# Patient Record
Sex: Male | Born: 1955 | Race: White | Hispanic: No | Marital: Single | Smoking: Former smoker
Health system: Southern US, Community
[De-identification: ages and names within clinical notes are randomized; demographics above are authoritative.]

## PROBLEM LIST (undated history)

## (undated) DIAGNOSIS — IMO0002 Reserved for concepts with insufficient information to code with codable children: Secondary | ICD-10-CM

## (undated) DIAGNOSIS — E079 Disorder of thyroid, unspecified: Secondary | ICD-10-CM

## (undated) DIAGNOSIS — I10 Essential (primary) hypertension: Secondary | ICD-10-CM

## (undated) DIAGNOSIS — F319 Bipolar disorder, unspecified: Secondary | ICD-10-CM

## (undated) DIAGNOSIS — F200 Paranoid schizophrenia: Secondary | ICD-10-CM

## (undated) DIAGNOSIS — H269 Unspecified cataract: Secondary | ICD-10-CM

## (undated) DIAGNOSIS — E785 Hyperlipidemia, unspecified: Secondary | ICD-10-CM

## (undated) DIAGNOSIS — D649 Anemia, unspecified: Secondary | ICD-10-CM

## (undated) DIAGNOSIS — K219 Gastro-esophageal reflux disease without esophagitis: Secondary | ICD-10-CM

## (undated) DIAGNOSIS — R011 Cardiac murmur, unspecified: Secondary | ICD-10-CM

## (undated) DIAGNOSIS — C801 Malignant (primary) neoplasm, unspecified: Secondary | ICD-10-CM

## (undated) DIAGNOSIS — F29 Unspecified psychosis not due to a substance or known physiological condition: Secondary | ICD-10-CM

## (undated) HISTORY — DX: Gastro-esophageal reflux disease without esophagitis: K21.9

## (undated) HISTORY — DX: Anemia, unspecified: D64.9

## (undated) HISTORY — DX: Unspecified cataract: H26.9

## (undated) HISTORY — DX: Disorder of thyroid, unspecified: E07.9

## (undated) HISTORY — DX: Cardiac murmur, unspecified: R01.1

## (undated) HISTORY — DX: Essential (primary) hypertension: I10

## (undated) HISTORY — DX: Hyperlipidemia, unspecified: E78.5

---
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
## 1998-03-25 ENCOUNTER — Encounter: Admission: RE | Admit: 1998-03-25 | Discharge: 1998-03-25 | Payer: Self-pay | Admitting: Hematology and Oncology

## 1998-04-17 ENCOUNTER — Ambulatory Visit (HOSPITAL_COMMUNITY): Admission: RE | Admit: 1998-04-17 | Discharge: 1998-04-17 | Payer: Self-pay | Admitting: Dermatology

## 1998-07-31 ENCOUNTER — Encounter: Admission: RE | Admit: 1998-07-31 | Discharge: 1998-07-31 | Payer: Self-pay | Admitting: Internal Medicine

## 1998-12-19 ENCOUNTER — Encounter: Payer: Self-pay | Admitting: Internal Medicine

## 1998-12-19 ENCOUNTER — Encounter: Admission: RE | Admit: 1998-12-19 | Discharge: 1998-12-19 | Payer: Self-pay | Admitting: Internal Medicine

## 1998-12-19 ENCOUNTER — Ambulatory Visit (HOSPITAL_COMMUNITY): Admission: RE | Admit: 1998-12-19 | Discharge: 1998-12-19 | Payer: Self-pay | Admitting: Internal Medicine

## 1999-01-21 ENCOUNTER — Encounter: Admission: RE | Admit: 1999-01-21 | Discharge: 1999-01-21 | Payer: Self-pay | Admitting: Hematology and Oncology

## 1999-06-24 ENCOUNTER — Encounter: Admission: RE | Admit: 1999-06-24 | Discharge: 1999-06-24 | Payer: Self-pay | Admitting: Internal Medicine

## 1999-07-29 ENCOUNTER — Encounter: Admission: RE | Admit: 1999-07-29 | Discharge: 1999-07-29 | Payer: Self-pay | Admitting: Hematology and Oncology

## 1999-12-14 ENCOUNTER — Encounter: Payer: Self-pay | Admitting: *Deleted

## 1999-12-14 ENCOUNTER — Encounter: Admission: RE | Admit: 1999-12-14 | Discharge: 1999-12-14 | Payer: Self-pay | Admitting: *Deleted

## 1999-12-21 ENCOUNTER — Ambulatory Visit (HOSPITAL_COMMUNITY): Admission: RE | Admit: 1999-12-21 | Discharge: 1999-12-21 | Payer: Self-pay | Admitting: *Deleted

## 1999-12-21 ENCOUNTER — Encounter: Payer: Self-pay | Admitting: *Deleted

## 2000-11-01 ENCOUNTER — Ambulatory Visit (HOSPITAL_COMMUNITY): Admission: RE | Admit: 2000-11-01 | Discharge: 2000-11-01 | Payer: Self-pay | Admitting: Orthopedic Surgery

## 2000-11-01 ENCOUNTER — Encounter: Payer: Self-pay | Admitting: Orthopedic Surgery

## 2002-01-11 ENCOUNTER — Encounter: Admission: RE | Admit: 2002-01-11 | Discharge: 2002-01-11 | Payer: Self-pay | Admitting: *Deleted

## 2002-01-19 ENCOUNTER — Encounter: Admission: RE | Admit: 2002-01-19 | Discharge: 2002-01-19 | Payer: Self-pay | Admitting: *Deleted

## 2002-02-16 ENCOUNTER — Encounter: Admission: RE | Admit: 2002-02-16 | Discharge: 2002-02-16 | Payer: Self-pay | Admitting: *Deleted

## 2002-04-30 ENCOUNTER — Encounter: Admission: RE | Admit: 2002-04-30 | Discharge: 2002-04-30 | Payer: Self-pay | Admitting: *Deleted

## 2002-05-09 ENCOUNTER — Encounter: Payer: Self-pay | Admitting: Internal Medicine

## 2002-05-09 ENCOUNTER — Encounter: Admission: RE | Admit: 2002-05-09 | Discharge: 2002-05-09 | Payer: Self-pay | Admitting: Internal Medicine

## 2002-08-24 ENCOUNTER — Encounter: Admission: RE | Admit: 2002-08-24 | Discharge: 2002-08-24 | Payer: Self-pay | Admitting: *Deleted

## 2002-11-19 ENCOUNTER — Encounter: Admission: RE | Admit: 2002-11-19 | Discharge: 2002-11-19 | Payer: Self-pay | Admitting: *Deleted

## 2003-02-18 ENCOUNTER — Encounter: Admission: RE | Admit: 2003-02-18 | Discharge: 2003-02-18 | Payer: Self-pay | Admitting: *Deleted

## 2003-03-25 ENCOUNTER — Encounter: Admission: RE | Admit: 2003-03-25 | Discharge: 2003-03-25 | Payer: Self-pay | Admitting: *Deleted

## 2003-06-14 ENCOUNTER — Encounter: Admission: RE | Admit: 2003-06-14 | Discharge: 2003-06-14 | Payer: Self-pay | Admitting: *Deleted

## 2003-08-15 ENCOUNTER — Encounter: Admission: RE | Admit: 2003-08-15 | Discharge: 2003-08-15 | Payer: Self-pay | Admitting: *Deleted

## 2003-12-09 ENCOUNTER — Ambulatory Visit (HOSPITAL_COMMUNITY): Admission: RE | Admit: 2003-12-09 | Discharge: 2003-12-09 | Payer: Self-pay | Admitting: Orthopedic Surgery

## 2004-08-31 ENCOUNTER — Ambulatory Visit (HOSPITAL_COMMUNITY): Payer: Self-pay | Admitting: Psychiatry

## 2004-11-23 ENCOUNTER — Emergency Department (HOSPITAL_COMMUNITY): Admission: EM | Admit: 2004-11-23 | Discharge: 2004-11-23 | Payer: Self-pay | Admitting: Emergency Medicine

## 2004-11-24 ENCOUNTER — Ambulatory Visit (HOSPITAL_COMMUNITY): Payer: Self-pay | Admitting: Psychiatry

## 2004-11-30 ENCOUNTER — Ambulatory Visit (HOSPITAL_COMMUNITY): Payer: Self-pay | Admitting: Psychiatry

## 2004-12-14 ENCOUNTER — Ambulatory Visit (HOSPITAL_COMMUNITY): Payer: Self-pay | Admitting: Psychiatry

## 2005-01-08 ENCOUNTER — Ambulatory Visit (HOSPITAL_COMMUNITY): Payer: Self-pay | Admitting: Psychiatry

## 2005-02-08 ENCOUNTER — Ambulatory Visit (HOSPITAL_COMMUNITY): Payer: Self-pay | Admitting: Psychiatry

## 2005-02-15 ENCOUNTER — Encounter: Admission: RE | Admit: 2005-02-15 | Discharge: 2005-02-15 | Payer: Self-pay | Admitting: Internal Medicine

## 2005-04-09 ENCOUNTER — Ambulatory Visit (HOSPITAL_COMMUNITY): Payer: Self-pay | Admitting: Psychiatry

## 2005-06-16 ENCOUNTER — Ambulatory Visit (HOSPITAL_COMMUNITY): Payer: Self-pay | Admitting: Psychiatry

## 2005-06-23 ENCOUNTER — Ambulatory Visit (HOSPITAL_COMMUNITY): Payer: Self-pay | Admitting: Psychiatry

## 2005-07-23 ENCOUNTER — Ambulatory Visit (HOSPITAL_COMMUNITY): Payer: Self-pay | Admitting: Psychiatry

## 2005-08-04 ENCOUNTER — Encounter: Admission: RE | Admit: 2005-08-04 | Discharge: 2005-08-04 | Payer: Self-pay | Admitting: Internal Medicine

## 2005-08-27 ENCOUNTER — Ambulatory Visit (HOSPITAL_COMMUNITY): Payer: Self-pay | Admitting: Psychiatry

## 2005-10-11 ENCOUNTER — Ambulatory Visit (HOSPITAL_COMMUNITY): Payer: Self-pay | Admitting: Psychiatry

## 2005-12-15 ENCOUNTER — Ambulatory Visit (HOSPITAL_COMMUNITY): Payer: Self-pay | Admitting: Psychiatry

## 2006-01-12 ENCOUNTER — Ambulatory Visit (HOSPITAL_COMMUNITY): Payer: Self-pay | Admitting: Psychiatry

## 2006-03-09 ENCOUNTER — Ambulatory Visit (HOSPITAL_COMMUNITY): Payer: Self-pay | Admitting: Psychiatry

## 2006-04-20 ENCOUNTER — Ambulatory Visit (HOSPITAL_COMMUNITY): Payer: Self-pay | Admitting: Psychiatry

## 2006-05-25 ENCOUNTER — Ambulatory Visit (HOSPITAL_COMMUNITY): Payer: Self-pay | Admitting: Psychiatry

## 2006-07-25 ENCOUNTER — Ambulatory Visit (HOSPITAL_COMMUNITY): Payer: Self-pay | Admitting: Psychiatry

## 2006-09-19 ENCOUNTER — Ambulatory Visit (HOSPITAL_COMMUNITY): Payer: Self-pay | Admitting: Psychiatry

## 2006-10-19 ENCOUNTER — Ambulatory Visit (HOSPITAL_COMMUNITY): Payer: Self-pay | Admitting: Psychiatry

## 2006-12-12 ENCOUNTER — Ambulatory Visit (HOSPITAL_COMMUNITY): Payer: Self-pay | Admitting: Psychiatry

## 2007-02-13 ENCOUNTER — Ambulatory Visit (HOSPITAL_COMMUNITY): Payer: Self-pay | Admitting: Psychiatry

## 2007-03-15 ENCOUNTER — Ambulatory Visit (HOSPITAL_COMMUNITY): Payer: Self-pay | Admitting: Psychiatry

## 2007-05-12 ENCOUNTER — Ambulatory Visit (HOSPITAL_COMMUNITY): Payer: Self-pay | Admitting: Psychiatry

## 2007-07-14 ENCOUNTER — Ambulatory Visit (HOSPITAL_COMMUNITY): Payer: Self-pay | Admitting: Psychiatry

## 2007-09-15 ENCOUNTER — Ambulatory Visit (HOSPITAL_COMMUNITY): Payer: Self-pay | Admitting: Psychiatry

## 2007-12-04 ENCOUNTER — Ambulatory Visit (HOSPITAL_COMMUNITY): Payer: Self-pay | Admitting: Psychiatry

## 2008-01-03 ENCOUNTER — Ambulatory Visit (HOSPITAL_COMMUNITY): Payer: Self-pay | Admitting: Psychiatry

## 2008-02-05 ENCOUNTER — Ambulatory Visit (HOSPITAL_COMMUNITY): Payer: Self-pay | Admitting: Psychiatry

## 2008-02-27 ENCOUNTER — Encounter: Admission: RE | Admit: 2008-02-27 | Discharge: 2008-02-27 | Payer: Self-pay | Admitting: Orthopedic Surgery

## 2008-04-01 ENCOUNTER — Ambulatory Visit (HOSPITAL_COMMUNITY): Payer: Self-pay | Admitting: Psychiatry

## 2008-06-03 ENCOUNTER — Ambulatory Visit (HOSPITAL_COMMUNITY): Payer: Self-pay | Admitting: Psychiatry

## 2008-08-07 ENCOUNTER — Ambulatory Visit (HOSPITAL_COMMUNITY): Payer: Self-pay | Admitting: Psychiatry

## 2008-08-21 ENCOUNTER — Ambulatory Visit (HOSPITAL_COMMUNITY): Payer: Self-pay | Admitting: Psychiatry

## 2008-09-16 ENCOUNTER — Ambulatory Visit (HOSPITAL_COMMUNITY): Payer: Self-pay | Admitting: Psychiatry

## 2008-10-16 ENCOUNTER — Ambulatory Visit (HOSPITAL_COMMUNITY): Payer: Self-pay | Admitting: Psychiatry

## 2008-12-16 ENCOUNTER — Ambulatory Visit (HOSPITAL_COMMUNITY): Payer: Self-pay | Admitting: Psychiatry

## 2009-02-10 ENCOUNTER — Ambulatory Visit (HOSPITAL_COMMUNITY): Payer: Self-pay | Admitting: Psychiatry

## 2009-04-09 ENCOUNTER — Ambulatory Visit (HOSPITAL_COMMUNITY): Payer: Self-pay | Admitting: Psychiatry

## 2009-05-14 ENCOUNTER — Ambulatory Visit (HOSPITAL_COMMUNITY): Payer: Self-pay | Admitting: Psychiatry

## 2009-06-13 ENCOUNTER — Ambulatory Visit (HOSPITAL_COMMUNITY): Payer: Self-pay | Admitting: Psychiatry

## 2009-07-07 ENCOUNTER — Encounter: Admission: RE | Admit: 2009-07-07 | Discharge: 2009-07-07 | Payer: Self-pay | Admitting: Orthopedic Surgery

## 2009-08-18 ENCOUNTER — Ambulatory Visit (HOSPITAL_COMMUNITY): Payer: Self-pay | Admitting: Psychiatry

## 2009-10-17 ENCOUNTER — Ambulatory Visit (HOSPITAL_COMMUNITY): Payer: Self-pay | Admitting: Psychiatry

## 2009-12-12 ENCOUNTER — Ambulatory Visit (HOSPITAL_COMMUNITY): Payer: Self-pay | Admitting: Psychiatry

## 2010-02-23 ENCOUNTER — Ambulatory Visit (HOSPITAL_COMMUNITY): Payer: Self-pay | Admitting: Psychiatry

## 2010-04-20 ENCOUNTER — Ambulatory Visit (HOSPITAL_COMMUNITY): Payer: Self-pay | Admitting: Psychiatry

## 2010-04-23 ENCOUNTER — Encounter: Admission: RE | Admit: 2010-04-23 | Discharge: 2010-04-23 | Payer: Self-pay | Admitting: Internal Medicine

## 2010-05-25 ENCOUNTER — Ambulatory Visit: Payer: Self-pay | Admitting: Psychiatry

## 2010-05-25 ENCOUNTER — Other Ambulatory Visit: Payer: Self-pay

## 2010-05-25 ENCOUNTER — Inpatient Hospital Stay (HOSPITAL_COMMUNITY): Admission: AD | Admit: 2010-05-25 | Discharge: 2010-05-29 | Payer: Self-pay | Admitting: Psychiatry

## 2010-06-26 ENCOUNTER — Ambulatory Visit (HOSPITAL_COMMUNITY): Payer: Self-pay | Admitting: Psychiatry

## 2010-08-12 ENCOUNTER — Ambulatory Visit (HOSPITAL_COMMUNITY): Payer: Self-pay | Admitting: Psychiatry

## 2010-08-24 ENCOUNTER — Ambulatory Visit (HOSPITAL_COMMUNITY): Payer: Self-pay | Admitting: Psychiatry

## 2010-11-16 ENCOUNTER — Ambulatory Visit (HOSPITAL_COMMUNITY): Payer: Self-pay | Admitting: Psychiatry

## 2010-12-19 ENCOUNTER — Encounter: Payer: Self-pay | Admitting: Orthopedic Surgery

## 2010-12-20 ENCOUNTER — Encounter: Payer: Self-pay | Admitting: Internal Medicine

## 2010-12-30 ENCOUNTER — Encounter (HOSPITAL_COMMUNITY): Payer: Self-pay | Admitting: Psychiatry

## 2010-12-30 ENCOUNTER — Ambulatory Visit (HOSPITAL_COMMUNITY): Admit: 2010-12-30 | Payer: Self-pay | Admitting: Psychiatry

## 2011-01-05 ENCOUNTER — Encounter (HOSPITAL_COMMUNITY): Payer: Self-pay

## 2011-01-25 ENCOUNTER — Encounter (HOSPITAL_COMMUNITY): Payer: Medicare Other | Admitting: Psychiatry

## 2011-01-25 DIAGNOSIS — F259 Schizoaffective disorder, unspecified: Secondary | ICD-10-CM

## 2011-02-14 LAB — CBC
MCH: 31.6 pg (ref 26.0–34.0)
MCHC: 35.2 g/dL (ref 30.0–36.0)
MCV: 89.8 fL (ref 78.0–100.0)
Platelets: 136 10*3/uL — ABNORMAL LOW (ref 150–400)
RBC: 3.83 MIL/uL — ABNORMAL LOW (ref 4.22–5.81)

## 2011-02-14 LAB — HEPATIC FUNCTION PANEL
ALT: 15 U/L (ref 0–53)
Alkaline Phosphatase: 57 U/L (ref 39–117)
Bilirubin, Direct: 0.1 mg/dL (ref 0.0–0.3)
Indirect Bilirubin: 0.3 mg/dL (ref 0.3–0.9)
Total Protein: 6.9 g/dL (ref 6.0–8.3)

## 2011-02-14 LAB — BASIC METABOLIC PANEL
BUN: 21 mg/dL (ref 6–23)
BUN: 27 mg/dL — ABNORMAL HIGH (ref 6–23)
CO2: 25 mEq/L (ref 19–32)
Calcium: 9.5 mg/dL (ref 8.4–10.5)
Chloride: 110 mEq/L (ref 96–112)
Creatinine, Ser: 2.04 mg/dL — ABNORMAL HIGH (ref 0.4–1.5)
Creatinine, Ser: 2.13 mg/dL — ABNORMAL HIGH (ref 0.4–1.5)
GFR calc Af Amer: 39 mL/min — ABNORMAL LOW (ref >60)
GFR calc Af Amer: 41 mL/min — ABNORMAL LOW (ref >60)
GFR calc non Af Amer: 32 mL/min — ABNORMAL LOW (ref >60)
Glucose, Bld: 95 mg/dL (ref 70–99)
Potassium: 4.3 mEq/L (ref 3.5–5.1)

## 2011-02-14 LAB — DIFFERENTIAL
Basophils Relative: 1 % (ref 0–1)
Eosinophils Absolute: 0.1 10*3/uL (ref 0.0–0.7)
Eosinophils Relative: 1 % (ref 0–5)
Lymphs Abs: 2.1 10*3/uL (ref 0.7–4.0)
Neutrophils Relative %: 62 % (ref 43–77)

## 2011-02-14 LAB — TSH: TSH: 3.791 u[IU]/mL (ref 0.350–4.500)

## 2011-02-14 LAB — VALPROIC ACID LEVEL: Valproic Acid Lvl: 57.6 ug/mL (ref 50.0–100.0)

## 2011-02-14 LAB — RAPID URINE DRUG SCREEN, HOSP PERFORMED
Amphetamines: NOT DETECTED
Barbiturates: NOT DETECTED
Benzodiazepines: NOT DETECTED
Cocaine: NOT DETECTED
Opiates: NOT DETECTED
Tetrahydrocannabinol: NOT DETECTED

## 2011-02-14 LAB — PLATELET COUNT: Platelets: 109 10*3/uL — ABNORMAL LOW (ref 150–400)

## 2011-05-04 ENCOUNTER — Other Ambulatory Visit: Payer: Self-pay | Admitting: Internal Medicine

## 2011-05-04 DIAGNOSIS — R609 Edema, unspecified: Secondary | ICD-10-CM

## 2011-05-05 ENCOUNTER — Ambulatory Visit
Admission: RE | Admit: 2011-05-05 | Discharge: 2011-05-05 | Disposition: A | Discharge status: Home / Self Care | Payer: Medicare Other | Source: Ambulatory Visit | Attending: Internal Medicine | Admitting: Internal Medicine

## 2011-05-05 DIAGNOSIS — R609 Edema, unspecified: Secondary | ICD-10-CM

## 2012-04-08 ENCOUNTER — Emergency Department (HOSPITAL_COMMUNITY)
Admission: EM | Admit: 2012-04-08 | Discharge: 2012-04-08 | Disposition: A | Discharge status: Home / Self Care | Payer: Medicare Other | Attending: Emergency Medicine | Admitting: Emergency Medicine

## 2012-04-08 ENCOUNTER — Encounter (HOSPITAL_COMMUNITY): Payer: Self-pay | Admitting: Emergency Medicine

## 2012-04-08 DIAGNOSIS — I4510 Unspecified right bundle-branch block: Secondary | ICD-10-CM | POA: Insufficient documentation

## 2012-04-08 DIAGNOSIS — Z87891 Personal history of nicotine dependence: Secondary | ICD-10-CM | POA: Insufficient documentation

## 2012-04-08 DIAGNOSIS — I498 Other specified cardiac arrhythmias: Secondary | ICD-10-CM | POA: Insufficient documentation

## 2012-04-08 DIAGNOSIS — R0789 Other chest pain: Secondary | ICD-10-CM | POA: Insufficient documentation

## 2012-04-08 LAB — COMPREHENSIVE METABOLIC PANEL
ALT: 11 U/L (ref 0–53)
AST: 13 U/L (ref 0–37)
Albumin: 3.4 g/dL — ABNORMAL LOW (ref 3.5–5.2)
Alkaline Phosphatase: 67 U/L (ref 39–117)
Potassium: 4 mEq/L (ref 3.5–5.1)
Sodium: 144 mEq/L (ref 135–145)
Total Protein: 6.5 g/dL (ref 6.0–8.3)

## 2012-04-08 LAB — DIFFERENTIAL
Basophils Absolute: 0 10*3/uL (ref 0.0–0.1)
Basophils Relative: 1 % (ref 0–1)
Eosinophils Absolute: 0.2 10*3/uL (ref 0.0–0.7)
Neutro Abs: 3 10*3/uL (ref 1.7–7.7)
Neutrophils Relative %: 53 % (ref 43–77)

## 2012-04-08 LAB — CBC
MCH: 31.1 pg (ref 26.0–34.0)
MCHC: 33.5 g/dL (ref 30.0–36.0)
Platelets: 80 10*3/uL — ABNORMAL LOW (ref 150–400)

## 2012-04-08 LAB — TROPONIN I: Troponin I: <0.3 ng/mL (ref <0.30)

## 2012-04-08 NOTE — ED Notes (Signed)
Discharged with brother as driving, instructions given, NAD at time of the discharge.

## 2012-04-08 NOTE — ED Notes (Signed)
Patient complains of in the center of chest, Denies nausea or vomiting. Skin is warm and dry.

## 2012-04-08 NOTE — Discharge Instructions (Signed)
Chest Pain (Nonspecific) It is often hard to give a specific diagnosis for the cause of chest pain. There is always a chance that your pain could be related to something serious, such as a heart attack or a blood clot in the lungs. You need to follow up with your caregiver for further evaluation. CAUSES   Heartburn.   Pneumonia or bronchitis.   Anxiety or stress.   Inflammation around your heart (pericarditis) or lung (pleuritis or pleurisy).   A blood clot in the lung.   A collapsed lung (pneumothorax). It can develop suddenly on its own (spontaneous pneumothorax) or from injury (trauma) to the chest.   Shingles infection (herpes zoster virus).  The chest wall is composed of bones, muscles, and cartilage. Any of these can be the source of the pain.  The bones can be bruised by injury.   The muscles or cartilage can be strained by coughing or overwork.   The cartilage can be affected by inflammation and become sore (costochondritis).  DIAGNOSIS  Lab tests or other studies, such as X-rays, electrocardiography, stress testing, or cardiac imaging, may be needed to find the cause of your pain.  TREATMENT   Treatment depends on what may be causing your chest pain. Treatment may include:   Acid blockers for heartburn.   Anti-inflammatory medicine.   Pain medicine for inflammatory conditions.   Antibiotics if an infection is present.   You may be advised to change lifestyle habits. This includes stopping smoking and avoiding alcohol, caffeine, and chocolate.   You may be advised to keep your head raised (elevated) when sleeping. This reduces the chance of acid going backward from your stomach into your esophagus.   Most of the time, nonspecific chest pain will improve within 2 to 3 days with rest and mild pain medicine.  HOME CARE INSTRUCTIONS   If antibiotics were prescribed, take your antibiotics as directed. Finish them even if you start to feel better.   For the next few  days, avoid physical activities that bring on chest pain. Continue physical activities as directed.   Do not smoke.   Avoid drinking alcohol.   Only take over-the-counter or prescription medicine for pain, discomfort, or fever as directed by your caregiver.   Follow your caregiver's suggestions for further testing if your chest pain does not go away.   Keep any follow-up appointments you made. If you do not go to an appointment, you could develop lasting (chronic) problems with pain. If there is any problem keeping an appointment, you must call to reschedule.  SEEK MEDICAL CARE IF:   You think you are having problems from the medicine you are taking. Read your medicine instructions carefully.   Your chest pain does not go away, even after treatment.   You develop a rash with blisters on your chest.  SEEK IMMEDIATE MEDICAL CARE IF:   You have increased chest pain or pain that spreads to your arm, neck, jaw, back, or abdomen.   You develop shortness of breath, an increasing cough, or you are coughing up blood.   You have severe back or abdominal pain, feel nauseous, or vomit.   You develop severe weakness, fainting, or chills.   You have a fever.  THIS IS AN EMERGENCY. Do not wait to see if the pain will go away. Get medical help at once. Call your local emergency services (911 in [place] Do not drive yourself to the hospital. MAKE SURE YOU:   Understand these instructions.     Will watch your condition.   Will get help right away if you are not doing well or get worse.  Document Released: 08/25/2005 Document Revised: 11/04/2011 Document Reviewed: 06/20/2008 Exit[employer] Patient Information 2012 [employer]. 

## 2012-04-08 NOTE — ED Provider Notes (Signed)
History     CSN: [id number]  Arrival date & time 04/08/12  1110   First MD Initiated Contact with Patient 04/08/12 1132      Chief Complaint  Patient presents with  . Chest Pain    (Consider location/radiation/quality/duration/timing/severity/associated sxs/prior treatment) HPI Comments: Patient for eval of an episode of chest discomfort that occurred yesterday after eating.  He was sitting at the table and had a 30 minute episode of tightness without radiation, diaphoresis, shortness of breath.  He presents today for this and also because he feels tired all the time.  He is on multiple psych meds for schizophrenia but these have not been changed recently.  Patient is a 56 y.o. male presenting with chest pain. The history is provided by the patient.  Chest Pain The chest pain began yesterday. Duration of episode(s) is 30 minutes. Chest pain occurs constantly. The chest pain is resolved. The pain is associated with eating. The pain is currently at 0/10. The severity of the pain is moderate. The quality of the pain is described as tightness. The pain does not radiate. Chest pain is worsened by eating. Pertinent negatives for primary symptoms include no fever, no shortness of breath, no cough, no nausea and no vomiting. He tried nothing for the symptoms. Risk factors: family history.     No past medical history on file.  Past Surgical History  Procedure Date  . Dermal abrasion 1973    No family history on file.  History  Substance Use Topics  . Smoking status: Former Smoker    Quit date: 04/09/1991  . Smokeless tobacco: Not on file  . Alcohol Use: No      Review of Systems  Constitutional: Negative for fever.  Respiratory: Negative for cough and shortness of breath.   Cardiovascular: Positive for chest pain.  Gastrointestinal: Negative for nausea and vomiting.  All other systems reviewed and are negative.    Allergies  Review of patient's allergies indicates not on  file.  Home Medications  No current outpatient prescriptions on file.  BP 106/89  Pulse 50  Temp(Src) 97.5 F (36.4 C) (Oral)  Resp 16  SpO2 100%  Physical Exam  Nursing note and vitals reviewed. Constitutional: He is oriented to person, place, and time. He appears well-developed and well-nourished. No distress.  HENT:  Head: Normocephalic and atraumatic.  Mouth/Throat: Oropharynx is clear and moist.  Neck: Normal range of motion. Neck supple.  Cardiovascular: Normal rate and regular rhythm.   No murmur heard. Pulmonary/Chest: Effort normal and breath sounds normal. No respiratory distress. He has no wheezes.  Abdominal: Soft. Bowel sounds are normal. He exhibits no distension. There is no tenderness.  Musculoskeletal: Normal range of motion. He exhibits no edema.  Neurological: He is alert and oriented to person, place, and time.  Skin: Skin is warm and dry. He is not diaphoretic.    ED Course  Procedures (including critical care time)  Labs Reviewed - No data to display No results found.   No diagnosis found.   Date: 04/08/2012  Rate: 59  Rhythm: sinus bradycardia  QRS Axis: normal  Intervals: normal  ST/T Wave abnormalities: normal  Conduction Disutrbances:right bundle branch block  Narrative Interpretation:   Old EKG Reviewed: none available    MDM  The labs, ekg, and history are reassuring.  He will be discharged to home, return prn if worsens.          Geoffery Lyons, MD 04/08/12 [phone]

## 2013-10-15 ENCOUNTER — Ambulatory Visit: Payer: Self-pay | Admitting: Podiatry

## 2014-04-16 ENCOUNTER — Ambulatory Visit: Payer: Self-pay | Admitting: Podiatry

## 2014-05-09 ENCOUNTER — Ambulatory Visit: Payer: Self-pay | Admitting: Podiatry

## 2014-06-12 ENCOUNTER — Encounter (HOSPITAL_COMMUNITY): Payer: Self-pay | Admitting: Emergency Medicine

## 2014-06-12 ENCOUNTER — Emergency Department (HOSPITAL_COMMUNITY)
Admission: EM | Admit: 2014-06-12 | Discharge: 2014-06-13 | Disposition: A | Discharge status: Other Acute Inpatient Hospital | Payer: Medicare Other | Source: Home / Self Care | Attending: Emergency Medicine | Admitting: Emergency Medicine

## 2014-06-12 DIAGNOSIS — Z792 Long term (current) use of antibiotics: Secondary | ICD-10-CM | POA: Insufficient documentation

## 2014-06-12 DIAGNOSIS — F200 Paranoid schizophrenia: Secondary | ICD-10-CM | POA: Insufficient documentation

## 2014-06-12 DIAGNOSIS — Z79899 Other long term (current) drug therapy: Secondary | ICD-10-CM | POA: Insufficient documentation

## 2014-06-12 DIAGNOSIS — F919 Conduct disorder, unspecified: Secondary | ICD-10-CM | POA: Insufficient documentation

## 2014-06-12 DIAGNOSIS — Z8739 Personal history of other diseases of the musculoskeletal system and connective tissue: Secondary | ICD-10-CM | POA: Insufficient documentation

## 2014-06-12 DIAGNOSIS — F259 Schizoaffective disorder, unspecified: Secondary | ICD-10-CM | POA: Diagnosis not present

## 2014-06-12 DIAGNOSIS — F29 Unspecified psychosis not due to a substance or known physiological condition: Secondary | ICD-10-CM | POA: Diagnosis present

## 2014-06-12 DIAGNOSIS — Z87891 Personal history of nicotine dependence: Secondary | ICD-10-CM | POA: Insufficient documentation

## 2014-06-12 DIAGNOSIS — F411 Generalized anxiety disorder: Secondary | ICD-10-CM

## 2014-06-12 DIAGNOSIS — IMO0002 Reserved for concepts with insufficient information to code with codable children: Secondary | ICD-10-CM | POA: Insufficient documentation

## 2014-06-12 HISTORY — DX: Reserved for concepts with insufficient information to code with codable children: IMO0002

## 2014-06-12 HISTORY — DX: Paranoid schizophrenia: F20.0

## 2014-06-12 HISTORY — DX: Unspecified psychosis not due to a substance or known physiological condition: F29

## 2014-06-12 LAB — CBC
HCT: 35.4 % — ABNORMAL LOW (ref 39.0–52.0)
HEMOGLOBIN: 11.7 g/dL — AB (ref 13.0–17.0)
MCH: 30.4 pg (ref 26.0–34.0)
MCHC: 33.1 g/dL (ref 30.0–36.0)
MCV: 91.9 fL (ref 78.0–100.0)
Platelets: 102 10*3/uL — ABNORMAL LOW (ref 150–400)
RBC: 3.85 MIL/uL — AB (ref 4.22–5.81)
RDW: 14.6 % (ref 11.5–15.5)
WBC: 4.3 10*3/uL (ref 4.0–10.5)

## 2014-06-12 LAB — RAPID URINE DRUG SCREEN, HOSP PERFORMED
AMPHETAMINES: NOT DETECTED
Barbiturates: NOT DETECTED
Benzodiazepines: NOT DETECTED
Cocaine: NOT DETECTED
OPIATES: NOT DETECTED
Tetrahydrocannabinol: NOT DETECTED

## 2014-06-12 LAB — COMPREHENSIVE METABOLIC PANEL
ALT: 27 U/L (ref 0–53)
ANION GAP: 15 (ref 5–15)
AST: 29 U/L (ref 0–37)
Albumin: 3.6 g/dL (ref 3.5–5.2)
Alkaline Phosphatase: 103 U/L (ref 39–117)
BUN: 16 mg/dL (ref 6–23)
CALCIUM: 9.7 mg/dL (ref 8.4–10.5)
CO2: 18 meq/L — AB (ref 19–32)
CREATININE: 2.38 mg/dL — AB (ref 0.50–1.35)
Chloride: 109 mEq/L (ref 96–112)
GFR, EST AFRICAN AMERICAN: 32 mL/min — AB (ref >90)
GFR, EST NON AFRICAN AMERICAN: 28 mL/min — AB (ref >90)
GLUCOSE: 126 mg/dL — AB (ref 70–99)
Potassium: 4.3 mEq/L (ref 3.7–5.3)
SODIUM: 142 meq/L (ref 137–147)
TOTAL PROTEIN: 7.3 g/dL (ref 6.0–8.3)
Total Bilirubin: 0.3 mg/dL (ref 0.3–1.2)

## 2014-06-12 LAB — AMMONIA: AMMONIA: 50 umol/L (ref 11–60)

## 2014-06-12 LAB — BASIC METABOLIC PANEL
ANION GAP: 11 (ref 5–15)
BUN: 16 mg/dL (ref 6–23)
CO2: 22 mEq/L (ref 19–32)
Calcium: 8.8 mg/dL (ref 8.4–10.5)
Chloride: 112 mEq/L (ref 96–112)
Creatinine, Ser: 2.28 mg/dL — ABNORMAL HIGH (ref 0.50–1.35)
GFR calc Af Amer: 34 mL/min — ABNORMAL LOW (ref >90)
GFR, EST NON AFRICAN AMERICAN: 29 mL/min — AB (ref >90)
Glucose, Bld: 90 mg/dL (ref 70–99)
Potassium: 3.9 mEq/L (ref 3.7–5.3)
SODIUM: 145 meq/L (ref 137–147)

## 2014-06-12 LAB — SALICYLATE LEVEL

## 2014-06-12 LAB — I-STAT CG4 LACTIC ACID, ED
LACTIC ACID, VENOUS: 6.48 mmol/L — AB (ref 0.5–2.2)
LACTIC ACID, VENOUS: 0.73 mmol/L (ref 0.5–2.2)

## 2014-06-12 LAB — VALPROIC ACID LEVEL: VALPROIC ACID LVL: 20.7 ug/mL — AB (ref 50.0–100.0)

## 2014-06-12 LAB — ACETAMINOPHEN LEVEL: Acetaminophen (Tylenol), Serum: <15 ug/mL (ref 10–30)

## 2014-06-12 LAB — ETHANOL

## 2014-06-12 MED ORDER — ZOLPIDEM TARTRATE 5 MG PO TABS: 5.0000 mg | ORAL_TABLET | Freq: Every evening | ORAL | Status: DC | PRN

## 2014-06-12 MED ORDER — FLUOXETINE HCL 20 MG PO TABS
80.0000 mg | ORAL_TABLET | Freq: Every day | ORAL | Status: DC
  Administered 2014-06-13: 80 mg via ORAL
  Filled 2014-06-12: qty 4

## 2014-06-12 MED ORDER — ADULT MULTIVITAMIN W/MINERALS CH
1.0000 | ORAL_TABLET | Freq: Every day | ORAL | Status: DC
  Administered 2014-06-12 – 2014-06-13 (×2): 1 via ORAL
  Filled 2014-06-12 (×2): qty 1

## 2014-06-12 MED ORDER — POLYETHYLENE GLYCOL 3350 17 GM/SCOOP PO POWD
17.0000 g | Freq: Every day | ORAL | Status: DC
  Filled 2014-06-12: qty 255

## 2014-06-12 MED ORDER — ASENAPINE MALEATE 5 MG SL SUBL
5.0000 mg | SUBLINGUAL_TABLET | SUBLINGUAL | Status: DC
  Administered 2014-06-12 – 2014-06-13 (×3): 5 mg via SUBLINGUAL
  Filled 2014-06-12 (×4): qty 1

## 2014-06-12 MED ORDER — DIVALPROEX SODIUM ER 500 MG PO TB24
500.0000 mg | ORAL_TABLET | ORAL | Status: DC
  Administered 2014-06-13: 500 mg via ORAL
  Filled 2014-06-12: qty 1

## 2014-06-12 MED ORDER — POLYETHYLENE GLYCOL 3350 17 G PO PACK
17.0000 g | PACK | Freq: Every day | ORAL | Status: DC
  Filled 2014-06-12: qty 1

## 2014-06-12 MED ORDER — ACETAMINOPHEN 325 MG PO TABS
650.0000 mg | ORAL_TABLET | ORAL | Status: DC | PRN
  Administered 2014-06-12 – 2014-06-13 (×4): 650 mg via ORAL
  Filled 2014-06-12 (×4): qty 2

## 2014-06-12 MED ORDER — FUROSEMIDE 40 MG PO TABS: 20.0000 mg | ORAL_TABLET | Freq: Two times a day (BID) | ORAL | Status: DC

## 2014-06-12 MED ORDER — LEVOTHYROXINE SODIUM 50 MCG PO TABS
50.0000 ug | ORAL_TABLET | Freq: Every day | ORAL | Status: DC
  Administered 2014-06-13: 50 ug via ORAL
  Filled 2014-06-12 (×2): qty 1

## 2014-06-12 MED ORDER — NICOTINE 21 MG/24HR TD PT24: 21.0000 mg | MEDICATED_PATCH | Freq: Every day | TRANSDERMAL | Status: DC

## 2014-06-12 MED ORDER — SPIRONOLACTONE 25 MG PO TABS
25.0000 mg | ORAL_TABLET | Freq: Every day | ORAL | Status: DC
  Administered 2014-06-13: 25 mg via ORAL
  Filled 2014-06-12: qty 1

## 2014-06-12 MED ORDER — FLUOXETINE HCL 20 MG PO TABS
40.0000 mg | ORAL_TABLET | Freq: Every day | ORAL | Status: DC
Start: 2014-06-12 — End: 2014-06-12
  Filled 2014-06-12: qty 2

## 2014-06-12 MED ORDER — SIMVASTATIN 20 MG PO TABS
20.0000 mg | ORAL_TABLET | Freq: Every day | ORAL | Status: DC
  Administered 2014-06-12: 20 mg via ORAL
  Filled 2014-06-12 (×2): qty 1

## 2014-06-12 MED ORDER — SODIUM CHLORIDE 0.9 % IV BOLUS (SEPSIS)
2000.0000 mL | Freq: Once | INTRAVENOUS | Status: AC
  Administered 2014-06-12: 2000 mL via INTRAVENOUS

## 2014-06-12 MED ORDER — LABETALOL HCL 100 MG PO TABS
100.0000 mg | ORAL_TABLET | Freq: Every day | ORAL | Status: DC
  Filled 2014-06-12: qty 1

## 2014-06-12 MED ORDER — DIVALPROEX SODIUM ER 500 MG PO TB24
1000.0000 mg | ORAL_TABLET | Freq: Every day | ORAL | Status: DC
  Administered 2014-06-12: 1000 mg via ORAL
  Filled 2014-06-12 (×2): qty 2

## 2014-06-12 MED ORDER — ONDANSETRON HCL 4 MG PO TABS: 4.0000 mg | ORAL_TABLET | Freq: Three times a day (TID) | ORAL | Status: DC | PRN

## 2014-06-12 MED ORDER — LABETALOL HCL 100 MG PO TABS
100.0000 mg | ORAL_TABLET | Freq: Two times a day (BID) | ORAL | Status: DC
  Administered 2014-06-12 – 2014-06-13 (×2): 100 mg via ORAL
  Filled 2014-06-12 (×3): qty 1

## 2014-06-12 MED ORDER — LATANOPROST 0.005 % OP SOLN
1.0000 [drp] | Freq: Every day | OPHTHALMIC | Status: DC
  Administered 2014-06-12: 1 [drp] via OPHTHALMIC
  Filled 2014-06-12 (×2): qty 2.5
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
## 2014-06-12 NOTE — ED Notes (Signed)
Brandon Maynard, EDP given CG4 Lactic results.

## 2014-06-12 NOTE — ED Notes (Signed)
CG4 Lactic results given to [facility].

## 2014-06-12 NOTE — ED Notes (Signed)
Pt's brothers: Brandon Maynard [phone] Brandon Maynard [phone]  Pt's brother, Brandon Maynard, called ED, states that pt's mother is pt's legal guardian but lives in nursing home and has dementia. Pt recently evicted from home, they are working with social work to find placement in group home for when pt is discharged. Dean Leinberger asked [place] to call him if we have any questions or concerns regarding pt.
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
## 2014-06-12 NOTE — ED Notes (Signed)
Pt has tan pants and a button down shirt
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
## 2014-06-12 NOTE — ED Notes (Signed)
Blood work to be pulled back from IV 30 minutes after fluids ended, per Dr Regenia Skeeter.

## 2014-06-12 NOTE — ED Notes (Signed)
Pt has 1 bag of belongings in locker 27
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
## 2014-06-12 NOTE — ED Notes (Signed)
Pt to go to room 41

## 2014-06-12 NOTE — ED Notes (Signed)
MD at bedside. EDP [place] PRESENT TO EVALUATE PT

## 2014-06-13 ENCOUNTER — Inpatient Hospital Stay (HOSPITAL_COMMUNITY)
Admission: AD | Admit: 2014-06-13 | Discharge: 2014-06-28 | DRG: 885 | Disposition: A | Discharge status: Home / Self Care | Payer: Medicare Other | Source: Intra-hospital | Attending: Psychiatry | Admitting: Psychiatry

## 2014-06-13 ENCOUNTER — Encounter (HOSPITAL_COMMUNITY): Payer: Self-pay | Admitting: General Practice

## 2014-06-13 ENCOUNTER — Encounter (HOSPITAL_COMMUNITY): Payer: Self-pay | Admitting: Registered Nurse

## 2014-06-13 DIAGNOSIS — F250 Schizoaffective disorder, bipolar type: Secondary | ICD-10-CM

## 2014-06-13 DIAGNOSIS — F259 Schizoaffective disorder, unspecified: Principal | ICD-10-CM | POA: Diagnosis present

## 2014-06-13 DIAGNOSIS — F29 Unspecified psychosis not due to a substance or known physiological condition: Secondary | ICD-10-CM | POA: Diagnosis present

## 2014-06-13 DIAGNOSIS — K219 Gastro-esophageal reflux disease without esophagitis: Secondary | ICD-10-CM | POA: Diagnosis present

## 2014-06-13 DIAGNOSIS — Z9119 Patient's noncompliance with other medical treatment and regimen: Secondary | ICD-10-CM | POA: Diagnosis not present

## 2014-06-13 DIAGNOSIS — G4700 Insomnia, unspecified: Secondary | ICD-10-CM | POA: Diagnosis present

## 2014-06-13 DIAGNOSIS — F411 Generalized anxiety disorder: Secondary | ICD-10-CM | POA: Diagnosis present

## 2014-06-13 DIAGNOSIS — F200 Paranoid schizophrenia: Secondary | ICD-10-CM | POA: Diagnosis present

## 2014-06-13 DIAGNOSIS — F319 Bipolar disorder, unspecified: Secondary | ICD-10-CM | POA: Diagnosis present

## 2014-06-13 DIAGNOSIS — Z87891 Personal history of nicotine dependence: Secondary | ICD-10-CM

## 2014-06-13 DIAGNOSIS — Z91199 Patient's noncompliance with other medical treatment and regimen due to unspecified reason: Secondary | ICD-10-CM

## 2014-06-13 HISTORY — DX: Bipolar disorder, unspecified: F31.9

## 2014-06-13 HISTORY — DX: Unspecified psychosis not due to a substance or known physiological condition: F29

## 2014-06-13 MED ORDER — LEVOTHYROXINE SODIUM 50 MCG PO TABS
50.0000 ug | ORAL_TABLET | Freq: Every day | ORAL | Status: DC
  Administered 2014-06-14 – 2014-06-28 (×15): 50 ug via ORAL
  Filled 2014-06-13 (×7): qty 1
  Filled 2014-06-13: qty 2
  Filled 2014-06-13 (×9): qty 1

## 2014-06-13 MED ORDER — LABETALOL HCL 100 MG PO TABS
100.0000 mg | ORAL_TABLET | Freq: Two times a day (BID) | ORAL | Status: DC
  Administered 2014-06-13 – 2014-06-28 (×30): 100 mg via ORAL
  Filled 2014-06-13 (×33): qty 1

## 2014-06-13 MED ORDER — OLANZAPINE 10 MG PO TBDP
ORAL_TABLET | ORAL | Status: AC
  Administered 2014-06-13: 10 mg via ORAL
  Filled 2014-06-13: qty 1

## 2014-06-13 MED ORDER — ALUM & MAG HYDROXIDE-SIMETH 200-200-20 MG/5ML PO SUSP: 30.0000 mL | ORAL | Status: DC | PRN

## 2014-06-13 MED ORDER — DIVALPROEX SODIUM ER 500 MG PO TB24
1000.0000 mg | ORAL_TABLET | Freq: Every day | ORAL | Status: DC
  Filled 2014-06-13 (×3): qty 2

## 2014-06-13 MED ORDER — HALOPERIDOL LACTATE 5 MG/ML IJ SOLN
5.0000 mg | Freq: Four times a day (QID) | INTRAMUSCULAR | Status: DC | PRN
  Filled 2014-06-13: qty 1

## 2014-06-13 MED ORDER — ADULT MULTIVITAMIN W/MINERALS CH
1.0000 | ORAL_TABLET | Freq: Every day | ORAL | Status: DC
  Administered 2014-06-14 – 2014-06-28 (×15): 1 via ORAL
  Filled 2014-06-13 (×16): qty 1

## 2014-06-13 MED ORDER — ZIPRASIDONE MESYLATE 20 MG IM SOLR
20.0000 mg | Freq: Three times a day (TID) | INTRAMUSCULAR | Status: DC | PRN
  Administered 2014-06-17: 20 mg via INTRAMUSCULAR
  Filled 2014-06-13: qty 20

## 2014-06-13 MED ORDER — SIMVASTATIN 20 MG PO TABS
20.0000 mg | ORAL_TABLET | Freq: Every day | ORAL | Status: DC
  Administered 2014-06-14 – 2014-06-27 (×14): 20 mg via ORAL
  Filled 2014-06-13 (×16): qty 1

## 2014-06-13 MED ORDER — SPIRONOLACTONE 25 MG PO TABS
25.0000 mg | ORAL_TABLET | Freq: Every day | ORAL | Status: DC
  Administered 2014-06-14 – 2014-06-28 (×15): 25 mg via ORAL
  Filled 2014-06-13 (×16): qty 1

## 2014-06-13 MED ORDER — NICOTINE 21 MG/24HR TD PT24
21.0000 mg | MEDICATED_PATCH | Freq: Every day | TRANSDERMAL | Status: DC
  Filled 2014-06-13 (×16): qty 1

## 2014-06-13 MED ORDER — MAGNESIUM HYDROXIDE 400 MG/5ML PO SUSP
30.0000 mL | Freq: Every day | ORAL | Status: DC | PRN
  Administered 2014-06-22: 30 mL via ORAL

## 2014-06-13 MED ORDER — TRAZODONE HCL 50 MG PO TABS
50.0000 mg | ORAL_TABLET | Freq: Every evening | ORAL | Status: DC | PRN
  Administered 2014-06-13 – 2014-06-17 (×5): 50 mg via ORAL
  Filled 2014-06-13 (×4): qty 1

## 2014-06-13 MED ORDER — DIVALPROEX SODIUM ER 500 MG PO TB24
500.0000 mg | ORAL_TABLET | ORAL | Status: DC
  Filled 2014-06-13: qty 1

## 2014-06-13 MED ORDER — DIPHENHYDRAMINE HCL 50 MG/ML IJ SOLN
50.0000 mg | Freq: Three times a day (TID) | INTRAMUSCULAR | Status: DC | PRN
  Administered 2014-06-17: 50 mg via INTRAMUSCULAR
  Filled 2014-06-13: qty 1

## 2014-06-13 MED ORDER — ACETAMINOPHEN 325 MG PO TABS
650.0000 mg | ORAL_TABLET | Freq: Four times a day (QID) | ORAL | Status: DC | PRN
  Administered 2014-06-13 – 2014-06-28 (×29): 650 mg via ORAL
  Filled 2014-06-13 (×28): qty 2

## 2014-06-13 MED ORDER — LATANOPROST 0.005 % OP SOLN
1.0000 [drp] | Freq: Every day | OPHTHALMIC | Status: DC
  Administered 2014-06-14 – 2014-06-27 (×11): 1 [drp] via OPHTHALMIC
  Filled 2014-06-13 (×3): qty 2.5

## 2014-06-13 MED ORDER — LORAZEPAM 2 MG/ML IJ SOLN
2.0000 mg | Freq: Four times a day (QID) | INTRAMUSCULAR | Status: DC | PRN
  Filled 2014-06-13: qty 1

## 2014-06-13 MED ORDER — ASENAPINE MALEATE 5 MG SL SUBL
5.0000 mg | SUBLINGUAL_TABLET | SUBLINGUAL | Status: DC
  Administered 2014-06-14 – 2014-06-23 (×19): 5 mg via SUBLINGUAL
  Filled 2014-06-13 (×23): qty 1

## 2014-06-13 MED ORDER — DIVALPROEX SODIUM ER 500 MG PO TB24
500.0000 mg | ORAL_TABLET | Freq: Once | ORAL | Status: AC
  Administered 2014-06-13: 500 mg via ORAL
  Filled 2014-06-13: qty 1

## 2014-06-13 MED ORDER — OLANZAPINE 10 MG PO TBDP
10.0000 mg | ORAL_TABLET | Freq: Three times a day (TID) | ORAL | Status: DC | PRN
  Administered 2014-06-13 – 2014-06-27 (×10): 10 mg via ORAL
  Filled 2014-06-13 (×10): qty 1

## 2014-06-13 MED ORDER — FLUOXETINE HCL 20 MG PO TABS
80.0000 mg | ORAL_TABLET | Freq: Every day | ORAL | Status: DC
  Administered 2014-06-14: 80 mg via ORAL
  Filled 2014-06-13 (×2): qty 4

## 2014-06-13 NOTE — Progress Notes (Signed)
Patient was escorted by [facility] to [facility]. Patient was calm and cooperative at transfer. Patient is stable at transfer. Belongings were sent with [employer].

## 2014-06-13 NOTE — Tx Team (Signed)
Initial Interdisciplinary Treatment Plan  PATIENT STRENGTHS: (choose at least two) General fund of knowledge Supportive family/friends  PATIENT STRESSORS: Legal issue   PROBLEM LIST: Problem List/Patient Goals Date to be addressed Date deferred Reason deferred Estimated date of resolution  Psychosis 06/13/2014                                                      DISCHARGE CRITERIA:  Improved stabilization in mood, thinking, and/or behavior Safe-care adequate arrangements made  PRELIMINARY DISCHARGE PLAN: Outpatient therapy  PATIENT/FAMIILY INVOLVEMENT: This treatment plan has been presented to and reviewed with the patient, Brandon Maynard.  The patient and family have been given the opportunity to ask questions and make suggestions.  Gaylan Gerold E 06/13/2014, 6:58 PM

## 2014-06-13 NOTE — Progress Notes (Signed)
Patient ID: Brandon Maynard, male   DOB: Dec 15, 1955, 58 y.o.   MRN: [id number]  Patient is a 58 year old caucasian male admitted to [facility] IVC for erratic behavior and bizarre behavior. Patient was witnessed throwing furniture and dishes out of his home almost hit other residents. Patient was also heard screaming and disturbing other residents. Patient has a past medical history of Paranoid Schizophrenia and Degenerative Disc Disease. Patient denies SI/HI and A/V hallucinations. Patient's mood is labile but can be redirected. Patient's BP has been elevated but may in some aspects have been altered due to patient's agitation. Patient verbalized understanding of the admission process. Patient had no belongings to go in a locker at this time. Patient was oriented to the unit and was shown where the phone was so he could call his brother. Q15 minutes were initiated and are maintained. Patient is safe at this time.

## 2014-06-13 NOTE — Consult Note (Signed)
  Patient states "I was brought here yesterday.  I am paranoid schizophrenic.  My brother is power of attorney and he is looking for a group home for me.  I have been so frustrated cause no one has called yet.  My plans have been in the air for 4 months."  Patient IVC by apartment manager related to erratic and unpredictable behavior and yelling in his home disturbing other residents.    Agree with TTS assessment Recommend inpatient treatment Patient has been accepted to [name] 402/01 Will monitor for safety and stabilization until transferred. Home medications started.   Patient seen for a face-to-face psychiatric evaluation and examination along with the physician assistant, case discussed and formulated at appropriate treatment plan.Reviewed the information documented and agree with the treatment plan.  Brandon Maynard,Brandon R. 06/13/2014 5:24 PM

## 2014-06-13 NOTE — ED Notes (Signed)
Patient appears restless. Appears to be responding to internal stimuli. Observed pacing room and hallway on unit. Continues to drink water, put water in his hair, and go back and forth to the bathroom. Patient declined Ambien.  Encouragement offered. Given Tylenol.  Q 15 safety checks continue.

## 2014-06-13 NOTE — BHH Counselor (Addendum)
When writer approaches pt to let him know he has been accepted to [facility], pt becomes irate. He screams at Probation officer that "the doctor promised he would come talk to me and my brother" and that pt hasn't seen MD. Pt then yells at writer that he is going to throw his cup of soda on Probation officer. Pt then yells, "You can suck my dick, you damn bitch!". Pt told he cannot speak to staff in that manner. Pt then yelling about not having any shoes. Per pt's RN, moments prior pt had been happy when told he had a bed at [facility].    Per Debarah Crape [facility], pt accepted to bed 402-1.  Arnold Long, [place] Assessment Counselor 3:12 pm

## 2014-06-14 ENCOUNTER — Encounter (HOSPITAL_COMMUNITY): Payer: Self-pay | Admitting: Psychiatry

## 2014-06-14 MED ORDER — DIVALPROEX SODIUM ER 500 MG PO TB24
1000.0000 mg | ORAL_TABLET | Freq: Two times a day (BID) | ORAL | Status: DC
  Administered 2014-06-14 – 2014-06-17 (×7): 1000 mg via ORAL
  Filled 2014-06-14 (×11): qty 2

## 2014-06-14 MED ORDER — FLUOXETINE HCL 20 MG PO TABS
40.0000 mg | ORAL_TABLET | Freq: Every day | ORAL | Status: DC
  Administered 2014-06-15 – 2014-06-28 (×14): 40 mg via ORAL
  Filled 2014-06-14: qty 2
  Filled 2014-06-14: qty 6
  Filled 2014-06-14 (×9): qty 2
  Filled 2014-06-14: qty 6
  Filled 2014-06-14 (×4): qty 2

## 2014-06-14 MED ORDER — PANTOPRAZOLE SODIUM 40 MG PO TBEC
40.0000 mg | DELAYED_RELEASE_TABLET | Freq: Every day | ORAL | Status: DC
  Administered 2014-06-14 – 2014-06-28 (×15): 40 mg via ORAL
  Filled 2014-06-14 (×18): qty 1

## 2014-06-14 MED ORDER — PANTOPRAZOLE SODIUM 40 MG PO TBEC: 40.0000 mg | DELAYED_RELEASE_TABLET | Freq: Every day | ORAL | Status: DC

## 2014-06-14 MED ORDER — TUBERCULIN PPD 5 UNIT/0.1ML ID SOLN
5.0000 [IU] | Freq: Once | INTRADERMAL | Status: AC
  Administered 2014-06-14: 5 [IU] via INTRADERMAL

## 2014-06-14 NOTE — BHH Counselor (Signed)
Adult Comprehensive Assessment  Patient ID: Brandon Maynard, male   DOB: 09-Sep-1956, 58 y.o.   MRN: [id number]  Information Source: Information source: Patient  Current Stressors:  Employment / Job issues: Gets Engineer, building services / Lack of resources (include bankruptcy): Fixed income Housing / Lack of housing: Family is trying to get him into placement  Living/Environment/Situation:  Living Arrangements: Alone Living conditions (as described by patient or guardian): Good, except recently they have not been taking care of the plumbing How long has patient lived in current situation?: 7 years What is atmosphere in current home: Chaotic  Family History:  Marital status: Single Does patient have children?: No  Childhood History:  By whom was/is the patient raised?: Both parents Description of patient's relationship with caregiver when they were a child: good Patient's description of current relationship with people who raised him/her: father deceased,  mother in rehab after a fall  Says she has psychosis Does patient have siblings?: Yes Number of Siblings: 2 Description of patient's current relationship with siblings: good Did patient suffer any verbal/emotional/physical/sexual abuse as a child?: No Did patient suffer from severe childhood neglect?: No Has patient ever been sexually abused/assaulted/raped as an adolescent or adult?: No Was the patient ever a victim of a crime or a disaster?: No Witnessed domestic violence?: No Has patient been effected by domestic violence as an adult?: No  Education:  Highest grade of school patient has completed: 2 years of college Currently a student?: No Learning disability?: No  Employment/Work Situation:   Employment situation: On disability Why is patient on disability: mental health How long has patient been on disability: mid 19's Patient's job has been impacted by current illness: No What is the longest time patient has a held a job?: 5  years 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 Has patient ever been in the [employer]?: No Has patient ever served in Recruitment consultant?: No  Financial Resources:   Museum/gallery curator resources: Teacher, early years/pre Does patient have a Programmer, applications or guardian?: No  Alcohol/Substance Abuse:   Alcohol/Substance Abuse Treatment Hx: Denies past history Has alcohol/substance abuse ever caused legal problems?: No  Social Support System:   Heritage manager System: Fair Astronomer System: brothers Type of faith/religion: "Grew up [employer]"  Leisure/Recreation:   Leisure and Hobbies: Watch movies  Strengths/Needs:   What things does the patient do well?: Watch movies In what areas does patient struggle / problems for patient: "My chemical imbalance"  Discharge Plan:   Does patient have access to transportation?: Yes Will patient be returning to same living situation after discharge?: No Plan for living situation after discharge: Supervised living Currently receiving community mental health services: Yes (From Whom) [facility]) Does patient have financial barriers related to discharge medications?: No Patient description of barriers related to discharge medications:      Summary/Recommendations:   Summary and Recommendations (to be completed by the evaluator): Brandon Maynard is a 58 YO Caucasian male who, by his own admission, has been diagnosed with schizophrenia and has decompensated due to medication non-compliance and stress of moving out of apartment.  He can benefit from crises stabilization, medication management, theraputic milieu and referral for services.  Brandon Lias B. 06/14/2014

## 2014-06-14 NOTE — Progress Notes (Signed)
Pt talkative and cooperative. Pt c/o headache and backache. Pt is active and participates in groups. RN gave Tylenol PRN for pain.  Pt remains safe and appropriate in milieu.

## 2014-06-14 NOTE — BHH Suicide Risk Assessment (Signed)
   Nursing information obtained from:  Patient Demographic factors:  Male;Caucasian;Living alone Current Mental Status:  NA Loss Factors:  NA Historical Factors:  Impulsivity Risk Reduction Factors:  Sense of responsibility to family Total Time spent with patient: 30 minutes  CLINICAL FACTORS:   Severe Anxiety and/or Agitation Bipolar Disorder:   Mixed State Depression:   Aggression Delusional Hopelessness Impulsivity Schizophrenia:   Paranoid or undifferentiated type Currently Psychotic  Psychiatric Specialty Exam: Physical Exam  Psychiatric: His affect is angry and labile. His speech is rapid and/or pressured. He is agitated, aggressive and actively hallucinating. Thought content is paranoid and delusional. Cognition and memory are normal. He expresses impulsivity.    Review of Systems  Constitutional: Negative.   HENT: Negative.   Eyes: Negative.   Respiratory: Negative.   Cardiovascular: Negative.   Gastrointestinal: Negative.   Genitourinary: Negative.   Musculoskeletal: Negative.   Skin: Negative.   Neurological: Negative.   Endo/Heme/Allergies: Negative.   Psychiatric/Behavioral: Positive for hallucinations. The patient is nervous/anxious and has insomnia.     Blood pressure 148/81, pulse 72, temperature 98.2 F (36.8 C), temperature source Oral, resp. rate 20, height 6\' 2"  (1.88 m), weight 108.863 kg (240 lb).Body mass index is 30.8 kg/(m^2).  General Appearance: Disheveled  Eye Sport and exercise psychologist::  Fair  Speech:  Pressured  Volume:  Increased  Mood:  Irritable  Affect:  Labile  Thought Process:  Circumstantial and Disorganized  Orientation:  Full (Time, Place, and Person)  Thought Content:  Paranoid Ideation  Suicidal Thoughts:  No  Homicidal Thoughts:  No  Memory:  Immediate;   Fair Recent;   Fair Remote;   Fair  Judgement:  Impaired  Insight:  Lacking  Psychomotor Activity:  Increased  Concentration:  Fair  Recall:  [employer] of Knowledge:Fair  Language:  Good  Akathisia:  No  Handed:  Right  AIMS (if indicated):     Assets:  Communication Skills Desire for Improvement Physical Health  Sleep:  Number of Hours: 4.75   Musculoskeletal: Strength & Muscle Tone: within normal limits Gait & Station: normal Patient leans: N/A  COGNITIVE FEATURES THAT CONTRIBUTE TO RISK:  Closed-mindedness Polarized thinking    SUICIDE RISK:   Minimal: No identifiable suicidal ideation.  Patients presenting with no risk factors but with morbid ruminations; may be classified as minimal risk based on the severity of the depressive symptoms  PLAN OF CARE:1. Admit for crisis management and stabilization. 2. Medication management to reduce current symptoms to base line and improve the  patient's overall level of functioning 3. Treat health problems as indicated. 4. Develop treatment plan to decrease risk of relapse upon discharge and the need for  readmission. 5. Psycho-social education regarding relapse prevention and self care. 6. Health care follow up as needed for medical problems. 7. Restart home medications where appropriate.   I certify that inpatient services furnished can reasonably be expected to improve the patient's condition.  Corena Pilgrim, MD 06/14/2014, 10:04 AM

## 2014-06-14 NOTE — Progress Notes (Addendum)
D: Pt is flat in affect and labile in mood. Pt was pleasantly polite during our initial interaction. Pt presented disorganized in his thought process.Pt denied any SI/HI/AVH. Pt endorses having anxiety in relation to his experience of being in police custody. Pt became irritate during this writer's attempt to give him his Depakote. Pt was adamant about receiving #2 500 mg of Depakote ER. Pt was informed that he his previous dose was charted for 500 mg and that it could had been #2 250 mg. Pt demanded that I give him the pills in his hand and that he would manage what he is going to take. Pt became verbally aggressive as he demanded for me to leave. On-call Doctor was contacted for the possibility to administer half the dose as requested by the patient. Writer also requested to have something as needed for agitation. A: Writer administered scheduled and prn medications to pt. Continued support and availability as needed was extended to this pt. Staff continue to monitor pt with q21min checks.  R: No adverse drug reactions noted. Pt willingly received 500 mg of Depakote and 10 mg of ZYDIS, that was administered by a male Diplomatic Services operational officer. No further of aggression resulted from this pt. Pt remains safe at this time.

## 2014-06-14 NOTE — BHH Group Notes (Signed)
[facility] LCSW Aftercare Discharge Planning Group Note   06/14/2014 12:34 PM  Participation Quality:  Did not attend    [place]

## 2014-06-14 NOTE — BHH Group Notes (Signed)
[facility] LCSW Group Therapy  06/14/2014  1:05 PM  Type of Therapy:  Group therapy  Participation Level:  Active  Participation Quality:  Attentive  Affect:  Flat  Cognitive:  Oriented  Insight:  Limited  Engagement in Therapy:  Limited  Modes of Intervention:  Discussion, Socialization  Summary of Progress/Problems:  Chaplain was here to lead a group on themes of hope and courage.  "I have a cousin named Rod.  Haven't seen him for 40 years.  Who si this new staff?  Will you introduce him to me?"  After an initial barrage, we were able to get Brandon Maynard to take a breath and back off.  "Courage is fortitude, Air traffic controller.  Animals are loyal if you don't mistreat them.  My brother used to have a hound dog." "I'm a simple man with simple needs.  I hope to talk to my brothers.  Eat some food.  Enjoy the sunset." [name] 06/14/2014 12:34 PM

## 2014-06-14 NOTE — Progress Notes (Signed)
[facility] Group Notes:  (Nursing/MHT/Case Management/Adjunct)  Date:  06/14/2014  Time:  9:15 PM  Type of Therapy:  Group Therapy  Participation Level:  Active  Participation Quality:  Intrusive, Redirectable and Sharing  Affect:  Appropriate, Excited and Irritable  Cognitive:  Alert  Insight:  Improving  Engagement in Group:  Developing/Improving, Engaged and Off Topic  Modes of Intervention:  Socialization and Support  Summary of Progress/Problems: Pt. Rated his energy level a 5.  Pt. Stated lack of sleep is an early warning sign for relapse.  Pt. Listed listening to music as a coping skill.  Pt. Stated his brother as a strong part of his support system.  Lanell Persons 06/14/2014, 9:15 PM

## 2014-06-14 NOTE — Progress Notes (Signed)
5 unit intradermal tuberculin injection administered in left forearm at 1709 per order. To be read on 06/16/14 at 1709. Patient advised that test was requested by group home prior to patient's discharge from [facility].

## 2014-06-14 NOTE — Tx Team (Signed)
  Interdisciplinary Treatment Plan Update   Date Reviewed:  06/14/2014  Time Reviewed:  8:11 AM  Progress in Treatment:   Attending groups: Yes Participating in groups: Yes Taking medication as prescribed: Yes  Tolerating medication: Yes Family/Significant other contact made: No  Patient understands diagnosis: Yes AEB asking for help with symptoms of schizophrenia Discussing patient identified problems/goals with staff: Yes See initial care plan Medical problems stabilized or resolved: Yes Denies suicidal/homicidal ideation: Yes In tx team Patient has not harmed self or others: Yes  For review of initial/current patient goals, please see plan of care.  Estimated Length of Stay:  4-5 days  Reason for Continuation of Hospitalization: Mania Medication stabilization Other; describe Mood lability  New Problems/Goals identified:  N/A  Discharge Plan or Barriers:   return home, follow up [facility]  Additional Comments:  Brandon Maynard is an 58 y.o. male. Patient was brought into the ED by [employer] under IVC initated by his apartment manager. According to the IVC the patient has become more erratic and unpredictable. He been yelling in his home daily disturbing other residents. Today, he was witnessed throwing dishware and furniture out of his apartment almost hitting other residents. Patient admits to the previously stated behaviors as an outburst because of him being overwhelmed. Patient reports that his brothers have decided to put him in a group because his ability to live on his own has changed. Patient currently denies SI/HI, hallucinations, or other self-injurious behaviors.    Attendees:  Signature: Corena Pilgrim, MD 06/14/2014 8:11 AM   Signature: Ripley Fraise, LCSW 06/14/2014 8:11 AM  Signature: Elmarie Shiley, NP 06/14/2014 8:11 AM  Signature: Mayra Neer, RN 06/14/2014 8:11 AM  Signature: Darrol Angel, RN 06/14/2014 8:11 AM  Signature:  06/14/2014 8:11 AM  Signature:   06/14/2014 8:11 AM   Signature:    Signature:    Signature:    Signature:    Signature:    Signature:      Scribe for Treatment Team:   Ripley Fraise, LCSW  06/14/2014 8:11 AM
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
## 2014-06-15 NOTE — Progress Notes (Addendum)
[facility] MD Progress Note  06/15/2014 10:48 AM Brandon Maynard  MRN:  [id number] Subjective:  Patient states he is feeling better Objective: Patient seen with RN, chart reviewed. Patient is a 58 year old man, who was admitted on IVC yesterday, with a report that he has agitated, disorganized in behavior, and becoming unpredictable, yelling, so that  [place] and Radiographer, therapeutic were concerned. Family reports that medication compliance is intermittent. Patient has a history of paranoid schizophrenia. Today, patient states he is feeling better, and states he slept well last night. On unit  He has been calm, generally pleasant, not agitated. He has been going to groups, at times somewhat intrusive in groups, as per notes, but redirectable and pleasant. At this time denies any medication side effects    Diagnosis:  Schizoaffective disorder, bipolar type    Total Time spent with patient: 20 minutes   ADL's:  Fair   Sleep: Improving  Appetite: Fair   Suicidal Ideation:  Denies  Homicidal Ideation:  Denies  AEB (as evidenced by):  Psychiatric Specialty Exam: Physical Exam  ROS  Blood pressure 120/69, pulse 77, temperature 98.2 F (36.8 C), temperature source Oral, resp. rate 18, height 6\' 2"  (1.88 m), weight 108.863 kg (240 lb).Body mass index is 30.8 kg/(m^2).  General Appearance: Fairly Groomed  Engineer, water::  Good  Speech:  Normal Rate  Volume:  Normal  Mood:  "OK", denies significant depression  Affect:  Appropriate  Thought Process:  Disorganized, subtly tangential, disorganized at times   Orientation:  NA- alert and attentive  Thought Content:  Currently is denying any hallucinations, and at this time does not appear internally preoccupied   Suicidal Thoughts:  No- at this time denies any suicidal ideations  Homicidal Thoughts:  No  Memory:  NA  Judgement:  Fair  Insight:  Lacking  Psychomotor Activity:  Normal  Concentration:  Good  Recall:  Good  Fund of Knowledge:Good   Language: Good  Akathisia:  Negative  Handed:  Right  AIMS (if indicated):     Assets:  Communication Skills Desire for Improvement Resilience  Sleep:  Number of Hours: 6   Musculoskeletal: Strength & Muscle Tone: within normal limits Gait & Station: normal Patient leans: N/A  Current Medications: Current Facility-Administered Medications  Medication Dose Route Frequency Provider Last Rate Last Dose  . acetaminophen (TYLENOL) tablet 650 mg  650 mg Oral Q6H PRN Mojeed Akintayo   650 mg at 06/14/14 2242  . alum & mag hydroxide-simeth (MAALOX/MYLANTA) 200-200-20 MG/5ML suspension 30 mL  30 mL Oral Q4H PRN Shuvon Rankin, NP      . asenapine (SAPHRIS) sublingual tablet 5 mg  5 mg Sublingual 2 times per day Shuvon Rankin, NP   5 mg at 06/15/14 0807  . diphenhydrAMINE (BENADRYL) injection 50 mg  50 mg Intramuscular Q8H PRN Mojeed Akintayo      . divalproex (DEPAKOTE ER) 24 hr tablet 1,000 mg  1,000 mg Oral BID PC Mojeed Akintayo   1,000 mg at 06/15/14 0807  . FLUoxetine (PROZAC) tablet 40 mg  40 mg Oral Daily Mojeed Akintayo   40 mg at 06/15/14 0807  . labetalol (NORMODYNE) tablet 100 mg  100 mg Oral BID Shuvon Rankin, NP   100 mg at 06/15/14 0808  . latanoprost (XALATAN) 0.005 % ophthalmic solution 1 drop  1 drop Both Eyes QHS Shuvon Rankin, NP   1 drop at 06/14/14 2244  . levothyroxine (SYNTHROID, LEVOTHROID) tablet 50 mcg  50 mcg Oral QAC breakfast Shuvon  Rankin, NP   50 mcg at 06/15/14 [phone]  . magnesium hydroxide (MILK OF MAGNESIA) suspension 30 mL  30 mL Oral Daily PRN Shuvon Rankin, NP      . multivitamin with minerals tablet 1 tablet  1 tablet Oral Daily Shuvon Rankin, NP   1 tablet at 06/15/14 0807  . nicotine (NICODERM CQ - dosed in mg/24 hours) patch 21 mg  21 mg Transdermal Daily Shuvon Rankin, NP      . OLANZapine zydis (ZYPREXA) disintegrating tablet 10 mg  10 mg Oral Q8H PRN Mojeed Akintayo   10 mg at 06/14/14 2243  . pantoprazole (PROTONIX) EC tablet 40 mg  40 mg Oral Daily  Mojeed Akintayo   40 mg at 06/15/14 0806  . simvastatin (ZOCOR) tablet 20 mg  20 mg Oral q1800 Shuvon Rankin, NP   20 mg at 06/14/14 1836  . spironolactone (ALDACTONE) tablet 25 mg  25 mg Oral Daily Shuvon Rankin, NP   25 mg at 06/15/14 0808  . traZODone (DESYREL) tablet 50 mg  50 mg Oral QHS PRN Shuvon Rankin, NP   50 mg at 06/14/14 2234  . tuberculin injection 5 Units  5 Units Intradermal Once Elmarie Shiley, NP   5 Units at 06/14/14 1709  . ziprasidone (GEODON) injection 20 mg  20 mg Intramuscular Q8H PRN Mojeed Akintayo        Lab Results: No results found for this or any previous visit (from the past 63 hour(s)).  Physical Findings: AIMS: Facial and Oral Movements Muscles of Facial Expression: None, normal Lips and Perioral Area: None, normal Jaw: None, normal Tongue: None, normal,Extremity Movements Upper (arms, wrists, hands, fingers): None, normal Lower (legs, knees, ankles, toes): None, normal, Trunk Movements Neck, shoulders, hips: None, normal, Overall Severity Severity of abnormal movements (highest score from questions above): None, normal Incapacitation due to abnormal movements: None, normal Patient's awareness of abnormal movements (rate only patient's report): No Awareness, Dental Status Current problems with teeth and/or dentures?: No Does patient usually wear dentures?: No  CIWA:    COWS:     Assessment: Patient's behavior on unit generally calm and pleasant thus far. No overt psychotic symptoms, but does present somewhat disorganized in thought process and insight into events that resulted in his admission seems limited. Tolerating medications well, except for mild sedation- currently fully alert and attentive. He is on Saphris, Prozac and Depakote ER.  Treatment Plan Summary: Daily contact with patient to assess and evaluate symptoms and progress in treatment Medication management See below  Plan: Continue inpatient treatment/support and milieu Continue  medications as above- Saphris 5 mgrs SL BID, Prozac 40 mgrs QDAY, Depakote ER 1000 mgrs BID Will follow Will request hospitalist consult due to  Elevated creatinine.   Medical Decision Making Problem Points:  Established problem, stable/improving (1), Review of last therapy session (1) and Review of psycho-social stressors (1) Data Points:  Review or order clinical lab tests (1) Review of medication regiment & side effects (2)  I certify that inpatient services furnished can reasonably be expected to improve the patient's condition.   COBOS, FERNANDO 06/15/2014, 10:48 AM

## 2014-06-15 NOTE — Progress Notes (Signed)
Patient ID: Brandon Maynard, male   DOB: Nov 07, 1956, 58 y.o.   MRN: [id number] Brandon Maynard presents with euphoric mood, affect labile. Brandon Maynard presents with tangential speech, and disorganized thoughts. He states to Probation officer '' I'm doing good, I slept good and when she came in the room to wake me for breakfast I was startled, I had three cups of orange juice down at the cafeteria, that was the best part. I'm doing good. Been taking the pills a long time. Hey do you think I can get a candy bar? Do you remember the song the three little indians? '' A. Medications given as ordered. Support and encouragement provided. Discussed above information with Dr. Parke Poisson. R. Patient has been visible in the milieu, and able to be redirected verbally from staff. He denies any acute concerns at this time, and appears in no acute distress. Will continue to monitor q 15 minutes for safety.

## 2014-06-15 NOTE — [facility] Group Notes (Signed)
[facility] Group Notes:  (Nursing/MHT/Case Management/Adjunct)  Date:  06/15/2014  Time:  10:05AM  Type of Therapy:  Psychoeducational Skills  Participation Level:  Active  Participation Quality:  Intrusive  Affect:  Anxious  Cognitive:  Disorganized and tangential  Insight:  Limited  Engagement in Group:  Distracting and Off Topic  Modes of Intervention:  Discussion and Education  Summary of Progress/Problems:pt attended group, but was very tangential and disruptive. Pt got off topic on several occasions. Pt able to be redirected.   Mindi Slicker M 06/15/2014, 11:48 AM

## 2014-06-15 NOTE — Progress Notes (Signed)
Adult Psychoeducational Group Note  Date:  06/15/2014 Time:  10:58 PM  Group Topic/Focus:  Goals Group:   The focus of this group is to help patients establish daily goals to achieve during treatment and discuss how the patient can incorporate goal setting into their daily lives to aide in recovery.  Participation Level:  Active  Participation Quality:  Appropriate  Affect:  Appropriate  Cognitive:  Appropriate  Insight: Appropriate  Engagement in Group:  Engaged  Modes of Intervention:  Discussion  Additional Comments:  Pt stated that he is excited about his DC and glad he has housing set up for when he leaves.  Alexis Goodell R 06/15/2014, 10:58 PM

## 2014-06-15 NOTE — BHH Group Notes (Signed)
[facility] Group Notes:  (Nursing/MHT/Case Management/Adjunct)  Date:  06/15/2014  Time:  9:50AM  Type of Therapy:  Self Inventory   Participation Level:  Active  Participation Quality:  Intrusive  Affect:  Anxious  Cognitive:  Disorganized and tangential  Insight:  Limited  Engagement in Group:  Distracting  Modes of Intervention:  Discussion  Summary of Progress/Problems: pt very tangential and disruptive in group. Pt thoughts all over the place and unable to stay on topic  Tretha Sciara 06/15/2014, 11:46 AM
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
## 2014-06-16 MED ORDER — OLANZAPINE 10 MG PO TBDP
ORAL_TABLET | ORAL | Status: AC
  Administered 2014-06-16: 20:00:00
  Filled 2014-06-16: qty 1

## 2014-06-16 MED ORDER — OLANZAPINE 10 MG PO TBDP
ORAL_TABLET | ORAL | Status: AC
  Filled 2014-06-16: qty 1

## 2014-06-16 MED ORDER — DOCUSATE SODIUM 100 MG PO CAPS
100.0000 mg | ORAL_CAPSULE | Freq: Two times a day (BID) | ORAL | Status: DC
  Administered 2014-06-16 – 2014-06-28 (×24): 100 mg via ORAL
  Filled 2014-06-16 (×27): qty 1

## 2014-06-16 NOTE — BHH Group Notes (Signed)
[facility] Group Notes:  (Clinical Social Work)  06/16/2014   11:15am-12:00pm  Summary of Progress/Problems:  The main focus of today's process group was to listen to a variety of genres of music and to identify that different types of music provoke different responses.  The patient then was able to identify personally what was soothing for them, as well as energizing.  Handouts were used to record feelings evoked, as well as how patient can personally use this knowledge in sleep habits, with depression, and with other symptoms.  The patient expressed understanding of concepts, as well as knowledge of how each type of music affected him/her and how this can be used at home as a wellness/recovery tool.      Type of Therapy:  Music Therapy   Participation Level:  Active  Participation Quality:  Attentive and Sharing  Affect:  Blunted  Cognitive:  Oriented  Insight:  Improving  Engagement in Therapy:  Improving  Modes of Intervention:   Activity, Exploration  Selmer Dominion, LCSW 06/16/2014, 12:30pm

## 2014-06-16 NOTE — Plan of Care (Signed)
Problem: Ineffective individual coping Goal: STG: Patient will remain free from self harm Outcome: Progressing Patient denying SI and has not engaged in any self harm while on unit.  Problem: Alteration in thought process Goal: STG-Patient does not respond to command hallucinations Outcome: Progressing Patient denies AVH at present and does not appear on observation to respond to internal stimulation.

## 2014-06-16 NOTE — Progress Notes (Signed)
Did not attend group 

## 2014-06-16 NOTE — Progress Notes (Signed)
Patient presents with tangential, pressured speech. Disheveled and disorganized. Complaining of back pain rated at a 4/10. Asking if he can have his saphris prn. "Sometimes I want it twice a day, sometimes three times a day. Should I ask them for a third dose of it at bed time? I tell you what, go ahead and give me an extra dose." Explained to patient his current orders and suggested he speak with provider tomorrow if questions remain regarding meds. Pt given support and encouragement. Given tylenol 650mg  and trazadone po prn. On reassess, pt was asleep. He denied SI/HI/AVH and remains safe. Jamie Kato
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
## 2014-06-16 NOTE — Progress Notes (Signed)
Patient ID: Brandon Maynard, male   DOB: Jan 22, 1956, 58 y.o.   MRN: [id number] 06/16/14 at 1700- PPD is negative with 0 mm induration.

## 2014-06-16 NOTE — Progress Notes (Signed)
Patient ID: [name], male   DOB: [birth year], 58 y.o.   MRN: [id number] Brandon Maynard presents with euphoric mood, affect congruent again today. [name] continues to have very tangential thoughts, with flight of ideas at times, but he is able to be easily redirected.  He states to Probation officer this morning '' Oh yeah I'm doing fine, I've been up and had a breakfast and you know I have these neighbors at my apartment -she moved in when she was pregnant and they have a little kid named dexter now. He is about 43 old. Do you have kids ? Oh yeah and I had a little yorkie one time , he was a yippie dog very protective. ''' A. Medications given as ordered. Support and encouragement provided. Discussed above information with Dr. Parke Poisson. R. Patient has been visible in the milieu, and able to be redirected verbally from staff. He denies any acute concerns at this time, and appears in no acute distress. Will continue to monitor q 15 minutes for safety.

## 2014-06-17 DIAGNOSIS — F319 Bipolar disorder, unspecified: Secondary | ICD-10-CM
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
## 2014-06-17 NOTE — B[facility] Group Notes (Signed)
[facility] LCSW Aftercare Discharge Planning Group Note   06/17/2014 10:39 AM  Participation Quality:  Engaged  Mood/Affect:  Grumpy  Depression Rating:    Anxiety Rating:    Thoughts of Suicide:  No Will you contract for safety?   NA  Current AVH:  No  Plan for Discharge/Comments:  States he woke up early this morning, "and they gave me 2 Ativan shots to go back to sleep."  This was in response to my question about his sleep.  States he has not spoken directly to his brother, but has left multiple messages, and brother has been bringing clothes for him.  Transportation Means:   Supports:  [name]
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
## 2014-06-17 NOTE — Progress Notes (Signed)
Writer observed patient up in the hallway talking to the MHT on the hall and he appeared upset and was yelling at him about the sun not coming up yet and he blamed him for stopping it from coming up. Writer walked out on the hall and asked if he needed something and he yelled at Probation officer and Press photographer a Social worker and told me to get back in there. Writer spoke with co-workers and meds were drawn up and a show of support went to his room. M. Tommi Rumps RN along with M.Phillips RN  explained to him the use of the medicines and how it would be helpful to calm him and allow him to get more rest. Patient agreed to take medicines. Writer had M. Tommi Rumps RN to administer medications d/t patient being upset with Probation officer. Writer will reassess the effectiveness of medications given. Safety maintained on unit with 15 min checks.
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
## 2014-06-17 NOTE — Progress Notes (Signed)
The focus of this group is to help patients review their daily goal of treatment and discuss progress on daily workbooks. Pt attended the evening group session and responded to discussion prompts from the [place], albeit not always on-topic. Pt told the group that his brother had called from [place] today, but that he was unable to talk to him due to the call being disconnected. He did say he felt supported simply by his brother reaching out to him and that they might be able to talk tomorrow. Pt told the group that his brothers wanted him to move into a group home, which he did not want, but understood that it was probably his best option. Pt's affect was flat and he appeared distracted for much of the group.

## 2014-06-17 NOTE — BHH Group Notes (Signed)
[place] LCSW Group Therapy  06/17/2014 1:15 pm  Type of Therapy: Process Group Therapy  Participation Level:  Active  Participation Quality:  Appropriate  Affect:  Flat  Cognitive:  Oriented  Insight:  Improving  Engagement in Group:  Limited  Engagement in Therapy:  Limited  Modes of Intervention:  Activity, Clarification, Education, Problem-solving and Support  Summary of Progress/Problems: Today's group addressed the issue of overcoming obstacles.  Patients were asked to identify their biggest obstacle post d/c that stands in the way of their on-going success, and then problem solve as to how to manage this.  Brandon Maynard was minimally engaged.  He tried giving another person feedback, but was shut down immediately as he admitted that he does not currently use drugs or alcohol.  He stated his biggest challenge is getting into a group home.  "I have been trying to 5 months.  I don't know why it is taking so long."  Brandon Maynard B 06/17/2014   3:21 PM

## 2014-06-18 MED ORDER — AMPICILLIN 500 MG PO CAPS
500.0000 mg | ORAL_CAPSULE | Freq: Every day | ORAL | Status: DC
  Administered 2014-06-19 – 2014-06-28 (×10): 500 mg via ORAL
  Filled 2014-06-18 (×11): qty 1

## 2014-06-18 MED ORDER — DIVALPROEX SODIUM ER 500 MG PO TB24
500.0000 mg | ORAL_TABLET | Freq: Two times a day (BID) | ORAL | Status: DC
  Administered 2014-06-18 – 2014-06-28 (×18): 500 mg via ORAL
  Filled 2014-06-18: qty 6
  Filled 2014-06-18 (×4): qty 1
  Filled 2014-06-18: qty 6
  Filled 2014-06-18 (×8): qty 1
  Filled 2014-06-18: qty 6
  Filled 2014-06-18 (×3): qty 1
  Filled 2014-06-18 (×2): qty 6
  Filled 2014-06-18 (×5): qty 1

## 2014-06-18 MED ORDER — TRAZODONE HCL 100 MG PO TABS
100.0000 mg | ORAL_TABLET | Freq: Every day | ORAL | Status: DC
  Administered 2014-06-18: 100 mg via ORAL
  Filled 2014-06-18 (×2): qty 1

## 2014-06-18 MED ORDER — DIVALPROEX SODIUM ER 500 MG PO TB24
1000.0000 mg | ORAL_TABLET | Freq: Every day | ORAL | Status: DC
Start: 2014-06-18 — End: 2014-06-28
  Administered 2014-06-18 – 2014-06-27 (×10): 1000 mg via ORAL
  Filled 2014-06-18: qty 6
  Filled 2014-06-18 (×6): qty 2
  Filled 2014-06-18: qty 6
  Filled 2014-06-18 (×4): qty 2

## 2014-06-18 MED ORDER — DIVALPROEX SODIUM ER 500 MG PO TB24
1500.0000 mg | ORAL_TABLET | Freq: Every day | ORAL | Status: DC
  Filled 2014-06-18: qty 3

## 2014-06-18 NOTE — BHH Group Notes (Signed)
[place] LCSW Group Therapy  06/18/2014 , 12:32 PM   Type of Therapy:  Group Therapy  Participation Level:  Active  Participation Quality:  Attentive  Affect:  Appropriate  Cognitive:  Alert  Insight:  Improving  Engagement in Therapy:  Engaged  Modes of Intervention:  Discussion, Exploration and Socialization  Summary of Progress/Problems: Today's group focused on the term Diagnosis.  Participants were asked to define the term, and then pronounce whether it is a negative, positive or neutral term.  Dewayne stayed for the entire group.  He was not disruptive as he had been in previous groups, but he was also minimally involved.  Had nothing to contribute either spontaneously, or when he was asked directly.  [name] 06/18/2014 , 12:32 PM

## 2014-06-18 NOTE — Progress Notes (Signed)
Pt denies SI/HI/AVH. Pt presents with disorganized thoughts and tangential speech. Pt appears disheveled. Pt mood is inconsistent. Pt have episodes of becoming easily agitated, irritable and verbally aggressive. Thus far, pt has been cooperative. Pt voiced concerns of having his meds changed and requested to take a home (ampicillin). Ampicillin ordered per MD. Pt compliant with taking meds and attending groups. Medications administered as ordered per MD. Verbal support given. Pt encouraged to attend groups. 15 minute checks performed for safety. Pt safety maintained at this time.

## 2014-06-18 NOTE — Progress Notes (Signed)
Adult Psychoeducational Group Note  Date:  06/18/2014 Time:  9:01 PM  Group Topic/Focus:  Wrap-Up Group:   The focus of this group is to help patients review their daily goal of treatment and discuss progress on daily workbooks.  Participation Level:  Active  Participation Quality:  Appropriate  Affect:  Excited  Cognitive:  Appropriate  Insight: Improving  Engagement in Group:  Engaged  Modes of Intervention:  Socialization and Support  Additional Comments:  Patient attended and participated in group tonight. He reports that he talked with his SW today about the possibility of getting into an Assisted Living facility or a group home. The Social Worker is inquiring about that for him. Today her also attended his groups and went for his meals.  Salley Scarlet [facility] 06/18/2014, 9:01 PM

## 2014-06-18 NOTE — Progress Notes (Addendum)
Patient ID: Brandon Maynard, male   DOB: 1956-03-05, 58 y.o.   MRN: [id number] [facility] MD Progress Note  06/18/2014 10:46 AM Brandon Maynard  MRN:  [id number]  Subjective: Patient report: " My depakote needs to be done right. Also I have been having trouble sleeping at night.''   Objective: Patient seen and chart reviewed. Patient reports difficulty sleeping because he believes his depakote is not prescribe the way he used to take it before admission. Patient reports decreased agitation, mood lability but his behavior has been unpredictable. He could be defiant and demanding. Patient remains paranoid, he is fixated on people not caring much about him and worry why he has to be made to continue taking medications. He states that he is tired of taking medications, state he had his first break in 1975. Patient has been compliant with his medications so far and has not verbalized any adverse reactions.  Diagnosis:  Schizoaffective disorder, bipolar type  Total Time spent with patient: 25 minutes    ADL's:  Fair   Sleep: Improving  Appetite: Fair   Suicidal Ideation:  Denies  Homicidal Ideation:  Denies  AEB (as evidenced by):  Psychiatric Specialty Exam: Physical Exam  Psychiatric: His mood appears anxious. His affect is angry and labile. His speech is rapid and/or pressured. He is agitated and aggressive. Thought content is paranoid. He expresses impulsivity.    Review of Systems  Constitutional: Negative.  Negative for fever and chills.  HENT: Negative.   Eyes: Negative.   Respiratory: Negative.  Negative for cough and shortness of breath.   Cardiovascular: Negative.  Negative for chest pain.  Gastrointestinal: Negative.   Genitourinary: Negative.   Musculoskeletal: Negative.   Skin: Negative.   Neurological: Positive for tremors.  Endo/Heme/Allergies: Negative.   Psychiatric/Behavioral: Positive for depression.    Blood pressure 133/79, pulse 91, temperature 97.5 F (36.4 C), temperature  source Oral, resp. rate 20, height 6\' 2"  (1.88 m), weight 108.863 kg (240 lb).Body mass index is 30.8 kg/(m^2).  General Appearance: Fairly Groomed  Engineer, water::  Good  Speech:  Normal Rate  Volume:  Normal  Mood:  irritable  Affect:   somewhat labile.   Thought Process: As above, remains disorganized in thought process but improving gradually  Orientation:  NA  Thought Content:  Currently is denying any hallucinations, and at this time does not appear internally preoccupied   Suicidal Thoughts:  No-   Homicidal Thoughts:  No  Memory:  NA  Judgement:  Fair  Insight:  Lacking  Psychomotor Activity:  Normal  Concentration:  Good  Recall:  Good  Fund of Knowledge:Good  Language: Good  Akathisia:  Negative  Handed:  Right  AIMS (if indicated):     Assets:  Communication Skills Desire for Improvement Resilience  Sleep:  Number of Hours: 4   Musculoskeletal: Strength & Muscle Tone: within normal limits Gait & Station: normal Patient leans: N/A  Current Medications: Current Facility-Administered Medications  Medication Dose Route Frequency Provider Last Rate Last Dose  . acetaminophen (TYLENOL) tablet 650 mg  650 mg Oral Q6H PRN Nadiah Corbit   650 mg at 06/18/14 0520  . alum & mag hydroxide-simeth (MAALOX/MYLANTA) 200-200-20 MG/5ML suspension 30 mL  30 mL Oral Q4H PRN Shuvon Rankin, NP      . asenapine (SAPHRIS) sublingual tablet 5 mg  5 mg Sublingual 2 times per day Shuvon Rankin, NP   5 mg at 06/18/14 0734  . diphenhydrAMINE (BENADRYL) injection 50 mg  50 mg Intramuscular  Q8H PRN Quintel Mccalla   50 mg at 06/17/14 0206  . divalproex (DEPAKOTE ER) 24 hr tablet 1,000 mg  1,000 mg Oral QHS Betty Brooks      . divalproex (DEPAKOTE ER) 24 hr tablet 500 mg  500 mg Oral BID Mazie Fencl      . docusate sodium (COLACE) capsule 100 mg  100 mg Oral BID Neita Garnet, MD   100 mg at 06/18/14 0734  . FLUoxetine (PROZAC) tablet 40 mg  40 mg Oral Daily Rosalita Carey   40 mg at  06/18/14 0734  . labetalol (NORMODYNE) tablet 100 mg  100 mg Oral BID Shuvon Rankin, NP   100 mg at 06/18/14 0734  . latanoprost (XALATAN) 0.005 % ophthalmic solution 1 drop  1 drop Both Eyes QHS Shuvon Rankin, NP   1 drop at 06/17/14 2119  . levothyroxine (SYNTHROID, LEVOTHROID) tablet 50 mcg  50 mcg Oral QAC breakfast Shuvon Rankin, NP   50 mcg at 06/18/14 0519  . magnesium hydroxide (MILK OF MAGNESIA) suspension 30 mL  30 mL Oral Daily PRN Shuvon Rankin, NP      . multivitamin with minerals tablet 1 tablet  1 tablet Oral Daily Shuvon Rankin, NP   1 tablet at 06/18/14 0734  . nicotine (NICODERM CQ - dosed in mg/24 hours) patch 21 mg  21 mg Transdermal Daily Shuvon Rankin, NP      . OLANZapine zydis (ZYPREXA) disintegrating tablet 10 mg  10 mg Oral Q8H PRN Biance Moncrief   10 mg at 06/18/14 0226  . pantoprazole (PROTONIX) EC tablet 40 mg  40 mg Oral Daily Cruise Baumgardner   40 mg at 06/18/14 0734  . simvastatin (ZOCOR) tablet 20 mg  20 mg Oral q1800 Shuvon Rankin, NP   20 mg at 06/17/14 1700  . spironolactone (ALDACTONE) tablet 25 mg  25 mg Oral Daily Shuvon Rankin, NP   25 mg at 06/18/14 0734  . traZODone (DESYREL) tablet 100 mg  100 mg Oral QHS Chondra Boyde      . ziprasidone (GEODON) injection 20 mg  20 mg Intramuscular Q8H PRN Zyia Kaneko   20 mg at 06/17/14 0207    Lab Results: No results found for this or any previous visit (from the past 30 hour(s)).  Physical Findings: AIMS: Facial and Oral Movements Muscles of Facial Expression: None, normal Lips and Perioral Area: None, normal Jaw: None, normal Tongue: None, normal,Extremity Movements Upper (arms, wrists, hands, fingers): None, normal Lower (legs, knees, ankles, toes): None, normal, Trunk Movements Neck, shoulders, hips: None, normal, Overall Severity Severity of abnormal movements (highest score from questions above): None, normal Incapacitation due to abnormal movements: None, normal Patient's awareness of abnormal  movements (rate only patient's report): No Awareness, Dental Status Current problems with teeth and/or dentures?: No Does patient usually wear dentures?: No  CIWA:    COWS:     Assessment: Patient is slowly improving - mood is bette, but affect still somewhat expansive and labile at times. Thought process remains disorganized. Tolerating medications well, but with a subjective sense of sedation from Depakote.    Treatment Plan Summary: Daily contact with patient to assess and evaluate symptoms and progress in treatment Medication management See below  Plan: Continue inpatient treatment/support and milieu Continue medications as above-  Saphris 5 mgrs SL BID Prozac 40 mgrs QDAY  Depakote ER 500mg  po BID (am+1200pm), 1000mg  po qhs for mood lability. Valproic acid level on 06/20/14.   Medical Decision Making Problem Points:  Established  problem, improving (1) and Review of last therapy session (1) Data Points:  Review or order clinical lab tests (1) Review of medication regiment & side effects (2)  I certify that inpatient services furnished can reasonably be expected to improve the patient's condition.   Scotty Pinder,MD 06/18/2014, 10:46 AM

## 2014-06-19 MED ORDER — TRAZODONE HCL 100 MG PO TABS
200.0000 mg | ORAL_TABLET | Freq: Every day | ORAL | Status: DC
  Filled 2014-06-19: qty 2

## 2014-06-19 MED ORDER — CLONAZEPAM 1 MG PO TABS
1.0000 mg | ORAL_TABLET | Freq: Every day | ORAL | Status: DC
  Administered 2014-06-19 – 2014-06-27 (×9): 1 mg via ORAL
  Filled 2014-06-19 (×9): qty 1

## 2014-06-19 NOTE — Progress Notes (Signed)
D   Pt is intrusive and disorganized   He needs prompting to complete tasks and to maintain appropriate boundaries   He has been trying to take care of  his roommate and needs frequent reminding that's staffs job and he should focus on his own care   He can be labile and have mood swings but is improved over his first day at [facility] pt completed an interview successsfully with a Education officer, museum to complete a pasar so he can get into a assisted living facility A    Verbal support given   Medications administered and effectiveness monitored   Continue to redirect and encourage appropriate boundaries   Q 15 min checks R   Pt safe and redirects well

## 2014-06-19 NOTE — Progress Notes (Signed)
Adult Psychoeducational Group Note  Date:  06/19/2014 Time:  2:42 PM  Group Topic/Focus:  Personal Choices and Values:   The focus of this group is to help patients assess and explore the importance of values in their lives, how their values affect their decisions, how they express their values and what opposes their expression.  Participation Level:  Active  Participation Quality:  Appropriate  Affect:  Appropriate  Cognitive:  Disorganized  Insight: Limited  Engagement in Group:  Engaged  Modes of Intervention:  Education  Additional Comments:  Patient discussed personal values and tried to come up with new ways to incorporate positive values into their lives. Patient stated he valued his brother, going on vacation, health etc. Patient also stated he would like to have closer personal relationships with people. Patient stated he has many acquaintances but few close friends.  Oralia Manis 06/19/2014, 2:42 PM

## 2014-06-19 NOTE — Progress Notes (Signed)
Pt slept for about 2 hours then woke up and couldn't go back to sleep   He requested tylenol for back pain and said he would lay back down and try to go back to sleep

## 2014-06-19 NOTE — BHH Group Notes (Signed)
[facility] LCSW Aftercare Discharge Planning Group Note   06/19/2014 1:02 PM  Participation Quality:  Did not attend    [place]

## 2014-06-19 NOTE — Tx Team (Signed)
  Interdisciplinary Treatment Plan Update   Date Reviewed:  06/19/2014  Time Reviewed:  8:00 AM  Progress in Treatment:   Attending groups: Yes Participating in groups: Yes Taking medication as prescribed: Yes  Tolerating medication: Yes Family/Significant other contact made: Yes  Patient understands diagnosis: Yes  Discussing patient identified problems/goals with staff: Yes Medical problems stabilized or resolved: Yes Denies suicidal/homicidal ideation: Yes Patient has not harmed self or others: Yes  For review of initial/current patient goals, please see plan of care.  Estimated Length of Stay:  3-5 days  Reason for Continuation of Hospitalization: Mania Medication stabilization Other; describe Regulate sleep, mood  New Problems/Goals identified:  N/A  Discharge Plan or Barriers:   Group Home, outpt tx.  Additional Comments: : " My depakote needs to be done right. Also I have been having trouble sleeping at night.''   Patient seen and chart reviewed. Patient reports difficulty sleeping because he believes his depakote is not prescribe the way he used to take it before admission. Patient reports decreased agitation, mood lability but his behavior has been unpredictable. He could be defiant and demanding. Patient remains paranoid, he is fixated on people not caring much about him and worry why he has to be made to continue taking medications.  Depakote changed to 500 BID, 1000 QHS.  Trazodone added for sleep.  Pt had 3.5 hrs of observed sleep last night, up from 1.5 previous night.   Attendees:  Signature: Corena Pilgrim, MD 06/19/2014 8:00 AM   Signature: Ripley Fraise, LCSW 06/19/2014 8:00 AM  Signature: Elmarie Shiley, NP 06/19/2014 8:00 AM  Signature: Mayra Neer, RN 06/19/2014 8:00 AM  Signature: Darrol Angel, RN 06/19/2014 8:00 AM  Signature:  06/19/2014 8:00 AM  Signature:   06/19/2014 8:00 AM  Signature:    Signature:    Signature:    Signature:    Signature:     Signature:      Scribe for Treatment Team:   Ripley Fraise, LCSW  06/19/2014 8:00 AM

## 2014-06-19 NOTE — Progress Notes (Signed)
Pt presents pleasant today on approach. Pt cooperative with treatment plan, attending groups and compliant with taking meds. Pt drowsy during daytime d/t morning meds and often take naps. Pt reported that he slept well last night although it was reported by staff that pt slept 3.5 hours. Pt denies SI/HI/AVH. Pt thoughts are disorganized and speech is tangential. Pt has fine hand tremors and has been dropping his drinks occasionally.   Medications administered as ordered per MD. Verbal support given. Pt encouraged to attend groups. 15 minute checks performed for safety.  Pt safety maintained at this time.

## 2014-06-19 NOTE — Progress Notes (Signed)
Adult Psychoeducational Group Note  Date:  06/19/2014 Time:  9:19 PM  Group Topic/Focus:  Wrap-Up Group:   The focus of this group is to help patients review their daily goal of treatment and discuss progress on daily workbooks.  Participation Level:  Active  Participation Quality:  Monopolizing  Affect:  Excited  Cognitive:  Appropriate  Insight: Limited  Engagement in Group:  Engaged  Modes of Intervention:  Socialization and Support  Additional Comments:  Patient attended and participated in group tonight. He reported that he spoke with his Education officer, museum today, signs some forms to finalized everything to get into a [place]. For his personal development he would like to learn how to relate to people better.  Salley Scarlet [facility] 06/19/2014, 9:19 PM

## 2014-06-19 NOTE — Progress Notes (Signed)
Patient ID: [name], male   DOB: [birth year], 58 y.o.   MRN: [id number] [facility] MD Progress Note  06/19/2014 11:36 AM Oscar Hank  MRN:  [id number]  Subjective: Patient report: " People worry that I am not sleeping I only need 3-4 hours of sleep per night and I am good.''   Objective: Patient seen and chart reviewed. Patient continues to have difficulty sleeping due to racing thoughts and worries. However, he is endorsing decreased paranoia, mood lability, irritability and agitation.Patient's behavior is unpredictable, he could be defiant,  Demanding and oppositional.  Patient has been compliant with his medications and the unit milieu. He denies any adverse reactions to his medications.   Diagnosis:  Schizoaffective disorder, bipolar type  Total Time spent with patient: 25 minutes    ADL's:  Fair   Sleep: Improving  Appetite: Fair   Suicidal Ideation:  Denies  Homicidal Ideation:  Denies  AEB (as evidenced by):  Psychiatric Specialty Exam: Physical Exam  Psychiatric: His mood appears anxious. His affect is angry and labile. His speech is rapid and/or pressured. He is agitated and aggressive. Thought content is paranoid. He expresses impulsivity.    Review of Systems  Constitutional: Negative.  Negative for fever and chills.  HENT: Negative.   Eyes: Negative.   Respiratory: Negative.  Negative for cough and shortness of breath.   Cardiovascular: Negative.  Negative for chest pain.  Gastrointestinal: Negative.   Genitourinary: Negative.   Musculoskeletal: Negative.   Skin: Negative.   Neurological: Positive for tremors.  Endo/Heme/Allergies: Negative.   Psychiatric/Behavioral: Positive for depression.    Blood pressure 112/65, pulse 75, temperature 98.3 F (36.8 C), temperature source Oral, resp. rate 18, height 6\' 2"  (1.88 m), weight 108.863 kg (240 lb).Body mass index is 30.8 kg/(m^2).  General Appearance: Fairly Groomed  Engineer, water::  Good  Speech:  Normal Rate  Volume:   Normal  Mood:  irritable  Affect:   somewhat labile.   Thought Process: As above, remains disorganized in thought process but improving gradually  Orientation:  NA  Thought Content:  Currently is denying any hallucinations, and at this time does not appear internally preoccupied   Suicidal Thoughts:  No-   Homicidal Thoughts:  No  Memory:  NA  Judgement:  Fair  Insight:  Lacking  Psychomotor Activity:  Normal  Concentration:  Good  Recall:  Good  Fund of Knowledge:Good  Language: Good  Akathisia:  Negative  Handed:  Right  AIMS (if indicated):     Assets:  Communication Skills Desire for Improvement Resilience  Sleep:  Number of Hours: 3.5   Musculoskeletal: Strength & Muscle Tone: within normal limits Gait & Station: normal Patient leans: N/A  Current Medications: Current Facility-Administered Medications  Medication Dose Route Frequency Provider Last Rate Last Dose  . acetaminophen (TYLENOL) tablet 650 mg  650 mg Oral Q6H PRN Princess Karnes   650 mg at 06/19/14 0258  . alum & mag hydroxide-simeth (MAALOX/MYLANTA) 200-200-20 MG/5ML suspension 30 mL  30 mL Oral Q4H PRN Shuvon Rankin, NP      . ampicillin (PRINCIPEN) capsule 500 mg  500 mg Oral QPC breakfast Florian Chauca   500 mg at 06/19/14 0945  . asenapine (SAPHRIS) sublingual tablet 5 mg  5 mg Sublingual 2 times per day Shuvon Rankin, NP   5 mg at 06/19/14 0738  . clonazePAM (KLONOPIN) tablet 1 mg  1 mg Oral QHS Sachin Ferencz      . diphenhydrAMINE (BENADRYL) injection 50 mg  50  mg Intramuscular Q8H PRN Florence Antonelli   50 mg at 06/17/14 0206  . divalproex (DEPAKOTE ER) 24 hr tablet 1,000 mg  1,000 mg Oral QHS Azazel Franze   1,000 mg at 06/18/14 2241  . divalproex (DEPAKOTE ER) 24 hr tablet 500 mg  500 mg Oral BID Nivea Wojdyla   500 mg at 06/19/14 0946  . docusate sodium (COLACE) capsule 100 mg  100 mg Oral BID Neita Garnet, MD   100 mg at 06/19/14 0737  . FLUoxetine (PROZAC) tablet 40 mg  40 mg Oral Daily  Suresh Audi   40 mg at 06/19/14 0738  . labetalol (NORMODYNE) tablet 100 mg  100 mg Oral BID Shuvon Rankin, NP   100 mg at 06/19/14 0738  . latanoprost (XALATAN) 0.005 % ophthalmic solution 1 drop  1 drop Both Eyes QHS Shuvon Rankin, NP   1 drop at 06/18/14 2241  . levothyroxine (SYNTHROID, LEVOTHROID) tablet 50 mcg  50 mcg Oral QAC breakfast Shuvon Rankin, NP   50 mcg at 06/19/14 0612  . magnesium hydroxide (MILK OF MAGNESIA) suspension 30 mL  30 mL Oral Daily PRN Shuvon Rankin, NP      . multivitamin with minerals tablet 1 tablet  1 tablet Oral Daily Shuvon Rankin, NP   1 tablet at 06/19/14 0737  . nicotine (NICODERM CQ - dosed in mg/24 hours) patch 21 mg  21 mg Transdermal Daily Shuvon Rankin, NP      . OLANZapine zydis (ZYPREXA) disintegrating tablet 10 mg  10 mg Oral Q8H PRN Grettell Ransdell   10 mg at 06/18/14 2241  . pantoprazole (PROTONIX) EC tablet 40 mg  40 mg Oral Daily Holten Spano   40 mg at 06/19/14 0737  . simvastatin (ZOCOR) tablet 20 mg  20 mg Oral q1800 Shuvon Rankin, NP   20 mg at 06/18/14 1642  . spironolactone (ALDACTONE) tablet 25 mg  25 mg Oral Daily Shuvon Rankin, NP   25 mg at 06/19/14 0737  . ziprasidone (GEODON) injection 20 mg  20 mg Intramuscular Q8H PRN Danielys Madry   20 mg at 06/17/14 0207    Lab Results: No results found for this or any previous visit (from the past 23 hour(s)).  Physical Findings: AIMS: Facial and Oral Movements Muscles of Facial Expression: None, normal Lips and Perioral Area: None, normal Jaw: None, normal Tongue: None, normal,Extremity Movements Upper (arms, wrists, hands, fingers): None, normal Lower (legs, knees, ankles, toes): None, normal, Trunk Movements Neck, shoulders, hips: None, normal, Overall Severity Severity of abnormal movements (highest score from questions above): None, normal Incapacitation due to abnormal movements: None, normal Patient's awareness of abnormal movements (rate only patient's report): No  Awareness, Dental Status Current problems with teeth and/or dentures?: No Does patient usually wear dentures?: No  CIWA:    COWS:     Assessment: Patient is slowly improving - mood is bette, but affect still somewhat expansive and labile at times. Thought process remains disorganized. Tolerating medications well, but with a subjective sense of sedation from Depakote.    Treatment Plan Summary: Daily contact with patient to assess and evaluate symptoms and progress in treatment Medication management See below  Plan: Continue inpatient treatment/support and milieu Continue medications as above-  Saphris 5 mgrs SL BID Prozac 40 mgrs QDAY  Depakote ER 500mg  po BID (am+1200pm), 1000mg  po qhs for mood lability. Discontinue Trazodone. Initiate Clonazepam 1mg  po Qhs for insomnia/agitation. Valproic acid level on 06/20/14.   Medical Decision Making Problem Points:  Established problem,  improving (1) and Review of last therapy session (1) Data Points:  Review or order clinical lab tests (1) Review of medication regiment & side effects (2)  I certify that inpatient services furnished can reasonably be expected to improve the patient's condition.   Daquarius Dubeau,MD 06/19/2014, 11:36 AM

## 2014-06-20 MED ORDER — NAPROXEN 375 MG PO TABS: 375.0000 mg | ORAL_TABLET | Freq: Two times a day (BID) | ORAL | Status: DC | PRN

## 2014-06-20 NOTE — Progress Notes (Signed)
Patient ID: Brandon Maynard, male   DOB: 1956-10-31, 58 y.o.   MRN: [id number] D: Patient stated he had a good day. Pt reports his pain is under control. Pt reports he was able to talk to his brother on the phone for about 20 minutes and that made his day better. Pt observed in dayroom interacting with peers. Pt denies SI/HI/AVH. Pt attended evening wrap up group and engaged in discussion. Cooperative with assessment. No acute distressed noted at this time.   A: Met with pt 1:1. Medications administered as prescribed. Writer encouraged pt to discuss feelings. Pt encouraged to come to staff with any questions or concerns.   R: Patient is safe on the unit. He is complaint with medications and denies any adverse reaction. Continue current POC.

## 2014-06-20 NOTE — Progress Notes (Signed)
Patient ID: Brandon Maynard, male   DOB: [birth year], 58 y.o.   MRN: [id number] D: Patient stated his day was good is doing well. Pt reports to writer he did not require much sleep but 3-4 hours is quite enough. Pt observed in dayroom interacting with peers. Pt denies SI/HI/AVH. Pt attended evening wrap up group and engaged in discussion. Cooperative with assessment. No acute distressed noted at this time.   A: Met with pt 1:1. Medications administered as prescribed. Writer encouraged pt to discuss feelings. Pt encouraged to come to staff with any questions or concerns.   R: Patient is safe on the unit. He is complaint with medications and denies any adverse reaction. Continue current POC.

## 2014-06-20 NOTE — [facility] Group Notes (Signed)
[place] Group Notes:  (Nursing/MHT/Case Management/Adjunct)  Date:  06/20/2014  Time: 0900 am Type of Therapy:  Psychoeducational Skills  Participation Level:  Did Not Attend   Brandon Maynard 06/20/2014, 1:48 PM

## 2014-06-20 NOTE — Progress Notes (Signed)
Patient ID: Brandon Maynard, male   DOB: 05/11/1956, 58 y.o.   MRN: [id number] [facility] MD Progress Note  06/20/2014 10:57 AM Brandon Maynard  MRN:  [id number]  Subjective: Patient report: " I got to talk to brother yesterday about going to group home, he said it is a good idea.''   Objective: Patient seen and his chart was reviewed. Patient reports that he slept better last night but continues to endorse  racing thoughts and worries. However, he is verbalizing decreased mood lability, irritability and delusional thinking. He has been compliant with his medications and the unit milieu. Our clinical social worker has been working towards his placement in a group home when he is discharged from here. He has been interviewed and currently wait for a PASSER number.   Diagnosis:  Schizoaffective disorder, bipolar type  Total Time spent with patient: 25 minutes   ADL's:  Fair   Sleep: Improving  Appetite: Fair   Suicidal Ideation:  Denies  Homicidal Ideation:  Denies  AEB (as evidenced by):  Psychiatric Specialty Exam: Physical Exam  Psychiatric: His mood appears anxious. His affect is angry and labile. His speech is rapid and/or pressured. He is agitated and aggressive. Thought content is paranoid. He expresses impulsivity.    Review of Systems  Constitutional: Negative.  Negative for fever and chills.  HENT: Negative.   Eyes: Negative.   Respiratory: Negative.  Negative for cough and shortness of breath.   Cardiovascular: Negative.  Negative for chest pain.  Gastrointestinal: Negative.   Genitourinary: Negative.   Musculoskeletal: Negative.   Skin: Negative.   Neurological: Positive for tremors.  Endo/Heme/Allergies: Negative.   Psychiatric/Behavioral: Positive for depression.    Blood pressure 131/73, pulse 70, temperature 98.7 F (37.1 C), temperature source Oral, resp. rate 18, height 6\' 2"  (1.88 m), weight 108.863 kg (240 lb).Body mass index is 30.8 kg/(m^2).  General Appearance: Fairly  Groomed  Engineer, water::  Good  Speech:  Normal Rate  Volume:  Normal  Mood:  irritable  Affect:   somewhat labile.   Thought Process: As above, remains disorganized in thought process but improving gradually  Orientation:  NA  Thought Content:  Currently is denying any hallucinations, and at this time does not appear internally preoccupied   Suicidal Thoughts:  No-   Homicidal Thoughts:  No  Memory:  NA  Judgement:  Fair  Insight:  Lacking  Psychomotor Activity:  Normal  Concentration:  Good  Recall:  Good  Fund of Knowledge:Good  Language: Good  Akathisia:  Negative  Handed:  Right  AIMS (if indicated):     Assets:  Communication Skills Desire for Improvement Resilience  Sleep:  Number of Hours: 4.5   Musculoskeletal: Strength & Muscle Tone: within normal limits Gait & Station: normal Patient leans: N/A  Current Medications: Current Facility-Administered Medications  Medication Dose Route Frequency Provider Last Rate Last Dose  . acetaminophen (TYLENOL) tablet 650 mg  650 mg Oral Q6H PRN Heaven Wandell   650 mg at 06/20/14 0046  . alum & mag hydroxide-simeth (MAALOX/MYLANTA) 200-200-20 MG/5ML suspension 30 mL  30 mL Oral Q4H PRN Shuvon Rankin, NP      . ampicillin (PRINCIPEN) capsule 500 mg  500 mg Oral QPC breakfast Tranae Laramie   500 mg at 06/20/14 0814  . asenapine (SAPHRIS) sublingual tablet 5 mg  5 mg Sublingual 2 times per day Shuvon Rankin, NP   5 mg at 06/20/14 0814  . clonazePAM (KLONOPIN) tablet 1 mg  1 mg Oral  QHS Blondell Laperle   1 mg at 06/19/14 2117  . diphenhydrAMINE (BENADRYL) injection 50 mg  50 mg Intramuscular Q8H PRN Stephanos Fan   50 mg at 06/17/14 0206  . divalproex (DEPAKOTE ER) 24 hr tablet 1,000 mg  1,000 mg Oral QHS Shamecka Hocutt   1,000 mg at 06/19/14 2117  . divalproex (DEPAKOTE ER) 24 hr tablet 500 mg  500 mg Oral BID Tessi Eustache   500 mg at 06/20/14 0815  . docusate sodium (COLACE) capsule 100 mg  100 mg Oral BID Neita Garnet,  MD   100 mg at 06/20/14 9833  . FLUoxetine (PROZAC) tablet 40 mg  40 mg Oral Daily Khalie Wince   40 mg at 06/20/14 0814  . labetalol (NORMODYNE) tablet 100 mg  100 mg Oral BID Shuvon Rankin, NP   100 mg at 06/20/14 0814  . latanoprost (XALATAN) 0.005 % ophthalmic solution 1 drop  1 drop Both Eyes QHS Shuvon Rankin, NP   1 drop at 06/19/14 2117  . levothyroxine (SYNTHROID, LEVOTHROID) tablet 50 mcg  50 mcg Oral QAC breakfast Shuvon Rankin, NP   50 mcg at 06/20/14 0630  . magnesium hydroxide (MILK OF MAGNESIA) suspension 30 mL  30 mL Oral Daily PRN Shuvon Rankin, NP      . multivitamin with minerals tablet 1 tablet  1 tablet Oral Daily Shuvon Rankin, NP   1 tablet at 06/20/14 0814  . nicotine (NICODERM CQ - dosed in mg/24 hours) patch 21 mg  21 mg Transdermal Daily Shuvon Rankin, NP      . OLANZapine zydis (ZYPREXA) disintegrating tablet 10 mg  10 mg Oral Q8H PRN Terianna Peggs   10 mg at 06/20/14 0046  . pantoprazole (PROTONIX) EC tablet 40 mg  40 mg Oral Daily Lurae Hornbrook   40 mg at 06/20/14 0814  . simvastatin (ZOCOR) tablet 20 mg  20 mg Oral q1800 Shuvon Rankin, NP   20 mg at 06/19/14 1703  . spironolactone (ALDACTONE) tablet 25 mg  25 mg Oral Daily Shuvon Rankin, NP   25 mg at 06/20/14 0814  . ziprasidone (GEODON) injection 20 mg  20 mg Intramuscular Q8H PRN Jarquis Walker   20 mg at 06/17/14 0207    Lab Results: No results found for this or any previous visit (from the past 35 hour(s)).  Physical Findings: AIMS: Facial and Oral Movements Muscles of Facial Expression: None, normal Lips and Perioral Area: None, normal Jaw: None, normal Tongue: None, normal,Extremity Movements Upper (arms, wrists, hands, fingers): None, normal Lower (legs, knees, ankles, toes): None, normal, Trunk Movements Neck, shoulders, hips: None, normal, Overall Severity Severity of abnormal movements (highest score from questions above): None, normal Incapacitation due to abnormal movements: None,  normal Patient's awareness of abnormal movements (rate only patient's report): No Awareness, Dental Status Current problems with teeth and/or dentures?: No Does patient usually wear dentures?: No  CIWA:    COWS:     Assessment: Patient is slowly improving - mood is bette, but affect still somewhat expansive and labile at times. Thought process remains disorganized. Tolerating medications well, but with a subjective sense of sedation from Depakote.    Treatment Plan Summary: Daily contact with patient to assess and evaluate symptoms and progress in treatment Medication management See below  Plan: Continue inpatient treatment/support and milieu Continue medications as below:  -Saphris 5 mgrs SL BID -Prozac 40 mgrs QDAY  -Depakote ER 500mg  po BID (am+1200pm), 1000mg  po qhs for mood lability. -Clonazepam 1mg  po Qhs for  insomnia/agitation. Valproic acid level on 06/21/14.   Medical Decision Making Problem Points:  Established problem, improving (1) and Review of last therapy session (1) Data Points:  Review or order clinical lab tests (1) Review of medication regiment & side effects (2)  I certify that inpatient services furnished can reasonably be expected to improve the patient's condition.   Desma Wilkowski,MD 06/20/2014, 10:57 AM

## 2014-06-20 NOTE — Progress Notes (Signed)
Patient ID: Brandon Maynard, male   DOB: [birth year], 58 y.o.   MRN: [id number] D: Patient is labile and irritable.  He was compliant with his 0800 meds, however, refused his depakote at 1200.  Patient states, "I feel like my legs are going to give out on me.  I will take that until I talk to the doctor."  Patient has spoken with doctor earlier.  He can get easily agitated.  He denies any SI/HI/AVH.  Patient will be interviewed by a group home today.  A: Continue to monitor medication management and MD orders.  Safety checks completed every 15 minutes per protocol.  R: Patient has minimal contact with staff.

## 2014-06-20 NOTE — [facility] Group Notes (Signed)
[facility] LCSW Group Therapy  06/20/2014 , 3:15 PM   Type of Therapy:  Group Therapy  Participation Level:  Active  Participation Quality:  Attentive  Affect:  Appropriate  Cognitive:  Alert  Insight:  Improving  Engagement in Therapy:  Engaged  Modes of Intervention:  Discussion, Exploration and Socialization  Summary of Progress/Problems: Today's group focused on the term Diagnosis.  Participants were asked to define the term, and then pronounce whether it is a negative, positive or neutral term.  Minimal participation, but not disruptive.  Stated that "my mother taught me to be a simple man.  I'm doing the best I can."    [name] 06/20/2014 , 3:15 PM

## 2014-06-21 LAB — VALPROIC ACID LEVEL: VALPROIC ACID LVL: 58.7 ug/mL (ref 50.0–100.0)

## 2014-06-21 NOTE — Progress Notes (Signed)
Patient ID: Brandon Maynard, male   DOB: [birth year], 58 y.o.   MRN: [id number]    [facility] MD Progress Note  06/21/2014 10:55 AM Brandon Maynard  MRN:  [id number]  Subjective: Patient report: " I am feeling more stable. I am happy with the way my Depakote is prescribed. I was able to talk to my brother. I am coming to accept going to a group home."  Objective: Patient seen and his chart was reviewed. Patient reports that he slept better last night but continues to endorse  racing thoughts and worries. However, he is verbalizing decreased mood lability, irritability and delusional thinking. He has been compliant with his medications and the unit milieu. The patient will be going to a group home upon discharge. Brandon Maynard has been complaining of knee pain, which he reports is better with Tylenol. Also patient has reported the sensation "My knees are going to buckle." However, his gait was observed to be steady and there have been no reports of any falls. The patient is accepting the group home placement but continues to worry about how this will change the structure of his life. His mood appears to be slowly stabilizing. Patient is now reporting less sedation with current Depakote regimen.    Diagnosis:  Schizoaffective disorder, bipolar type  Total Time spent with patient: 20 minutes   ADL's:  Fair   Sleep: Fair   Appetite: Good  Suicidal Ideation:  Denies  Homicidal Ideation:  Denies  AEB (as evidenced by):  Psychiatric Specialty Exam: Physical Exam  Psychiatric: His behavior is normal. His mood appears anxious. His affect is labile. His speech is rapid and/or pressured. He expresses impulsivity.    Review of Systems  Constitutional: Negative.  Negative for fever and chills.  HENT: Negative.   Eyes: Negative.   Respiratory: Negative.  Negative for cough and shortness of breath.   Cardiovascular: Negative.  Negative for chest pain.  Gastrointestinal: Negative.   Genitourinary: Negative.   Musculoskeletal:  Negative.        Patient reports chronic knee pain.   Skin: Negative.   Neurological: Negative.   Endo/Heme/Allergies: Negative.   Psychiatric/Behavioral: Negative for depression. The patient is nervous/anxious.     Blood pressure 131/79, pulse 85, temperature 98 F (36.7 C), temperature source Oral, resp. rate 18, height 6\' 2"  (1.88 m), weight 108.863 kg (240 lb).Body mass index is 30.8 kg/(m^2).  General Appearance: Fairly Groomed  Engineer, water::  Good  Speech:  Normal Rate  Volume:  Normal  Mood:  Labile   Affect:  Anxious  Thought Process: Goal Directed   Orientation:  NA  Thought Content:  Rumination  Suicidal Thoughts:  No  Homicidal Thoughts:  No  Memory:  NA  Judgement:  Fair  Insight:  Lacking  Psychomotor Activity:  Normal  Concentration:  Good  Recall:  Good  Fund of Knowledge:Good  Language: Good  Akathisia:  Negative  Handed:  Right  AIMS (if indicated):     Assets:  Communication Skills Desire for Improvement Resilience  Sleep:  Number of Hours: 5.5   Musculoskeletal: Strength & Muscle Tone: within normal limits Gait & Station: normal Patient leans: N/A  Current Medications: Current Facility-Administered Medications  Medication Dose Route Frequency Provider Last Rate Last Dose  . acetaminophen (TYLENOL) tablet 650 mg  650 mg Oral Q6H PRN Fianna Snowball   650 mg at 06/21/14 6834  . alum & mag hydroxide-simeth (MAALOX/MYLANTA) 200-200-20 MG/5ML suspension 30 mL  30 mL Oral Q4H PRN Shuvon Rankin,  NP      . ampicillin (PRINCIPEN) capsule 500 mg  500 mg Oral QPC breakfast Katiana Ruland   500 mg at 06/21/14 0800  . asenapine (SAPHRIS) sublingual tablet 5 mg  5 mg Sublingual 2 times per day Shuvon Rankin, NP   5 mg at 06/21/14 0800  . clonazePAM (KLONOPIN) tablet 1 mg  1 mg Oral QHS Michiel Sivley   1 mg at 06/20/14 2210  . diphenhydrAMINE (BENADRYL) injection 50 mg  50 mg Intramuscular Q8H PRN Ryver Zadrozny   50 mg at 06/17/14 0206  . divalproex  (DEPAKOTE ER) 24 hr tablet 1,000 mg  1,000 mg Oral QHS Natalin Bible   1,000 mg at 06/20/14 2210  . divalproex (DEPAKOTE ER) 24 hr tablet 500 mg  500 mg Oral BID Densel Kronick   500 mg at 06/21/14 0759  . docusate sodium (COLACE) capsule 100 mg  100 mg Oral BID Neita Garnet, MD   100 mg at 06/21/14 0800  . FLUoxetine (PROZAC) tablet 40 mg  40 mg Oral Daily Joshue Badal   40 mg at 06/21/14 0759  . labetalol (NORMODYNE) tablet 100 mg  100 mg Oral BID Shuvon Rankin, NP   100 mg at 06/21/14 0800  . latanoprost (XALATAN) 0.005 % ophthalmic solution 1 drop  1 drop Both Eyes QHS Shuvon Rankin, NP   1 drop at 06/20/14 2211  . levothyroxine (SYNTHROID, LEVOTHROID) tablet 50 mcg  50 mcg Oral QAC breakfast Shuvon Rankin, NP   50 mcg at 06/21/14 3875  . magnesium hydroxide (MILK OF MAGNESIA) suspension 30 mL  30 mL Oral Daily PRN Shuvon Rankin, NP      . multivitamin with minerals tablet 1 tablet  1 tablet Oral Daily Shuvon Rankin, NP   1 tablet at 06/21/14 0800  . naproxen (NAPROSYN) tablet 375 mg  375 mg Oral BID PRN Elmarie Shiley, NP      . nicotine (NICODERM CQ - dosed in mg/24 hours) patch 21 mg  21 mg Transdermal Daily Shuvon Rankin, NP      . OLANZapine zydis (ZYPREXA) disintegrating tablet 10 mg  10 mg Oral Q8H PRN Godric Lavell   10 mg at 06/20/14 0046  . pantoprazole (PROTONIX) EC tablet 40 mg  40 mg Oral Daily Julious Langlois   40 mg at 06/21/14 0800  . simvastatin (ZOCOR) tablet 20 mg  20 mg Oral q1800 Shuvon Rankin, NP   20 mg at 06/20/14 1808  . spironolactone (ALDACTONE) tablet 25 mg  25 mg Oral Daily Shuvon Rankin, NP   25 mg at 06/21/14 0800  . ziprasidone (GEODON) injection 20 mg  20 mg Intramuscular Q8H PRN Gaytha Raybourn   20 mg at 06/17/14 0207    Lab Results:  Results for orders placed during the hospital encounter of 06/13/14 (from the past 48 hour(s))  VALPROIC ACID LEVEL     Status: None   Collection Time    06/21/14  6:28 AM      Result Value Ref Range   Valproic Acid  Lvl 58.7  50.0 - 100.0 ug/mL   Comment: Performed at [facility]    Physical Findings: AIMS: Facial and Oral Movements Muscles of Facial Expression: None, normal Lips and Perioral Area: None, normal Jaw: None, normal Tongue: None, normal,Extremity Movements Upper (arms, wrists, hands, fingers): None, normal Lower (legs, knees, ankles, toes): None, normal, Trunk Movements Neck, shoulders, hips: None, normal, Overall Severity Severity of abnormal movements (highest score from questions above): None, normal Incapacitation due  to abnormal movements: None, normal Patient's awareness of abnormal movements (rate only patient's report): No Awareness, Dental Status Current problems with teeth and/or dentures?: No Does patient usually wear dentures?: No  CIWA:    COWS:     Assessment:  Treatment Plan Summary: Daily contact with patient to assess and evaluate symptoms and progress in treatment Medication management See below  Plan: 1. Continue inpatient treatment/support and milieu 2. Continue medications as below:  -Saphris 5 mgrs SL BID for psychosis  -Prozac 40 mg daily for depression  -Depakote ER 500mg  po BID (am+1200pm), 1000mg  po qhs for mood lability. -Clonazepam 1mg  po Qhs for insomnia/agitation. 3. Valproic acid level on 06/21/14 58.7.  4. Address health issues: Vitals reviewed and stable.   Medical Decision Making Problem Points:  Established problem, improving (1) and Review of last therapy session (1) Data Points:  Review or order clinical lab tests (1) Review of medication regiment & side effects (2)  I certify that inpatient services furnished can reasonably be expected to improve the patient's condition.   Elmarie Shiley, NP-C 06/21/2014, 10:55 AM  Patient seen, evaluated and I agree with notes by Nurse Practitioner. Corena Pilgrim, MD

## 2014-06-21 NOTE — Progress Notes (Signed)
  D: Patient yelled out in hallway. Was upset with staff because he reports that he has asked so many people to check if any of his belongings that his brother said he would bring are here. He also expressed some paranoia thinking that a staff member that accidentally got him for a telephone call that was not for him. A: Patient calmed fast when speaking with undersigned. R: Will continue to redirect as needed.

## 2014-06-21 NOTE — [facility] Group Notes (Signed)
[place] LCSW Group Therapy  06/21/2014  1:05 PM  Type of Therapy:  Group therapy  Participation Level:  Active  Participation Quality:  Attentive  Affect:  Flat  Cognitive:  Oriented  Insight:  Limited  Engagement in Therapy:  Limited  Modes of Intervention:  Discussion, Socialization  Summary of Progress/Problems:  Chaplain was here to lead a group on themes of hope and courage.  Ajdin entered group, and was able to sit quietly for awhile.  All of a sudden burst out that he is waiting for clothes from his brother, and no one is checking for him, and he wants it done now.  Got up and left, spilling coffee on the way.  [name] 06/21/2014 2:49 PM

## 2014-06-21 NOTE — Progress Notes (Signed)
Patient ID: Brandon Maynard, male   DOB: 08/28/1956, 58 y.o.   MRN: [id number]   D: Patient has a flat affect on approach this am. Some irritability noted this morning and can be labile at times. He says mood is improving but wanting to talk to doctor about medication.  Met with group operator yesterday but she reported to social worker that he was disorganized at the end of their conversation. His sleep is slowly improving. Did appear to be talking to self in hallway earlier today. A: Staff will monitor on q 15 minute checks, follow treatment plan, and give meds as ordered.  R: Cooperative on the unit at this time 

## 2014-06-21 NOTE — Progress Notes (Signed)
Adult Psychoeducational Group Note  Date:  06/21/2014 Time:  9:13 PM  Group Topic/Focus:  Wrap-Up Group:   The focus of this group is to help patients review their daily goal of treatment and discuss progress on daily workbooks.  Participation Level:  Did Not Attend  Participation Quality:  Drowsy  Affect:  Flat  Cognitive:  Lacking  Insight: None  Engagement in Group:  None  Modes of Intervention:  None  Additional Comments:  Pt was not at group pt decide not to come to group and decided to stay in bed resting   Jemari Hallum R 06/21/2014, 9:13 PM

## 2014-06-22 DIAGNOSIS — F259 Schizoaffective disorder, unspecified: Principal | ICD-10-CM

## 2014-06-22 NOTE — Progress Notes (Signed)
D: pt stated he slept okay last night, besides having a dream of a booger attacking him. Denies si/hi/avh and pain. Pt is appropriate on unit. Fair eye contact. anxious affect and expression.  A: scheduled medications given. Support and encouragement offered. q 15 min safety checks R: pt remains safe on unit. No signs of distress noted.

## 2014-06-22 NOTE — [facility] Group Notes (Signed)
[place] Group Notes:  (Clinical Social Work)  06/22/2014   11:15am-12:00pm  Summary of Progress/Problems:  The main focus of today's process group was to listen to a variety of genres of music and to identify that different types of music provoke different responses.  The patient then was able to identify personally what was soothing for them, as well as energizing.    The patient expressed understanding of how music can be used at home as a wellness/recovery tool.  He chose music by an old group, [employer], and was excited to have a connection to his past.  Type of Therapy:  Music Therapy   Participation Level:  Active  Participation Quality:  Attentive and Sharing  Affect:  Blunted  Cognitive:  Oriented  Insight:  Engaged  Engagement in Therapy:  Engaged  Modes of Intervention:   Activity, Exploration  Selmer Dominion, LCSW 06/22/2014, 11:47 AM

## 2014-06-22 NOTE — Progress Notes (Signed)
Patient ID: Brandon Maynard, male   DOB: [birth year], 58 y.o.   MRN: [id number] D: Patient stated his day was good but had an outburst about his medications. Pt reports he has been staying away from the dayroom because of what is shown on TV. Pt denies SI/HI/AVH.  No acute distressed noted at this time.   A: Met with pt 1:1. Medications administered as prescribed. Writer encouraged pt to discuss feelings. Pt encouraged to come to staff with any questions or concerns.   R: Patient is safe on the unit. He is complaint with medications and denies any adverse reaction. Continue current POC.

## 2014-06-22 NOTE — Progress Notes (Signed)
Patient ID: Brandon Maynard, male   DOB: 1956/02/06, 58 y.o.   MRN: [id number]    [facility] MD Progress Note  06/22/2014 10:37 AM Brandon Maynard  MRN:  [id number]  Subjective: Patient report: " I am feeling more stable. Not agitated . Talked about his incident with police that brought him here."  Objective: Patient seen and his chart was reviewed. Patient endorses less racing toughts and fixation to his reason being here. Had some knee pain, which he reports is better with Tylenol. Gait was slow but ambulating reasonable and there have been no reports of any falls. The patient is accepting the group home placement but continues to worry about how this will change the structure of his life. His mood appears to be slowly stabilizing. Patient is not sedated and attending groups.  Diagnosis:  Schizoaffective disorder, bipolar type  Total Time spent with patient: 20 minutes   ADL's:  Fair   Sleep: Fair   Appetite: Good  Suicidal Ideation:  Denies  Homicidal Ideation:  Denies  AEB (as evidenced by):  Psychiatric Specialty Exam: Physical Exam  Psychiatric: His mood appears not anxious. His affect is labile. His speech is not rapid and/or pressured. He is slowed. He does not express impulsivity or inappropriate judgment.    Review of Systems  Constitutional: Negative.  Negative for fever and chills.  HENT: Negative.   Eyes: Negative.   Respiratory: Negative.  Negative for cough and shortness of breath.   Cardiovascular: Negative.  Negative for chest pain.  Gastrointestinal: Negative.   Genitourinary: Negative.   Musculoskeletal: Negative.        Patient reports chronic knee pain.   Skin: Negative.   Neurological: Negative.   Endo/Heme/Allergies: Negative.   Psychiatric/Behavioral: Negative for depression. The patient is nervous/anxious.     Blood pressure 128/82, pulse 69, temperature 98.5 F (36.9 C), temperature source Oral, resp. rate 16, height 6\' 2"  (1.88 m), weight 240 lb (108.863 kg).Body mass  index is 30.8 kg/(m^2).  General Appearance: Fairly Groomed  Engineer, water::  Good  Speech:  Normal Rate  Volume:  Normal  Mood:  Labile   Affect:  Anxious  Thought Process: Goal Directed   Orientation:  NA  Thought Content:  Rumination  Suicidal Thoughts:  No  Homicidal Thoughts:  No  Memory:  NA  Judgement:  Fair  Insight:  Lacking  Psychomotor Activity:  Normal  Concentration:  Good  Recall:  Good  Fund of Knowledge:Good  Language: Good  Akathisia:  Negative  Handed:  Right  AIMS (if indicated):     Assets:  Communication Skills Desire for Improvement Resilience  Sleep:  Number of Hours: 5.5   Musculoskeletal: Strength & Muscle Tone: within normal limits Gait & Station: normal Patient leans: N/A  Current Medications: Current Facility-Administered Medications  Medication Dose Route Frequency Provider Last Rate Last Dose  . acetaminophen (TYLENOL) tablet 650 mg  650 mg Oral Q6H PRN Mojeed Akintayo   650 mg at 06/21/14 2321  . alum & mag hydroxide-simeth (MAALOX/MYLANTA) 200-200-20 MG/5ML suspension 30 mL  30 mL Oral Q4H PRN Shuvon Rankin, NP      . ampicillin (PRINCIPEN) capsule 500 mg  500 mg Oral QPC breakfast Mojeed Akintayo   500 mg at 06/22/14 0754  . asenapine (SAPHRIS) sublingual tablet 5 mg  5 mg Sublingual 2 times per day Shuvon Rankin, NP   5 mg at 06/22/14 0755  . clonazePAM (KLONOPIN) tablet 1 mg  1 mg Oral QHS Mojeed Akintayo  1 mg at 06/21/14 2134  . diphenhydrAMINE (BENADRYL) injection 50 mg  50 mg Intramuscular Q8H PRN Mojeed Akintayo   50 mg at 06/17/14 0206  . divalproex (DEPAKOTE ER) 24 hr tablet 1,000 mg  1,000 mg Oral QHS Mojeed Akintayo   1,000 mg at 06/21/14 2134  . divalproex (DEPAKOTE ER) 24 hr tablet 500 mg  500 mg Oral BID Mojeed Akintayo   500 mg at 06/22/14 0755  . docusate sodium (COLACE) capsule 100 mg  100 mg Oral BID Neita Garnet, MD   100 mg at 06/22/14 0755  . FLUoxetine (PROZAC) tablet 40 mg  40 mg Oral Daily Mojeed Akintayo   40 mg  at 06/22/14 0755  . labetalol (NORMODYNE) tablet 100 mg  100 mg Oral BID Shuvon Rankin, NP   100 mg at 06/22/14 0755  . latanoprost (XALATAN) 0.005 % ophthalmic solution 1 drop  1 drop Both Eyes QHS Shuvon Rankin, NP   1 drop at 06/21/14 2135  . levothyroxine (SYNTHROID, LEVOTHROID) tablet 50 mcg  50 mcg Oral QAC breakfast Shuvon Rankin, NP   50 mcg at 06/22/14 0616  . magnesium hydroxide (MILK OF MAGNESIA) suspension 30 mL  30 mL Oral Daily PRN Shuvon Rankin, NP      . multivitamin with minerals tablet 1 tablet  1 tablet Oral Daily Shuvon Rankin, NP   1 tablet at 06/22/14 0755  . naproxen (NAPROSYN) tablet 375 mg  375 mg Oral BID PRN Elmarie Shiley, NP      . nicotine (NICODERM CQ - dosed in mg/24 hours) patch 21 mg  21 mg Transdermal Daily Shuvon Rankin, NP      . OLANZapine zydis (ZYPREXA) disintegrating tablet 10 mg  10 mg Oral Q8H PRN Mojeed Akintayo   10 mg at 06/20/14 0046  . pantoprazole (PROTONIX) EC tablet 40 mg  40 mg Oral Daily Mojeed Akintayo   40 mg at 06/22/14 0754  . simvastatin (ZOCOR) tablet 20 mg  20 mg Oral q1800 Shuvon Rankin, NP   20 mg at 06/21/14 1642  . spironolactone (ALDACTONE) tablet 25 mg  25 mg Oral Daily Shuvon Rankin, NP   25 mg at 06/22/14 0755  . ziprasidone (GEODON) injection 20 mg  20 mg Intramuscular Q8H PRN Mojeed Akintayo   20 mg at 06/17/14 0207    Lab Results:  Results for orders placed during the hospital encounter of 06/13/14 (from the past 48 hour(s))  VALPROIC ACID LEVEL     Status: None   Collection Time    06/21/14  6:28 AM      Result Value Ref Range   Valproic Acid Lvl 58.7  50.0 - 100.0 ug/mL   Comment: Performed at [facility]    Physical Findings: AIMS: Facial and Oral Movements Muscles of Facial Expression: None, normal Lips and Perioral Area: None, normal Jaw: None, normal Tongue: None, normal,Extremity Movements Upper (arms, wrists, hands, fingers): None, normal Lower (legs, knees, ankles, toes): None, normal, Trunk  Movements Neck, shoulders, hips: None, normal, Overall Severity Severity of abnormal movements (highest score from questions above): None, normal Incapacitation due to abnormal movements: None, normal Patient's awareness of abnormal movements (rate only patient's report): No Awareness, Dental Status Current problems with teeth and/or dentures?: No Does patient usually wear dentures?: No  CIWA:    COWS:     Assessment: Schizoaffective disorder.   Treatment Plan Summary: Daily contact with patient to assess and evaluate symptoms and progress in treatment Medication management See below  Plan: 1.  Continue inpatient treatment/support and milieu 2. Continue medications as below:  -Saphris 5 mgrs SL BID for psychosis  -Prozac 40 mg daily for depression  -Depakote ER 500mg  po BID (am+1200pm), 1000mg  po qhs for mood lability. -Clonazepam 1mg  po Qhs for insomnia/agitation. 3. Valproic acid level on 06/21/14 58.7.  4. Address health issues: Vitals reviewed and stable.   Medical Decision Making Problem Points:  Established problem, improving (1) and Review of last therapy session (1) Data Points:  Review or order clinical lab tests (1) Review of medication regiment & side effects (2)  I certify that inpatient services furnished can reasonably be expected to improve the patient's condition.   Merian Capron, MD  06/22/2014, 10:37 AM

## 2014-06-23 MED ORDER — ASENAPINE MALEATE 5 MG SL SUBL
5.0000 mg | SUBLINGUAL_TABLET | Freq: Every day | SUBLINGUAL | Status: DC
  Administered 2014-06-24: 5 mg via SUBLINGUAL
  Filled 2014-06-23 (×2): qty 1

## 2014-06-23 NOTE — [facility] Group Notes (Signed)
[place] Group Notes:  (Nursing/MHT/Case Management/Adjunct)  Date:  06/22/2014  Time:  9:00AM  Type of Therapy:  Self Inventory  Participation Level:  Active  Participation Quality:  Appropriate  Affect:  Appropriate  Cognitive:  Appropriate  Insight:  Good  Engagement in Group:  Engaged  Modes of Intervention:  Discussion  Summary of Progress/Problems:  Tretha Sciara 06/23/2014, 8:33 AM

## 2014-06-23 NOTE — Progress Notes (Signed)
Patient ID: Brandon Maynard, male   DOB: [birth year], 58 y.o.   MRN: [id number]    [facility] MD Progress Note  06/23/2014 10:50 AM Brandon Maynard  MRN:  [id number]  Subjective: Patient report: " My knee is not buckling as before. More steady on feet." I feel a little sleepy during the day.  Objective: Patient seen and his chart was reviewed. Patient endorses less racing toughts and fixation to his reason being here. Had some knee pain, which he reports is better with Tylenol. Gait was slow but ambulating reasonable and there have been no reports of any falls. The patient is accepting the group home placement but continues to worry about how this will change the structure of his life. His mood appears to be slowly stabilizing. Patient is feeling somewhat sedated during the day but no falls.  Total Time spent with patient: 20 minutes   ADL's:  Fair   Sleep: Fair   Appetite: Good  Suicidal Ideation:  Denies  Homicidal Ideation:  Denies  AEB (as evidenced by):  Psychiatric Specialty Exam: Physical Exam  Psychiatric: His mood appears not anxious. His affect is labile. His speech is not rapid and/or pressured. He is slowed. He does not express impulsivity or inappropriate judgment.    Review of Systems  Constitutional: Negative.  Negative for fever and chills.  HENT: Negative.   Eyes: Negative.   Respiratory: Negative.  Negative for cough and shortness of breath.   Cardiovascular: Negative.  Negative for chest pain.  Gastrointestinal: Negative.   Genitourinary: Negative.   Musculoskeletal: Negative.        Patient reports chronic knee pain.   Skin: Negative.   Neurological: Negative.   Endo/Heme/Allergies: Negative.   Psychiatric/Behavioral: Negative for depression and suicidal ideas. The patient is nervous/anxious.     Blood pressure 117/70, pulse 78, temperature 97.8 F (36.6 C), temperature source Oral, resp. rate 16, height 6\' 2"  (1.88 m), weight 240 lb (108.863 kg).Body mass index is 30.8  kg/(m^2).  General Appearance: Fairly Groomed  Engineer, water::  Good  Speech:  Normal Rate  Volume:  Normal  Mood:  Labile   Affect:  Anxious  Thought Process: Goal Directed   Orientation:  NA  Thought Content:  Rumination  Suicidal Thoughts:  No  Homicidal Thoughts:  No  Memory:  NA  Judgement:  Fair  Insight:  Lacking  Psychomotor Activity:  Normal  Concentration:  Good  Recall:  Good  Fund of Knowledge:Good  Language: Good  Akathisia:  Negative  Handed:  Right  AIMS (if indicated):     Assets:  Communication Skills Desire for Improvement Resilience  Sleep:  Number of Hours: 1.75   Musculoskeletal: Strength & Muscle Tone: within normal limits Gait & Station: normal Patient leans: N/A  Current Medications: Current Facility-Administered Medications  Medication Dose Route Frequency Provider Last Rate Last Dose  . acetaminophen (TYLENOL) tablet 650 mg  650 mg Oral Q6H PRN Mojeed Akintayo   650 mg at 06/23/14 0441  . alum & mag hydroxide-simeth (MAALOX/MYLANTA) 200-200-20 MG/5ML suspension 30 mL  30 mL Oral Q4H PRN Shuvon Rankin, NP      . ampicillin (PRINCIPEN) capsule 500 mg  500 mg Oral QPC breakfast Mojeed Akintayo   500 mg at 06/23/14 0743  . asenapine (SAPHRIS) sublingual tablet 5 mg  5 mg Sublingual 2 times per day Shuvon Rankin, NP   5 mg at 06/23/14 0744  . clonazePAM (KLONOPIN) tablet 1 mg  1 mg Oral QHS Mojeed Akintayo  1 mg at 06/22/14 2139  . diphenhydrAMINE (BENADRYL) injection 50 mg  50 mg Intramuscular Q8H PRN Mojeed Akintayo   50 mg at 06/17/14 0206  . divalproex (DEPAKOTE ER) 24 hr tablet 1,000 mg  1,000 mg Oral QHS Mojeed Akintayo   1,000 mg at 06/22/14 2139  . divalproex (DEPAKOTE ER) 24 hr tablet 500 mg  500 mg Oral BID Mojeed Akintayo   500 mg at 06/23/14 0744  . docusate sodium (COLACE) capsule 100 mg  100 mg Oral BID Neita Garnet, MD   100 mg at 06/23/14 0743  . FLUoxetine (PROZAC) tablet 40 mg  40 mg Oral Daily Mojeed Akintayo   40 mg at 06/23/14  0743  . labetalol (NORMODYNE) tablet 100 mg  100 mg Oral BID Shuvon Rankin, NP   100 mg at 06/23/14 0743  . latanoprost (XALATAN) 0.005 % ophthalmic solution 1 drop  1 drop Both Eyes QHS Shuvon Rankin, NP   1 drop at 06/22/14 2141  . levothyroxine (SYNTHROID, LEVOTHROID) tablet 50 mcg  50 mcg Oral QAC breakfast Shuvon Rankin, NP   50 mcg at 06/23/14 0631  . magnesium hydroxide (MILK OF MAGNESIA) suspension 30 mL  30 mL Oral Daily PRN Shuvon Rankin, NP   30 mL at 06/22/14 1312  . multivitamin with minerals tablet 1 tablet  1 tablet Oral Daily Shuvon Rankin, NP   1 tablet at 06/23/14 0743  . naproxen (NAPROSYN) tablet 375 mg  375 mg Oral BID PRN Elmarie Shiley, NP      . nicotine (NICODERM CQ - dosed in mg/24 hours) patch 21 mg  21 mg Transdermal Daily Shuvon Rankin, NP      . OLANZapine zydis (ZYPREXA) disintegrating tablet 10 mg  10 mg Oral Q8H PRN Mojeed Akintayo   10 mg at 06/23/14 0100  . pantoprazole (PROTONIX) EC tablet 40 mg  40 mg Oral Daily Mojeed Akintayo   40 mg at 06/23/14 0743  . simvastatin (ZOCOR) tablet 20 mg  20 mg Oral q1800 Shuvon Rankin, NP   20 mg at 06/22/14 1659  . spironolactone (ALDACTONE) tablet 25 mg  25 mg Oral Daily Shuvon Rankin, NP   25 mg at 06/23/14 0743  . ziprasidone (GEODON) injection 20 mg  20 mg Intramuscular Q8H PRN Mojeed Akintayo   20 mg at 06/17/14 0207    Lab Results:  No results found for this or any previous visit (from the past 29 hour(s)).  Physical Findings: AIMS: Facial and Oral Movements Muscles of Facial Expression: None, normal Lips and Perioral Area: None, normal Jaw: None, normal Tongue: None, normal,Extremity Movements Upper (arms, wrists, hands, fingers): None, normal Lower (legs, knees, ankles, toes): None, normal, Trunk Movements Neck, shoulders, hips: None, normal, Overall Severity Severity of abnormal movements (highest score from questions above): None, normal Incapacitation due to abnormal movements: None, normal Patient's  awareness of abnormal movements (rate only patient's report): No Awareness, Dental Status Current problems with teeth and/or dentures?: No Does patient usually wear dentures?: No  CIWA:    COWS:     Assessment: Schizoaffective disorder.   Treatment Plan Summary: Daily contact with patient to assess and evaluate symptoms and progress in treatment Medication management See below  Plan: 1. Continue inpatient treatment/support and milieu 2. Continue medications as below:  -will lower saphris to Qhs only considering his feeling of sedation during the day. -Prozac 40 mg daily for depression  -Depakote ER 500mg  po BID (am+1200pm), 1000mg  po qhs for mood lability. -Clonazepam 1mg  po Qhs for  insomnia/agitation. 3. Valproic acid level on 06/21/14 58.7.  4. Address health issues: Vitals reviewed and stable.   Medical Decision Making Problem Points:  Established problem, improving (1) and Review of last therapy session (1) Data Points:  Review or order clinical lab tests (1) Review of medication regiment & side effects (2)  I certify that inpatient services furnished can reasonably be expected to improve the patient's condition.   Merian Capron, MD  06/23/2014, 10:50 AM

## 2014-06-23 NOTE — BHH Group Notes (Signed)
[place] Group Notes:  (Nursing/MHT/Case Management/Adjunct)  Date:  06/23/2014  Time:  9:00AM Type of Therapy:  Self Inventory  Participation Level:  Did Not Attend    Summary of Progress/Problems:  Tretha Sciara 06/23/2014, 12:14 PM

## 2014-06-23 NOTE — BHH Group Notes (Signed)
[place] Group Notes:  (Nursing/MHT/Case Management/Adjunct)  Date:  06/23/2014  Time:  9:10AM  Type of Therapy:  Psychoeducational Skills  Participation Level:  Did Not Attend   Summary of Progress/Problems:  Tretha Sciara 06/23/2014, 12:15 PM

## 2014-06-23 NOTE — Progress Notes (Signed)
Patient ID: Brandon Maynard, male   DOB: 04-Mar-1956, 58 y.o.   MRN: [id number] D)   Has been less agitated this evening,  Spent some time in the dayroom watching tv, attended group,states is feeling a little better, and said in group he was in the middle of getting into a group home.  Stated his brother will be back on Monday from an out-of-town visit, and he may possibly be discharged then.  Has been calm and cooperative all evening, talked about his favorite movie, and said his only need was a cigarette.  Came to med window after group for hs meds, went to bed. A)  Will continue to monitor for safety per q 15 minute checks, will continue POC R)  Safety maintained

## 2014-06-23 NOTE — BHH Group Notes (Signed)
[place] Group Notes:  (Nursing/MHT/Case Management/Adjunct)  Date:  06/22/2014 Time:  9:10AM  Type of Therapy:  Psychoeducational Skills  Participation Level:  Active  Participation Quality:  Appropriate  Affect:  Appropriate  Cognitive:  Appropriate  Insight:  Good  Engagement in Group:  Engaged  Modes of Intervention:  Discussion  Summary of Progress/Problems:  Tretha Sciara 06/23/2014, 8:40 AM

## 2014-06-23 NOTE — Progress Notes (Signed)
Patient ID: Brandon Maynard, male   DOB: 1956/03/17, 58 y.o.   MRN: [id number] D)  Has been pleasant and cooperative this evening, stated he was probably going to be discharged tomorrow and would be going to a group home that his brothers have decided on.  Attended group  And had a snack, came to med window afterward for hs meds.  Affect is flat, rather quiet this evening.  Went to bed shortly after and has been resting quietly, was given tylenol for back ache, no other c/o's voiced. A)  Will continue to monitor for safety per q 15 minute checks, continue POC R)  Safety maintained.

## 2014-06-23 NOTE — Progress Notes (Signed)
Adult Psychoeducational Group Note  Date:  06/23/2014 Time:  8:38 PM  Group Topic/Focus:  Wrap-Up Group:   The focus of this group is to help patients review their daily goal of treatment and discuss progress on daily workbooks.  Participation Level:  Active  Participation Quality:  Appropriate  Affect:  Appropriate  Cognitive:  Appropriate  Insight: Appropriate  Engagement in Group:  Engaged  Modes of Intervention:  Discussion  Additional Comments: The patient expressed that he is looking forward to discharge.The patient said that he was told that he has placement for tomorrrow.  Nash Shearer 06/23/2014, 8:38 PM

## 2014-06-23 NOTE — Progress Notes (Signed)
D: pt denies si/hi/avh and pain. Pt stated he slept off and on last night. Pt appropriate on unit. Pt did nap more than usual this evening. Pt has a flat affect. Fair eye contact. Engaging well with others A: q 15 min safety check. Support and encouragement offered. scheduled medications given R: pt remains safe on unit. No signs of stress noted.

## 2014-06-23 NOTE — BHH Group Notes (Signed)
[place] LCSW Group Therapy  06/23/2014 3:51 PM  Type of Therapy:  Group Therapy  Participation Level:  Did Not Attend  Participation Quality:  N/A  Affect:  N/A  Cognitive:  N/A  Insight:  N/A  Engagement in Therapy:  N/A  Modes of Intervention:  Discussion, Education, Exploration, Rapport Building, Socialization and Support  Summary of Progress/Problems: Pt did not attend  Katha Hamming 06/23/2014, 3:51 PM

## 2014-06-24 MED ORDER — HYDROXYZINE HCL 50 MG PO TABS
50.0000 mg | ORAL_TABLET | Freq: Once | ORAL | Status: AC
  Administered 2014-06-24: 50 mg via ORAL
  Filled 2014-06-24 (×2): qty 1

## 2014-06-24 NOTE — BHH Group Notes (Signed)
[place] LCSW Group Therapy  06/24/2014 1:15 pm  Type of Therapy: Process Group Therapy  Participation Level:  Active  Participation Quality:  Appropriate  Affect:  Flat  Cognitive:  Oriented  Insight:  Improving  Engagement in Group:  Limited  Engagement in Therapy:  Limited  Modes of Intervention:  Activity, Clarification, Education, Problem-solving and Support  Summary of Progress/Problems: Today's group addressed the issue of overcoming obstacles.  Patients were asked to identify their biggest obstacle post d/c that stands in the way of their on-going success, and then problem solve as to how to manage this.  Brandon Maynard sat quietly through most of group.  When called on, he shared that he is worried about court since getting the paperwork from the sheriff, and does not want to go to jail.  I attempted to explain how IVC works once again.  He had no other concerns nor worries.  [name] 06/24/2014   2:47 PM

## 2014-06-24 NOTE — Progress Notes (Signed)
Patient ID: Brandon Maynard, male   DOB: [birth year], 58 y.o.   MRN: [id number]      [facility] MD Progress Note  06/24/2014 3:22 PM Brandon Maynard  MRN:  [id number]  Subjective: Patient states "I did not mean to act up. I was just worried about this court date. I thought it would keep me here longer. I took some medicine and I'm doing better now."   Objective: Patient seen and his chart was reviewed. Brandon Maynard's mood continues to be very labile at times. He became fixated on going to jail after receiving IVC papers from the sheriff. This morning the patient demanded to see a Provider. The patient became verbally and physically aggressive towards his nurse. Patient received a prn dose of Zyprexa. The patient can be extremely pleasant at times but his mood can fluctuate quickly. The patient later apologized to staff for his behavior. He remains compliant with medications.   Total Time spent with patient: 20 minutes   ADL's:  Fair   Sleep: Fair   Appetite: Good  Suicidal Ideation:  Denies  Homicidal Ideation:  Denies  AEB (as evidenced by):  Psychiatric Specialty Exam: Physical Exam  Psychiatric: His mood appears not anxious. His affect is labile. His speech is not rapid and/or pressured. He is aggressive. He does not express impulsivity or inappropriate judgment.    Review of Systems  Constitutional: Negative.  Negative for fever and chills.  HENT: Negative.   Eyes: Negative.   Respiratory: Negative.  Negative for cough and shortness of breath.   Cardiovascular: Negative.  Negative for chest pain.  Gastrointestinal: Negative.   Genitourinary: Negative.   Musculoskeletal: Negative.        Patient reports chronic knee pain.   Skin: Negative.   Neurological: Negative.   Endo/Heme/Allergies: Negative.   Psychiatric/Behavioral: Negative for depression and suicidal ideas. The patient is nervous/anxious.     Blood pressure 121/65, pulse 79, temperature 97.9 F (36.6 C), temperature source Oral, resp. rate  16, height 6\' 2"  (1.88 m), weight 108.863 kg (240 lb).Body mass index is 30.8 kg/(m^2).  General Appearance: Fairly Groomed  Engineer, water::  Good  Speech:  Normal Rate  Volume:  Normal  Mood:  Labile   Affect:  Anxious  Thought Process: Goal Directed   Orientation:  Full (Time, Place, and Person)  Thought Content:  Rumination  Suicidal Thoughts:  No  Homicidal Thoughts:  No  Memory:  NA  Judgement:  Fair  Insight:  Lacking  Psychomotor Activity:  Normal  Concentration:  Good  Recall:  Good  Fund of Knowledge:Good  Language: Good  Akathisia:  Negative  Handed:  Right  AIMS (if indicated):     Assets:  Communication Skills Desire for Improvement Resilience  Sleep:  Number of Hours: 4.5   Musculoskeletal: Strength & Muscle Tone: within normal limits Gait & Station: normal Patient leans: N/A  Current Medications: Current Facility-Administered Medications  Medication Dose Route Frequency Provider Last Rate Last Dose  . acetaminophen (TYLENOL) tablet 650 mg  650 mg Oral Q6H PRN Akirah Storck   650 mg at 06/24/14 0807  . alum & mag hydroxide-simeth (MAALOX/MYLANTA) 200-200-20 MG/5ML suspension 30 mL  30 mL Oral Q4H PRN Shuvon Rankin, NP      . ampicillin (PRINCIPEN) capsule 500 mg  500 mg Oral QPC breakfast Rakan Soffer   500 mg at 06/24/14 0802  . asenapine (SAPHRIS) sublingual tablet 5 mg  5 mg Sublingual QHS Merian Capron, MD      .  clonazePAM (KLONOPIN) tablet 1 mg  1 mg Oral QHS Keni Wafer   1 mg at 06/23/14 2151  . diphenhydrAMINE (BENADRYL) injection 50 mg  50 mg Intramuscular Q8H PRN Meghan Warshawsky   50 mg at 06/17/14 0206  . divalproex (DEPAKOTE ER) 24 hr tablet 1,000 mg  1,000 mg Oral QHS Amirr Achord   1,000 mg at 06/23/14 2151  . divalproex (DEPAKOTE ER) 24 hr tablet 500 mg  500 mg Oral BID Shylah Dossantos   500 mg at 06/24/14 1152  . docusate sodium (COLACE) capsule 100 mg  100 mg Oral BID Neita Garnet, MD   100 mg at 06/24/14 0802  . FLUoxetine  (PROZAC) tablet 40 mg  40 mg Oral Daily Donterius Filley   40 mg at 06/24/14 0802  . labetalol (NORMODYNE) tablet 100 mg  100 mg Oral BID Shuvon Rankin, NP   100 mg at 06/24/14 0802  . latanoprost (XALATAN) 0.005 % ophthalmic solution 1 drop  1 drop Both Eyes QHS Shuvon Rankin, NP   1 drop at 06/23/14 2152  . levothyroxine (SYNTHROID, LEVOTHROID) tablet 50 mcg  50 mcg Oral QAC breakfast Shuvon Rankin, NP   50 mcg at 06/24/14 7782  . magnesium hydroxide (MILK OF MAGNESIA) suspension 30 mL  30 mL Oral Daily PRN Shuvon Rankin, NP   30 mL at 06/22/14 1312  . multivitamin with minerals tablet 1 tablet  1 tablet Oral Daily Shuvon Rankin, NP   1 tablet at 06/24/14 0802  . naproxen (NAPROSYN) tablet 375 mg  375 mg Oral BID PRN Elmarie Shiley, NP      . nicotine (NICODERM CQ - dosed in mg/24 hours) patch 21 mg  21 mg Transdermal Daily Shuvon Rankin, NP      . OLANZapine zydis (ZYPREXA) disintegrating tablet 10 mg  10 mg Oral Q8H PRN Mahmood Boehringer   10 mg at 06/24/14 1030  . pantoprazole (PROTONIX) EC tablet 40 mg  40 mg Oral Daily Bianka Liberati   40 mg at 06/24/14 0801  . simvastatin (ZOCOR) tablet 20 mg  20 mg Oral q1800 Shuvon Rankin, NP   20 mg at 06/23/14 1623  . spironolactone (ALDACTONE) tablet 25 mg  25 mg Oral Daily Shuvon Rankin, NP   25 mg at 06/24/14 0802  . ziprasidone (GEODON) injection 20 mg  20 mg Intramuscular Q8H PRN Amiel Mccaffrey   20 mg at 06/17/14 0207    Lab Results:  No results found for this or any previous visit (from the past 50 hour(s)).  Physical Findings: AIMS: Facial and Oral Movements Muscles of Facial Expression: None, normal Lips and Perioral Area: None, normal Jaw: None, normal Tongue: None, normal,Extremity Movements Upper (arms, wrists, hands, fingers): None, normal Lower (legs, knees, ankles, toes): None, normal, Trunk Movements Neck, shoulders, hips: None, normal, Overall Severity Severity of abnormal movements (highest score from questions above): None,  normal Incapacitation due to abnormal movements: None, normal Patient's awareness of abnormal movements (rate only patient's report): No Awareness, Dental Status Current problems with teeth and/or dentures?: No Does patient usually wear dentures?: No  CIWA:    COWS:     Assessment: Schizoaffective disorder.   Treatment Plan Summary: Daily contact with patient to assess and evaluate symptoms and progress in treatment Medication management  Plan: 1. Continue inpatient treatment/support and milieu 2. Continue medications as below:  -Continue Saphris to 5 mg hs for psychosis -Prozac 40 mg daily for depression  -Depakote ER 500mg  po BID (am+1200pm), 1000mg  po qhs for mood  lability. -Clonazepam 1mg  po Qhs for insomnia/agitation. 3. Valproic acid level on 06/21/14 58.7.  4. Address health issues: Vitals reviewed and stable.  5. Anticipate discharge on Wednesday.   Medical Decision Making Problem Points:  Established problem, improving (1) and Review of last therapy session (1) Data Points:  Review or order clinical lab tests (1) Review of medication regiment & side effects (2)  I certify that inpatient services furnished can reasonably be expected to improve the patient's condition.   Elmarie Shiley, NP-C 06/24/2014, 3:22 PM   Patient seen, evaluated and I agree with notes by Nurse Practitioner. Corena Pilgrim, MD

## 2014-06-24 NOTE — Tx Team (Signed)
  Interdisciplinary Treatment Plan Update   Date Reviewed:  06/24/2014  Time Reviewed:  8:29 AM  Progress in Treatment:   Attending groups: Yes Participating in groups: Yes Taking medication as prescribed: Yes  Tolerating medication: Yes Family/Significant other contact made: Yes  Patient understands diagnosis: Yes  Discussing patient identified problems/goals with staff: Yes Medical problems stabilized or resolved: Yes Denies suicidal/homicidal ideation: Yes Patient has not harmed self or others: Yes  For review of initial/current patient goals, please see plan of care.  Estimated Length of Stay:  1-3 days  Reason for Continuation of Hospitalization: Medication stabilization Other; describe mood lability, impulsivity  New Problems/Goals identified:  N/A  Discharge Plan or Barriers:   Group Home, [facility]  Additional Comments:  :"I am very worried about the court date for my involuntary commitment. Also, I have not been sleeping well but I don't need much sleep.''   Patient continues to be irritable, labile, having racing thoughts and decreased need for sleep. His thought process has become more organized but his behavior remains unpredictable. He has a fixed delusions of going to jail after receiving IVC papers from the sheriff despite several assurances.   Attendees:  Signature: Corena Pilgrim, MD 06/24/2014 8:29 AM   Signature: Ripley Fraise, LCSW 06/24/2014 8:29 AM  Signature: Elmarie Shiley, NP 06/24/2014 8:29 AM  Signature: Mayra Neer, RN 06/24/2014 8:29 AM  Signature: Darrol Angel, RN 06/24/2014 8:29 AM  Signature:  06/24/2014 8:29 AM  Signature:   06/24/2014 8:29 AM  Signature:    Signature:    Signature:    Signature:    Signature:    Signature:      Scribe for Treatment Team:   Ripley Fraise, LCSW  06/24/2014 8:29 AM

## 2014-06-24 NOTE — Progress Notes (Signed)
Pt pleasant and cooperative on initial approach. Pt compliant with taking meds and attending groups this morning. Pt easily agitated and labile. Pt demanded to speak to MD this morning and was told by writer that he will be seeing the Np today. Pt became agitated and fixated on court papers he received this morning and continued to demand to see the MD after writer had explained  to the pt that he will be seen by the NP. Pt became loud, verbally aggressive, and threatening towards Probation officer. Pt made a fist and then charged towards Probation officer. At that time, the pt had to be redirected by MHT. A few minutes later, pt became apologetic, blaming his mental illness and confusion on his behavior. Pt was then given prn med to help decrease agitation.  Medications administered as ordered per MD. Verbal support given. Pt encouraged to attend groups. 15 minute checks performed for safety. Pt safety maintained at this time. 15 minute checks performed for safety pt requires redirecting for bizarre behaviors.  Pt safety maintained. Pt mood is unpredictable.

## 2014-06-25 MED ORDER — ASENAPINE MALEATE 5 MG SL SUBL
10.0000 mg | SUBLINGUAL_TABLET | Freq: Every day | SUBLINGUAL | Status: DC
  Administered 2014-06-25 – 2014-06-27 (×3): 10 mg via SUBLINGUAL
  Filled 2014-06-25 (×2): qty 2
  Filled 2014-06-25: qty 6
  Filled 2014-06-25: qty 2
  Filled 2014-06-25: qty 6

## 2014-06-25 NOTE — Progress Notes (Signed)
Adult Psychoeducational Group Note  Date:  06/25/2014 Time:  11:20 AM  Group Topic/Focus:  Recovery Goals:   The focus of this group is to identify appropriate goals for recovery and establish a plan to achieve them.  Participation Level:  None  Participation Quality:  Intrusive, Inattentive, Redirectable and Resistant  Affect:  Blunted, Defensive and Irritable  Cognitive:  Disorganized  Insight: Lacking and Limited  Engagement in Group:  Monopolizing, Off Topic, Poor and Resistant  Modes of Intervention:  Clarification, Discussion, Education and Limit-setting  Additional Comments:  Pt was very disruptive and had to be asked to leave the group session.  Tehillah Cipriani E 06/25/2014, 11:20 AM

## 2014-06-25 NOTE — BHH Group Notes (Signed)
[place] LCSW Group Therapy  06/25/2014 2:33 PM   Type of Therapy:  Group Therapy  Participation Level:  Active  Participation Quality:  Attentive  Affect:  Appropriate  Cognitive:  Appropriate  Insight:  Improving  Engagement in Therapy:  Engaged  Modes of Intervention:  Clarification, Education, Exploration and Socialization  Summary of Progress/Problems: Today's group focused on relapse prevention.  We defined the term, and then brainstormed on ways to prevent relapse. Brandon Maynard was focused on relapse in terms of mental health.  But he downplayed meds.  He cited follow up appointments and coping skills as the main factors to prevent relapse.  He shared how watching his favorite moves and TV shows on cable are what make him feel better.  Went into lengthy description of both, though I was eventually able to redirect him.  Brandon Maynard 06/25/2014 , 2:33 PM

## 2014-06-25 NOTE — Progress Notes (Signed)
Patient ID: Brandon Maynard, male   DOB: 06/11/1956, 58 y.o.   MRN: [id number]       [facility] MD Progress Note  06/25/2014 10:21 AM [name]  MRN:  [id number]  Subjective: Patient states:"I am very worried about the court date for my involuntary commitment. Also, I have not been sleeping well but I don't need much sleep.''   Objective: Patient seen and his chart was reviewed. Patient continues to be irritable, labile, having racing thoughts and decreased need for sleep. His thought process has become more organized but his behavior remains unpredictable. He has a fixed delusions of  going to jail after receiving IVC papers from the sheriff despite several assurances. He has been attending the unit milieu and compliant with his medications. Patient denies any adverse reactions to his medications.  Total Time spent with patient: 20 minutes   ADL's:  Fair   Sleep: Fair   Appetite: Good  Suicidal Ideation:  Denies  Homicidal Ideation:  Denies  AEB (as evidenced by):  Psychiatric Specialty Exam: Physical Exam  Psychiatric: His mood appears not anxious. His affect is labile. His speech is not rapid and/or pressured. He is aggressive. He does not express impulsivity or inappropriate judgment.    Review of Systems  Constitutional: Negative.  Negative for fever and chills.  HENT: Negative.   Eyes: Negative.   Respiratory: Negative.  Negative for cough and shortness of breath.   Cardiovascular: Negative.  Negative for chest pain.  Gastrointestinal: Negative.   Genitourinary: Negative.   Musculoskeletal: Negative.        Patient reports chronic knee pain.   Skin: Negative.   Neurological: Negative.   Endo/Heme/Allergies: Negative.   Psychiatric/Behavioral: Negative for depression and suicidal ideas. The patient is nervous/anxious.     Blood pressure 128/73, pulse 72, temperature 98.6 F (37 C), temperature source Oral, resp. rate 16, height 6\' 2"  (1.88 m), weight 108.863 kg (240 lb).Body mass  index is 30.8 kg/(m^2).  General Appearance: Fairly Groomed  Engineer, water::  Good  Speech:  Normal Rate  Volume:  Normal  Mood:  Labile   Affect:  Anxious  Thought Process: Goal Directed   Orientation:  Full (Time, Place, and Person)  Thought Content:  Rumination  Suicidal Thoughts:  No  Homicidal Thoughts:  No  Memory:  NA  Judgement:  Fair  Insight:  Lacking  Psychomotor Activity:  Normal  Concentration:  Good  Recall:  Good  Fund of Knowledge:Good  Language: Good  Akathisia:  Negative  Handed:  Right  AIMS (if indicated):     Assets:  Communication Skills Desire for Improvement Resilience  Sleep:  Number of Hours: 3.5   Musculoskeletal: Strength & Muscle Tone: within normal limits Gait & Station: normal Patient leans: N/A  Current Medications: Current Facility-Administered Medications  Medication Dose Route Frequency Provider Last Rate Last Dose  . acetaminophen (TYLENOL) tablet 650 mg  650 mg Oral Q6H PRN Tarri Guilfoil   650 mg at 06/25/14 0409  . alum & mag hydroxide-simeth (MAALOX/MYLANTA) 200-200-20 MG/5ML suspension 30 mL  30 mL Oral Q4H PRN Shuvon Rankin, NP      . ampicillin (PRINCIPEN) capsule 500 mg  500 mg Oral QPC breakfast Fatiha Guzy   500 mg at 06/25/14 1014  . asenapine (SAPHRIS) sublingual tablet 10 mg  10 mg Sublingual QHS Breandan People      . clonazePAM (KLONOPIN) tablet 1 mg  1 mg Oral QHS Donnarae Rae   1 mg at 06/24/14 2200  .  diphenhydrAMINE (BENADRYL) injection 50 mg  50 mg Intramuscular Q8H PRN Ambriella Kitt   50 mg at 06/17/14 0206  . divalproex (DEPAKOTE ER) 24 hr tablet 1,000 mg  1,000 mg Oral QHS Nemiah Bubar   1,000 mg at 06/24/14 2201  . divalproex (DEPAKOTE ER) 24 hr tablet 500 mg  500 mg Oral BID Karely Hurtado   500 mg at 06/25/14 0753  . docusate sodium (COLACE) capsule 100 mg  100 mg Oral BID Neita Garnet, MD   100 mg at 06/25/14 0753  . FLUoxetine (PROZAC) tablet 40 mg  40 mg Oral Daily Mikolaj Woolstenhulme   40 mg at  06/25/14 0757  . labetalol (NORMODYNE) tablet 100 mg  100 mg Oral BID Shuvon Rankin, NP   100 mg at 06/25/14 0754  . latanoprost (XALATAN) 0.005 % ophthalmic solution 1 drop  1 drop Both Eyes QHS Shuvon Rankin, NP   1 drop at 06/23/14 2152  . levothyroxine (SYNTHROID, LEVOTHROID) tablet 50 mcg  50 mcg Oral QAC breakfast Shuvon Rankin, NP   50 mcg at 06/25/14 0409  . magnesium hydroxide (MILK OF MAGNESIA) suspension 30 mL  30 mL Oral Daily PRN Shuvon Rankin, NP   30 mL at 06/22/14 1312  . multivitamin with minerals tablet 1 tablet  1 tablet Oral Daily Shuvon Rankin, NP   1 tablet at 06/25/14 0753  . naproxen (NAPROSYN) tablet 375 mg  375 mg Oral BID PRN Elmarie Shiley, NP      . nicotine (NICODERM CQ - dosed in mg/24 hours) patch 21 mg  21 mg Transdermal Daily Shuvon Rankin, NP      . OLANZapine zydis (ZYPREXA) disintegrating tablet 10 mg  10 mg Oral Q8H PRN Deonne Rooks   10 mg at 06/24/14 1030  . pantoprazole (PROTONIX) EC tablet 40 mg  40 mg Oral Daily Dashonna Chagnon   40 mg at 06/25/14 0754  . simvastatin (ZOCOR) tablet 20 mg  20 mg Oral q1800 Shuvon Rankin, NP   20 mg at 06/24/14 1641  . spironolactone (ALDACTONE) tablet 25 mg  25 mg Oral Daily Shuvon Rankin, NP   25 mg at 06/25/14 0753  . ziprasidone (GEODON) injection 20 mg  20 mg Intramuscular Q8H PRN Mckennah Kretchmer   20 mg at 06/17/14 0207    Lab Results:  No results found for this or any previous visit (from the past 42 hour(s)).  Physical Findings: AIMS: Facial and Oral Movements Muscles of Facial Expression: None, normal Lips and Perioral Area: None, normal Jaw: None, normal Tongue: None, normal,Extremity Movements Upper (arms, wrists, hands, fingers): None, normal Lower (legs, knees, ankles, toes): None, normal, Trunk Movements Neck, shoulders, hips: None, normal, Overall Severity Severity of abnormal movements (highest score from questions above): None, normal Incapacitation due to abnormal movements: None,  normal Patient's awareness of abnormal movements (rate only patient's report): No Awareness, Dental Status Current problems with teeth and/or dentures?: No Does patient usually wear dentures?: No  CIWA:    COWS:     Assessment: Schizoaffective disorder.   Treatment Plan Summary: Daily contact with patient to assess and evaluate symptoms and progress in treatment Medication management  Plan: 1. Continue inpatient treatment/support and milieu 2. Continue medications as below:  -Increase Saphris to 10mg  hs for psychosis/delusions/mood -Prozac 40 mg daily for depression  -Depakote ER 500mg  po BID (am+1200pm), 1000mg  po qhs for mood lability. -Clonazepam 1mg  po Qhs for insomnia/agitation. 3. Valproic acid level on 06/21/14 is 58.7.  4. Address health issues: Vitals reviewed  and stable.  5. Anticipate discharge on Wednesday.   Medical Decision Making Problem Points:  Established problem, improving (1) and Review of last therapy session (1) Data Points:  Review or order clinical lab tests (1) Review of medication regiment & side effects (2)  I certify that inpatient services furnished can reasonably be expected to improve the patient's condition.   Corena Pilgrim, MD 06/25/2014, 10:21 AM

## 2014-06-25 NOTE — Progress Notes (Signed)
Pt presents anxious this morning. Pt easily agitated and irritable. Writer observed pt calling his roommate fat and telling his roommate that he's going to roll off his bed and die. Pt is intrusive and overbearing. Pt requires redirecting by staff for inappropriate behaviors. Pt has poor insight and makes poor decisions. Pt verbally aggressive and threatening towards staff and pts. Pt compliant with taking meds and attending groups.  Medications administered as ordered per MD. Verbal support given. Pt encouraged to attend groups. 15 minute checks performed for safety. Pt will be a no roommate, per MD.  Pt safety maintained at this time.

## 2014-06-25 NOTE — Progress Notes (Signed)
Adult Psychoeducational Group Note  Date:  06/25/2014 Time:  9:35 PM  Group Topic/Focus:  Wrap-Up Group:   The focus of this group is to help patients review their daily goal of treatment and discuss progress on daily workbooks.  Participation Level:  Minimal  Participation Quality:  Appropriate  Affect:  Flat  Cognitive:  Oriented  Insight: Limited  Engagement in Group:  Limited  Modes of Intervention:  Socialization and Support  Additional Comments:  Patient attended and participated in group tonight. He reports having a good day. He had less ache and pains today. He advised that his Education officer, museum, Rod, assisted him with getting into an [facility]. Within the next 3-4 days things to be finalized. Today her went for his meals, took a shower and went for his groups.  Salley Scarlet [facility] 06/25/2014, 9:35 PM

## 2014-06-25 NOTE — Progress Notes (Signed)
D   Pt has been calm and cooperative   He said he was embarrassed after acting out this morning  He said he got it in his head he was going to jail   His mood is still labile and his thoughts are somewhat disorganized   He feels like he is doing better than last week  He continues to try to care take his roommate A   Verbal support given   Medications administered and effectiveness monitored   Discussed another way pt could have handled the situation this morning   Q 15 min checks R   Pt safe at present

## 2014-06-26 NOTE — Progress Notes (Signed)
Patient ID: Brandon Maynard, male   DOB: [birth year], 58 y.o.   MRN: [id number]        [facility] MD Progress Note  06/26/2014 2:43 PM Brandon Maynard  MRN:  [id number]  Subjective: Patient states:"I am tired of being here. I need to be released before the end of the day. I need my brother to call me. I am very anxious. I will just all 911 if I need to."   Objective: Patient seen and his chart was reviewed. Patient continues to be irritable, labile, and his behavior can be unpredictable. However, he is sleeping better with 5.75 hours being recorded for last night.  His thought process has become more organized but his behavior remains unpredictable. Arad became agitated today over not being discharged. Patient began to knock on all the windows and doors. He was informed that he would not be able to discharge tomorrow as planned if he could not remain calm. Patient was able to calm down after speaking to members of treatment team about his concerns.   Total Time spent with patient: 15 minutes   ADL's:  Fair   Sleep: Fair   Appetite: Good  Suicidal Ideation:  Denies  Homicidal Ideation:  Denies  AEB (as evidenced by):  Psychiatric Specialty Exam: Physical Exam  Psychiatric: His mood appears not anxious. His affect is labile. His speech is not rapid and/or pressured. He is aggressive. He does not express impulsivity or inappropriate judgment.    Review of Systems  Constitutional: Negative.  Negative for fever and chills.  HENT: Negative.   Eyes: Negative.   Respiratory: Negative.  Negative for cough and shortness of breath.   Cardiovascular: Negative.  Negative for chest pain.  Gastrointestinal: Negative.   Genitourinary: Negative.   Musculoskeletal: Negative.        Patient reports chronic knee pain.   Skin: Negative.   Neurological: Negative.   Endo/Heme/Allergies: Negative.   Psychiatric/Behavioral: Negative for depression and suicidal ideas. The patient is nervous/anxious.     Blood pressure  128/78, pulse 67, temperature 98.6 F (37 C), temperature source Oral, resp. rate 20, height 6\' 2"  (1.88 m), weight 108.863 kg (240 lb).Body mass index is 30.8 kg/(m^2).  General Appearance: Fairly Groomed  Engineer, water::  Good  Speech:  Normal Rate  Volume:  Normal  Mood:  Labile   Affect:  Anxious  Thought Process: Goal Directed   Orientation:  Full (Time, Place, and Person)  Thought Content:  Rumination  Suicidal Thoughts:  No  Homicidal Thoughts:  No  Memory:  NA  Judgement:  Fair  Insight:  Lacking  Psychomotor Activity:  Normal  Concentration:  Good  Recall:  Good  Fund of Knowledge:Good  Language: Good  Akathisia:  Negative  Handed:  Right  AIMS (if indicated):     Assets:  Communication Skills Desire for Improvement Resilience  Sleep:  Number of Hours: 5.75   Musculoskeletal: Strength & Muscle Tone: within normal limits Gait & Station: normal Patient leans: N/A  Current Medications: Current Facility-Administered Medications  Medication Dose Route Frequency Provider Last Rate Last Dose  . acetaminophen (TYLENOL) tablet 650 mg  650 mg Oral Q6H PRN Jarquez Mestre   650 mg at 06/26/14 0634  . alum & mag hydroxide-simeth (MAALOX/MYLANTA) 200-200-20 MG/5ML suspension 30 mL  30 mL Oral Q4H PRN Shuvon Rankin, NP      . ampicillin (PRINCIPEN) capsule 500 mg  500 mg Oral QPC breakfast Sanyia Dini   500 mg at 06/26/14 0816  .  asenapine (SAPHRIS) sublingual tablet 10 mg  10 mg Sublingual QHS Charmon Thorson   10 mg at 06/25/14 2155  . clonazePAM (KLONOPIN) tablet 1 mg  1 mg Oral QHS Annah Jasko   1 mg at 06/25/14 2155  . diphenhydrAMINE (BENADRYL) injection 50 mg  50 mg Intramuscular Q8H PRN Debar Plate   50 mg at 06/17/14 0206  . divalproex (DEPAKOTE ER) 24 hr tablet 1,000 mg  1,000 mg Oral QHS Kada Friesen   1,000 mg at 06/25/14 2155  . divalproex (DEPAKOTE ER) 24 hr tablet 500 mg  500 mg Oral BID Dornell Grasmick   500 mg at 06/26/14 1151  . docusate sodium  (COLACE) capsule 100 mg  100 mg Oral BID Neita Garnet, MD   100 mg at 06/26/14 0802  . FLUoxetine (PROZAC) tablet 40 mg  40 mg Oral Daily Alyzah Pelly   40 mg at 06/26/14 0802  . labetalol (NORMODYNE) tablet 100 mg  100 mg Oral BID Shuvon Rankin, NP   100 mg at 06/26/14 0803  . latanoprost (XALATAN) 0.005 % ophthalmic solution 1 drop  1 drop Both Eyes QHS Shuvon Rankin, NP   1 drop at 06/23/14 2152  . levothyroxine (SYNTHROID, LEVOTHROID) tablet 50 mcg  50 mcg Oral QAC breakfast Shuvon Rankin, NP   50 mcg at 06/26/14 0527  . magnesium hydroxide (MILK OF MAGNESIA) suspension 30 mL  30 mL Oral Daily PRN Shuvon Rankin, NP   30 mL at 06/22/14 1312  . multivitamin with minerals tablet 1 tablet  1 tablet Oral Daily Shuvon Rankin, NP   1 tablet at 06/26/14 0802  . naproxen (NAPROSYN) tablet 375 mg  375 mg Oral BID PRN Elmarie Shiley, NP      . nicotine (NICODERM CQ - dosed in mg/24 hours) patch 21 mg  21 mg Transdermal Daily Shuvon Rankin, NP      . OLANZapine zydis (ZYPREXA) disintegrating tablet 10 mg  10 mg Oral Q8H PRN Adeli Frost   10 mg at 06/25/14 1633  . pantoprazole (PROTONIX) EC tablet 40 mg  40 mg Oral Daily Zeba Luby   40 mg at 06/26/14 0802  . simvastatin (ZOCOR) tablet 20 mg  20 mg Oral q1800 Shuvon Rankin, NP   20 mg at 06/25/14 1708  . spironolactone (ALDACTONE) tablet 25 mg  25 mg Oral Daily Shuvon Rankin, NP   25 mg at 06/26/14 0802  . ziprasidone (GEODON) injection 20 mg  20 mg Intramuscular Q8H PRN Senna Lape   20 mg at 06/17/14 0207    Lab Results:  No results found for this or any previous visit (from the past 90 hour(s)).  Physical Findings: AIMS: Facial and Oral Movements Muscles of Facial Expression: None, normal Lips and Perioral Area: None, normal Jaw: None, normal Tongue: None, normal,Extremity Movements Upper (arms, wrists, hands, fingers): None, normal Lower (legs, knees, ankles, toes): None, normal, Trunk Movements Neck, shoulders, hips: None,  normal, Overall Severity Severity of abnormal movements (highest score from questions above): None, normal Incapacitation due to abnormal movements: None, normal Patient's awareness of abnormal movements (rate only patient's report): No Awareness, Dental Status Current problems with teeth and/or dentures?: No Does patient usually wear dentures?: No  CIWA:    COWS:     Assessment: Schizoaffective disorder.   Treatment Plan Summary: Daily contact with patient to assess and evaluate symptoms and progress in treatment Medication management  Plan: 1. Continue inpatient treatment/support and milieu 2. Continue medications as below:  -Continue Saphris to 10mg   hs for psychosis/delusions/mood - Continue Prozac 40 mg daily for depression  -Depakote ER 500mg  po BID (am+1200pm), 1000mg  po qhs for mood lability. -Clonazepam 1mg  po Qhs for insomnia/agitation. 3. Depakote level in am.  4. Address health issues: Vitals reviewed and stable.  5. Anticipate discharge on Wednesday.   Medical Decision Making Problem Points:  Established problem, improving (1) and Review of last therapy session (1) Data Points:  Review or order clinical lab tests (1) Review of medication regiment & side effects (2)  I certify that inpatient services furnished can reasonably be expected to improve the patient's condition.   Elmarie Shiley, NP-C 06/26/2014, 2:43 PM   Patient seen, evaluated and I agree with notes by Nurse Practitioner. Corena Pilgrim, MD

## 2014-06-26 NOTE — Progress Notes (Signed)
[facility] Adult Case Management Discharge Plan :  Will you be returning to the same living situation after discharge: No. At discharge, do you have transportation home?:Yes,  [facility] staff Do you have the ability to pay for your medications:Yes,  MCD  Release of information consent forms completed and in the chart;  Patient's signature needed at discharge.  Patient to Follow up at: Follow-up Information   Follow up with [facility] On 07/01/2014. (Monday at 8:20AM with Everette Rank)    Contact information:   [place] [phone]      Patient denies SI/HI:   Yes,  yes    Safety Planning and Suicide Prevention discussed:  Yes,  yes  [name] 06/26/2014, 1:31 PM

## 2014-06-26 NOTE — BHH Suicide Risk Assessment (Signed)
[place] INPATIENT:  Family/Significant Other Suicide Prevention Education  Suicide Prevention Education:  Education Completed; No one has been identified by the patient as the family member/significant other with whom the patient will be residing, and identified as the person(s) who will aid the patient in the event of a mental health crisis (suicidal ideations/suicide attempt).  With written consent from the patient, the family member/significant other has been provided the following suicide prevention education, prior to the and/or following the discharge of the patient.  The suicide prevention education provided includes the following:  Suicide risk factors  Suicide prevention and interventions  National Suicide Hotline telephone number  [facility] assessment telephone number  [facility] Emergency Assistance [place] and/or Residential Mobile Crisis Unit telephone number  Request made of family/significant other to:  Remove weapons (e.g., guns, rifles, knives), all items previously/currently identified as safety concern.    Remove drugs/medications (over-the-counter, prescriptions, illicit drugs), all items previously/currently identified as a safety concern. The patient did not endorse SI at the time of admission, nor did the patient c/o SI during the stay here.  SPE not required.   The family member/significant other verbalizes understanding of the suicide prevention education information provided.  The family member/significant other agrees to remove the items of safety concern listed above.  [name] 06/26/2014, 1:34 PM

## 2014-06-26 NOTE — Progress Notes (Signed)
Adult Psychoeducational Group Note  Date:  06/26/2014 Time:  8:45 PM  Group Topic/Focus:  Wrap-Up Group:   The focus of this group is to help patients review their daily goal of treatment and discuss progress on daily workbooks.  Participation Level:  Active  Participation Quality:  Appropriate and Attentive  Affect:  Appropriate  Cognitive:  Appropriate  Insight: Appropriate  Engagement in Group:  Engaged  Modes of Intervention:  Discussion  Additional Comments:  Pt attended the wrap up group this evening and remained appropriate and engaged throughout the duration of the group. Pt ranked his day as a 6 because he found out today that he going to a group home and will have a place to live when he leaves [facility]. Pt also expressed excitement about leaving tomorrow.  Beryle Beams 06/26/2014, 8:45 PM

## 2014-06-26 NOTE — Progress Notes (Signed)
Pt presents anxious this morning. Pt easily agitated and labile. Pt continues to be verbally aggressive and threatening towards staff. Pt has poor insight and judgement. Pt irritable and continues to request for discharge. Pt mood is inconsistent and pt behaviors are unpredictable.  Pt compliant with taking meds and attending groups. Pt denies SI/HI/AVH.  Medications administered as ordered per MD. Verbal support given. Pt encouraged to attend groups. 15 minute checks performed for safety. Pt safety maintained at this time. Pt speech is loud, rapid and pressured. Pt has flight of ideas and disorganized thoughts.

## 2014-06-26 NOTE — Progress Notes (Signed)
D    Pt seems to be less irritable since he has a private room   His thinking seems more organized and logical and he is much calmer   Pt denies suicidal ideation but reports some sadness and depression   He said he does not know what the future holds in store for him for housing because he thinks he has been kicked out of his current living space A   Verbal support and encouragement given   Medications administered and effectiveness monitored   Q 15 min checks R   Pt safe at present

## 2014-06-26 NOTE — BHH Group Notes (Signed)
[name] Mental Health Association Group Therapy  13-Jul-2014 , 1:06 PM    Type of Therapy:  Mental Health Association Presentation  Participation Level:  Active  Participation Quality:  Attentive  Affect:  Blunted  Cognitive:  Oriented  Insight:  Limited  Engagement in Therapy:  Engaged  Modes of Intervention:  Discussion, Education and Socialization  Summary of Progress/Problems:  Shanon Brow from [place] came to present his recovery story and play the guitar.  Sat quietly throughout.  Requested Grateful Dead song.  [name] 2014-07-13 , 1:06 PM

## 2014-06-26 NOTE — Progress Notes (Signed)
Patient ID: Brandon Maynard, male   DOB: 1956-01-27, 58 y.o.   MRN: [id number] D: client anxious about discharge tomorrow, "I'm going to a group home, I never been to one before" says day been "iffy" A: Writer introduced self to client, provided emotional support encouraged him to continue medications and report any changes to staff. Staff will monitor q71min for safety. R: client is safe on the unit.

## 2014-06-26 NOTE — Tx Team (Signed)
  Interdisciplinary Treatment Plan Update   Date Reviewed:  06/26/2014  Time Reviewed:  1:27 PM  Progress in Treatment:   Attending groups: Yes Participating in groups: Yes Taking medication as prescribed: Yes  Tolerating medication: Yes Family/Significant other contact made: Yes  Patient understands diagnosis: Yes  Discussing patient identified problems/goals with staff: Yes Medical problems stabilized or resolved: Yes Denies suicidal/homicidal ideation: Yes Patient has not harmed self or others: Yes  For review of initial/current patient goals, please see plan of care.  Estimated Length of Stay:  D/C tomorrow  Reason for Continuation of Hospitalization:   New Problems/Goals identified:  N/A  Discharge Plan or Barriers:   plans to transfer to [facility], follow up at [facility]  Additional Comments:  Attendees:  Signature: Corena Pilgrim, MD 06/26/2014 1:27 PM   Signature: Ripley Fraise, LCSW 06/26/2014 1:27 PM  Signature: Elmarie Shiley, NP 06/26/2014 1:27 PM  Signature: Mayra Neer, RN 06/26/2014 1:27 PM  Signature: Darrol Angel, RN 06/26/2014 1:27 PM  Signature:  06/26/2014 1:27 PM  Signature:   06/26/2014 1:27 PM  Signature:    Signature:    Signature:    Signature:    Signature:    Signature:      Scribe for Treatment Team:   Ripley Fraise, LCSW  06/26/2014 1:27 PM

## 2014-06-27 LAB — VALPROIC ACID LEVEL: VALPROIC ACID LVL: 68.7 ug/mL (ref 50.0–100.0)

## 2014-06-27 MED ORDER — SIMVASTATIN 20 MG PO TABS: 20.0000 mg | ORAL_TABLET | Freq: Every day | ORAL | Status: DC

## 2014-06-27 MED ORDER — TRAZODONE HCL 100 MG PO TABS: 50.0000 mg | ORAL_TABLET | Freq: Every day | ORAL | Status: DC

## 2014-06-27 MED ORDER — LABETALOL HCL 200 MG PO TABS: 100.0000 mg | ORAL_TABLET | Freq: Two times a day (BID) | ORAL | Status: DC

## 2014-06-27 MED ORDER — POLYETHYLENE GLYCOL 3350 17 GM/SCOOP PO POWD: 17.0000 g | Freq: Every day | ORAL | Status: DC

## 2014-06-27 MED ORDER — CLONAZEPAM 1 MG PO TABS: 1.0000 mg | ORAL_TABLET | Freq: Every day | ORAL | Status: DC

## 2014-06-27 MED ORDER — ESOMEPRAZOLE MAGNESIUM 40 MG PO CPDR: 40.0000 mg | DELAYED_RELEASE_CAPSULE | Freq: Every day | ORAL | Status: DC

## 2014-06-27 MED ORDER — DIVALPROEX SODIUM ER 500 MG PO TB24: 500.0000 mg | ORAL_TABLET | Freq: Two times a day (BID) | ORAL | Status: DC

## 2014-06-27 MED ORDER — ASENAPINE MALEATE 5 MG SL SUBL: 10.0000 mg | SUBLINGUAL_TABLET | Freq: Every day | SUBLINGUAL | Status: DC

## 2014-06-27 MED ORDER — FLUOXETINE HCL 20 MG PO TABS: 40.0000 mg | ORAL_TABLET | Freq: Every day | ORAL | Status: DC

## 2014-06-27 MED ORDER — DIVALPROEX SODIUM ER 500 MG PO TB24: 1000.0000 mg | ORAL_TABLET | Freq: Every day | ORAL | Status: DC

## 2014-06-27 MED ORDER — ADULT MULTIVITAMIN W/MINERALS CH: 1.0000 | ORAL_TABLET | Freq: Every day | ORAL | Status: DC

## 2014-06-27 MED ORDER — BIMATOPROST 0.01 % OP SOLN: 1.0000 [drp] | Freq: Every day | OPHTHALMIC | Status: DC

## 2014-06-27 MED ORDER — LEVOTHYROXINE SODIUM 50 MCG PO TABS: 50.0000 ug | ORAL_TABLET | Freq: Every day | ORAL | Status: DC

## 2014-06-27 MED ORDER — SPIRONOLACTONE 25 MG PO TABS: 25.0000 mg | ORAL_TABLET | Freq: Every day | ORAL | Status: DC

## 2014-06-27 NOTE — [facility] Suicide Risk Assessment (Addendum)
   Demographic Factors:  Male, Caucasian, Low socioeconomic status, Unemployed and homeless  Total Time spent with patient: 20 minutes  Psychiatric Specialty Exam: Physical Exam  Psychiatric: He has a normal mood and affect. His speech is normal and behavior is normal. Judgment and thought content normal. Cognition and memory are normal.    Review of Systems  Constitutional: Negative.   HENT: Negative.   Eyes: Negative.   Respiratory: Negative.   Cardiovascular: Negative.   Gastrointestinal: Negative.   Genitourinary: Negative.   Musculoskeletal: Negative.   Skin: Negative.   Neurological: Negative.   Endo/Heme/Allergies: Negative.   Psychiatric/Behavioral: Negative.     Blood pressure 128/74, pulse 89, temperature 97.6 F (36.4 C), temperature source Oral, resp. rate 18, height 6\' 2"  (1.88 m), weight 108.863 kg (240 lb), SpO2 99.00%.Body mass index is 30.8 kg/(m^2).  General Appearance: Fairly Groomed  Engineer, water::  Good  Speech:  Clear and Coherent  Volume:  Normal  Mood:  Euthymic  Affect:  Appropriate  Thought Process:  Goal Directed  Orientation:  Full (Time, Place, and Person)  Thought Content:  Negative  Suicidal Thoughts:  No  Homicidal Thoughts:  No  Memory:  Immediate;   Fair Recent;   Fair Remote;   Fair  Judgement:  Fair  Insight:  Fair  Psychomotor Activity:  Normal  Concentration:  Fair  Recall:  [employer]  Language: Fair  Akathisia:  No  Handed:  Right  AIMS (if indicated):     Assets:  Communication Skills Desire for Improvement Physical Health  Sleep:  Number of Hours: 4.75    Musculoskeletal: Strength & Muscle Tone: within normal limits Gait & Station: normal Patient leans: N/A   Mental Status Per Nursing Assessment::   On Admission:  NA  Current Mental Status by Physician: patient denies suicidal ideation, intent or plan  Loss Factors: Financial problems/change in socioeconomic status  Historical  Factors: poor impulse control  Risk Reduction Factors:   Sense of responsibility to family and Positive social support  Continued Clinical Symptoms:  Resolving delusion and mood symptoms  Cognitive Features That Contribute To Risk:  Closed-mindedness Polarized thinking    Suicide Risk:  Minimal: No identifiable suicidal ideation.  Patients presenting with no risk factors but with morbid ruminations; may be classified as minimal risk based on the severity of the depressive symptoms  Discharge Diagnoses:   AXIS I: Schizoaffective disorder-bipolar type   AXIS II:  Cluster B Traits AXIS III:   Past Medical History  Diagnosis Date  . DDD (degenerative disc disease)    AXIS IV:  other psychosocial or environmental problems and problems related to social environment AXIS V:  61-70 mild symptoms  Plan Of Care/Follow-up recommendations:  Activity:  as tolerated Diet:  healthy Tests:  Valproic Acid: 68.5 Other:  patient to keep his after care appointment  Is patient on multiple antipsychotic therapies at discharge:  No   Has Patient had three or more failed trials of antipsychotic monotherapy by history:  No  Recommended Plan for Multiple Antipsychotic Therapies: NA    Corena Pilgrim, MD 06/28/2014, 9:07 AM

## 2014-06-27 NOTE — Discharge Summary (Signed)
Physician Discharge Summary Note  Patient:  Brandon Maynard is an 58 y.o., male MRN:  [id number] DOB:  08-May-1956 Patient phone:  [phone] (home)  Patient address:   [address] [place] [address],  Total Time spent with patient: 20 minutes  Date of Admission:  06/13/2014 Date of Discharge: 06/28/14  Reason for Admission:  Acute mania, Aggression  Discharge Diagnoses: Principal Problem:   Schizoaffective disorder Active Problems:   Psychotic disorder   Psychiatric Specialty Exam: Physical Exam  Review of Systems  Constitutional: Negative.   HENT: Negative.   Eyes: Negative.   Respiratory: Negative.   Cardiovascular: Negative.   Gastrointestinal: Negative.   Genitourinary: Negative.   Musculoskeletal: Negative.   Skin: Negative.   Neurological: Negative.   Endo/Heme/Allergies: Negative.   Psychiatric/Behavioral: Negative.     Blood pressure 110/82, pulse 80, temperature 97.7 F (36.5 C), temperature source Oral, resp. rate 16, height 6\' 2"  (1.88 m), weight 108.863 kg (240 lb), SpO2 99.00%.Body mass index is 30.8 kg/(m^2).  See Physician SRA                                                  Past Psychiatric History: See H&P Diagnosis:  Hospitalizations:  Outpatient Care:  Substance Abuse Care:  Self-Mutilation:  Suicidal Attempts:  Violent Behaviors:   Musculoskeletal: Strength & Muscle Tone: within normal limits Gait & Station: normal Patient leans: N/A  DSM5:  AXIS I: Schizoaffective disorder-bipolar type  AXIS II: Cluster B Traits  AXIS III:  Past Medical History   Diagnosis  Date   .  DDD (degenerative disc disease)    AXIS IV: other psychosocial or environmental problems and problems related to social environment  AXIS V: 61-70 mild symptoms  Level of Care:  OP  Hospital Course:  Brandon Maynard is a 57 year old male who was brought into the [facility] by [employer] under IVC which was initiated by his Radiographer, therapeutic. The IVC  indicates that the patient's behavior has become more erratic and unpredictable. He has been yelling in the apartment complex to the point of disturbing other residents. Patient has also been reported to throw dishware and furniture out of his apartment nearly hitting other people. His brothers report patient has become more overwhelmed since their decision to place him in a group home. Patient also does not take his medications as prescribed. Notes in epic indicate that he stops taking his medications on a regular basis in order to detox. Patient states today during his psychiatric admission assessment "I was brought here. I was acting out at the apartment complex. They thought I would hurt myself. I was yelling. I got frustrated because all this stuff broke in my apartment. The maintenance people were ignoring me. And paranoia is just a part of my illness. I'm tired of taking medications after 45 years. I had my first break in 1975. I take my Depakote prn. That works for me." Notes in epic indicate that his brothers main concern is that the patient does not take his medications on a regular basis. His family is actively trying to get him placed in a group home.          Brandon Maynard was admitted to the adult 400 unit. He was evaluated and his symptoms were identified. Medication management was discussed and initiated. Patient was started  on Saphris for psychosis, Prozac 40 mg daily for depression, and Depakote 500 mg BID/1,000 mg hs for improved mood stability. He was oriented to the unit and encouraged to participate in unit programming. Medical problems were identified and treated appropriately. Home medication was restarted as needed.        The patient was evaluated each day by a clinical provider to ascertain the patient's response to treatment. The patient's continued to be very labile at times. Patient became physically and verbally towards staff at different times.  Improvement was noted by the patient's  report of decreasing symptoms, improved sleep and appetite, affect, medication tolerance, behavior, and participation in unit programming.  He was asked each day to complete a self inventory noting mood, mental status, pain, new symptoms, anxiety and concerns.         He responded well to medication and being in a therapeutic and supportive environment. Positive and appropriate behavior was noted and the patient was motivated for recovery. The patient worked closely with the treatment team and case manager to develop a discharge plan with appropriate goals. Patient agreed to go to a group home after his brother went to check it out. His family was concerned that the patient is no longer able to live independently. However, on the day of discharge the patient refused to go. After speaking with treatment team he agreed to discharge the following day to the group home. Coping skills, problem solving as well as relaxation therapies were also part of the unit programming.         By the day of discharge he was in much improved condition than upon admission.  Symptoms were reported as significantly decreased or resolved completely.  The patient denied SI/HI and voiced no AVH. He was motivated to continue taking medication with a goal of continued improvement in mental health. Dianna Cafaro was discharged home with a plan to follow up as noted below.  Consults:  psychiatry  Significant Diagnostic Studies:    Discharge Vitals:   Blood pressure 110/82, pulse 80, temperature 97.7 F (36.5 C), temperature source Oral, resp. rate 16, height 6\' 2"  (1.88 m), weight 108.863 kg (240 lb), SpO2 99.00%. Body mass index is 30.8 kg/(m^2). Lab Results:   Results for orders placed during the hospital encounter of 06/13/14 (from the past 72 hour(s))  VALPROIC ACID LEVEL     Status: None   Collection Time    06/27/14  6:15 AM      Result Value Ref Range   Valproic Acid Lvl 68.7  50.0 - 100.0 ug/mL   Comment: Performed at  [facility]    Physical Findings: AIMS: Facial and Oral Movements Muscles of Facial Expression: None, normal Lips and Perioral Area: None, normal Jaw: None, normal Tongue: None, normal,Extremity Movements Upper (arms, wrists, hands, fingers): None, normal Lower (legs, knees, ankles, toes): None, normal, Trunk Movements Neck, shoulders, hips: None, normal, Overall Severity Severity of abnormal movements (highest score from questions above): None, normal Incapacitation due to abnormal movements: None, normal Patient's awareness of abnormal movements (rate only patient's report): No Awareness, Dental Status Current problems with teeth and/or dentures?: No Does patient usually wear dentures?: No  CIWA:    COWS:     Psychiatric Specialty Exam: See Psychiatric Specialty Exam and Suicide Risk Assessment completed by Attending Physician prior to discharge.  Discharge destination:  Home  Is patient on multiple antipsychotic therapies at discharge:  No   Has Patient had three or more failed  trials of antipsychotic monotherapy by history:  No  Recommended Plan for Multiple Antipsychotic Therapies: NA      Discharge Instructions   Discharge instructions    Complete by:  As directed   Please follow up with your Primary Care Provider for further management of your medical problems.     Discharge instructions    Complete by:  As directed   Please follow up with your Primary Care Provider for further management of your medical problems.            Medication List    STOP taking these medications       ampicillin 500 MG capsule  Commonly known as:  PRINCIPEN     FLUoxetine 40 MG capsule  Commonly known as:  PROZAC  Replaced by:  FLUoxetine 20 MG tablet      TAKE these medications     Indication   acetaminophen-codeine 300-30 MG per tablet  Commonly known as:  TYLENOL #3  Take 1 tablet by mouth every 12 (twelve) hours as needed for moderate pain.      asenapine 5  MG Subl 24 hr tablet  Commonly known as:  SAPHRIS  Place 2 tablets (10 mg total) under the tongue at bedtime.   Indication:  Manic-Depression     bimatoprost 0.01 % Soln  Commonly known as:  LUMIGAN  Place 1 drop into both eyes at bedtime.   Indication:  Persistently Increased Pressure in the Eye     clonazePAM 1 MG tablet  Commonly known as:  KLONOPIN  Take 1 tablet (1 mg total) by mouth at bedtime.   Indication:  Manic-Depression, insomnia     divalproex 500 MG 24 hr tablet  Commonly known as:  DEPAKOTE ER  Take 2 tablets (1,000 mg total) by mouth at bedtime.   Indication:  Manic Phase of Manic-Depression     divalproex 500 MG 24 hr tablet  Commonly known as:  DEPAKOTE ER  Take 1 tablet (500 mg total) by mouth 2 (two) times daily.   Indication:  Manic Phase of Manic-Depression     esomeprazole 40 MG capsule  Commonly known as:  NEXIUM  Take 1 capsule (40 mg total) by mouth daily.   Indication:  Gastroesophageal Reflux Disease with Current Symptoms     FLUoxetine 20 MG tablet  Commonly known as:  PROZAC  Take 2 tablets (40 mg total) by mouth daily.   Indication:  Manic-Depression     labetalol 200 MG tablet  Commonly known as:  NORMODYNE  Take 0.5 tablets (100 mg total) by mouth 2 (two) times daily.   Indication:  High Blood Pressure     levothyroxine 50 MCG tablet  Commonly known as:  SYNTHROID, LEVOTHROID  Take 1 tablet (50 mcg total) by mouth daily.   Indication:  Underactive Thyroid     multivitamin with minerals Tabs tablet  Take 1 tablet by mouth daily.   Indication:  Vitamin Supplementation     polyethylene glycol powder powder  Commonly known as:  GLYCOLAX/MIRALAX  Take 17 g by mouth daily.   Indication:  Constipation     simvastatin 20 MG tablet  Commonly known as:  ZOCOR  Take 1 tablet (20 mg total) by mouth daily.   Indication:  Cerebrovascular Accident or Stroke     spironolactone 25 MG tablet  Commonly known as:  ALDACTONE  Take 1 tablet (25  mg total) by mouth daily.   Indication:  Edema, High Blood Pressure  traZODone 100 MG tablet  Commonly known as:  DESYREL  Take 0.5 tablets (50 mg total) by mouth at bedtime.   Indication:  Trouble Sleeping       Follow-up Information   Follow up with [facility] On 07/01/2014. (Monday at 8:20AM with Everette Rank)    Contact information:   [place] [phone]     Follow-up recommendations:   Activity: as tolerated  Diet: healthy  Tests: Valproic Acid: 68.5  Other: patient to keep his after care appointment   Comments:   Take all your medications as prescribed by your mental healthcare provider.  Report any adverse effects and or reactions from your medicines to your outpatient provider promptly.  Patient is instructed and cautioned to not engage in alcohol and or illegal drug use while on prescription medicines.  In the event of worsening symptoms, patient is instructed to call the crisis hotline, 911 and or go to the nearest ED for appropriate evaluation and treatment of symptoms.  Follow-up with your primary care provider for your other medical issues, concerns and or health care needs.   Total Discharge Time:  Greater than 30 minutes.  SignedElmarie Shiley NP-C 06/28/2014, 4:10 PM  Patient seen, evaluated and I agree with notes by Nurse Practitioner. Corena Pilgrim, MD

## 2014-06-27 NOTE — Progress Notes (Signed)
Pt stated that he will be going to a group home today and he is feeling very anxious about that. He doesn't know what it's going to be like so that makes him nervous. He hopes that he is able to make new friends at the group home and is looking forward to having cable t.v.

## 2014-06-27 NOTE — Progress Notes (Signed)
Patient ID: Brandon Maynard, male   DOB: December 27, 1955, 58 y.o.   MRN: [id number] D: client reports "I'm going to the group home tomorrow, my brothers checked it out and said it was all right" "my brothers said if I don't like it we can look for another one in a couple of months" A: Writer encouraged client to think positive about the move, continue medications and report any concerns. Staff will monitor q22min for safety. R: client is safe on the unit, attended group.

## 2014-06-27 NOTE — Progress Notes (Signed)
Patient ID: Brandon Maynard, male   DOB: [birth year], 58 y.o.   MRN: [id number]         [facility] MD Progress Note  06/27/2014 3:38 PM Brandon Maynard  MRN:  [id number]  Subjective: Patient states:"I'm nervous about going to a group home. I don't know what it's like there. No, I don't feel comfortable going there at all. I'm not going."   Objective:  Patient scheduled for discharge today. He was threatening members of treatment team yesterday due to not being discharged. However, when his ride arrived today he refused to leave [facility]. Patient continues to be very labile and is easily agitated. He appears to have poor ability to process information. His brother had agreed to check out the group home for Brandon Maynard, since the patient could not go himself. Brandon Maynard has become more and more anxious about his discharge as it approached. The social worker has contacted his brothers to inform them of the situation. Brandon Maynard has now agreed to be discharged tomorrow. Will likely need to be discharged with a prn medication due to mood lability. The patient's behavior continues to be very unpredictable.   Total Time spent with patient: 15 minutes   ADL's:  Fair   Sleep: Fair   Appetite: Good  Suicidal Ideation:  Denies  Homicidal Ideation:  Denies  AEB (as evidenced by):  Psychiatric Specialty Exam: Physical Exam  Psychiatric: His mood appears not anxious. His affect is labile. His speech is not rapid and/or pressured. He is aggressive. He does not express impulsivity or inappropriate judgment.    Review of Systems  Constitutional: Negative.  Negative for fever and chills.  HENT: Negative.   Eyes: Negative.   Respiratory: Negative.  Negative for cough and shortness of breath.   Cardiovascular: Negative.  Negative for chest pain.  Gastrointestinal: Negative.   Genitourinary: Negative.   Musculoskeletal: Negative.        Patient reports chronic knee pain.   Skin: Negative.   Neurological: Negative.   Endo/Heme/Allergies:  Negative.   Psychiatric/Behavioral: The patient is nervous/anxious.     Blood pressure 128/74, pulse 89, temperature 97.6 F (36.4 C), temperature source Oral, resp. rate 18, height 6\' 2"  (1.88 m), weight 108.863 kg (240 lb), SpO2 99.00%.Body mass index is 30.8 kg/(m^2).  General Appearance: Fairly Groomed  Engineer, water::  Good  Speech:  Normal Rate  Volume:  Normal  Mood:  Labile   Affect:  Anxious  Thought Process: Goal Directed   Orientation:  Full (Time, Place, and Person)  Thought Content:  Rumination  Suicidal Thoughts:  No  Homicidal Thoughts:  No  Memory:  NA  Judgement:  Fair  Insight:  Lacking  Psychomotor Activity:  Normal  Concentration:  Good  Recall:  Good  Fund of Knowledge:Good  Language: Good  Akathisia:  Negative  Handed:  Right  AIMS (if indicated):     Assets:  Communication Skills Desire for Improvement Resilience  Sleep:  Number of Hours: 4.75   Musculoskeletal: Strength & Muscle Tone: within normal limits Gait & Station: normal Patient leans: N/A  Current Medications: Current Facility-Administered Medications  Medication Dose Route Frequency Provider Last Rate Last Dose  . acetaminophen (TYLENOL) tablet 650 mg  650 mg Oral Q6H PRN Aylen Rambert   650 mg at 06/26/14 2121  . alum & mag hydroxide-simeth (MAALOX/MYLANTA) 200-200-20 MG/5ML suspension 30 mL  30 mL Oral Q4H PRN Shuvon Rankin, NP      . ampicillin (PRINCIPEN) capsule 500 mg  500 mg Oral  QPC breakfast Apryle Stowell   500 mg at 06/27/14 0759  . asenapine (SAPHRIS) sublingual tablet 10 mg  10 mg Sublingual QHS Maris Abascal   10 mg at 06/26/14 2119  . clonazePAM (KLONOPIN) tablet 1 mg  1 mg Oral QHS Marlin Jarrard   1 mg at 06/26/14 2119  . diphenhydrAMINE (BENADRYL) injection 50 mg  50 mg Intramuscular Q8H PRN Karlene Southard   50 mg at 06/17/14 0206  . divalproex (DEPAKOTE ER) 24 hr tablet 1,000 mg  1,000 mg Oral QHS Kieren Ricci   1,000 mg at 06/26/14 2119  . divalproex (DEPAKOTE  ER) 24 hr tablet 500 mg  500 mg Oral BID Yashas Camilli   500 mg at 06/27/14 1257  . docusate sodium (COLACE) capsule 100 mg  100 mg Oral BID Neita Garnet, MD   100 mg at 06/27/14 0759  . FLUoxetine (PROZAC) tablet 40 mg  40 mg Oral Daily Reinette Cuneo   40 mg at 06/27/14 0759  . labetalol (NORMODYNE) tablet 100 mg  100 mg Oral BID Shuvon Rankin, NP   100 mg at 06/27/14 0759  . latanoprost (XALATAN) 0.005 % ophthalmic solution 1 drop  1 drop Both Eyes QHS Shuvon Rankin, NP   1 drop at 06/23/14 2152  . levothyroxine (SYNTHROID, LEVOTHROID) tablet 50 mcg  50 mcg Oral QAC breakfast Shuvon Rankin, NP   50 mcg at 06/27/14 0626  . magnesium hydroxide (MILK OF MAGNESIA) suspension 30 mL  30 mL Oral Daily PRN Shuvon Rankin, NP   30 mL at 06/22/14 1312  . multivitamin with minerals tablet 1 tablet  1 tablet Oral Daily Shuvon Rankin, NP   1 tablet at 06/27/14 0759  . naproxen (NAPROSYN) tablet 375 mg  375 mg Oral BID PRN Elmarie Shiley, NP      . nicotine (NICODERM CQ - dosed in mg/24 hours) patch 21 mg  21 mg Transdermal Daily Shuvon Rankin, NP      . OLANZapine zydis (ZYPREXA) disintegrating tablet 10 mg  10 mg Oral Q8H PRN Maria Gallicchio   10 mg at 06/27/14 0215  . pantoprazole (PROTONIX) EC tablet 40 mg  40 mg Oral Daily Rahsaan Weakland   40 mg at 06/27/14 0759  . simvastatin (ZOCOR) tablet 20 mg  20 mg Oral q1800 Shuvon Rankin, NP   20 mg at 06/26/14 1700  . spironolactone (ALDACTONE) tablet 25 mg  25 mg Oral Daily Shuvon Rankin, NP   25 mg at 06/27/14 0759  . ziprasidone (GEODON) injection 20 mg  20 mg Intramuscular Q8H PRN Jarryn Altland   20 mg at 06/17/14 0207    Lab Results:  Results for orders placed during the hospital encounter of 06/13/14 (from the past 48 hour(s))  VALPROIC ACID LEVEL     Status: None   Collection Time    06/27/14  6:15 AM      Result Value Ref Range   Valproic Acid Lvl 68.7  50.0 - 100.0 ug/mL   Comment: Performed at [facility]    Physical  Findings: AIMS: Facial and Oral Movements Muscles of Facial Expression: None, normal Lips and Perioral Area: None, normal Jaw: None, normal Tongue: None, normal,Extremity Movements Upper (arms, wrists, hands, fingers): None, normal Lower (legs, knees, ankles, toes): None, normal, Trunk Movements Neck, shoulders, hips: None, normal, Overall Severity Severity of abnormal movements (highest score from questions above): None, normal Incapacitation due to abnormal movements: None, normal Patient's awareness of abnormal movements (rate only patient's report): No Awareness, Dental  Status Current problems with teeth and/or dentures?: No Does patient usually wear dentures?: No  CIWA:    COWS:     Assessment: Schizoaffective disorder.   Treatment Plan Summary: Daily contact with patient to assess and evaluate symptoms and progress in treatment Medication management  Plan: 1. Continue inpatient treatment/support and milieu 2. Continue medications as below:  -Continue Saphris to 10mg  hs for psychosis/delusions/mood - Continue Prozac 40 mg daily for depression  -Depakote ER 500mg  po BID (am+1200pm), 1000mg  po qhs for mood lability. -Clonazepam 1mg  po Qhs for insomnia/agitation. 3. Depakote level was therapeutic at 68.7.  4. Address health issues: Vitals reviewed and stable.  5. Anticipate discharge tomorrow.   Medical Decision Making Problem Points:  Established problem, improving (1) and Review of last therapy session (1) Data Points:  Review or order clinical lab tests (1) Review of medication regiment & side effects (2)  I certify that inpatient services furnished can reasonably be expected to improve the patient's condition.   Elmarie Shiley, NP-C 06/27/2014, 3:38 PM    Patient seen, evaluated and I agree with notes by Nurse Practitioner. Corena Pilgrim, MD

## 2014-06-28 NOTE — Progress Notes (Signed)
Patient ID: Brandon Maynard, male   DOB: 21-Jun-1956, 58 y.o.   MRN: [id number] Discharge Note. D. Patient pending for discharge, going to group home. Orders received for discharge. Discharge instructions reviewed with patient, AVS copy provided to patient. Rx given with free supply of medications. All belongings returned. FL2 also provided. Pt denies any SI/HI/A/V Hallucinations. Crisis services reviewed. No signs of acute decompensation. Pt escorted from unit to group home worker -[place].

## 2014-06-28 NOTE — [facility] Group Notes (Signed)
[facility] LCSW Aftercare Discharge Planning Group Note   06/28/2014 9:59 AM  Participation Quality:  Active   Mood/Affect:  Anxious  Depression Rating:  Depression decreasing per patient   Anxiety Rating:  Anxiety decreasing per patient   Thoughts of Suicide:  No Will you contract for safety?   Yes  Current AVH:  No  Plan for Discharge/Comments:  Patient to discharge to [facility]: Facility to where patient is discharging will pick him up.   Supports: Unspecified by patient   Milford Cage, Foye Haggart C

## 2014-07-03 NOTE — Progress Notes (Signed)
Patient Discharge Instructions:  After Visit Summary (AVS):   Faxed to:  07/03/14 Discharge Summary Note:   Faxed to:  07/03/14 Psychiatric Admission Assessment Note:   Faxed to:  07/03/14 Suicide Risk Assessment - Discharge Assessment:   Faxed to:  07/03/14 Faxed/Sent to the Next Level Care provider:  07/03/14 Faxed to [facility] @ [place], 07/03/2014, 2:04 PM

## 2015-02-14 ENCOUNTER — Ambulatory Visit: Payer: Self-pay | Admitting: Podiatry

## 2015-07-14 ENCOUNTER — Emergency Department
Admission: EM | Admit: 2015-07-14 | Discharge: 2015-07-16 | Disposition: A | Discharge status: Other Acute Inpatient Hospital | Payer: Medicare Other | Attending: Emergency Medicine | Admitting: Emergency Medicine

## 2015-07-14 ENCOUNTER — Encounter: Payer: Self-pay | Admitting: Emergency Medicine

## 2015-07-14 DIAGNOSIS — F309 Manic episode, unspecified: Secondary | ICD-10-CM | POA: Diagnosis present

## 2015-07-14 DIAGNOSIS — F250 Schizoaffective disorder, bipolar type: Secondary | ICD-10-CM | POA: Diagnosis not present

## 2015-07-14 DIAGNOSIS — Z87891 Personal history of nicotine dependence: Secondary | ICD-10-CM | POA: Insufficient documentation

## 2015-07-14 DIAGNOSIS — R456 Violent behavior: Secondary | ICD-10-CM

## 2015-07-14 DIAGNOSIS — F3010 Manic episode without psychotic symptoms, unspecified: Secondary | ICD-10-CM

## 2015-07-14 DIAGNOSIS — Z79899 Other long term (current) drug therapy: Secondary | ICD-10-CM | POA: Diagnosis not present

## 2015-07-14 LAB — URINE DRUG SCREEN, QUALITATIVE (ARMC ONLY)
Amphetamines, Ur Screen: NOT DETECTED
BARBITURATES, UR SCREEN: NOT DETECTED
Benzodiazepine, Ur Scrn: NOT DETECTED
COCAINE METABOLITE, UR ~~LOC~~: NOT DETECTED
Cannabinoid 50 Ng, Ur ~~LOC~~: NOT DETECTED
MDMA (Ecstasy)Ur Screen: NOT DETECTED
Methadone Scn, Ur: NOT DETECTED
OPIATE, UR SCREEN: NOT DETECTED
PHENCYCLIDINE (PCP) UR S: NOT DETECTED
Tricyclic, Ur Screen: NOT DETECTED

## 2015-07-14 LAB — BASIC METABOLIC PANEL
Anion gap: 8 (ref 5–15)
BUN: 20 mg/dL (ref 6–20)
CO2: 26 mmol/L (ref 22–32)
Calcium: 9.6 mg/dL (ref 8.9–10.3)
Chloride: 109 mmol/L (ref 101–111)
Creatinine, Ser: 2.15 mg/dL — ABNORMAL HIGH (ref 0.61–1.24)
GFR calc Af Amer: 36 mL/min — ABNORMAL LOW (ref >60)
GFR, EST NON AFRICAN AMERICAN: 31 mL/min — AB (ref >60)
GLUCOSE: 92 mg/dL (ref 65–99)
POTASSIUM: 4.4 mmol/L (ref 3.5–5.1)
SODIUM: 143 mmol/L (ref 135–145)

## 2015-07-14 LAB — ETHANOL: ALCOHOL ETHYL (B): 8 mg/dL — AB (ref <5)

## 2015-07-14 LAB — URINALYSIS COMPLETE WITH MICROSCOPIC (ARMC ONLY)
BILIRUBIN URINE: NEGATIVE
Bacteria, UA: NONE SEEN
GLUCOSE, UA: NEGATIVE mg/dL
HGB URINE DIPSTICK: NEGATIVE
KETONES UR: NEGATIVE mg/dL
LEUKOCYTES UA: NEGATIVE
Nitrite: NEGATIVE
PH: 7 (ref 5.0–8.0)
Protein, ur: NEGATIVE mg/dL
SPECIFIC GRAVITY, URINE: 1.003 — AB (ref 1.005–1.030)
Squamous Epithelial / LPF: NONE SEEN
WBC, UA: NONE SEEN WBC/hpf (ref 0–5)

## 2015-07-14 LAB — CBC
HCT: 36.4 % — ABNORMAL LOW (ref 40.0–52.0)
Hemoglobin: 12.2 g/dL — ABNORMAL LOW (ref 13.0–18.0)
MCH: 30.4 pg (ref 26.0–34.0)
MCHC: 33.5 g/dL (ref 32.0–36.0)
MCV: 90.7 fL (ref 80.0–100.0)
Platelets: 93 10*3/uL — ABNORMAL LOW (ref 150–440)
RBC: 4.02 MIL/uL — ABNORMAL LOW (ref 4.40–5.90)
RDW: 13.9 % (ref 11.5–14.5)
WBC: 5.8 10*3/uL (ref 3.8–10.6)

## 2015-07-14 LAB — VALPROIC ACID LEVEL: VALPROIC ACID LVL: 41 ug/mL — AB (ref 50.0–100.0)
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
## 2015-07-14 NOTE — ED Notes (Signed)
Hand-off report given to Dominica Severin, Therapist, sports.
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
## 2015-07-14 NOTE — ED Notes (Signed)
BEHAVIORAL HEALTH ROUNDING Patient sleeping: No. Patient alert and oriented: yes Behavior appropriate: Yes.  ; If no, describe:  Nutrition and fluids offered: Yes  Toileting and hygiene offered: Yes  Sitter present: yes Law enforcement present: Yes  

## 2015-07-14 NOTE — ED Notes (Signed)

## 2015-07-14 NOTE — ED Notes (Signed)
ED Tech provided pt with x2 cups of orange juice and food tray.

## 2015-07-14 NOTE — ED Notes (Signed)
Pt. To B[facility] from ED ambulatory without difficulty, to room 2 . Report from [facility]. Pt. Is alert and oriented, warm and dry in no distress. Pt. Denies SI, HI, and AVH. Pt. Calm and cooperative. Pt. Made aware of security cameras and Q15 minute rounds. Pt. Encouraged to let Nursing staff know of any concerns or needs.

## 2015-07-14 NOTE — ED Notes (Signed)
Pt changed out of clothes and placed into hospital scrubs.

## 2015-07-15 NOTE — ED Notes (Signed)
Called the group home no naswer left message to call the ER back to be able to D/C pt.

## 2015-07-15 NOTE — ED Notes (Signed)
BEHAVIORAL HEALTH ROUNDING Patient sleeping: No. Patient alert and oriented: yes Behavior appropriate: Yes.  ; If no, describe:  Nutrition and fluids offered: Yes  Toileting and hygiene offered: Yes  Sitter present: no Law enforcement present: Yes  

## 2015-07-15 NOTE — ED Notes (Signed)
BEHAVIORAL HEALTH ROUNDING Patient sleeping: Yes.   Patient alert and oriented: not applicable Behavior appropriate: Yes.  ; If no, describe:  Nutrition and fluids offered: No Toileting and hygiene offered: Yes  Sitter present: no Law enforcement present: Yes

## 2015-07-15 NOTE — ED Notes (Signed)
Pt. Noted sleeping in room. No complaints or concerns voiced. No distress or abnormal behavior noted. Will continue to monitor with security cameras. Q 15 minute rounds continue. 

## 2015-07-15 NOTE — Progress Notes (Signed)
LCSW called Group home and spoke to [employer]. Patient is able to return once patient has been seen by psychiatrist. LCSW to call after 1pm to let [facility] Provider know when patient is discharged and ready for pick up.

## 2015-07-15 NOTE — ED Provider Notes (Signed)
-----------------------------------------   5:45 AM on 07/15/2015 -----------------------------------------   Blood pressure 165/74, pulse 59, temperature 98.3 F (36.8 C), temperature source Oral, resp. rate 18, height 6\' 3"  (1.905 m), weight 230 lb (104.327 kg), SpO2 100 %.  The patient had no acute events since last update.  Calm and cooperative at this time.  Disposition is pending per Psychiatry/Behavioral Medicine team recommendations.     Paulette Blanch, MD 07/15/15 [phone]

## 2015-07-15 NOTE — ED Notes (Signed)
Pt given lunch tray.

## 2015-07-15 NOTE — ED Notes (Signed)
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
## 2015-07-15 NOTE — BHH Counselor (Signed)
Discussed pt. with Psych MD (Dr. Dr. Franchot Mimes) and , pt. is able to d/c home when medically cleared. Informed BHU RN Data processing manager).

## 2015-07-15 NOTE — Progress Notes (Deleted)
LCSW called Group home and spoke to [employer]. Patient is able to return once patient has been seen by psychiatrist. LCSW to call after 1pm to let [facility] Provider know when patient is discharged and ready for pick up.

## 2015-07-15 NOTE — ED Notes (Signed)
Pt. Noted in room. No complaints or concerns voiced. No distress or abnormal behavior noted. Will continue to monitor with security cameras. Q 15 minute rounds continue. 

## 2015-07-15 NOTE — Discharge Instructions (Signed)
Anger Management Anger is a normal human emotion. However, anger can range from mild irritation to rage. When your anger becomes harmful to yourself or others, it is unhealthy anger.  CAUSES  There are many reasons for unhealthy anger. Many people learn how to express anger from observing how their family expressed anger. In troubled, chaotic, or abusive families, anger can be expressed as rage or even violence. Children can grow up never learning how healthy anger can be expressed. Factors that contribute to unhealthy anger include:   Drug or alcohol abuse.  Post-traumatic stress disorder.  Traumatic brain injury. COMPLICATIONS  People with unhealthy anger tend to overreact and retaliate against a real or imagined threat. The need to retaliate can turn into violence or verbal abuse against another person. Chronic anger can lead to health problems, such as hypertension, high blood pressure, and depression. TREATMENT  Exercising, relaxing, meditating, or writing out your feelings all can be beneficial in managing moderate anger. For unhealthy anger, the following methods may be used:  Cognitive-behavioral counseling (learning skills to change the thoughts that influence your mood).  Relaxation training.  Interpersonal counseling.  Assertive communication skills.  Medication. Document Released: 09/12/2007 Document Revised: 02/07/2012 Document Reviewed: 01/21/2011 [facility] Patient Information 2015 [place], [place]. This information is not intended to replace advice given to you by your health care provider. Make sure you discuss any questions you have with your health care provider. You have been seen in the Emergency Department (ED) today for a psychiatric complaint.  You have been evaluated by psychiatry and we believe you are safe to be discharged from the hospital.    Please return to the ED immediately if you have ANY thoughts of hurting yourself or anyone else, so that we may help  you.  Please avoid alcohol and drug use.  Follow up with your doctor and/or therapist as soon as possible regarding today's ED visit.   Please follow up any other recommendations and clinic appointments provided by the psychiatry team that saw you in the Emergency Department.

## 2015-07-15 NOTE — ED Notes (Signed)
Group home manager called back unable to pickup til 930am.

## 2015-07-15 NOTE — Progress Notes (Deleted)
LCSW called Ceph[facility] Richer [phone] and coordinated a visit for Wed 10:30am for a potential group home placement. Ms Vickki Muff has agreed to meet with the patient. [facility] [phone] awaiting call back

## 2015-07-15 NOTE — ED Notes (Signed)
BEHAVIORAL HEALTH ROUNDING Patient sleeping: No. Patient alert and oriented: yes Behavior appropriate: Yes.  ; If no, describe:  Nutrition and fluids offered: no  Toileting and hygiene offered: Yes  Sitter present: no Law enforcement present: Yes

## 2015-07-16 MED ORDER — ACETAMINOPHEN 325 MG PO TABS
ORAL_TABLET | ORAL | Status: AC
Start: 2015-07-16 — End: 2015-07-16
  Administered 2015-07-16: 625 mg
  Filled 2015-07-16: qty 2

## 2015-07-16 NOTE — ED Notes (Signed)
BEHAVIORAL HEALTH ROUNDING Patient sleeping: Yes.   Patient alert and oriented: sleeping Behavior appropriate: Yes.  ; If no, describe:  Nutrition and fluids offered: sleeping Toileting and hygiene offered: sleeping Sitter present: yes Law enforcement present: Yes

## 2015-07-16 NOTE — ED Notes (Addendum)
Pt. Noted in room he will walk out into dayroom at times.  Pt. Giving snack. No complaints or concerns voiced. No distress or abnormal behavior noted. Will continue to monitor with security cameras. Q 15 minute rounds continue.

## 2015-07-16 NOTE — ED Notes (Signed)
Pt. Noted in room sleeping;. No complaints or concerns voiced. No distress or abnormal behavior noted. Will continue to monitor with security cameras. Q 15 minute rounds continue. 

## 2015-07-16 NOTE — ED Notes (Signed)

## 2015-07-16 NOTE — ED Notes (Signed)
ENVIRONMENTAL ASSESSMENT Potentially harmful objects out of patient reach: Yes.   Personal belongings secured: Yes.   Patient dressed in hospital provided attire only: Yes.   Plastic bags out of patient reach: Yes.   Patient care equipment (cords, cables, call bells, lines, and drains) shortened, removed, or accounted for: Yes.   Equipment and supplies removed from bottom of stretcher: Yes.   Potentially toxic materials out of patient reach: Yes.   Sharps container removed or out of patient reach: Yes.     BEHAVIORAL HEALTH ROUNDING Patient sleeping: No. Patient alert and oriented: yes Behavior appropriate: Yes.  ; If no, describe:  Nutrition and fluids offered: yes Toileting and hygiene offered: Yes  Sitter present: q15 minute observations and security camera monitoring Law enforcement present: Yes  ODS

## 2015-07-16 NOTE — ED Notes (Signed)
ED BHU [place] Is the patient under IVC or is there intent for IVC: Yes.   Is the patient medically cleared: Yes.   Is there vacancy in the ED BHU: Yes.   Is the population mix appropriate for patient: Yes.   Is the patient awaiting placement in inpatient or outpatient setting: pt is up to d/c back to group home awaiting for transport Has the patient had a psychiatric consult: Yes.   Survey of unit performed for contraband, proper placement and condition of furniture, tampering with fixtures in bathroom, shower, and each patient room: Yes.  ; Findings:  APPEARANCE/BEHAVIOR Cooperative anxious at times pacing in the halls NEURO ASSESSMENT Orientation: time person and place Hallucinations: No.None noted (Hallucinations) Speech: Normal Gait: normal RESPIRATORY ASSESSMENT Normal expansion.  Clear to auscultation.  No rales, rhonchi, or wheezing. CARDIOVASCULAR ASSESSMENT regular rate and rhythm, S1, S2 normal, no murmur, click, rub or gallop GASTROINTESTINAL ASSESSMENT soft, nontender, BS WNL, no r/g EXTREMITIES normal strength, tone, and muscle mass PLAN OF CARE Provide calm/safe environment. Vital signs assessed twice daily. ED BHU Assessment once each 12-hour shift. Collaborate with intake RN daily or as condition indicates. Assure the ED provider has rounded once each shift. Provide and encourage hygiene. Provide redirection as needed. Assess for escalating behavior; address immediately and inform ED provider.   Educate the patient/family about BHU procedures/visitation: Yes.  ;

## 2015-07-16 NOTE — ED Notes (Signed)
Pt. Noted in room. No complaints or concerns voiced. No distress or abnormal behavior noted. Will continue to monitor with security cameras. Q 15 minute rounds continue. 

## 2015-07-16 NOTE — ED Notes (Signed)
Patient assigned to appropriate care area. Patient oriented to unit/care area: Informed that, for their safety, care areas are designed for safety and monitored by security cameras at all times; and visiting hours explained to patient. Patient verbalizes understanding, and verbal contract for safety obtained. 

## 2015-07-16 NOTE — ED Notes (Signed)
Pt to discharge back to his group home - they have arrived and are ready to transport him at this time  1/1 bags of belongings returned to him and he verbalized that he received back all belongings that he came here with  Discharge instructions reviewed with him and group home staff - they both verbalized agreement and understanding

## 2015-07-16 NOTE — ED Notes (Signed)
BEHAVIORAL HEALTH ROUNDING Patient sleeping: Yes.   Patient alert and oriented: sleeping Behavior appropriate: Yes.  ; If no, describe:  Nutrition and fluids offered: sleeping Toileting and hygiene offered: sleeping Sitter present: yes Law enforcement present: Yes  Pt. Noted in room. No complaints or concerns voiced. No distress or abnormal behavior noted. Will continue to monitor with security cameras. Q 15 minute rounds continue.

## 2015-07-16 NOTE — ED Notes (Addendum)
BEHAVIORAL HEALTH ROUNDING Patient sleeping: Yes.   Patient alert and oriented: sleeping Behavior appropriate: Yes.  ; If no, describe:  Nutrition and fluids offered: sleeping Toileting and hygiene offered: Sleeping  Sitter present: yes Law enforcement present: Yes  Pt. Noted in room. No complaints or concerns voiced. No distress or abnormal behavior noted. Will continue to monitor with security cameras. Q 15 minute rounds continue

## 2015-07-16 NOTE — ED Notes (Signed)
ED BHU [place] Is the patient under IVC or is there intent for IVC: no  Is the patient medically cleared: Yes.   Is there vacancy in the ED BHU: Yes.   Is the population mix appropriate for patient: Yes.   Is the patient awaiting placement in inpatient or outpatient setting: Yes - return to group home    Has the patient had a psychiatric consult: Yes.   Survey of unit performed for contraband, proper placement and condition of furniture, tampering with fixtures in bathroom, shower, and each patient room: Yes.  ; Findings:  APPEARANCE/BEHAVIOR Calm and cooperative NEURO ASSESSMENT Orientation: oriented x3  Denies pain Hallucinations: No.None noted (Hallucinations) Speech: Normal Gait: normal RESPIRATORY ASSESSMENT Even  Unlabored respirations  CARDIOVASCULAR ASSESSMENT Pulses equal   regular rate  Skin warm and dry   GASTROINTESTINAL ASSESSMENT no GI complaint EXTREMITIES Full ROM  PLAN OF CARE Provide calm/safe environment. Vital signs assessed twice daily. ED BHU Assessment once each 12-hour shift. Collaborate with intake RN daily or as condition indicates. Assure the ED provider has rounded once each shift. Provide and encourage hygiene. Provide redirection as needed. Assess for escalating behavior; address immediately and inform ED provider.  Assess family dynamic and appropriateness for visitation as needed: Yes.  ; If necessary, describe findings:  Educate the patient/family about BHU procedures/visitation: Yes.  ; If necessary, describe findings:

## 2015-07-16 NOTE — ED Notes (Signed)
Patient observed lying in bed with eyes closed  Even, unlabored respirations observed   NAD pt appears to be sleeping  I will continue to monitor along with every 15 minute visual observations and ongoing security camera monitoring    

## 2015-07-16 NOTE — ED Notes (Signed)
BEHAVIORAL HEALTH ROUNDING Patient sleeping: Yes.   Patient alert and oriented: sleeping Behavior appropriate: Yes.  ; If no, describe:  Nutrition and fluids offered: sleeping Toileting and hygiene offered: sleeping Sitter present: yes Law enforcement present: Yes  Pt. Noted in room. No complaints or concerns voiced. No distress or abnormal behavior noted. Will continue to monitor with security cameras. Q 15 minute rounds continue

## 2015-07-16 NOTE — ED Notes (Signed)
Pt. Noted in room at times walking in the hall. No complaints or concerns voiced. No distress or abnormal behavior noted. Will continue to monitor with security cameras. Q 15 minute rounds continue.

## 2015-07-16 NOTE — ED Notes (Signed)
Breakfast provided  Pt observed sitting in the chair requesting juice - juice provided in tray from dietary  Introduced self   Appropriate to stimulation  No verbalized needs or concerns at this time  NAD assessed  Continue to monitor

## 2016-02-05 ENCOUNTER — Encounter: Payer: Medicare Other | Admitting: Family Medicine

## 2016-02-06 NOTE — Progress Notes (Signed)
This encounter was created in error - please disregard.

## 2016-02-23 ENCOUNTER — Ambulatory Visit (INDEPENDENT_AMBULATORY_CARE_PROVIDER_SITE_OTHER): Payer: Medicare Other | Admitting: Family Medicine

## 2016-02-23 ENCOUNTER — Encounter: Payer: Self-pay | Admitting: Family Medicine

## 2016-02-23 VITALS — BP 100/64 | HR 80 | Ht 75.0 in | Wt 207.0 lb

## 2016-02-23 DIAGNOSIS — Z7689 Persons encountering health services in other specified circumstances: Secondary | ICD-10-CM

## 2016-02-23 DIAGNOSIS — K219 Gastro-esophageal reflux disease without esophagitis: Secondary | ICD-10-CM | POA: Diagnosis not present

## 2016-02-23 DIAGNOSIS — I10 Essential (primary) hypertension: Secondary | ICD-10-CM | POA: Diagnosis not present

## 2016-02-23 DIAGNOSIS — H6123 Impacted cerumen, bilateral: Secondary | ICD-10-CM

## 2016-02-23 DIAGNOSIS — E785 Hyperlipidemia, unspecified: Secondary | ICD-10-CM

## 2016-02-23 DIAGNOSIS — E039 Hypothyroidism, unspecified: Secondary | ICD-10-CM

## 2016-02-23 DIAGNOSIS — Z7189 Other specified counseling: Secondary | ICD-10-CM | POA: Diagnosis not present

## 2016-02-23 DIAGNOSIS — J301 Allergic rhinitis due to pollen: Secondary | ICD-10-CM | POA: Diagnosis not present

## 2016-02-23 MED ORDER — SIMVASTATIN 20 MG PO TABS: 20.0000 mg | ORAL_TABLET | Freq: Every day | ORAL | Status: DC

## 2016-02-23 MED ORDER — CARBAMIDE PEROXIDE 6.5 % OT SOLN: 5.0000 [drp] | Freq: Two times a day (BID) | OTIC | Status: DC

## 2016-02-23 MED ORDER — SPIRONOLACTONE 25 MG PO TABS: 25.0000 mg | ORAL_TABLET | Freq: Every day | ORAL | Status: DC

## 2016-02-23 MED ORDER — LABETALOL HCL 200 MG PO TABS: 100.0000 mg | ORAL_TABLET | Freq: Two times a day (BID) | ORAL | Status: DC

## 2016-02-23 MED ORDER — LORATADINE 10 MG PO TABS: 10.0000 mg | ORAL_TABLET | Freq: Every day | ORAL | Status: DC

## 2016-02-23 MED ORDER — LEVOTHYROXINE SODIUM 50 MCG PO TABS: 50.0000 ug | ORAL_TABLET | Freq: Every day | ORAL | Status: DC

## 2016-02-23 MED ORDER — ESOMEPRAZOLE MAGNESIUM 40 MG PO CPDR: 40.0000 mg | DELAYED_RELEASE_CAPSULE | Freq: Every day | ORAL | Status: DC

## 2016-02-23 NOTE — Progress Notes (Addendum)
Name: Brandon Maynard.   MRN: [id number]    DOB: 01/09/1956   Date:02/23/2016       Progress Note  Subjective  Chief Complaint  Chief Complaint  Patient presents with  . Gastroesophageal Reflux  . Hypothyroidism  . Hyperlipidemia  . Hypertension    Gastroesophageal Reflux He reports no abdominal pain, no belching, no chest pain, no choking, no coughing, no dysphagia, no early satiety, no globus sensation, no heartburn, no hoarse voice, no nausea, no sore throat, no stridor or no wheezing. This is a chronic problem. The current episode started more than 1 year ago. The problem occurs frequently. The problem has been gradually improving. The symptoms are aggravated by certain foods. Pertinent negatives include no anemia, fatigue, melena, muscle weakness, orthopnea or weight loss. He has tried a PPI for the symptoms. The treatment provided moderate relief.  Hyperlipidemia This is a chronic problem. The current episode started more than 1 year ago. The problem is controlled. Recent lipid tests were reviewed and are normal. He has no history of chronic renal disease. Factors aggravating his hyperlipidemia include smoking. Pertinent negatives include no chest pain, focal sensory loss, focal weakness, leg pain, myalgias or shortness of breath. Current antihyperlipidemic treatment includes statins. The current treatment provides mild improvement of lipids. There are no compliance problems.  Risk factors for coronary artery disease include dyslipidemia, hypertension and male sex.  Hypertension This is a chronic problem. The current episode started more than 1 year ago. The problem has been gradually improving since onset. The problem is controlled. Pertinent negatives include no anxiety, blurred vision, chest pain, headaches, malaise/fatigue, neck pain, orthopnea, palpitations, peripheral edema, PND, shortness of breath or sweats. There are no associated agents to hypertension. Risk factors for coronary artery  disease include smoking/tobacco exposure. Past treatments include diuretics and beta blockers. The current treatment provides moderate improvement. There are no compliance problems.  Hypertensive end-organ damage includes a thyroid problem. There is no history of angina, kidney disease, CAD/MI, CVA, heart failure, left ventricular hypertrophy, PVD, renovascular disease or retinopathy. There is no history of chronic renal disease or a hypertension causing med.  Thyroid Problem Presents for follow-up visit. Symptoms include depressed mood. Patient reports no anxiety, cold intolerance, constipation, diaphoresis, diarrhea, dry skin, fatigue, hair loss, hoarse voice, leg swelling, palpitations, weight gain or weight loss. The symptoms have been stable. Past treatments include levothyroxine. His past medical history is significant for hyperlipidemia. There is no history of atrial fibrillation or heart failure.    No problem-specific assessment & plan notes found for this encounter.   Past Medical History  Diagnosis Date  . Paranoid schizophrenia ([place])   . DDD (degenerative disc disease)   . 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 Bipolar disorder [facility])     Past Surgical History  Procedure Laterality Date  . Dermal abrasion  1973    No family history on file.  Social History   Social History  . Marital Status: Single    Spouse Name: N/A  . Number of Children: N/A  . Years of Education: N/A   Occupational History  . Not on file.   Social History Main Topics  . Smoking status: Current Every Day Smoker    Last Attempt to Quit: 04/09/1991  . Smokeless tobacco: Not on file  . Alcohol Use: No  . Drug Use: No  . Sexual Activity: Not on file   Other Topics Concern  . Not on file   Social History Narrative  Allergies  Allergen Reactions  . Tetracyclines & Related Hives     Review of Systems  Constitutional: Negative for fever, chills, weight loss, weight gain, malaise/fatigue,  diaphoresis and fatigue.  HENT: Negative for ear discharge, ear pain, hoarse voice and sore throat.   Eyes: Negative for blurred vision.  Respiratory: Negative for cough, sputum production, choking, shortness of breath and wheezing.   Cardiovascular: Negative for chest pain, palpitations, orthopnea, leg swelling and PND.  Gastrointestinal: Negative for heartburn, dysphagia, nausea, abdominal pain, diarrhea, constipation, blood in stool and melena.  Genitourinary: Negative for dysuria, urgency, frequency and hematuria.  Musculoskeletal: Negative for myalgias, back pain, joint pain, muscle weakness and neck pain.  Skin: Negative for rash.  Neurological: Negative for dizziness, tingling, sensory change, focal weakness and headaches.  Endo/Heme/Allergies: Negative for environmental allergies, cold intolerance and polydipsia. Does not bruise/bleed easily.  Psychiatric/Behavioral: Negative for depression and suicidal ideas. The patient is not nervous/anxious and does not have insomnia.      Objective  Filed Vitals:   02/23/16 1104  BP: 100/64  Pulse: 80  Height: 6\' 3"  (1.905 m)  Weight: 207 lb (93.895 kg)    Physical Exam  Constitutional: He is oriented to person, place, and time and well-developed, well-nourished, and in no distress.  HENT:  Head: Normocephalic.  Right Ear: External ear normal.  Left Ear: External ear normal.  Nose: Nose normal.  Mouth/Throat: Oropharynx is clear and moist.  Eyes: Conjunctivae and EOM are normal. Pupils are equal, round, and reactive to light. Right eye exhibits no discharge. Left eye exhibits no discharge. No scleral icterus.  Neck: Normal range of motion. Neck supple. No JVD present. No tracheal deviation present. No thyromegaly present.  Cardiovascular: Normal rate, regular rhythm, normal heart sounds and intact distal pulses.  Exam reveals no gallop and no friction rub.   No murmur heard. Pulmonary/Chest: Breath sounds normal. No respiratory  distress. He has no wheezes. He has no rales.  Abdominal: Soft. Bowel sounds are normal. He exhibits no mass. There is no hepatosplenomegaly. There is no tenderness. There is no rebound, no guarding and no CVA tenderness.  Musculoskeletal: Normal range of motion. He exhibits no edema or tenderness.  Lymphadenopathy:    He has no cervical adenopathy.  Neurological: He is alert and oriented to person, place, and time. He has normal sensation, normal strength, normal reflexes and intact cranial nerves. No cranial nerve deficit.  Skin: Skin is warm. No rash noted.  Psychiatric: Mood and affect normal.  Nursing note and vitals reviewed.     Assessment & Plan  Problem List Items Addressed This Visit    None    Visit Diagnoses    Encounter to establish care with new doctor    -  Primary    Hypothyroidism, unspecified hypothyroidism type        Relevant Medications    levothyroxine (SYNTHROID, LEVOTHROID) 50 MCG tablet    labetalol (NORMODYNE) 200 MG tablet    Other Relevant Orders    TSH    Essential hypertension        Relevant Medications    spironolactone (ALDACTONE) 25 MG tablet    simvastatin (ZOCOR) 20 MG tablet    labetalol (NORMODYNE) 200 MG tablet    Other Relevant Orders    Renal Function Panel    Hyperlipidemia        Relevant Medications    spironolactone (ALDACTONE) 25 MG tablet    simvastatin (ZOCOR) 20 MG tablet    labetalol (  NORMODYNE) 200 MG tablet    Other Relevant Orders    Lipid Profile    Allergic rhinitis due to pollen        Relevant Medications    loratadine (CLARITIN) 10 MG tablet    Gastroesophageal reflux disease, esophagitis presence not specified        Relevant Medications    esomeprazole (NEXIUM) 40 MG capsule    Cerumen impaction, bilateral        Relevant Medications    carbamide peroxide (DEBROX) 6.5 % otic solution         Dr. Deanna Jones [facility]  02/23/2016

## 2016-02-23 NOTE — Addendum Note (Signed)
Addended by: Juline Patch on: 02/23/2016 03:48 PM   Modules accepted: Orders

## 2016-02-24 ENCOUNTER — Other Ambulatory Visit: Payer: Self-pay

## 2016-02-24 DIAGNOSIS — R7989 Other specified abnormal findings of blood chemistry: Secondary | ICD-10-CM

## 2016-02-24 LAB — RENAL FUNCTION PANEL
Albumin: 4 g/dL (ref 3.6–4.8)
BUN / CREAT RATIO: 11 (ref 10–22)
BUN: 24 mg/dL (ref 8–27)
CALCIUM: 9.3 mg/dL (ref 8.6–10.2)
CHLORIDE: 102 mmol/L (ref 96–106)
CO2: 21 mmol/L (ref 18–29)
Creatinine, Ser: 2.2 mg/dL — ABNORMAL HIGH (ref 0.76–1.27)
GFR calc non Af Amer: 31 mL/min/{1.73_m2} — ABNORMAL LOW (ref >59)
GFR, EST AFRICAN AMERICAN: 36 mL/min/{1.73_m2} — AB (ref >59)
GLUCOSE: 73 mg/dL (ref 65–99)
POTASSIUM: 4.9 mmol/L (ref 3.5–5.2)
Phosphorus: 3.3 mg/dL (ref 2.5–4.5)
SODIUM: 141 mmol/L (ref 134–144)

## 2016-02-24 LAB — LIPID PANEL
Chol/HDL Ratio: 2.5 ratio units (ref 0.0–5.0)
Cholesterol, Total: 129 mg/dL (ref 100–199)
HDL: 51 mg/dL (ref >39)
LDL Calculated: 58 mg/dL (ref 0–99)
Triglycerides: 102 mg/dL (ref 0–149)
VLDL CHOLESTEROL CAL: 20 mg/dL (ref 5–40)

## 2016-02-24 LAB — TSH: TSH: 2.06 u[IU]/mL (ref 0.450–4.500)

## 2016-05-12 ENCOUNTER — Ambulatory Visit (INDEPENDENT_AMBULATORY_CARE_PROVIDER_SITE_OTHER): Payer: Medicare Other | Admitting: Family Medicine

## 2016-05-12 ENCOUNTER — Encounter: Payer: Self-pay | Admitting: Family Medicine

## 2016-05-12 VITALS — BP 110/70 | HR 72 | Ht 75.0 in | Wt 196.0 lb

## 2016-05-12 DIAGNOSIS — E039 Hypothyroidism, unspecified: Secondary | ICD-10-CM

## 2016-05-12 DIAGNOSIS — M5137 Other intervertebral disc degeneration, lumbosacral region: Secondary | ICD-10-CM | POA: Insufficient documentation

## 2016-05-12 DIAGNOSIS — N183 Chronic kidney disease, stage 3 unspecified: Secondary | ICD-10-CM

## 2016-05-12 DIAGNOSIS — E785 Hyperlipidemia, unspecified: Secondary | ICD-10-CM | POA: Diagnosis not present

## 2016-05-12 DIAGNOSIS — M51379 Other intervertebral disc degeneration, lumbosacral region without mention of lumbar back pain or lower extremity pain: Secondary | ICD-10-CM | POA: Insufficient documentation

## 2016-05-12 DIAGNOSIS — K219 Gastro-esophageal reflux disease without esophagitis: Secondary | ICD-10-CM | POA: Diagnosis not present

## 2016-05-12 DIAGNOSIS — D539 Nutritional anemia, unspecified: Secondary | ICD-10-CM | POA: Diagnosis not present

## 2016-05-12 DIAGNOSIS — I10 Essential (primary) hypertension: Secondary | ICD-10-CM | POA: Diagnosis not present

## 2016-05-12 MED ORDER — ESOMEPRAZOLE MAGNESIUM 40 MG PO CPDR: 40.0000 mg | DELAYED_RELEASE_CAPSULE | Freq: Every day | ORAL | Status: DC

## 2016-05-12 MED ORDER — SIMVASTATIN 20 MG PO TABS: 20.0000 mg | ORAL_TABLET | Freq: Every day | ORAL | Status: DC

## 2016-05-12 MED ORDER — LEVOTHYROXINE SODIUM 50 MCG PO TABS: 50.0000 ug | ORAL_TABLET | Freq: Every day | ORAL | Status: DC

## 2016-05-12 MED ORDER — LABETALOL HCL 200 MG PO TABS: 100.0000 mg | ORAL_TABLET | Freq: Two times a day (BID) | ORAL | Status: DC

## 2016-05-12 NOTE — Progress Notes (Signed)
Name: Brandon Maynard.   MRN: [id number]    DOB: June 04, 1956   Date:05/12/2016       Progress Note  Subjective  Chief Complaint  Chief Complaint  Patient presents with  . Hypothyroidism  . Gastroesophageal Reflux  . Hyperlipidemia    Gastroesophageal Reflux He reports no abdominal pain, no belching, no chest pain, no choking, no coughing, no dysphagia, no early satiety, no globus sensation, no heartburn, no hoarse voice, no nausea, no sore throat, no stridor, no tooth decay, no water brash or no wheezing. This is a chronic problem. The current episode started more than 1 year ago. The problem occurs occasionally. The problem has been waxing and waning. Nothing aggravates the symptoms. Pertinent negatives include no anemia, fatigue, melena, muscle weakness, orthopnea or weight loss. There are no known risk factors. He has tried nothing for the symptoms. The treatment provided no relief.  Hyperlipidemia This is a chronic problem. This is a new diagnosis. The problem is controlled. Recent lipid tests were reviewed and are normal. He has no history of chronic renal disease, diabetes, hypothyroidism, liver disease, obesity or nephrotic syndrome. There are no known factors aggravating his hyperlipidemia. Pertinent negatives include no chest pain, focal weakness, myalgias or shortness of breath. He is currently on no antihyperlipidemic treatment. The current treatment provides mild improvement of lipids. There are no compliance problems.  There are no known risk factors for coronary artery disease.    No problem-specific assessment & plan notes found for this encounter.   Past Medical History  Diagnosis Date  . Paranoid schizophrenia ([place])   . DDD (degenerative disc disease)   . 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 Bipolar disorder ([place])   . GERD (gastroesophageal reflux disease)   . Thyroid disease   . Hyperlipidemia     Past Surgical History  Procedure Laterality Date  . Dermal abrasion  1973     History reviewed. No pertinent family history.  Social History   Social History  . Marital Status: Single    Spouse Name: N/A  . Number of Children: N/A  . Years of Education: N/A   Occupational History  . Not on file.   Social History Main Topics  . Smoking status: Current Every Day Smoker    Last Attempt to Quit: 04/09/1991  . Smokeless tobacco: Not on file  . Alcohol Use: No  . Drug Use: No  . Sexual Activity: Not on file   Other Topics Concern  . Not on file   Social History Narrative    Allergies  Allergen Reactions  . Tetracyclines & Related Hives     Review of Systems  Constitutional: Negative for fever, chills, weight loss, malaise/fatigue and fatigue.  HENT: Negative for ear discharge, ear pain, hoarse voice and sore throat.   Eyes: Negative for blurred vision.  Respiratory: Negative for cough, sputum production, choking, shortness of breath and wheezing.   Cardiovascular: Negative for chest pain, palpitations and leg swelling.  Gastrointestinal: Negative for heartburn, dysphagia, nausea, abdominal pain, diarrhea, constipation, blood in stool and melena.  Genitourinary: Negative for dysuria, urgency, frequency and hematuria.  Musculoskeletal: Negative for myalgias, back pain, joint pain, muscle weakness and neck pain.  Skin: Negative for rash.  Neurological: Negative for dizziness, tingling, sensory change, focal weakness and headaches.  Endo/Heme/Allergies: Negative for environmental allergies and polydipsia. Does not bruise/bleed easily.  Psychiatric/Behavioral: Negative for depression and suicidal ideas. The patient is not nervous/anxious and does not have insomnia.  Objective  Filed Vitals:   05/12/16 1019  BP: 110/70  Pulse: 72  Height: 6\' 3"  (1.905 m)  Weight: 196 lb (88.905 kg)    Physical Exam  Constitutional: He is oriented to person, place, and time and well-developed, well-nourished, and in no distress.  HENT:  Head:  Normocephalic.  Right Ear: External ear normal.  Left Ear: External ear normal.  Nose: Nose normal.  Mouth/Throat: Oropharynx is clear and moist.  Eyes: Conjunctivae and EOM are normal. Pupils are equal, round, and reactive to light. Right eye exhibits no discharge. Left eye exhibits no discharge. No scleral icterus.  Neck: Normal range of motion. Neck supple. No JVD present. No tracheal deviation present. No thyromegaly present.  Cardiovascular: Normal rate, regular rhythm, normal heart sounds and intact distal pulses.  Exam reveals no gallop and no friction rub.   No murmur heard. Pulmonary/Chest: Breath sounds normal. No respiratory distress. He has no wheezes. He has no rales.  Abdominal: Soft. Bowel sounds are normal. He exhibits no mass. There is no hepatosplenomegaly. There is no tenderness. There is no rebound, no guarding and no CVA tenderness.  Musculoskeletal: Normal range of motion. He exhibits no edema or tenderness.  Lymphadenopathy:    He has no cervical adenopathy.  Neurological: He is alert and oriented to person, place, and time. He has normal sensation, normal strength, normal reflexes and intact cranial nerves. No cranial nerve deficit.  Skin: Skin is warm. No rash noted.  Psychiatric: Mood and affect normal.  Nursing note and vitals reviewed.     Assessment & Plan  Problem List Items Addressed This Visit      Digestive   Esophageal reflux - Primary   Relevant Medications   esomeprazole (NEXIUM) 40 MG capsule     Musculoskeletal and Integument   DDD (degenerative disc disease), lumbosacral   Relevant Medications   acetaminophen (MAPAP) 500 MG tablet    Other Visit Diagnoses    Essential hypertension        Relevant Medications    labetalol (NORMODYNE) 200 MG tablet    simvastatin (ZOCOR) 20 MG tablet    Hyperlipidemia        Relevant Medications    labetalol (NORMODYNE) 200 MG tablet    simvastatin (ZOCOR) 20 MG tablet    Hypothyroidism, unspecified  hypothyroidism type        Relevant Medications    labetalol (NORMODYNE) 200 MG tablet    levothyroxine (SYNTHROID, LEVOTHROID) 50 MCG tablet    Other Relevant Orders    TSH    Anemia associated with nutritional deficiency, unspecified nutritional anemia type        Relevant Orders    CBC    Ferritin    CKD (chronic kidney disease) stage 3, GFR 30-59 ml/min        Relevant Orders    Creatinine         Dr. Altariq Goodall [facility]  05/12/2016

## 2016-05-13 LAB — CBC
Hematocrit: 33.1 % — ABNORMAL LOW (ref 37.5–51.0)
Hemoglobin: 11.6 g/dL — ABNORMAL LOW (ref 12.6–17.7)
MCH: 32.7 pg (ref 26.6–33.0)
MCHC: 35 g/dL (ref 31.5–35.7)
MCV: 93 fL (ref 79–97)
PLATELETS: 69 10*3/uL — AB (ref 150–379)
RBC: 3.55 x10E6/uL — AB (ref 4.14–5.80)
RDW: 14.9 % (ref 12.3–15.4)
WBC: 3.8 10*3/uL (ref 3.4–10.8)

## 2016-05-13 LAB — CREATININE, SERUM
CREATININE: 2.38 mg/dL — AB (ref 0.76–1.27)
GFR, EST AFRICAN AMERICAN: 33 mL/min/{1.73_m2} — AB (ref >59)
GFR, EST NON AFRICAN AMERICAN: 28 mL/min/{1.73_m2} — AB (ref >59)

## 2016-05-13 LAB — FERRITIN: FERRITIN: 82 ng/mL (ref 30–400)

## 2016-05-13 LAB — TSH: TSH: 1.27 u[IU]/mL (ref 0.450–4.500)

## 2016-05-13 NOTE — Addendum Note (Signed)
Addended by: Fredderick Severance on: 05/13/2016 03:39 PM   Modules accepted: Orders

## 2016-06-11 ENCOUNTER — Other Ambulatory Visit: Payer: Self-pay

## 2016-06-16 ENCOUNTER — Inpatient Hospital Stay: Payer: Medicare Other | Attending: Hematology and Oncology | Admitting: Hematology and Oncology

## 2016-06-16 ENCOUNTER — Inpatient Hospital Stay: Payer: Medicare Other

## 2016-06-16 ENCOUNTER — Other Ambulatory Visit: Payer: Self-pay | Admitting: *Deleted

## 2016-06-16 ENCOUNTER — Encounter: Payer: Self-pay | Admitting: Hematology and Oncology

## 2016-06-16 VITALS — BP 130/78 | HR 56 | Temp 97.8°F | Resp 18 | Ht 75.0 in | Wt 196.9 lb

## 2016-06-16 DIAGNOSIS — I129 Hypertensive chronic kidney disease with stage 1 through stage 4 chronic kidney disease, or unspecified chronic kidney disease: Secondary | ICD-10-CM | POA: Diagnosis not present

## 2016-06-16 DIAGNOSIS — D649 Anemia, unspecified: Secondary | ICD-10-CM | POA: Diagnosis not present

## 2016-06-16 DIAGNOSIS — N189 Chronic kidney disease, unspecified: Secondary | ICD-10-CM

## 2016-06-16 DIAGNOSIS — Z803 Family history of malignant neoplasm of breast: Secondary | ICD-10-CM | POA: Insufficient documentation

## 2016-06-16 DIAGNOSIS — K219 Gastro-esophageal reflux disease without esophagitis: Secondary | ICD-10-CM

## 2016-06-16 DIAGNOSIS — F17210 Nicotine dependence, cigarettes, uncomplicated: Secondary | ICD-10-CM | POA: Diagnosis not present

## 2016-06-16 DIAGNOSIS — R634 Abnormal weight loss: Secondary | ICD-10-CM

## 2016-06-16 DIAGNOSIS — F319 Bipolar disorder, unspecified: Secondary | ICD-10-CM | POA: Diagnosis not present

## 2016-06-16 DIAGNOSIS — Z79899 Other long term (current) drug therapy: Secondary | ICD-10-CM

## 2016-06-16 DIAGNOSIS — E785 Hyperlipidemia, unspecified: Secondary | ICD-10-CM | POA: Diagnosis not present

## 2016-06-16 DIAGNOSIS — F200 Paranoid schizophrenia: Secondary | ICD-10-CM

## 2016-06-16 DIAGNOSIS — D72821 Monocytosis (symptomatic): Secondary | ICD-10-CM | POA: Diagnosis not present

## 2016-06-16 DIAGNOSIS — D696 Thrombocytopenia, unspecified: Secondary | ICD-10-CM

## 2016-06-16 DIAGNOSIS — D631 Anemia in chronic kidney disease: Secondary | ICD-10-CM | POA: Diagnosis present

## 2016-06-16 DIAGNOSIS — E079 Disorder of thyroid, unspecified: Secondary | ICD-10-CM | POA: Diagnosis not present

## 2016-06-16 LAB — CBC WITH DIFFERENTIAL/PLATELET
Basophils Absolute: 0 10*3/uL (ref 0–0.1)
Basophils Relative: 1 %
Eosinophils Absolute: 0.2 10*3/uL (ref 0–0.7)
Eosinophils Relative: 3 %
HCT: 34.6 % — ABNORMAL LOW (ref 40.0–52.0)
Hemoglobin: 11.9 g/dL — ABNORMAL LOW (ref 13.0–18.0)
Lymphocytes Relative: 37 %
Lymphs Abs: 1.7 10*3/uL (ref 1.0–3.6)
MCH: 32.4 pg (ref 26.0–34.0)
MCHC: 34.4 g/dL (ref 32.0–36.0)
MCV: 94.3 fL (ref 80.0–100.0)
Monocytes Absolute: 0.7 10*3/uL (ref 0.2–1.0)
Monocytes Relative: 14 %
Neutro Abs: 2.1 10*3/uL (ref 1.4–6.5)
Neutrophils Relative %: 45 %
Platelets: 72 10*3/uL — ABNORMAL LOW (ref 150–440)
RBC: 3.67 MIL/uL — ABNORMAL LOW (ref 4.40–5.90)
RDW: 14.2 % (ref 11.5–14.5)
WBC: 4.6 10*3/uL (ref 3.8–10.6)

## 2016-06-16 LAB — IRON AND TIBC
Iron: 55 ug/dL (ref 45–182)
Saturation Ratios: 14 % — ABNORMAL LOW (ref 17.9–39.5)
TIBC: 383 ug/dL (ref 250–450)
UIBC: 328 ug/dL

## 2016-06-16 LAB — PROTIME-INR
INR: 0.94
Prothrombin Time: 12.8 seconds (ref 11.4–15.0)

## 2016-06-16 LAB — APTT: aPTT: 33 seconds (ref 24–36)

## 2016-06-16 LAB — SEDIMENTATION RATE: Sed Rate: 10 mm/hr (ref 0–20)

## 2016-06-16 LAB — FOLATE: Folate: 23 ng/mL (ref >5.9)

## 2016-06-16 LAB — RETICULOCYTES
RBC.: 3.65 MIL/uL — ABNORMAL LOW (ref 4.40–5.90)
Retic Count, Absolute: 54.8 10*3/uL (ref 19.0–183.0)
Retic Ct Pct: 1.5 % (ref 0.4–3.1)

## 2016-06-16 NOTE — Progress Notes (Signed)
[facility] day:  06/16/2016  Chief Complaint: [facility]. is a 60 y.o. male with anemia and thrombocytopenia who is referred in consultation by Dr. [name] for assessment and management.  HPI:   He notes anemia on and off for his whole life.  At one point, he took iron pills.  He states that he was told that they "coudn't use my blood" (donation) secondary to the chemicals in it.  He describes taking LSD, speed, and smoking marijuana in his younger years.  He also describes taking Lithium for 30 years.  During that time, he had his blood checked every 6 months.  Lithium was stopped in the 1990s secondary to "kidney damage".  He notes that his diet is not good at the group home.  He eats "real good".  He complains about eating canned vegetables.  He eats hamburger and chicken.  He denies any melena or hematochezia.  He notes reflux.  He states that he had a colonoscopy at age 89.  Review of available labs reveals the following: CBC on 05/25/2010 revealed a hematocrit 34.4, hemoglobin 12.1, platelets 136,000, white count 7800 with 62% segs,  627% lymphs, 10% monocytes , 1% eosinophils, and 1% basophils. ANC was 4800.  CBC on 04/08/2012, hematocrit was 33.1, hemoglobin 11.1, platelets 80,000, white count 5800 with 53% segs, 33% lymphs, 11% monocytes, 1% eosinophils, and 1% basophils. ANC was 3000.   CBC on 06/12/2014 revealed a hematocrit of 35.4, hemoglobin 11.7, platelets 102,000, and white count 4300.  CBC on 07/14/2015 revealed a hematocrit 36.4, hemoglobin 12.2, platelets 93,000, and white count 5800.  CBC on 05/12/2016 revealed a hematocrit 33.1, hemoglobin 11.6, platelets 69,000, and white count 3800.  Ferritin was 82.  Creatinine has ranged between 2.04 and 2.38 from 05/25/2010 until the present time. Creatinine clearance is 28-9m/minute.  TSH was 1.27 on 05/12/2016.  Symptomatically, he has lost 30 pounds in the past 15 months (weight 230  pounds on 07/14/2015 and 196 pounds now).  He denies any fevers or sweats.  He denies any new medications of herbal products.  He denies any bruising or bleeding.   Past Medical History  Diagnosis Date  . Paranoid schizophrenia ([place]   . DDD (degenerative disc disease)   . 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 Bipolar disorder ([place]   . GERD (gastroesophageal reflux disease)   . Thyroid disease   . Hyperlipidemia   . Anemia   . Hypertension   . Cataract     right eye  . Heart murmur     Past Surgical History  Procedure Laterality Date  . Dermal abrasion  1973    Family History  Problem Relation Age of Onset  . Brain cancer Maternal Uncle   . Breast cancer Mother   . Melanoma Mother   . Congestive Heart Failure Father   . Melanoma Father   . Breast cancer Maternal Aunt   . Diabetes Maternal Aunt     Social History:  reports that he has been smoking.  He does not have any smokeless tobacco history on file. He reports that he does not drink alcohol or use illicit drugs.  He states that his mother died the week before Easter (February 07, 2016.  The patient states that he took drugs when he was younger (age [age].  He took LSD, speed, and MKarle Starch(marijuana).  He never did IV drugs.  He previously drank alcohol.  He stopped drinking wine 30 years  ago.The patient is accompanied by [facility] from the [facility] today.  Allergies:  Allergies  Allergen Reactions  . Tetracyclines & Related Hives    Current Medications: Current Outpatient Prescriptions  Medication Sig Dispense Refill  . acetaminophen (MAPAP) 500 MG tablet Take 500 mg by mouth every 6 (six) hours as needed.    Marland Kitchen asenapine (SAPHRIS) 5 MG SUBL 24 hr tablet Place 2 tablets (10 mg total) under the tongue at bedtime. (Patient taking differently: Place 15 mg under the tongue at bedtime. ) 60 tablet 0  . bimatoprost (LUMIGAN) 0.01 % SOLN Place 1 drop into both eyes at bedtime.    . brimonidine-timolol (COMBIGAN) 0.2-0.5 %  ophthalmic solution Place 1 drop into both eyes every 12 (twelve) hours.    . clonazePAM (KLONOPIN) 1 MG tablet Take 1 tablet (1 mg total) by mouth at bedtime. 30 tablet 0  . divalproex (DEPAKOTE ER) 500 MG 24 hr tablet Take 2 tablets (1,000 mg total) by mouth at bedtime. (Patient taking differently: Take 500 mg by mouth at bedtime. Take one tablet by mouth in am and two tabs by mouth in pm) 60 tablet 0  . esomeprazole (NEXIUM) 40 MG capsule Take 1 capsule (40 mg total) by mouth daily. 30 capsule 5  . FLUoxetine (PROZAC) 20 MG tablet Take 2 tablets (40 mg total) by mouth daily. 60 tablet 0  . labetalol (NORMODYNE) 200 MG tablet Take 0.5 tablets (100 mg total) by mouth 2 (two) times daily. 30 tablet 5  . levothyroxine (SYNTHROID, LEVOTHROID) 50 MCG tablet Take 1 tablet (50 mcg total) by mouth daily. 30 tablet 5  . Multiple Vitamin (MULTIVITAMIN WITH MINERALS) TABS tablet Take 1 tablet by mouth daily.    . polyethylene glycol powder (GLYCOLAX/MIRALAX) powder Take 17 g by mouth daily. 255 g 0  . pyridOXINE (VITAMIN B-6) 50 MG tablet Take 100 mg by mouth 2 (two) times daily.    . simvastatin (ZOCOR) 20 MG tablet Take 1 tablet (20 mg total) by mouth daily. 30 tablet 5  . traZODone (DESYREL) 100 MG tablet Take 0.5 tablets (50 mg total) by mouth at bedtime. (Patient taking differently: Take 100 mg by mouth at bedtime. )     No current facility-administered medications for this visit.    Review of Systems:  GENERAL:  Feels good.  No fevers or sweats.  Weight loss of 30 pounds in the past 15-16 months. PERFORMANCE STATUS (ECOG):  1 HEENT:  Recent change in prescription for vision changes.  Glaucoma.  No runny nose, sore throat, mouth sores or tenderness. Lungs: No shortness of breath or cough.  No hemoptysis. Cardiac:  No chest pain, palpitations, orthopnea, or PND. GI:  Reflux.  No nausea, vomiting, diarrhea, constipation, melena or hematochezia.  Colonoscopy at age 72. GU:  No urgency, frequency,  dysuria, or hematuria. Musculoskeletal:  No back pain.  No joint pain.  No muscle tenderness. Extremities:  No pain or swelling. Skin:  Bruises easily.  No rashes or skin changes. Neuro:  Short term memory poor.  No headache, numbness or weakness, balance or coordination issues. Endocrine:  No diabetes, thyroid issues, hot flashes or night sweats. Psych:  Schizophrenia.  Bipolar.   Pain:  No focal pain. Review of systems:  All other systems reviewed and found to be negative.  Physical Exam: Blood pressure 130/78, pulse 56, temperature 97.8 F (36.6 C), temperature source Tympanic, resp. rate 18, height 6' 3"  (1.905 m), weight 196 lb 13.9 oz (89.3 kg). GENERAL:  Well developed, well nourished, sitting comfortably in the exam room in no acute distress.  talkaltive. MENTAL STATUS:  Alert and oriented to person, place and time. HEAD:  Pearline Cables hair.  Male pattern baldness.  Normocephalic, atraumatic, face symmetric, no Cushingoid features. EYES:  Blue eyes.  Pupils equal round and reactive to light and accomodation.  No conjunctivitis or scleral icterus. ENT:  Oropharynx clear without lesion.  Tongue normal. Mucous membranes moist.  RESPIRATORY:  Clear to auscultation without rales, wheezes or rhonchi. CARDIOVASCULAR:  Regular rate and rhythm without murmur, rub or gallop. ABDOMEN:  Soft, non-tender, with active bowel sounds, and no appreciable hepatosplenomegaly.  No masses. SKIN:  No rashes, ulcers or lesions. EXTREMITIES: Chronic lower extremity edema.  No  skin discoloration or tenderness.  No palpable cords. LYMPH NODES: No palpable cervical, supraclavicular, axillary or inguinal adenopathy  NEUROLOGICAL: Unremarkable. PSYCH:  Appropriate.  No visits with results within 3 Day(s) from this visit. Latest known visit with results is:  Office Visit on 05/12/2016  Component Date Value Ref Range Status  . Creatinine, Ser 05/12/2016 2.38* 0.76 - 1.27 mg/dL Final  . GFR calc non Af Amer  05/12/2016 28* >59 mL/min/1.73 Final  . GFR calc Af Amer 05/12/2016 33* >59 mL/min/1.73 Final  . WBC 05/12/2016 3.8  3.4 - 10.8 x10E3/uL Final  . RBC 05/12/2016 3.55* 4.14 - 5.80 x10E6/uL Final  . Hemoglobin 05/12/2016 11.6* 12.6 - 17.7 g/dL Final  . Hematocrit 05/12/2016 33.1* 37.5 - 51.0 % Final  . MCV 05/12/2016 93  79 - 97 fL Final  . MCH 05/12/2016 32.7  26.6 - 33.0 pg Final  . MCHC 05/12/2016 35.0  31.5 - 35.7 g/dL Final  . RDW 05/12/2016 14.9  12.3 - 15.4 % Final  . Platelets 05/12/2016 69* 150 - 379 x10E3/uL Final  . Hematology Comments: 05/12/2016 Note:   Final   Verified by microscopic examination.  . Ferritin 05/12/2016 82  30 - 400 ng/mL Final  . TSH 05/12/2016 1.270  0.450 - 4.500 uIU/mL Final    Assessment:  Brandon Maynard. is a 60 y.o. male with schizophrenia, bipolar disorder, and psychotic disorder with at least a 6 year history of anemia and progressive thrombocytopenia.  He has mild leukopenia with a mild monocytosis (10-11%).  He has a distant history of iron deficiency anemia.  He had a colonoscopy 17 year ago (age 62).  Diet appears good (although he notes no fresh vegetables).  He denies any melena or hematochezia.  Ferritin was 82 (normal) on 05/12/2016.  He has chronic kidney disease.  He notes kidney disease associated with a 30 year history of Lithium use (stoped in the 1990s).  Creatinine has been stable between 2.04-2.38 (CrCl 28-31 ml/min).   He is on Depakote which has a 1-27% incidence of thrombocytopenia (dose related).  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  Prozac and Trazodone have case reports of associated anemia.  He notes a 30 pound weight loss in the past 15 months.  Staff at [facility] states that he eats well.  He has no nausea, vomiting or diarrhea.  TSH was normal on 05/12/2016.  Plan: 1.  Review diagnosis of anemia and thrombocytopenia.  Discuss differential diagnosis (production, destruction, sequestration issues).   Discuss medications.  Discuss anemia of chronic kidney disease.  Discuss initial work-up. 2.  Labs today:  CBC with diff, platelet count in a blue top tube, hepatitis B surface antigen, hepatitis B core antibody total, hepatitis C antibody, HIV  testing (patient consented), PT, PTT, iron studies, retic, B12, folate, retic, ANA with reflex, ESR.  3.  Peripheral smear for path review. 4.  Discuss additional work-up if initial testing unrevealing.  Concern for ongoing weight loss. Patient may require a bone marrow. 5.  Anticipate outpatient colonoscopy +/- EGD as patient has not had a colonoscopy in 17 years. 6.  RTC in 1 week for review of labs and discussion regarding direction of therapy.   Lequita Asal, MD  06/16/2016, 3:27 PM

## 2016-06-16 NOTE — Progress Notes (Signed)
Patient is here for new patient evaluation, no complaints he is here today with his caregiver [name].

## 2016-06-17 LAB — HIV ANTIBODY (ROUTINE TESTING W REFLEX): HIV Screen 4th Generation wRfx: NONREACTIVE

## 2016-06-17 LAB — HEPATITIS B CORE ANTIBODY, TOTAL: Hep B Core Total Ab: NEGATIVE

## 2016-06-17 LAB — VITAMIN B12: Vitamin B-12: 326 pg/mL (ref 180–914)

## 2016-06-17 LAB — HEPATITIS B SURFACE ANTIGEN: Hepatitis B Surface Ag: NEGATIVE

## 2016-06-17 LAB — HEPATITIS C ANTIBODY: HCV Ab: <0.1 s/co ratio (ref 0.0–0.9)

## 2016-06-17 LAB — ANA W/REFLEX: Anti Nuclear Antibody(ANA): NEGATIVE

## 2016-06-20 ENCOUNTER — Encounter: Payer: Self-pay | Admitting: Hematology and Oncology

## 2016-06-23 ENCOUNTER — Other Ambulatory Visit: Payer: Self-pay

## 2016-06-23 ENCOUNTER — Inpatient Hospital Stay (HOSPITAL_BASED_OUTPATIENT_CLINIC_OR_DEPARTMENT_OTHER): Payer: Medicare Other | Admitting: Hematology and Oncology

## 2016-06-23 VITALS — BP 97/60 | HR 51 | Temp 97.2°F | Resp 18 | Wt 194.2 lb

## 2016-06-23 DIAGNOSIS — F17210 Nicotine dependence, cigarettes, uncomplicated: Secondary | ICD-10-CM

## 2016-06-23 DIAGNOSIS — D696 Thrombocytopenia, unspecified: Secondary | ICD-10-CM

## 2016-06-23 DIAGNOSIS — E079 Disorder of thyroid, unspecified: Secondary | ICD-10-CM

## 2016-06-23 DIAGNOSIS — Z79899 Other long term (current) drug therapy: Secondary | ICD-10-CM

## 2016-06-23 DIAGNOSIS — E785 Hyperlipidemia, unspecified: Secondary | ICD-10-CM

## 2016-06-23 DIAGNOSIS — K219 Gastro-esophageal reflux disease without esophagitis: Secondary | ICD-10-CM

## 2016-06-23 DIAGNOSIS — D631 Anemia in chronic kidney disease: Secondary | ICD-10-CM | POA: Diagnosis not present

## 2016-06-23 DIAGNOSIS — F319 Bipolar disorder, unspecified: Secondary | ICD-10-CM

## 2016-06-23 DIAGNOSIS — D72821 Monocytosis (symptomatic): Secondary | ICD-10-CM

## 2016-06-23 DIAGNOSIS — R634 Abnormal weight loss: Secondary | ICD-10-CM

## 2016-06-23 DIAGNOSIS — I129 Hypertensive chronic kidney disease with stage 1 through stage 4 chronic kidney disease, or unspecified chronic kidney disease: Secondary | ICD-10-CM | POA: Diagnosis not present

## 2016-06-23 DIAGNOSIS — F200 Paranoid schizophrenia: Secondary | ICD-10-CM

## 2016-06-23 DIAGNOSIS — D649 Anemia, unspecified: Secondary | ICD-10-CM

## 2016-06-23 DIAGNOSIS — N189 Chronic kidney disease, unspecified: Secondary | ICD-10-CM | POA: Diagnosis not present

## 2016-06-23 NOTE — Progress Notes (Signed)
[place] [facility] day:  06/23/16  Chief Complaint: Brandon Maynard. is a 60 y.o. male with anemia and thrombocytopenia who is seen for review of work-up and discussion regarding direction of therapy.  HPI:   The patient was last seen in the hematology clinic on 06/16/2016.  At that time, he was seen for initial consultation.  He noted at least a 6 year history of anemia and progressive thrombocytopenia.  He has mild leukopenia with a mild monocytosis (10-11%).  He had a distant history of iron deficiency anemia.  He had not had a colonoscopy in 17 years.  Diet appears good (although he notes no fresh vegetables).  He denies any melena or hematochezia.    At last visit, it was noted that he had chronic kidney disease likely contributing to anemia.  Creatinine had been stable between 2.04-2.38 (CrCl 28-31 ml/min). He was on Depakote which has a 1-27% incidence of thrombocytopenia (dose related).  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  Prozac and Trazodone have case reports of associated anemia.  Concern was raised for his 30 pound weight loss in the past 15 months.  He denied any nausea, vomiting or diarrhea.  TSH was normal on 05/12/2016.  Work-up on 06/16/2016 noted a hematocrit 34.6, hemoglobin 11.9, MCV 94.3, platelets 72,000, white count 4600 with an [place] of 2099/02/17. The following were normal:  ANA, hepatitis B surface antigen , hepatitis B core antibody total , hepatitis C antibody, HIV antibody, PTT, PT, iron studies (iron saturation 14% and TIBC of 383), ESR, folate, B12 (326; low normal). Reticulocyte count was 1.5%.  Symptomatically, he denies any new complaints.  He has lost 2 more pounds.  He states that his weight loss is due to poor food options.     Past Medical History:  Diagnosis Date  . Anemia   . Bipolar disorder ([place])   . Cataract    right eye  . DDD (degenerative disc disease)   . GERD (gastroesophageal reflux disease)    . Heart murmur   . Hyperlipidemia   . Hypertension   . Paranoid schizophrenia ([employer])   . 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 Thyroid disease     Past Surgical History:  Procedure Laterality Date  . Dermal Abrasion  1973    Family History  Problem Relation Age of Onset  . Brain cancer Maternal Uncle   . Breast cancer Mother   . Melanoma Mother   . Congestive Heart Failure Father   . Melanoma Father   . Breast cancer Maternal Aunt   . Diabetes Maternal Aunt     Social History:  reports that he has been smoking.  He does not have any smokeless tobacco history on file. He reports that he does not drink alcohol or use drugs.  He states that his mother died the week before Easter February 18, 2016).  The patient states that he took drugs when he was younger (age [age]).  He took LSD, speed, and Karle Starch (marijuana).  He never did IV drugs.  He previously drank alcohol.  He stopped drinking wine 30 years ago.  He lives in the [place].  He is alone today.  Allergies:  Allergies  Allergen Reactions  . Tetracyclines & Related Hives    Current Medications: Current Outpatient Prescriptions  Medication Sig Dispense Refill  . acetaminophen (MAPAP) 500 MG tablet Take 500 mg by mouth every 6 (six) hours as needed.    Marland Kitchen asenapine (  SAPHRIS) 5 MG SUBL 24 hr tablet Place 2 tablets (10 mg total) under the tongue at bedtime. (Patient taking differently: Place 15 mg under the tongue at bedtime. ) 60 tablet 0  . bimatoprost (LUMIGAN) 0.01 % SOLN Place 1 drop into both eyes at bedtime.    . brimonidine-timolol (COMBIGAN) 0.2-0.5 % ophthalmic solution Place 1 drop into both eyes every 12 (twelve) hours.    . clonazePAM (KLONOPIN) 1 MG tablet Take 1 tablet (1 mg total) by mouth at bedtime. 30 tablet 0  . divalproex (DEPAKOTE ER) 500 MG 24 hr tablet Take 2 tablets (1,000 mg total) by mouth at bedtime. (Patient taking differently: Take 500 mg by mouth at bedtime. Take one tablet by mouth in am and two tabs  by mouth in pm) 60 tablet 0  . esomeprazole (NEXIUM) 40 MG capsule Take 1 capsule (40 mg total) by mouth daily. 30 capsule 5  . FLUoxetine (PROZAC) 20 MG tablet Take 2 tablets (40 mg total) by mouth daily. 60 tablet 0  . labetalol (NORMODYNE) 200 MG tablet Take 0.5 tablets (100 mg total) by mouth 2 (two) times daily. 30 tablet 5  . levothyroxine (SYNTHROID, LEVOTHROID) 50 MCG tablet Take 1 tablet (50 mcg total) by mouth daily. 30 tablet 5  . Multiple Vitamin (MULTIVITAMIN WITH MINERALS) TABS tablet Take 1 tablet by mouth daily.    Marland Kitchen pyridOXINE (VITAMIN B-6) 50 MG tablet Take 100 mg by mouth 2 (two) times daily.    . simvastatin (ZOCOR) 20 MG tablet Take 1 tablet (20 mg total) by mouth daily. 30 tablet 5  . traZODone (DESYREL) 100 MG tablet Take 0.5 tablets (50 mg total) by mouth at bedtime. (Patient taking differently: Take 100 mg by mouth at bedtime. )    . polyethylene glycol powder (GLYCOLAX/MIRALAX) powder Take 17 g by mouth daily. 255 g 0   No current facility-administered medications for this visit.     Review of Systems:  GENERAL:  Feels good.  No fevers or sweats.  Weight loss of 30 pounds in the past 15-16 months (additional 2 pounds in past week). PERFORMANCE STATUS (ECOG):  1 HEENT:  Glaucoma.  No runny nose, sore throat, mouth sores or tenderness. Lungs: No shortness of breath or cough.  No hemoptysis. Cardiac:  No chest pain, palpitations, orthopnea, or PND. GI:  Reflux.  No nausea, vomiting, diarrhea, constipation, melena or hematochezia.  Colonoscopy at age 17. GU:  No urgency, frequency, dysuria, or hematuria. Musculoskeletal:  No back pain.  No joint pain.  No muscle tenderness. Extremities:  No pain or swelling. Skin:  Bruises easily.  No rashes or skin changes. Neuro:  Short term memory poor.  No headache, numbness or weakness, balance or coordination issues. Endocrine:  No diabetes, thyroid issues, hot flashes or night sweats. Psych:  Schizophrenia.  Bipolar.   Pain:   No focal pain. Review of systems:  All other systems reviewed and found to be negative.  Physical Exam: Blood pressure 97/60, pulse (!) 51, temperature 97.2 F (36.2 C), temperature source Tympanic, resp. rate 18, weight 194 lb 3.6 oz (88.1 kg). GENERAL:  Well developed, well nourished, sitting comfortably in the exam room in no acute distress. Talkaltive. MENTAL STATUS:  Alert and oriented to person, place and time. HEAD:  Pearline Cables hair.  Male pattern baldness.  Normocephalic, atraumatic, face symmetric, no Cushingoid features. EYES:  Blue eyes.  No conjunctivitis or scleral icterus. NEUROLOGICAL: Unremarkable. PSYCH:  Appropriate.   No visits with results within 3  Day(s) from this visit.  Latest known visit with results is:  Office Visit on 06/16/2016  Component Date Value Ref Range Status  . WBC 06/16/2016 4.6  3.8 - 10.6 K/uL Final  . RBC 06/16/2016 3.67* 4.40 - 5.90 MIL/uL Final  . Hemoglobin 06/16/2016 11.9* 13.0 - 18.0 g/dL Final  . HCT 06/16/2016 34.6* 40.0 - 52.0 % Final  . MCV 06/16/2016 94.3  80.0 - 100.0 fL Final  . MCH 06/16/2016 32.4  26.0 - 34.0 pg Final  . MCHC 06/16/2016 34.4  32.0 - 36.0 g/dL Final  . RDW 06/16/2016 14.2  11.5 - 14.5 % Final  . Platelets 06/16/2016 72* 150 - 440 K/uL Final  . Neutrophils Relative % 06/16/2016 45%  % Final  . Neutro Abs 06/16/2016 2.1  1.4 - 6.5 K/uL Final  . Lymphocytes Relative 06/16/2016 37%  % Final  . Lymphs Abs 06/16/2016 1.7  1.0 - 3.6 K/uL Final  . Monocytes Relative 06/16/2016 14%  % Final  . Monocytes Absolute 06/16/2016 0.7  0.2 - 1.0 K/uL Final  . Eosinophils Relative 06/16/2016 3%  % Final  . Eosinophils Absolute 06/16/2016 0.2  0 - 0.7 K/uL Final  . Basophils Relative 06/16/2016 1%  % Final  . Basophils Absolute 06/16/2016 0.0  0 - 0.1 K/uL Final  . Hep B Core Total Ab 06/17/2016 Negative  Negative Final   Comment: (NOTE) Performed At: [facility] [place], [place] [id number] Lindon Romp  MD AS:[id number]   . HCV Ab 06/17/2016 <0.1  0.0 - 0.9 s/co ratio Final   Comment: (NOTE)                                  Negative:     < 0.8                             Indeterminate: 0.8 - 0.9                                  Positive:     > 0.9 The CDC recommends that a positive HCV antibody result be followed up with a HCV Nucleic Acid Amplification test (989211). Performed At: [facility] [place], [place] [id number] Lindon Romp MD GY:[id number]   . Hepatitis B Surface Ag 06/17/2016 Negative  Negative Final   Comment: (NOTE) Performed At: [facility] [place], [place] [id number] Lindon Romp MD [id number]   . HIV Screen 4th Generation wRfx 06/17/2016 Non Reactive  Non Reactive Final   Comment: (NOTE) Performed At: [facility] [place], [place] [id number] Lindon Romp MD GG:[id number]   . aPTT 06/16/2016 33  24 - 36 seconds Final  . Prothrombin Time 06/16/2016 12.8  11.4 - 15.0 seconds Final  . INR 06/16/2016 0.94   Final  . Retic Ct Pct 06/16/2016 1.5  0.4 - 3.1 % Final  . RBC. 06/16/2016 3.65* 4.40 - 5.90 MIL/uL Final  . Retic Count, Manual 06/16/2016 54.8  19.0 - 183.0 K/uL Final  . Vitamin B-12 06/17/2016 326  180 - 914 pg/mL Final   Comment: (NOTE) This assay is not validated for testing neonatal or myeloproliferative syndrome specimens for Vitamin B12 levels. Performed at [facility]   . Folate 06/16/2016  23.0  >5.9 ng/mL Final  . Iron 06/16/2016 55  45 - 182 ug/dL Final  . TIBC 06/16/2016 383  250 - 450 ug/dL Final  . Saturation Ratios 06/16/2016 14* 17.9 - 39.5 % Final  . UIBC 06/16/2016 328  ug/dL Final  . Sed Rate 06/16/2016 10  0 - 20 mm/hr Final  . Anit Nuclear Antibody(ANA) 06/17/2016 Negative  Negative Final   Comment: (NOTE) Performed At: [facility] [address] [place], [place] [id number] Lindon Romp MD IA:[id number]     Assessment:   [name]. is a 60 y.o. male with schizophrenia, bipolar disorder, and psychotic disorder with at least a 6 year history of anemia and progressive thrombocytopenia.  He has mild leukopenia with a mild monocytosis (10-11%).   He likely has multi-factorial anemia.  He has a distant history of iron deficiency anemia.  He had a colonoscopy 17 year ago (age 42).  Diet appears good (although he notes no fresh vegetables).  He denies any melena or hematochezia.  Ferritin was 82 (normal) on 05/12/2016.    He has chronic kidney disease (CKD) associated with a 30 year history of Lithium use (stoped in the 1990s).  CKD is contributing to his anemia.  Creatinine has been stable between 2.04-2.38 (CrCl 28-31 ml/min).   He is on Depakote which has a 1-27% incidence of thrombocytopenia (dose related).  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  Prozac and Trazodone have case reports of associated anemia.  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  Work-up on 06/16/2016 noted a hematocrit 34.6, hemoglobin 11.9, MCV 94.3, platelets 72,000, white count 4600 with an [place] of 2100. The following were normal:  ANA, hepatitis B surface antigen , hepatitis B core antibody total , hepatitis C antibody, HIV antibody, PTT, PT, iron studies (iron saturation 14% and TIBC of 383), ESR, folate, B12 (326; low normal). Reticulocyte count was 1.5%.  He has had a 32 pound weight loss in the past 15 months.  Staff at [facility] states that he eats well.  He feels that his weight loss is due to available food options.  He has no nausea, vomiting or diarrhea.  TSH was normal on 05/12/2016.  Plan: 1.  Review work-up.  Discuss anemia likely multi-factorial.  One component is renal insufficiency.  Discuss need for colonoscopy (late).  Discuss food intake.  Possible bone marrow issue or medication related (Prozac and Trazadone).  2.  Discuss thrombocytopenia.  Discuss possible bone marrow process  or chronic ITP.  Possibly medication related (Depakaote).  Discuss additional testing.  Discuss concern for unexplained weight loss.  Patient declines additional labs.  3.  Labs: CMP, SPEP, free light chains, MMA. [ patient declines] 4.  Peripheral smear for path review. 5.  Abdominal ultrasound:  assess liver and spleen size. 6.  Encourage colonoscopy. 7.  Discuss possible bone marrow aspirate and biopsy if etiology remains unclear. 8.  RTC in 2 weeks for MD assessment, review of interval studies and discussion regarding direction of therapy.   Lequita Asal, MD  06/23/2016, 10:16 AM

## 2016-06-23 NOTE — Progress Notes (Signed)
Patient is here for follow up, no complaints  

## 2016-06-25 ENCOUNTER — Ambulatory Visit: Payer: Self-pay | Admitting: Hematology and Oncology

## 2016-06-27 ENCOUNTER — Encounter: Payer: Self-pay | Admitting: Hematology and Oncology

## 2016-06-28 ENCOUNTER — Ambulatory Visit
Admission: RE | Admit: 2016-06-28 | Discharge: 2016-06-28 | Disposition: A | Discharge status: Home / Self Care | Payer: Medicare Other | Source: Ambulatory Visit | Attending: Hematology and Oncology | Admitting: Hematology and Oncology

## 2016-06-28 DIAGNOSIS — R634 Abnormal weight loss: Secondary | ICD-10-CM | POA: Diagnosis not present

## 2016-06-28 DIAGNOSIS — N281 Cyst of kidney, acquired: Secondary | ICD-10-CM | POA: Diagnosis not present

## 2016-06-28 DIAGNOSIS — D696 Thrombocytopenia, unspecified: Secondary | ICD-10-CM | POA: Diagnosis not present

## 2016-07-05 NOTE — Progress Notes (Signed)
[facility] day:  07/06/16  Chief Complaint: Brandon Maynard. is a 60 y.o. male with anemia and thrombocytopenia who is seen for 2 week assessment.  HPI:   The patient was last seen in the hematology clinic on 06/23/2016.  At that time, work-up was reviewed.  Anemia appeared to be multi-factorial.  He has known renal insufficiency (Cr 2.38; CrCl 28 ml/min). He was encouraged to have a colonoscopy (late).  Food choices were discussed.  It was felt that his Prozac or Trazadone were unlikely contributing (< 1% incidence).    Regarding his thrombocytopenia, etiology was felt possibly related to Depakote (1-27%; dose related), chronic ITP, or a marrow process.  Additional testing was discussed.  Concern was raised for his unexplained weight loss.  He felt that he could eat better.  He declined additional labs.  He agreed to imaging.  Abdominal ultrasound on 06/28/2016 revealed a spleen upper limits of normal (13.2 cm).  Symptomatically, he feels "pretty good".  He is taking B12 and B6.  He states that he "doesn't ever want a colonoscopy".   Past Medical History:  Diagnosis Date  . Anemia   . Bipolar disorder ([place])   . Cataract    right eye  . DDD (degenerative disc disease)   . GERD (gastroesophageal reflux disease)   . Heart murmur   . Hyperlipidemia   . Hypertension   . Paranoid schizophrenia ([place])   . 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 Thyroid disease     Past Surgical History:  Procedure Laterality Date  . Dermal Abrasion  1973    Family History  Problem Relation Age of Onset  . Brain cancer Maternal Uncle   . Breast cancer Mother   . Melanoma Mother   . Congestive Heart Failure Father   . Melanoma Father   . Breast cancer Maternal Aunt   . Diabetes Maternal Aunt     Social History:  reports that he has been smoking.  He does not have any smokeless tobacco history on file. He reports that he does not drink alcohol or use drugs.   He states that his mother died the week before Easter February 15, 2016).  The patient states that he took drugs when he was younger (age [age]).  He took LSD, speed, and Karle Starch (marijuana).  He never did IV drugs.  He previously drank alcohol.  He stopped drinking wine 30 years ago.  He lives in the [name].  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  Contact number [phone].  He is accompanied by Brandon Maynard today.  Allergies:  Allergies  Allergen Reactions  . Tetracyclines & Related Hives    Current Medications: Current Outpatient Prescriptions  Medication Sig Dispense Refill  . asenapine (SAPHRIS) 5 MG SUBL 24 hr tablet Place 2 tablets (10 mg total) under the tongue at bedtime. (Patient taking differently: Place 15 mg under the tongue at bedtime. ) 60 tablet 0  . bimatoprost (LUMIGAN) 0.01 % SOLN Place 1 drop into both eyes at bedtime.    . brimonidine-timolol (COMBIGAN) 0.2-0.5 % ophthalmic solution Place 1 drop into both eyes every 12 (twelve) hours.    . clonazePAM (KLONOPIN) 1 MG tablet Take 1 tablet (1 mg total) by mouth at bedtime. 30 tablet 0  . divalproex (DEPAKOTE ER) 500 MG 24 hr tablet Take 2 tablets (1,000 mg total) by mouth at bedtime. (Patient taking differently: Take 500 mg by mouth at bedtime. Take one  tablet by mouth in am and two tabs by mouth in pm) 60 tablet 0  . esomeprazole (NEXIUM) 40 MG capsule Take 1 capsule (40 mg total) by mouth daily. 30 capsule 5  . FLUoxetine (PROZAC) 20 MG tablet Take 2 tablets (40 mg total) by mouth daily. 60 tablet 0  . labetalol (NORMODYNE) 200 MG tablet Take 0.5 tablets (100 mg total) by mouth 2 (two) times daily. 30 tablet 5  . levothyroxine (SYNTHROID, LEVOTHROID) 50 MCG tablet Take 1 tablet (50 mcg total) by mouth daily. 30 tablet 5  . Multiple Vitamin (MULTIVITAMIN WITH MINERALS) TABS tablet Take 1 tablet by mouth daily.    Marland Kitchen pyridOXINE (VITAMIN B-6) 50 MG tablet Take 100 mg by mouth 2 (two) times daily.    . simvastatin (ZOCOR) 20 MG  tablet Take 1 tablet (20 mg total) by mouth daily. 30 tablet 5  . traZODone (DESYREL) 100 MG tablet Take 0.5 tablets (50 mg total) by mouth at bedtime. (Patient taking differently: Take 100 mg by mouth at bedtime. )     No current facility-administered medications for this visit.     Review of Systems:  GENERAL:  Feels "pretty good".  No fevers or sweats.  Weight loss of 30 pounds in the past 15-16 months (4 pound weight gain since last visit). PERFORMANCE STATUS (ECOG):  1 HEENT:  Glaucoma.  No runny nose, sore throat, mouth sores or tenderness. Lungs: No shortness of breath or cough.  No hemoptysis. Cardiac:  No chest pain, palpitations, orthopnea, or PND. GI:  Reflux.  No nausea, vomiting, diarrhea, constipation, melena or hematochezia.  Colonoscopy at age 54 (doesn't want another). GU:  No urgency, frequency, dysuria, or hematuria. Musculoskeletal:  No back pain.  No joint pain.  No muscle tenderness. Extremities:  No pain or swelling. Skin:  Bruises easily.  No rashes or skin changes. Neuro:  Short term memory poor.  No headache, numbness or weakness, balance or coordination issues. Endocrine:  No diabetes, thyroid issues, hot flashes or night sweats. Psych:  Schizophrenia.  Bipolar.   Pain:  No focal pain. Review of systems:  All other systems reviewed and found to be negative.  Physical Exam: Blood pressure (!) 96/53, pulse (!) 55, temperature (!) 95.6 F (35.3 C), temperature source Tympanic, resp. rate 18, weight 198 lb 10.2 oz (90.1 kg). GENERAL:  Well developed, well nourished, sitting comfortably in the exam room in no acute distress. Talkaltive. MENTAL STATUS:  Alert and oriented to person, place and time. HEAD:  Pearline Cables hair.  Male pattern baldness.  Normocephalic, atraumatic, face symmetric, no Cushingoid features. EYES:  Glasses.  Blue eyes.  Pupils equal round and reactive to light and accomodation.  No conjunctivitis or scleral icterus. ENT:  Oropharynx clear without  lesion.  Tongue normal. Mucous membranes dry.  RESPIRATORY:  Clear to auscultation without rales, wheezes or rhonchi. CARDIOVASCULAR:  Regular rate and rhythm without murmur, rub or gallop. ABDOMEN:  Soft, non-tender, with active bowel sounds, and no appreciable hepatosplenomegaly.  No masses. SKIN:  No rashes, ulcers or lesions. EXTREMITIES: Chronic lower extremity edema.  No  skin discoloration or tenderness.  No palpable cords. LYMPH NODES: No palpable cervical, supraclavicular, axillary or inguinal adenopathy  NEUROLOGICAL: Unremarkable. PSYCH:  Appropriate.   Appointment on 07/06/2016  Component Date Value Ref Range Status  . WBC 07/06/2016 4.1  3.8 - 10.6 K/uL Final  . RBC 07/06/2016 3.34* 4.40 - 5.90 MIL/uL Final  . Hemoglobin 07/06/2016 10.9* 13.0 - 18.0 g/dL Final  .  HCT 07/06/2016 31.1* 40.0 - 52.0 % Final  . MCV 07/06/2016 92.9  80.0 - 100.0 fL Final  . MCH 07/06/2016 32.5  26.0 - 34.0 pg Final  . MCHC 07/06/2016 35.0  32.0 - 36.0 g/dL Final  . RDW 07/06/2016 14.3  11.5 - 14.5 % Final  . Platelets 07/06/2016 82* 150 - 440 K/uL Final  . Neutrophils Relative % 07/06/2016 PENDING  % Incomplete  . Neutro Abs 07/06/2016 PENDING  1.7 - 7.7 K/uL Incomplete  . Band Neutrophils 07/06/2016 PENDING  % Incomplete  . Lymphocytes Relative 07/06/2016 PENDING  % Incomplete  . Lymphs Abs 07/06/2016 PENDING  0.7 - 4.0 K/uL Incomplete  . Monocytes Relative 07/06/2016 PENDING  % Incomplete  . Monocytes Absolute 07/06/2016 PENDING  0.1 - 1.0 K/uL Incomplete  . Eosinophils Relative 07/06/2016 PENDING  % Incomplete  . Eosinophils Absolute 07/06/2016 PENDING  0.0 - 0.7 K/uL Incomplete  . Basophils Relative 07/06/2016 PENDING  % Incomplete  . Basophils Absolute 07/06/2016 PENDING  0.0 - 0.1 K/uL Incomplete  . WBC Morphology 07/06/2016 PENDING   Incomplete  . RBC Morphology 07/06/2016 PENDING   Incomplete  . Smear Review 07/06/2016 PENDING   Incomplete  . Other 07/06/2016 PENDING  % Incomplete   . nRBC 07/06/2016 PENDING  0 /100 WBC Incomplete  . Metamyelocytes Relative 07/06/2016 PENDING  % Incomplete  . Myelocytes 07/06/2016 PENDING  % Incomplete  . Promyelocytes Absolute 07/06/2016 PENDING  % Incomplete  . Blasts 07/06/2016 PENDING  % Incomplete    Assessment:  Brandon Maynard. is a 60 y.o. male with schizophrenia, bipolar disorder, and psychotic disorder with at least a 6 year history of anemia and progressive thrombocytopenia.  He has mild leukopenia with a mild monocytosis (10-11%).   He likely has multi-factorial anemia.  He has a distant history of iron deficiency anemia.  He had a colonoscopy 17 year ago (age 73).  Diet appears good (although he notes no fresh vegetables).  He denies any melena or hematochezia.  Ferritin was 82 (normal) on 05/12/2016.    He has chronic kidney disease (CKD) associated with a 30 year history of Lithium use (stoped in the 1990s).  CKD is contributing to his anemia.  Creatinine has been stable between 2.04-2.38 (CrCl 28-31 ml/min).   He is on Depakote which has a 1-27% incidence of thrombocytopenia (dose related).  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  Prozac and Trazodone have case reports of associated anemia.  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  Work-up on 06/16/2016 noted a hematocrit 34.6, hemoglobin 11.9, MCV 94.3, platelets 72,000, white count 4600 with an [place] of 2100. The following were normal:  ANA, hepatitis B surface antigen , hepatitis B core antibody total , hepatitis C antibody, HIV antibody, PTT, PT, iron studies (iron saturation 14% and TIBC of 383), ESR, folate, B12 (326; low normal). Reticulocyte count was 1.5%.  Abdominal ultrasound on 06/28/2016 revealed a spleen upper limits of normal (13.2 cm).  He has had a 32 pound weight loss in the past 15 months (weight gain of 4 pounds since last visit).  Staff at [facility] states that he eats well.  He feels that his weight loss is  due to available food options.  He has no nausea, vomiting or diarrhea.  TSH was normal on 05/12/2016.  Plan: 1.  Review labs from initial work-up and abdominal ultrasound. 2.  Labs today: CBC with diff, CMP, SPEP, free light chains,  MMA. 3.  Peripheral smear for path review. 4.  Encourage colonoscopy (patient "doesn't ever want"). 5.  Discuss possible bone marrow aspirate and biopsy if etiology remains unclear. 6.  Consider imaging studies if weight loss continues. 7.  Consider guaiac cards if patient remains adamant about no colonoscopy. 8.  RTC in 1 month for MD assessment and labs (CBC with diff, epo level).   Lequita Asal, MD  07/06/2016, 9:29 AM

## 2016-07-06 ENCOUNTER — Inpatient Hospital Stay: Payer: Medicare Other | Attending: Hematology and Oncology

## 2016-07-06 ENCOUNTER — Inpatient Hospital Stay (HOSPITAL_BASED_OUTPATIENT_CLINIC_OR_DEPARTMENT_OTHER): Payer: Medicare Other | Admitting: Hematology and Oncology

## 2016-07-06 ENCOUNTER — Other Ambulatory Visit: Payer: Self-pay | Admitting: *Deleted

## 2016-07-06 ENCOUNTER — Encounter: Payer: Self-pay | Admitting: Hematology and Oncology

## 2016-07-06 VITALS — BP 96/53 | HR 55 | Temp 95.6°F | Resp 18 | Wt 198.6 lb

## 2016-07-06 DIAGNOSIS — I129 Hypertensive chronic kidney disease with stage 1 through stage 4 chronic kidney disease, or unspecified chronic kidney disease: Secondary | ICD-10-CM | POA: Insufficient documentation

## 2016-07-06 DIAGNOSIS — D693 Immune thrombocytopenic purpura: Secondary | ICD-10-CM | POA: Diagnosis not present

## 2016-07-06 DIAGNOSIS — Z79899 Other long term (current) drug therapy: Secondary | ICD-10-CM | POA: Insufficient documentation

## 2016-07-06 DIAGNOSIS — D696 Thrombocytopenia, unspecified: Secondary | ICD-10-CM

## 2016-07-06 DIAGNOSIS — D631 Anemia in chronic kidney disease: Secondary | ICD-10-CM

## 2016-07-06 DIAGNOSIS — D649 Anemia, unspecified: Secondary | ICD-10-CM

## 2016-07-06 DIAGNOSIS — E538 Deficiency of other specified B group vitamins: Secondary | ICD-10-CM | POA: Insufficient documentation

## 2016-07-06 DIAGNOSIS — D61818 Other pancytopenia: Secondary | ICD-10-CM

## 2016-07-06 DIAGNOSIS — K219 Gastro-esophageal reflux disease without esophagitis: Secondary | ICD-10-CM | POA: Insufficient documentation

## 2016-07-06 DIAGNOSIS — Z803 Family history of malignant neoplasm of breast: Secondary | ICD-10-CM | POA: Insufficient documentation

## 2016-07-06 DIAGNOSIS — Z808 Family history of malignant neoplasm of other organs or systems: Secondary | ICD-10-CM | POA: Diagnosis not present

## 2016-07-06 DIAGNOSIS — E079 Disorder of thyroid, unspecified: Secondary | ICD-10-CM | POA: Diagnosis not present

## 2016-07-06 DIAGNOSIS — R634 Abnormal weight loss: Secondary | ICD-10-CM | POA: Diagnosis not present

## 2016-07-06 DIAGNOSIS — E785 Hyperlipidemia, unspecified: Secondary | ICD-10-CM

## 2016-07-06 DIAGNOSIS — F17210 Nicotine dependence, cigarettes, uncomplicated: Secondary | ICD-10-CM

## 2016-07-06 DIAGNOSIS — F319 Bipolar disorder, unspecified: Secondary | ICD-10-CM | POA: Diagnosis not present

## 2016-07-06 LAB — COMPREHENSIVE METABOLIC PANEL
ALT: 26 U/L (ref 17–63)
AST: 31 U/L (ref 15–41)
Albumin: 3.5 g/dL (ref 3.5–5.0)
Alkaline Phosphatase: 56 U/L (ref 38–126)
Anion gap: 4 — ABNORMAL LOW (ref 5–15)
BUN: 30 mg/dL — ABNORMAL HIGH (ref 6–20)
CO2: 24 mmol/L (ref 22–32)
Calcium: 9.1 mg/dL (ref 8.9–10.3)
Chloride: 109 mmol/L (ref 101–111)
Creatinine, Ser: 2.11 mg/dL — ABNORMAL HIGH (ref 0.61–1.24)
GFR calc Af Amer: 37 mL/min — ABNORMAL LOW (ref >60)
GFR calc non Af Amer: 32 mL/min — ABNORMAL LOW (ref >60)
Glucose, Bld: 93 mg/dL (ref 65–99)
Potassium: 4.4 mmol/L (ref 3.5–5.1)
Sodium: 137 mmol/L (ref 135–145)
Total Bilirubin: 0.6 mg/dL (ref 0.3–1.2)
Total Protein: 6.6 g/dL (ref 6.5–8.1)

## 2016-07-06 LAB — PATHOLOGIST SMEAR REVIEW

## 2016-07-06 LAB — CBC WITH DIFFERENTIAL/PLATELET
Band Neutrophils: 2 %
Basophils Absolute: 0 10*3/uL (ref 0–0.1)
Basophils Relative: 1 %
Eosinophils Absolute: 0.1 10*3/uL (ref 0–0.7)
Eosinophils Relative: 3 %
HCT: 31.1 % — ABNORMAL LOW (ref 40.0–52.0)
Hemoglobin: 10.9 g/dL — ABNORMAL LOW (ref 13.0–18.0)
Lymphocytes Relative: 30 %
Lymphs Abs: 1.2 10*3/uL (ref 1.0–3.6)
MCH: 32.5 pg (ref 26.0–34.0)
MCHC: 35 g/dL (ref 32.0–36.0)
MCV: 92.9 fL (ref 80.0–100.0)
Monocytes Absolute: 0.6 10*3/uL (ref 0.2–1.0)
Monocytes Relative: 15 %
Neutro Abs: 2.2 10*3/uL (ref 1.4–6.5)
Neutrophils Relative %: 49 %
Platelets: 82 10*3/uL — ABNORMAL LOW (ref 150–440)
RBC: 3.34 MIL/uL — ABNORMAL LOW (ref 4.40–5.90)
RDW: 14.3 % (ref 11.5–14.5)
WBC: 4.1 10*3/uL (ref 3.8–10.6)

## 2016-07-06 NOTE — Progress Notes (Signed)
States is feeling weak and tired but states recently started on vit b6 and multivitamin which is helping his energy levels improve. 4lb weight gain since last visit. Offers no other complaints today.

## 2016-07-07 LAB — KAPPA/LAMBDA LIGHT CHAINS
Kappa free light chain: 55.8 mg/L — ABNORMAL HIGH (ref 3.3–19.4)
Kappa, lambda light chain ratio: 1.46 (ref 0.26–1.65)
Lambda free light chains: 38.2 mg/L — ABNORMAL HIGH (ref 5.7–26.3)

## 2016-07-07 LAB — PROTEIN ELECTROPHORESIS, SERUM
A/G Ratio: 1.6 (ref 0.7–1.7)
Albumin ELP: 3.6 g/dL (ref 2.9–4.4)
Alpha-1-Globulin: 0.2 g/dL (ref 0.0–0.4)
Alpha-2-Globulin: 0.4 g/dL (ref 0.4–1.0)
Beta Globulin: 0.9 g/dL (ref 0.7–1.3)
Gamma Globulin: 0.9 g/dL (ref 0.4–1.8)
Globulin, Total: 2.3 g/dL (ref 2.2–3.9)
Total Protein ELP: 5.9 g/dL — ABNORMAL LOW (ref 6.0–8.5)

## 2016-07-08 LAB — METHYLMALONIC ACID, SERUM: Methylmalonic Acid, Quantitative: 445 nmol/L — ABNORMAL HIGH (ref 0–378)

## 2016-07-09 ENCOUNTER — Other Ambulatory Visit: Payer: Self-pay | Admitting: Hematology and Oncology

## 2016-07-09 ENCOUNTER — Telehealth: Payer: Self-pay | Admitting: *Deleted

## 2016-07-09 DIAGNOSIS — E538 Deficiency of other specified B group vitamins: Secondary | ICD-10-CM

## 2016-07-09 NOTE — Telephone Encounter (Signed)
Notified pt's caregiver that pt will need weekly b12 injections x 6 weeks then monthly for b12 deficiency. Caregiver verbalized understanding.

## 2016-07-12 ENCOUNTER — Encounter (INDEPENDENT_AMBULATORY_CARE_PROVIDER_SITE_OTHER): Payer: Self-pay

## 2016-07-12 ENCOUNTER — Inpatient Hospital Stay: Payer: Medicare Other

## 2016-07-12 DIAGNOSIS — E538 Deficiency of other specified B group vitamins: Secondary | ICD-10-CM

## 2016-07-12 DIAGNOSIS — I129 Hypertensive chronic kidney disease with stage 1 through stage 4 chronic kidney disease, or unspecified chronic kidney disease: Secondary | ICD-10-CM | POA: Diagnosis not present

## 2016-07-12 MED ORDER — CYANOCOBALAMIN 1000 MCG/ML IJ SOLN
1000.0000 ug | Freq: Once | INTRAMUSCULAR | Status: AC
  Administered 2016-07-12: 1000 ug via INTRAMUSCULAR
  Filled 2016-07-12: qty 1

## 2016-07-19 ENCOUNTER — Inpatient Hospital Stay: Payer: Medicare Other

## 2016-07-19 DIAGNOSIS — I129 Hypertensive chronic kidney disease with stage 1 through stage 4 chronic kidney disease, or unspecified chronic kidney disease: Secondary | ICD-10-CM | POA: Diagnosis not present

## 2016-07-19 DIAGNOSIS — E538 Deficiency of other specified B group vitamins: Secondary | ICD-10-CM

## 2016-07-19 MED ORDER — CYANOCOBALAMIN 1000 MCG/ML IJ SOLN
1000.0000 ug | Freq: Once | INTRAMUSCULAR | Status: AC
  Administered 2016-07-19: 1000 ug via INTRAMUSCULAR
  Filled 2016-07-19: qty 1

## 2016-07-26 ENCOUNTER — Inpatient Hospital Stay: Payer: Medicare Other

## 2016-07-28 ENCOUNTER — Ambulatory Visit: Payer: Medicare Other

## 2016-07-28 ENCOUNTER — Encounter (INDEPENDENT_AMBULATORY_CARE_PROVIDER_SITE_OTHER): Payer: Self-pay

## 2016-07-28 ENCOUNTER — Inpatient Hospital Stay: Payer: Medicare Other

## 2016-07-28 ENCOUNTER — Other Ambulatory Visit: Payer: Self-pay

## 2016-07-28 ENCOUNTER — Telehealth: Payer: Self-pay

## 2016-07-28 DIAGNOSIS — E538 Deficiency of other specified B group vitamins: Secondary | ICD-10-CM

## 2016-07-28 MED ORDER — CYANOCOBALAMIN 1000 MCG/ML IJ SOLN
1000.0000 ug | Freq: Once | INTRAMUSCULAR | Status: DC
  Filled 2016-07-28 (×2): qty 1

## 2016-07-28 NOTE — Telephone Encounter (Signed)
Patient is here for B12 injection today but when he was brought back to infusion to receive the injection he stated that he is going to decline.  He reports that when he got the last B12 injeciton it did not help his leg weakness and he just sat on the front arguing with traffic that wasn't there.  There was a caregiver from the group home with him today that told him if he declines today that would not bring him back for another one and the patient said that was fine because he is declining any further B12 injections because they made him feel talk crazy.    I told patient and caregiver that I would inform MD.  Would you like to cancel any further B12 injection appts?

## 2016-07-28 NOTE — Telephone Encounter (Signed)
  We can try him on oral B12.  I do not think the B12 is causing him problems.  M

## 2016-08-03 ENCOUNTER — Ambulatory Visit: Payer: Self-pay

## 2016-08-03 ENCOUNTER — Other Ambulatory Visit: Payer: Self-pay

## 2016-08-03 ENCOUNTER — Inpatient Hospital Stay: Payer: Medicare Other | Attending: Hematology and Oncology

## 2016-08-03 ENCOUNTER — Encounter: Payer: Self-pay | Admitting: Hematology and Oncology

## 2016-08-03 ENCOUNTER — Inpatient Hospital Stay (HOSPITAL_BASED_OUTPATIENT_CLINIC_OR_DEPARTMENT_OTHER): Payer: Medicare Other | Admitting: Hematology and Oncology

## 2016-08-03 ENCOUNTER — Inpatient Hospital Stay: Payer: Medicare Other

## 2016-08-03 ENCOUNTER — Other Ambulatory Visit: Payer: Self-pay | Admitting: *Deleted

## 2016-08-03 VITALS — BP 169/84 | HR 78 | Temp 95.7°F | Resp 18 | Wt 188.5 lb

## 2016-08-03 DIAGNOSIS — D72819 Decreased white blood cell count, unspecified: Secondary | ICD-10-CM | POA: Diagnosis not present

## 2016-08-03 DIAGNOSIS — E785 Hyperlipidemia, unspecified: Secondary | ICD-10-CM | POA: Diagnosis not present

## 2016-08-03 DIAGNOSIS — Z808 Family history of malignant neoplasm of other organs or systems: Secondary | ICD-10-CM | POA: Insufficient documentation

## 2016-08-03 DIAGNOSIS — D631 Anemia in chronic kidney disease: Secondary | ICD-10-CM | POA: Insufficient documentation

## 2016-08-03 DIAGNOSIS — N189 Chronic kidney disease, unspecified: Secondary | ICD-10-CM | POA: Diagnosis not present

## 2016-08-03 DIAGNOSIS — D61818 Other pancytopenia: Secondary | ICD-10-CM

## 2016-08-03 DIAGNOSIS — R634 Abnormal weight loss: Secondary | ICD-10-CM

## 2016-08-03 DIAGNOSIS — F200 Paranoid schizophrenia: Secondary | ICD-10-CM | POA: Insufficient documentation

## 2016-08-03 DIAGNOSIS — D72821 Monocytosis (symptomatic): Secondary | ICD-10-CM | POA: Diagnosis not present

## 2016-08-03 DIAGNOSIS — F17210 Nicotine dependence, cigarettes, uncomplicated: Secondary | ICD-10-CM | POA: Insufficient documentation

## 2016-08-03 DIAGNOSIS — Z79899 Other long term (current) drug therapy: Secondary | ICD-10-CM

## 2016-08-03 DIAGNOSIS — D696 Thrombocytopenia, unspecified: Secondary | ICD-10-CM

## 2016-08-03 DIAGNOSIS — F319 Bipolar disorder, unspecified: Secondary | ICD-10-CM | POA: Diagnosis not present

## 2016-08-03 DIAGNOSIS — D649 Anemia, unspecified: Secondary | ICD-10-CM

## 2016-08-03 DIAGNOSIS — E079 Disorder of thyroid, unspecified: Secondary | ICD-10-CM | POA: Insufficient documentation

## 2016-08-03 DIAGNOSIS — K219 Gastro-esophageal reflux disease without esophagitis: Secondary | ICD-10-CM | POA: Insufficient documentation

## 2016-08-03 DIAGNOSIS — I129 Hypertensive chronic kidney disease with stage 1 through stage 4 chronic kidney disease, or unspecified chronic kidney disease: Secondary | ICD-10-CM

## 2016-08-03 DIAGNOSIS — E538 Deficiency of other specified B group vitamins: Secondary | ICD-10-CM | POA: Insufficient documentation

## 2016-08-03 DIAGNOSIS — Z803 Family history of malignant neoplasm of breast: Secondary | ICD-10-CM | POA: Insufficient documentation

## 2016-08-03 LAB — COMPREHENSIVE METABOLIC PANEL
ALT: 25 U/L (ref 17–63)
AST: 31 U/L (ref 15–41)
Albumin: 4 g/dL (ref 3.5–5.0)
Alkaline Phosphatase: 52 U/L (ref 38–126)
Anion gap: 5 (ref 5–15)
BUN: 31 mg/dL — ABNORMAL HIGH (ref 6–20)
CO2: 25 mmol/L (ref 22–32)
Calcium: 9.4 mg/dL (ref 8.9–10.3)
Chloride: 110 mmol/L (ref 101–111)
Creatinine, Ser: 2.11 mg/dL — ABNORMAL HIGH (ref 0.61–1.24)
GFR calc Af Amer: 37 mL/min — ABNORMAL LOW (ref >60)
GFR calc non Af Amer: 32 mL/min — ABNORMAL LOW (ref >60)
Glucose, Bld: 96 mg/dL (ref 65–99)
Potassium: 4.1 mmol/L (ref 3.5–5.1)
Sodium: 140 mmol/L (ref 135–145)
Total Bilirubin: 0.7 mg/dL (ref 0.3–1.2)
Total Protein: 7 g/dL (ref 6.5–8.1)

## 2016-08-03 LAB — CBC WITH DIFFERENTIAL/PLATELET
Basophils Absolute: 0 10*3/uL (ref 0–0.1)
Basophils Relative: 1 %
Eosinophils Absolute: 0.1 10*3/uL (ref 0–0.7)
Eosinophils Relative: 3 %
HCT: 35.9 % — ABNORMAL LOW (ref 40.0–52.0)
Hemoglobin: 12.5 g/dL — ABNORMAL LOW (ref 13.0–18.0)
Lymphocytes Relative: 44 %
Lymphs Abs: 1.4 10*3/uL (ref 1.0–3.6)
MCH: 32.5 pg (ref 26.0–34.0)
MCHC: 34.8 g/dL (ref 32.0–36.0)
MCV: 93.4 fL (ref 80.0–100.0)
Monocytes Absolute: 0.5 10*3/uL (ref 0.2–1.0)
Monocytes Relative: 17 %
Neutro Abs: 1.1 10*3/uL — ABNORMAL LOW (ref 1.4–6.5)
Neutrophils Relative %: 35 %
Platelets: 63 10*3/uL — ABNORMAL LOW (ref 150–440)
RBC: 3.84 MIL/uL — ABNORMAL LOW (ref 4.40–5.90)
RDW: 14.1 % (ref 11.5–14.5)
WBC: 3.2 10*3/uL — ABNORMAL LOW (ref 3.8–10.6)

## 2016-08-03 LAB — FERRITIN: Ferritin: 71 ng/mL (ref 24–336)

## 2016-08-03 NOTE — Progress Notes (Signed)
[facility]  Clinic day:  08/03/16  Chief Complaint: Brandon Maynard. is a 60 y.o. male with anemia and thrombocytopenia who is seen for 1 month assessment.  HPI:   The patient was last seen in the hematology clinic on 07/06/2016.  At that time, he felt "pretty good".  Weight was up 4 pounds.  CBC revealed a hematocrit of 31.1, hemoglobin 10.9, MCV 92.9, platelets 82,000, WBC 4100 with an ANC of 2200.  Peripheral smear reveal no abnormalities.  Creatinine was 2.11 with a GFR of 32 ml/min.  SPEP revealed no monoclonal protein.  Kappa free light chains were 55.8, lambda free light chains were 38.2 with a ratio of 1.46.  MMA was 445 indicating B12 deficiency.  He received B12 weekly x 2 (07/12/2016 and 07/19/2016).  He states that the shots caused a reaction.  He described his reaction as "talking too much".  He started oral B12.  He notes that he used to weight 270 pounds.  He eats 3 meals/day plus snacks.  He has cut back in his sweats/junk flow secondary to "cash flow".   Past Medical History:  Diagnosis Date  . Anemia   . Bipolar disorder ([facility])   . Cataract    right eye  . DDD (degenerative disc disease)   . GERD (gastroesophageal reflux disease)   . Heart murmur   . Hyperlipidemia   . Hypertension   . Paranoid schizophrenia ([facility])   . 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 Thyroid disease     Past Surgical History:  Procedure Laterality Date  . Dermal Abrasion  1973    Family History  Problem Relation Age of Onset  . Brain cancer Maternal Uncle   . Breast cancer Mother   . Melanoma Mother   . Congestive Heart Failure Father   . Melanoma Father   . Breast cancer Maternal Aunt   . Diabetes Maternal Aunt     Social History:  reports that he has been smoking.  He does not have any smokeless tobacco history on file. He reports that he does not drink alcohol or use drugs.  He states that his mother died the week before Easter 04/28/2016).  The patient  states that he took drugs when he was younger (age [age]).  He took LSD, speed, and Germaine Pomfret (marijuana).  He never did IV drugs.  He previously drank alcohol.  He stopped drinking wine 30 years ago.  He lives in the [facility].  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  Contact number [phone].  He is accompanied by Delfin Edis from the group home today.  Allergies:  Allergies  Allergen Reactions  . Tetracyclines & Related Hives    Current Medications: Current Outpatient Prescriptions  Medication Sig Dispense Refill  . asenapine (SAPHRIS) 5 MG SUBL 24 hr tablet Place 2 tablets (10 mg total) under the tongue at bedtime. (Patient taking differently: Place 15 mg under the tongue at bedtime. ) 60 tablet 0  . bimatoprost (LUMIGAN) 0.01 % SOLN Place 1 drop into both eyes at bedtime.    . brimonidine-timolol (COMBIGAN) 0.2-0.5 % ophthalmic solution Place 1 drop into both eyes every 12 (twelve) hours.    . clonazePAM (KLONOPIN) 1 MG tablet Take 1 tablet (1 mg total) by mouth at bedtime. 30 tablet 0  . divalproex (DEPAKOTE ER) 500 MG 24 hr tablet Take 2 tablets (1,000 mg total) by mouth at bedtime. (Patient taking differently: Take 500 mg  by mouth at bedtime. Take one tablet by mouth in am and two tabs by mouth in pm) 60 tablet 0  . esomeprazole (NEXIUM) 40 MG capsule Take 1 capsule (40 mg total) by mouth daily. 30 capsule 5  . FLUoxetine (PROZAC) 20 MG tablet Take 2 tablets (40 mg total) by mouth daily. 60 tablet 0  . labetalol (NORMODYNE) 200 MG tablet Take 0.5 tablets (100 mg total) by mouth 2 (two) times daily. 30 tablet 5  . levothyroxine (SYNTHROID, LEVOTHROID) 50 MCG tablet Take 1 tablet (50 mcg total) by mouth daily. 30 tablet 5  . Multiple Vitamin (MULTIVITAMIN WITH MINERALS) TABS tablet Take 1 tablet by mouth daily.    Marland Kitchen pyridOXINE (VITAMIN B-6) 50 MG tablet Take 100 mg by mouth 2 (two) times daily.    . simvastatin (ZOCOR) 20 MG tablet Take 1 tablet (20 mg total) by mouth  daily. 30 tablet 5  . traZODone (DESYREL) 100 MG tablet Take 0.5 tablets (50 mg total) by mouth at bedtime. (Patient taking differently: Take 100 mg by mouth at bedtime. )     No current facility-administered medications for this visit.    Facility-Administered Medications Ordered in Other Visits  Medication Dose Route Frequency Provider Last Rate Last Dose  . cyanocobalamin ((VITAMIN B-12)) injection 1,000 mcg  1,000 mcg Intramuscular Once Lequita Asal, MD        Review of Systems:  GENERAL:  Feels "good".  No fevers or sweats.  Weight loss of 10 pounds since last visit. PERFORMANCE STATUS (ECOG):  1 HEENT:  Glaucoma.  No runny nose, sore throat, mouth sores or tenderness. Lungs: No shortness of breath or cough.  No hemoptysis. Cardiac:  No chest pain, palpitations, orthopnea, or PND. GI:  Reflux.  No nausea, vomiting, diarrhea, constipation, melena or hematochezia.  Colonoscopy at age 26 (declines). GU:  No urgency, frequency, dysuria, or hematuria. Musculoskeletal:  No back pain.  No joint pain.  No muscle tenderness. Extremities:  No pain or swelling. Skin:  Bruises easily.  No rashes or skin changes. Neuro:  Short term memory poor.  No headache, numbness or weakness, balance or coordination issues. Endocrine:  No diabetes, thyroid issues, hot flashes or night sweats. Psych:  Schizophrenia.  Bipolar.   Pain:  No focal pain. Review of systems:  All other systems reviewed and found to be negative.  Physical Exam: Blood pressure (!) 169/84, pulse 78, temperature (!) 95.7 F (35.4 C), temperature source Tympanic, resp. rate 18, weight 188 lb 7.9 oz (85.5 kg). GENERAL:  Well developed, well nourished, sitting comfortably in the exam room in no acute distress. Talkaltive. MENTAL STATUS:  Alert and oriented to person, place and time. HEAD:  Pearline Cables hair.  Male pattern baldness.  Normocephalic, atraumatic, face symmetric, no Cushingoid features. EYES:  Glasses.  Blue eyes.  No  conjunctivitis or scleral icterus. RESPIRATORY:  Clear to auscultation without rales, wheezes or rhonchi. CARDIOVASCULAR:  Regular rate and rhythm without murmur, rub or gallop. ABDOMEN:  Soft, non-tender, with active bowel sounds, and no appreciable hepatosplenomegaly.  No masses. SKIN:  No rashes, ulcers or lesions. EXTREMITIES: Chronic lower extremity edema.  No  skin discoloration or tenderness.  No palpable cords. LYMPH NODES: No palpable cervical, supraclavicular, axillary or inguinal adenopathy  NEUROLOGICAL: Unremarkable. PSYCH:  Appropriate.   Orders Only on 08/03/2016  Component Date Value Ref Range Status  . WBC 08/03/2016 3.2* 3.8 - 10.6 K/uL Final  . RBC 08/03/2016 3.84* 4.40 - 5.90 MIL/uL Final  .  Hemoglobin 08/03/2016 12.5* 13.0 - 18.0 g/dL Final  . HCT 08/03/2016 35.9* 40.0 - 52.0 % Final  . MCV 08/03/2016 93.4  80.0 - 100.0 fL Final  . MCH 08/03/2016 32.5  26.0 - 34.0 pg Final  . MCHC 08/03/2016 34.8  32.0 - 36.0 g/dL Final  . RDW 08/03/2016 14.1  11.5 - 14.5 % Final  . Platelets 08/03/2016 63* 150 - 440 K/uL Final  . Neutrophils Relative % 08/03/2016 35%  % Final  . Neutro Abs 08/03/2016 1.1* 1.4 - 6.5 K/uL Final  . Lymphocytes Relative 08/03/2016 44%  % Final  . Lymphs Abs 08/03/2016 1.4  1.0 - 3.6 K/uL Final  . Monocytes Relative 08/03/2016 17%  % Final  . Monocytes Absolute 08/03/2016 0.5  0.2 - 1.0 K/uL Final  . Eosinophils Relative 08/03/2016 3%  % Final  . Eosinophils Absolute 08/03/2016 0.1  0 - 0.7 K/uL Final  . Basophils Relative 08/03/2016 1%  % Final  . Basophils Absolute 08/03/2016 0.0  0 - 0.1 K/uL Final  . Ferritin 08/03/2016 71  24 - 336 ng/mL Final  . Sodium 08/03/2016 140  135 - 145 mmol/L Final  . Potassium 08/03/2016 4.1  3.5 - 5.1 mmol/L Final  . Chloride 08/03/2016 110  101 - 111 mmol/L Final  . CO2 08/03/2016 25  22 - 32 mmol/L Final  . Glucose, Bld 08/03/2016 96  65 - 99 mg/dL Final  . BUN 08/03/2016 31* 6 - 20 mg/dL Final  . Creatinine,  Ser 08/03/2016 2.11* 0.61 - 1.24 mg/dL Final  . Calcium 08/03/2016 9.4  8.9 - 10.3 mg/dL Final  . Total Protein 08/03/2016 7.0  6.5 - 8.1 g/dL Final  . Albumin 08/03/2016 4.0  3.5 - 5.0 g/dL Final  . AST 08/03/2016 31  15 - 41 U/L Final  . ALT 08/03/2016 25  17 - 63 U/L Final  . Alkaline Phosphatase 08/03/2016 52  38 - 126 U/L Final  . Total Bilirubin 08/03/2016 0.7  0.3 - 1.2 mg/dL Final  . GFR calc non Af Amer 08/03/2016 32* >60 mL/min Final  . GFR calc Af Amer 08/03/2016 37* >60 mL/min Final   Comment: (NOTE) The eGFR has been calculated using the CKD EPI equation. This calculation has not been validated in all clinical situations. eGFR's persistently <60 mL/min signify possible Chronic Kidney Disease.   Georgiann Hahn gap 08/03/2016 5  5 - 15 Final    Assessment:  Brandon Maynard. is a 60 y.o. male with schizophrenia, bipolar disorder, and psychotic disorder with at least a 6 year history of anemia and progressive thrombocytopenia.  He has mild leukopenia with a mild monocytosis (10-11%).   He likely has multi-factorial anemia.  He has a distant history of iron deficiency anemia.  He had a colonoscopy 17 year ago (age 64).  Diet appears good (although he notes no fresh vegetables).  He denies any melena or hematochezia.  Ferritin was 82 (normal) on 05/12/2016.    He has chronic kidney disease (CKD) associated with a 30 year history of Lithium use (stoped in the 1990s).  CKD is contributing to his anemia.  Creatinine has been stable between 2.04-2.38 (CrCl 28-31 ml/min).   He is on Depakote which has a 1-27% incidence of thrombocytopenia (dose related).  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  Prozac and Trazodone have case reports of associated anemia.  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  Work-up on 06/16/2016 noted a hematocrit  34.6, hemoglobin 11.9, MCV 94.3, platelets 72,000, white count 4600 with an ANC of 2100. The following were normal:  ANA,  hepatitis B surface antigen , hepatitis B core antibody total , hepatitis C antibody, HIV antibody, PTT, PT, iron studies (iron saturation 14% and TIBC of 383), ESR, folate, B12 (326; low normal). Reticulocyte count was 1.5%.  Additional labs on 07/06/2016 revealed a normal SPEP and free light chains.  Peripheral smear was normal.  He has B12 deficiency.  MMA was 445 on 07/06/2016.  He received B12 x 2 (07/12/2016 and 07/19/2016), but stopped secondary to a "reaction".  He is on oral B12 and tolerating well.  Abdominal ultrasound on 06/28/2016 revealed a spleen upper limits of normal (13.2 cm).  He has ongoing weight loss.  He has no nausea, vomiting or diarrhea.  TSH was normal on 05/12/2016.  Plan: 1.  Labs today:  CBC with diff, epo level. 2.  Review labs from last visit.  Discuss plan for bone marrow aspirate and biopsy.  Procedure, risk and benefits discussed in detail. 3.  Discuss B12 deficiency and treatment.  Discuss checking B12 level next month to ensure adequate absorption. 4.  Discuss imaging studies (PET scan) if weight loss continues. 5.  Discuss guaiac cards if patient remains adamant about no colonoscopy. 6.  Schedule bone marrow aspirate and biopsy- pancytopenia 7.  RTC in 10 days after marrow for MD review   Lequita Asal, MD  08/03/2016, 12:32 PM

## 2016-08-03 NOTE — Progress Notes (Signed)
Patient states he has started smoking after stopping for 20 years.  States his mother passed away @ Easter and he has been stressed over her death.

## 2016-08-04 LAB — ERYTHROPOIETIN: Erythropoietin: 12.5 m[IU]/mL (ref 2.6–18.5)

## 2016-08-05 ENCOUNTER — Other Ambulatory Visit: Payer: Self-pay | Admitting: Radiology

## 2016-08-05 ENCOUNTER — Other Ambulatory Visit: Payer: Self-pay | Admitting: Physician Assistant

## 2016-08-06 ENCOUNTER — Ambulatory Visit: Payer: Self-pay | Admitting: Hematology and Oncology

## 2016-08-06 ENCOUNTER — Other Ambulatory Visit: Payer: Self-pay

## 2016-08-06 ENCOUNTER — Encounter: Payer: Self-pay | Admitting: Hematology and Oncology

## 2016-08-06 ENCOUNTER — Ambulatory Visit
Admission: RE | Admit: 2016-08-06 | Discharge: 2016-08-06 | Disposition: A | Discharge status: Home / Self Care | Payer: Medicare Other | Source: Ambulatory Visit | Attending: Hematology and Oncology | Admitting: Hematology and Oncology

## 2016-08-06 DIAGNOSIS — D61818 Other pancytopenia: Secondary | ICD-10-CM | POA: Diagnosis not present

## 2016-08-06 LAB — CBC
HEMATOCRIT: 33.6 % — AB (ref 40.0–52.0)
HEMOGLOBIN: 11.7 g/dL — AB (ref 13.0–18.0)
MCH: 32.6 pg (ref 26.0–34.0)
MCHC: 34.8 g/dL (ref 32.0–36.0)
MCV: 93.5 fL (ref 80.0–100.0)
Platelets: 64 10*3/uL — ABNORMAL LOW (ref 150–440)
RBC: 3.59 MIL/uL — ABNORMAL LOW (ref 4.40–5.90)
RDW: 14.2 % (ref 11.5–14.5)
WBC: 4.6 10*3/uL (ref 3.8–10.6)

## 2016-08-06 LAB — DIFFERENTIAL
Basophils Absolute: 0 10*3/uL (ref 0–0.1)
Basophils Relative: 1 %
Eosinophils Absolute: 0.1 10*3/uL (ref 0–0.7)
Eosinophils Relative: 2 %
Lymphocytes Relative: 39 %
Lymphs Abs: 1.8 10*3/uL (ref 1.0–3.6)
Monocytes Absolute: 0.7 10*3/uL (ref 0.2–1.0)
Monocytes Relative: 15 %
Neutro Abs: 2 10*3/uL (ref 1.4–6.5)
Neutrophils Relative %: 43 %

## 2016-08-06 MED ORDER — MIDAZOLAM HCL 5 MG/5ML IJ SOLN
INTRAMUSCULAR | Status: AC | PRN
  Administered 2016-08-06: 1 mg via INTRAVENOUS
  Administered 2016-08-06: 0.5 mg via INTRAVENOUS

## 2016-08-06 MED ORDER — SODIUM CHLORIDE 0.9 % IV SOLN
INTRAVENOUS | Status: DC
  Administered 2016-08-06: 11:00:00 via INTRAVENOUS

## 2016-08-06 NOTE — Procedures (Signed)
CT-guided  R iliac bone marrow aspiration and core biopsy No complication No blood loss. See complete dictation in [facility] PACS  

## 2016-08-10 ENCOUNTER — Inpatient Hospital Stay: Payer: Medicare Other

## 2016-08-12 ENCOUNTER — Ambulatory Visit: Payer: Medicare Other

## 2016-08-17 ENCOUNTER — Ambulatory Visit: Payer: Self-pay

## 2016-08-19 ENCOUNTER — Encounter: Payer: Self-pay | Admitting: Hematology and Oncology

## 2016-08-19 ENCOUNTER — Inpatient Hospital Stay (HOSPITAL_BASED_OUTPATIENT_CLINIC_OR_DEPARTMENT_OTHER): Payer: Medicare Other | Admitting: Hematology and Oncology

## 2016-08-19 VITALS — BP 126/80 | HR 54 | Temp 96.3°F | Resp 18 | Wt 186.3 lb

## 2016-08-19 DIAGNOSIS — F319 Bipolar disorder, unspecified: Secondary | ICD-10-CM

## 2016-08-19 DIAGNOSIS — D649 Anemia, unspecified: Secondary | ICD-10-CM

## 2016-08-19 DIAGNOSIS — D72819 Decreased white blood cell count, unspecified: Secondary | ICD-10-CM

## 2016-08-19 DIAGNOSIS — E538 Deficiency of other specified B group vitamins: Secondary | ICD-10-CM

## 2016-08-19 DIAGNOSIS — N189 Chronic kidney disease, unspecified: Secondary | ICD-10-CM

## 2016-08-19 DIAGNOSIS — D72821 Monocytosis (symptomatic): Secondary | ICD-10-CM

## 2016-08-19 DIAGNOSIS — D696 Thrombocytopenia, unspecified: Secondary | ICD-10-CM | POA: Diagnosis not present

## 2016-08-19 DIAGNOSIS — D631 Anemia in chronic kidney disease: Secondary | ICD-10-CM | POA: Diagnosis not present

## 2016-08-19 DIAGNOSIS — F200 Paranoid schizophrenia: Secondary | ICD-10-CM

## 2016-08-19 DIAGNOSIS — E785 Hyperlipidemia, unspecified: Secondary | ICD-10-CM

## 2016-08-19 DIAGNOSIS — I129 Hypertensive chronic kidney disease with stage 1 through stage 4 chronic kidney disease, or unspecified chronic kidney disease: Secondary | ICD-10-CM

## 2016-08-19 DIAGNOSIS — E079 Disorder of thyroid, unspecified: Secondary | ICD-10-CM

## 2016-08-19 DIAGNOSIS — K219 Gastro-esophageal reflux disease without esophagitis: Secondary | ICD-10-CM

## 2016-08-19 DIAGNOSIS — F17210 Nicotine dependence, cigarettes, uncomplicated: Secondary | ICD-10-CM

## 2016-08-19 DIAGNOSIS — R634 Abnormal weight loss: Secondary | ICD-10-CM

## 2016-08-19 DIAGNOSIS — Z79899 Other long term (current) drug therapy: Secondary | ICD-10-CM

## 2016-08-19 NOTE — Progress Notes (Signed)
[facility] day:  08/19/16  Chief Complaint: Brandon Maynard. is a 61 y.o. male with anemia and thrombocytopenia who is seen for review of interval bone marrow and discussion regarding direction of therapy.  HPI:   The patient was last seen in the hematology clinic on 08/03/2016.  At that time, he denied any complaint.  He was taking oral B12.  He had lost 10 pounds.  CBC revealed a hematocrit of 35.9, hemoglobin 12.5, MCV 93.4, platelets 63,000, WBC 3200 with an ANC of 1100.  Epo level was 12.5.  We discussed bone marrow aspirate and biopsy.  Bone marrow aspirate and biopsy on 08/06/2016 revealed a variably cellular marrow (20-50%) with relatively increased erythroid precursors, marginally increased monocytic cells, and mild trilineage dyspoiesis.  There were multiple small granulomas, non-specific.  GMS and AFB stains were negative.  There was slight patchy increase in reticulin.  Storage iron was present.  Flow cytometry was negative.  Cytogenetics were normal (46, XY).  Findings were felt non-diagnostic.  Cytopenias were felt likely multi-factorial and due to chronic disease and drug/medication effect.  Symptomatically, he denies any complaints.  He notes that he is extremely hungry.  He gets 3 meals a day in the [place] group home.  Other residents are gaining weight.  He is taking his oral B12.  He is losing weight.  He denies any fevers or sweats.  He denies any pain.   Past Medical History:  Diagnosis Date  . Anemia   . Bipolar disorder ([place])   . Cataract    right eye  . DDD (degenerative disc disease)   . GERD (gastroesophageal reflux disease)   . Heart murmur   . Hyperlipidemia   . Hypertension   . Paranoid schizophrenia ([place])   . 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 Thyroid disease     Past Surgical History:  Procedure Laterality Date  . Dermal Abrasion  1973    Family History  Problem Relation Age of Onset  . Brain cancer  Maternal Uncle   . Breast cancer Mother   . Melanoma Mother   . Congestive Heart Failure Father   . Melanoma Father   . Breast cancer Maternal Aunt   . Diabetes Maternal Aunt     Social History:  reports that he has been smoking.  He has never used smokeless tobacco. He reports that he does not drink alcohol or use drugs.  He states that his mother died the week before Easter 20-Jan-2016).  The patient states that he took drugs when he was younger (age [age]).  He took LSD, speed, and Karle Starch (marijuana).  He never did IV drugs.  He previously drank alcohol.  He stopped drinking wine 30 years ago.  He lives in the [place].  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  Contact number [phone].  He is accompanied by [name] from the group home today.  Allergies:  Allergies  Allergen Reactions  . Tetracyclines & Related Hives    Current Medications: Current Outpatient Prescriptions  Medication Sig Dispense Refill  . asenapine (SAPHRIS) 5 MG SUBL 24 hr tablet Place 2 tablets (10 mg total) under the tongue at bedtime. (Patient taking differently: Place 15 mg under the tongue at bedtime. ) 60 tablet 0  . bimatoprost (LUMIGAN) 0.01 % SOLN Place 1 drop into both eyes at bedtime.    . brimonidine-timolol (COMBIGAN) 0.2-0.5 % ophthalmic solution Place 1 drop into both eyes  every 12 (twelve) hours.    . clonazePAM (KLONOPIN) 1 MG tablet Take 1 tablet (1 mg total) by mouth at bedtime. 30 tablet 0  . divalproex (DEPAKOTE ER) 500 MG 24 hr tablet Take 2 tablets (1,000 mg total) by mouth at bedtime. (Patient taking differently: Take 500 mg by mouth at bedtime. Take one tablet by mouth in am and two tabs by mouth in pm) 60 tablet 0  . esomeprazole (NEXIUM) 40 MG capsule Take 1 capsule (40 mg total) by mouth daily. 30 capsule 5  . FLUoxetine (PROZAC) 20 MG tablet Take 2 tablets (40 mg total) by mouth daily. 60 tablet 0  . labetalol (NORMODYNE) 200 MG tablet Take 0.5 tablets (100 mg total) by mouth 2  (two) times daily. 30 tablet 5  . levothyroxine (SYNTHROID, LEVOTHROID) 50 MCG tablet Take 1 tablet (50 mcg total) by mouth daily. 30 tablet 5  . Multiple Vitamin (MULTIVITAMIN WITH MINERALS) TABS tablet Take 1 tablet by mouth daily.    Marland Kitchen pyridOXINE (VITAMIN B-6) 50 MG tablet Take 100 mg by mouth 2 (two) times daily.    . simvastatin (ZOCOR) 20 MG tablet Take 1 tablet (20 mg total) by mouth daily. 30 tablet 5  . traZODone (DESYREL) 100 MG tablet Take 0.5 tablets (50 mg total) by mouth at bedtime. (Patient taking differently: Take 100 mg by mouth at bedtime. )     No current facility-administered medications for this visit.    Facility-Administered Medications Ordered in Other Visits  Medication Dose Route Frequency Provider Last Rate Last Dose  . cyanocobalamin ((VITAMIN B-12)) injection 1,000 mcg  1,000 mcg Intramuscular Once Lequita Asal, MD        Review of Systems:  GENERAL:  Feels "ok".  No fevers or sweats.  Weight loss of 2 pounds since last visit. PERFORMANCE STATUS (ECOG):  1 HEENT:  Glaucoma.  No runny nose, sore throat, mouth sores or tenderness. Lungs: No shortness of breath or cough.  No hemoptysis. Cardiac:  No chest pain, palpitations, orthopnea, or PND. GI:  Reflux.  No nausea, vomiting, diarrhea, constipation, melena or hematochezia.  Colonoscopy at age 74 (declines). GU:  No urgency, frequency, dysuria, or hematuria. Musculoskeletal:  No back pain.  No joint pain.  No muscle tenderness. Extremities:  No pain or swelling. Skin:  Bruises easily.  No rashes or skin changes. Neuro:  Short term memory poor.  No headache, numbness or weakness, balance or coordination issues. Endocrine:  No diabetes, thyroid issues, hot flashes or night sweats. Psych:  Schizophrenia.  Bipolar.   Pain:  No focal pain. Review of systems:  All other systems reviewed and found to be negative.  Physical Exam: Blood pressure 126/80, pulse (!) 54, temperature (!) 96.3 F (35.7 C),  temperature source Tympanic, resp. rate 18, weight 186 lb 4.6 oz (84.5 kg). GENERAL:  Well developed, well nourished, sitting comfortably in the exam room in no acute distress. Talkaltive. MENTAL STATUS:  Alert and oriented to person, place and time. HEAD:  Pearline Cables hair.  Male pattern baldness.  Normocephalic, atraumatic, face symmetric, no Cushingoid features. EYES:  Glasses.  Blue eyes.  No conjunctivitis or scleral icterus. ENT:  Oropharynx clear without lesion.  Tongue normal. Mucous membranes  RESPIRATORY:  Clear to auscultation without rales, wheezes or rhonchi. CARDIOVASCULAR:  Regular rate and rhythm without murmur, rub or gallop. ABDOMEN:  Soft, non-tender, with active bowel sounds, and no appreciable hepatosplenomegaly.  No masses. SKIN:  No rashes, ulcers or lesions. EXTREMITIES: Chronic lower  extremity edema.  No  skin discoloration or tenderness.  No palpable cords. LYMPH NODES: No palpable cervical, supraclavicular, axillary or inguinal adenopathy  NEUROLOGICAL: Unremarkable. PSYCH:  Appropriate.   No visits with results within 3 Day(s) from this visit.  Latest known visit with results is:  Hospital Outpatient Visit on 08/06/2016  Component Date Value Ref Range Status  . WBC 08/06/2016 4.6  3.8 - 10.6 K/uL Final  . RBC 08/06/2016 3.59* 4.40 - 5.90 MIL/uL Final  . Hemoglobin 08/06/2016 11.7* 13.0 - 18.0 g/dL Final  . HCT 08/06/2016 33.6* 40.0 - 52.0 % Final  . MCV 08/06/2016 93.5  80.0 - 100.0 fL Final  . MCH 08/06/2016 32.6  26.0 - 34.0 pg Final  . MCHC 08/06/2016 34.8  32.0 - 36.0 g/dL Final  . RDW 08/06/2016 14.2  11.5 - 14.5 % Final  . Platelets 08/06/2016 64* 150 - 440 K/uL Final  . Neutrophils Relative % 08/06/2016 43%  % Final  . Neutro Abs 08/06/2016 2.0  1.4 - 6.5 K/uL Final  . Lymphocytes Relative 08/06/2016 39%  % Final  . Lymphs Abs 08/06/2016 1.8  1.0 - 3.6 K/uL Final  . Monocytes Relative 08/06/2016 15%  % Final  . Monocytes Absolute 08/06/2016 0.7  0.2 - 1.0  K/uL Final  . Eosinophils Relative 08/06/2016 2%  % Final  . Eosinophils Absolute 08/06/2016 0.1  0 - 0.7 K/uL Final  . Basophils Relative 08/06/2016 1%  % Final  . Basophils Absolute 08/06/2016 0.0  0 - 0.1 K/uL Final    Assessment:  Bubber T Gopal Brooke Bonito. is a 60 y.o. male with schizophrenia, bipolar disorder, and psychotic disorder with at least a 6 year history of anemia and progressive thrombocytopenia.  He has mild leukopenia with a mild monocytosis (10-11%).   He likely has multi-factorial anemia.  He has a distant history of iron deficiency anemia.  He had a colonoscopy 17 year ago (age 88).  Diet appears good (although he notes no fresh vegetables).  He denies any melena or hematochezia.  Ferritin was 82 (normal) on 05/12/2016.    He has chronic kidney disease (CKD) associated with a 30 year history of Lithium use (stoped in the 1990s).  CKD is contributing to his anemia.  Creatinine has been stable between 2.04-2.38 (CrCl 28-31 ml/min).   He is on Depakote which has a 1-27% incidence of thrombocytopenia (dose related).  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  Prozac and Trazodone have case reports of associated anemia.  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  Work-up on 06/16/2016 noted a hematocrit 34.6, hemoglobin 11.9, MCV 94.3, platelets 72,000, white count 4600 with an [place] of 2100. The following were normal:  ANA, hepatitis B surface antigen , hepatitis B core antibody total , hepatitis C antibody, HIV antibody, PTT, PT, iron studies (iron saturation 14% and TIBC of 383), ESR, folate, B12 (326; low normal). Reticulocyte count was 1.5%.  Additional labs on 07/06/2016 revealed a normal SPEP and free light chains.  Peripheral smear was normal.  Bone marrow biopsy on 08/06/2016 revealed a variably cellular marrow (20-50%) with relatively increased erythroid precursors, marginally increased monocytic cells, and mild trilineage dyspoiesis.  There were multiple  small granulomas, non-specific.  GMS and AFB stains were negative.  There was slight patchy increase in reticulin.  Storage iron was present.  Flow cytometry was negative.  Cytogenetics were normal (46, XY).  Cytopenias were felt likely multi-factorial and due to chronic disease  and drug/medication effect.  He has B12 deficiency.  MMA was 445 on 07/06/2016.  He received B12 x 2 (07/12/2016 and 07/19/2016), but stopped secondary to a "reaction".  He is on oral B12 and tolerating well.  Abdominal ultrasound on 06/28/2016 revealed a spleen upper limits of normal (13.2 cm).  He has ongoing weight loss.  He has lost 23 pounds since 01/2016.  He has no nausea, vomiting or diarrhea.  TSH was normal on 05/12/2016.  Plan: 1.  Review bone marrow aspirate and biopsy. 2.  Discuss concern for ongoing weight loss.  Discuss scans to rule out malignancy. 3.  PET scan 4.  RTC after PET scan for MD review.   Lequita Asal, MD  08/19/2016, 12:06 PM

## 2016-08-26 ENCOUNTER — Encounter
Admission: RE | Admit: 2016-08-26 | Discharge: 2016-08-26 | Disposition: A | Discharge status: Home / Self Care | Payer: Medicare Other | Source: Ambulatory Visit | Attending: Hematology and Oncology | Admitting: Hematology and Oncology

## 2016-08-26 DIAGNOSIS — R634 Abnormal weight loss: Secondary | ICD-10-CM | POA: Insufficient documentation

## 2016-08-31 ENCOUNTER — Ambulatory Visit: Payer: Self-pay | Admitting: Hematology and Oncology

## 2016-09-01 ENCOUNTER — Encounter
Admission: RE | Admit: 2016-09-01 | Discharge: 2016-09-01 | Disposition: A | Discharge status: Home / Self Care | Payer: Medicare Other | Source: Ambulatory Visit | Attending: Hematology and Oncology | Admitting: Hematology and Oncology

## 2016-09-01 DIAGNOSIS — R634 Abnormal weight loss: Secondary | ICD-10-CM | POA: Insufficient documentation

## 2016-09-01 LAB — GLUCOSE, CAPILLARY: Glucose-Capillary: 81 mg/dL (ref 65–99)

## 2016-09-01 MED ORDER — FLUDEOXYGLUCOSE F - 18 (FDG) INJECTION
12.0000 | Freq: Once | INTRAVENOUS | Status: AC | PRN
  Administered 2016-09-01: 12.93 via INTRAVENOUS

## 2016-09-02 ENCOUNTER — Inpatient Hospital Stay: Payer: Medicare Other | Attending: Hematology and Oncology | Admitting: Hematology and Oncology

## 2016-09-02 ENCOUNTER — Other Ambulatory Visit: Payer: Self-pay | Admitting: *Deleted

## 2016-09-02 VITALS — BP 112/69 | HR 56 | Temp 96.5°F | Resp 18 | Wt 187.6 lb

## 2016-09-02 DIAGNOSIS — F209 Schizophrenia, unspecified: Secondary | ICD-10-CM

## 2016-09-02 DIAGNOSIS — I129 Hypertensive chronic kidney disease with stage 1 through stage 4 chronic kidney disease, or unspecified chronic kidney disease: Secondary | ICD-10-CM | POA: Insufficient documentation

## 2016-09-02 DIAGNOSIS — D631 Anemia in chronic kidney disease: Secondary | ICD-10-CM | POA: Insufficient documentation

## 2016-09-02 DIAGNOSIS — D469 Myelodysplastic syndrome, unspecified: Secondary | ICD-10-CM | POA: Insufficient documentation

## 2016-09-02 DIAGNOSIS — D649 Anemia, unspecified: Secondary | ICD-10-CM

## 2016-09-02 DIAGNOSIS — E079 Disorder of thyroid, unspecified: Secondary | ICD-10-CM | POA: Insufficient documentation

## 2016-09-02 DIAGNOSIS — Z803 Family history of malignant neoplasm of breast: Secondary | ICD-10-CM | POA: Insufficient documentation

## 2016-09-02 DIAGNOSIS — Z808 Family history of malignant neoplasm of other organs or systems: Secondary | ICD-10-CM | POA: Insufficient documentation

## 2016-09-02 DIAGNOSIS — F17210 Nicotine dependence, cigarettes, uncomplicated: Secondary | ICD-10-CM | POA: Insufficient documentation

## 2016-09-02 DIAGNOSIS — Z79899 Other long term (current) drug therapy: Secondary | ICD-10-CM

## 2016-09-02 DIAGNOSIS — D696 Thrombocytopenia, unspecified: Secondary | ICD-10-CM

## 2016-09-02 DIAGNOSIS — E538 Deficiency of other specified B group vitamins: Secondary | ICD-10-CM | POA: Diagnosis not present

## 2016-09-02 DIAGNOSIS — E785 Hyperlipidemia, unspecified: Secondary | ICD-10-CM | POA: Diagnosis not present

## 2016-09-02 DIAGNOSIS — K219 Gastro-esophageal reflux disease without esophagitis: Secondary | ICD-10-CM | POA: Diagnosis not present

## 2016-09-02 DIAGNOSIS — N189 Chronic kidney disease, unspecified: Secondary | ICD-10-CM | POA: Insufficient documentation

## 2016-09-02 DIAGNOSIS — F319 Bipolar disorder, unspecified: Secondary | ICD-10-CM | POA: Insufficient documentation

## 2016-09-02 DIAGNOSIS — R634 Abnormal weight loss: Secondary | ICD-10-CM

## 2016-09-02 NOTE — Progress Notes (Signed)
[facility] day:  09/02/16  Chief Complaint: Brandon Maynard. is a 60 y.o. male with anemia and thrombocytopenia who is seen for review of interval PET scan.  HPI:   The patient was last seen in the hematology clinic on 08/19/2016.  At that time, bone marrow was reviewed.  Bone marrow revealed no evidence of leukemia, lymphoma, or myelodysplasia.  Cytopenias were felt likely multi-factorial and due to chronic disease and drug/medication effect.  Because of concern for weight loss, decision was made to proceed with PET scan.  PET scan on 09/01/2016 revealed no hypermetabolic source of primary malignancy.  There was mildly hypermetabolic patchy ground-glass in the inferior right lower lobe, likely infectious or inflammatory in etiology.   There was no nodular or masslike features.  Symptomatically, he denies any new complaints. He stopped smoking a cigarellos.  He changed to milder cigarettes. He denies any fevers or infections.  He denies any bruising or bleeding.  He notes that he is still not getting enough to eat.  Weight is up 1 pound.   Past Medical History:  Diagnosis Date  . Anemia   . Bipolar disorder ([place])   . Cataract    right eye  . DDD (degenerative disc disease)   . GERD (gastroesophageal reflux disease)   . Heart murmur   . Hyperlipidemia   . Hypertension   . Paranoid schizophrenia ([place])   . 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 Thyroid disease     Past Surgical History:  Procedure Laterality Date  . Dermal Abrasion  1973    Family History  Problem Relation Age of Onset  . Brain cancer Maternal Uncle   . Breast cancer Mother   . Melanoma Mother   . Congestive Heart Failure Father   . Melanoma Father   . Breast cancer Maternal Aunt   . Diabetes Maternal Aunt     Social History:  reports that he has been smoking.  He has never used smokeless tobacco. He reports that he does not drink alcohol or use drugs.  He states that  his mother died the week before Easter 2016/02/23).  The patient states that he took drugs when he was younger (age [age]).  He took LSD, speed, and Karle Starch (marijuana).  He never did IV drugs.  He previously drank alcohol.  He stopped drinking wine 30 years ago.  He lives in the [place].  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  Contact number [phone].  He is accompanied by [name] from the group home today.  Allergies:  Allergies  Allergen Reactions  . Tetracyclines & Related Hives    Current Medications: Current Outpatient Prescriptions  Medication Sig Dispense Refill  . asenapine (SAPHRIS) 5 MG SUBL 24 hr tablet Place 2 tablets (10 mg total) under the tongue at bedtime. (Patient taking differently: Place 15 mg under the tongue at bedtime. ) 60 tablet 0  . bimatoprost (LUMIGAN) 0.01 % SOLN Place 1 drop into both eyes at bedtime.    . brimonidine-timolol (COMBIGAN) 0.2-0.5 % ophthalmic solution Place 1 drop into both eyes every 12 (twelve) hours.    . clonazePAM (KLONOPIN) 1 MG tablet Take 1 tablet (1 mg total) by mouth at bedtime. 30 tablet 0  . divalproex (DEPAKOTE ER) 500 MG 24 hr tablet Take 2 tablets (1,000 mg total) by mouth at bedtime. (Patient taking differently: Take 500 mg by mouth at bedtime. Take one tablet by mouth in  am and two tabs by mouth in pm) 60 tablet 0  . esomeprazole (NEXIUM) 40 MG capsule Take 1 capsule (40 mg total) by mouth daily. 30 capsule 5  . FLUoxetine (PROZAC) 20 MG tablet Take 2 tablets (40 mg total) by mouth daily. 60 tablet 0  . labetalol (NORMODYNE) 200 MG tablet Take 0.5 tablets (100 mg total) by mouth 2 (two) times daily. 30 tablet 5  . levothyroxine (SYNTHROID, LEVOTHROID) 50 MCG tablet Take 1 tablet (50 mcg total) by mouth daily. 30 tablet 5  . Multiple Vitamin (MULTIVITAMIN WITH MINERALS) TABS tablet Take 1 tablet by mouth daily.    Marland Kitchen pyridOXINE (VITAMIN B-6) 50 MG tablet Take 100 mg by mouth 2 (two) times daily.    . simvastatin (ZOCOR)  20 MG tablet Take 1 tablet (20 mg total) by mouth daily. 30 tablet 5  . traZODone (DESYREL) 100 MG tablet Take 0.5 tablets (50 mg total) by mouth at bedtime. (Patient taking differently: Take 100 mg by mouth at bedtime. )     No current facility-administered medications for this visit.    Facility-Administered Medications Ordered in Other Visits  Medication Dose Route Frequency Provider Last Rate Last Dose  . cyanocobalamin ((VITAMIN B-12)) injection 1,000 mcg  1,000 mcg Intramuscular Once Lequita Asal, MD        Review of Systems:  GENERAL:  Feels "ok".  No fevers or sweats.  Weight up 1 pound since last visit. PERFORMANCE STATUS (ECOG):  1 HEENT:  Glaucoma.  No runny nose, sore throat, mouth sores or tenderness. Lungs: No shortness of breath or cough.  No hemoptysis. Cardiac:  No chest pain, palpitations, orthopnea, or PND. GI:  Reflux.  Hungry.  No nausea, vomiting, diarrhea, constipation, melena or hematochezia.  Colonoscopy at age 56 (declines). GU:  No urgency, frequency, dysuria, or hematuria. Musculoskeletal:  No back pain.  No joint pain.  No muscle tenderness. Extremities:  No pain or swelling. Skin:  Bruises easily.  No rashes or skin changes. Neuro:  Short term memory poor.  No headache, numbness or weakness, balance or coordination issues. Endocrine:  No diabetes, thyroid issues, hot flashes or night sweats. Psych:  Schizophrenia.  Bipolar disorder.   Pain:  No focal pain. Review of systems:  All other systems reviewed and found to be negative.  Physical Exam: Blood pressure 112/69, pulse (!) 56, temperature (!) 96.5 F (35.8 C), temperature source Tympanic, resp. rate 18, weight 187 lb 9.8 oz (85.1 kg). GENERAL:  Well developed, well nourished, gentleman sitting comfortably in the exam room in no acute distress. Talkative. MENTAL STATUS:  Alert and oriented to person, place and time. HEAD:  Pearline Cables hair.  Male pattern baldness.  Normocephalic, atraumatic, face  symmetric, no Cushingoid features. EYES:  Glasses.  Blue eyes.  No conjunctivitis or scleral icterus. ENT:  Oropharynx clear without lesion.  Tongue normal. Mucous membranes  RESPIRATORY:  Clear to auscultation without rales, wheezes or rhonchi. CARDIOVASCULAR:  Regular rate and rhythm without murmur, rub or gallop. ABDOMEN:  Soft, non-tender, with active bowel sounds, and no appreciable hepatosplenomegaly.  No masses. SKIN:  No rashes, ulcers or lesions. EXTREMITIES: Chronic lower extremity edema.  No  skin discoloration or tenderness.  No palpable cords. LYMPH NODES: No palpable cervical, supraclavicular, axillary or inguinal adenopathy  NEUROLOGICAL: Unremarkable. PSYCH:  Appropriate.   Hospital Outpatient Visit on 09/01/2016  Component Date Value Ref Range Status  . Glucose-Capillary 09/01/2016 81  65 - 99 mg/dL Final    Assessment:  Brandon Maynard Brooke Bonito. is a 60 y.o. male with schizophrenia, bipolar disorder, and psychotic disorder with at least a 6 year history of anemia and progressive thrombocytopenia.  He has mild leukopenia with a mild monocytosis (10-11%).   He likely has multi-factorial anemia.  He has a distant history of iron deficiency anemia.  He had a colonoscopy 17 year ago (age 58).  Diet appears good (although he notes no fresh vegetables).  He denies any melena or hematochezia.  Ferritin was 82 (normal) on 05/12/2016.    He has chronic kidney disease (CKD) associated with a 30 year history of Lithium use (stoped in the 1990s).  CKD is contributing to his anemia.  Creatinine has been stable between 2.04-2.38 (CrCl 28-31 ml/min).   He is on Depakote which has a 1-27% incidence of thrombocytopenia (dose related).  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  Prozac and Trazodone have case reports of associated anemia.  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  Work-up on 06/16/2016 noted a hematocrit 34.6, hemoglobin 11.9, MCV 94.3, platelets  72,000, white count 4600 with an [place] of 2100. The following were normal:  ANA, hepatitis B surface antigen , hepatitis B core antibody total , hepatitis C antibody, HIV antibody, PTT, PT, iron studies (iron saturation 14% and TIBC of 383), ESR, folate, B12 (326; low normal). Reticulocyte count was 1.5%.  Additional labs on 07/06/2016 revealed a normal SPEP and free light chains.  Peripheral smear was normal.  Bone marrow biopsy on 08/06/2016 revealed a variably cellular marrow (20-50%) with relatively increased erythroid precursors, marginally increased monocytic cells, and mild trilineage dyspoiesis.  There were multiple small granulomas, non-specific.  GMS and AFB stains were negative.  There was slight patchy increase in reticulin.  Storage iron was present.  Flow cytometry was negative.  Cytogenetics were normal (46, XY).  Cytopenias were felt likely multi-factorial and due to chronic disease and drug/medication effect.  PET scan on 09/01/2016 revealed no hypermetabolic source of primary malignancy.  There was mildly hypermetabolic patchy ground-glass in the inferior right lower lobe, likely infectious or inflammatory in etiology.   He has B12 deficiency.  MMA was 445 (high) on 07/06/2016.  He received B12 x 2 (07/12/2016 and 07/19/2016), but stopped secondary to a "reaction".  He is on oral B12 and tolerating well.  Abdominal ultrasound on 06/28/2016 revealed a spleen upper limits of normal (13.2 cm).  He notes that he is not getting enough to eat.  He has gained a pound since last visit.  He has no nausea, vomiting or diarrhea.  TSH was normal on 05/12/2016.  Plan: 1.  Review PET scan. No evidence of malignancy.  Patient denies any fever or respiratory symptoms.  Discuss follow-up CXR at next appointment. 2.  Discuss caloric intake.  Patient requesting samples of Boost (to be picked up today). 3.  CXR (PA and latera) at next visit. 4.  RTC in 2 months for MD assessment, labs (CBC with diff,  ferritin, B12, folate), and review of CXR (PA and lateral).   Lequita Asal, MD  09/02/2016, 11:41 AM

## 2016-09-02 NOTE — Progress Notes (Signed)
Patient is asking for Tylenol 3 for his pain prn.  States he cannot take Nsaids.  Patient is accompanied by caretaker at group home where he lives and he states patient's PCP is [name] who took him off his Tylenol a good while back.  Patient also states he is not getting enough to eat.  Asking for boost.  I called downstairs and requested samples.  He can pick those up on his way out today.

## 2016-09-16 ENCOUNTER — Ambulatory Visit: Payer: Self-pay

## 2016-09-16 ENCOUNTER — Other Ambulatory Visit: Payer: Self-pay | Admitting: Family Medicine

## 2016-09-16 DIAGNOSIS — E039 Hypothyroidism, unspecified: Secondary | ICD-10-CM

## 2016-09-16 DIAGNOSIS — E785 Hyperlipidemia, unspecified: Secondary | ICD-10-CM

## 2016-09-17 ENCOUNTER — Other Ambulatory Visit: Payer: Self-pay | Admitting: Family Medicine

## 2016-09-17 DIAGNOSIS — K219 Gastro-esophageal reflux disease without esophagitis: Secondary | ICD-10-CM

## 2016-10-04 ENCOUNTER — Encounter: Payer: Self-pay | Admitting: Emergency Medicine

## 2016-10-04 ENCOUNTER — Emergency Department: Payer: Medicare Other

## 2016-10-04 ENCOUNTER — Inpatient Hospital Stay
Admission: EM | Admit: 2016-10-04 | Discharge: 2016-10-22 | DRG: 871 | Disposition: A | Discharge status: Home Health | Payer: Medicare Other | Attending: Internal Medicine | Admitting: Internal Medicine

## 2016-10-04 DIAGNOSIS — F319 Bipolar disorder, unspecified: Secondary | ICD-10-CM | POA: Diagnosis present

## 2016-10-04 DIAGNOSIS — R410 Disorientation, unspecified: Secondary | ICD-10-CM | POA: Diagnosis not present

## 2016-10-04 DIAGNOSIS — N170 Acute kidney failure with tubular necrosis: Secondary | ICD-10-CM | POA: Diagnosis present

## 2016-10-04 DIAGNOSIS — Z8249 Family history of ischemic heart disease and other diseases of the circulatory system: Secondary | ICD-10-CM | POA: Diagnosis not present

## 2016-10-04 DIAGNOSIS — F17210 Nicotine dependence, cigarettes, uncomplicated: Secondary | ICD-10-CM | POA: Diagnosis present

## 2016-10-04 DIAGNOSIS — N179 Acute kidney failure, unspecified: Secondary | ICD-10-CM

## 2016-10-04 DIAGNOSIS — D6959 Other secondary thrombocytopenia: Secondary | ICD-10-CM | POA: Diagnosis present

## 2016-10-04 DIAGNOSIS — F200 Paranoid schizophrenia: Secondary | ICD-10-CM | POA: Diagnosis present

## 2016-10-04 DIAGNOSIS — M79606 Pain in leg, unspecified: Secondary | ICD-10-CM

## 2016-10-04 DIAGNOSIS — E785 Hyperlipidemia, unspecified: Secondary | ICD-10-CM | POA: Diagnosis present

## 2016-10-04 DIAGNOSIS — G4700 Insomnia, unspecified: Secondary | ICD-10-CM | POA: Diagnosis present

## 2016-10-04 DIAGNOSIS — D72829 Elevated white blood cell count, unspecified: Secondary | ICD-10-CM | POA: Diagnosis not present

## 2016-10-04 DIAGNOSIS — Z833 Family history of diabetes mellitus: Secondary | ICD-10-CM | POA: Diagnosis not present

## 2016-10-04 DIAGNOSIS — E872 Acidosis: Secondary | ICD-10-CM | POA: Diagnosis present

## 2016-10-04 DIAGNOSIS — D638 Anemia in other chronic diseases classified elsewhere: Secondary | ICD-10-CM | POA: Diagnosis present

## 2016-10-04 DIAGNOSIS — E538 Deficiency of other specified B group vitamins: Secondary | ICD-10-CM | POA: Diagnosis present

## 2016-10-04 DIAGNOSIS — A4151 Sepsis due to Escherichia coli [E. coli]: Secondary | ICD-10-CM | POA: Diagnosis present

## 2016-10-04 DIAGNOSIS — E871 Hypo-osmolality and hyponatremia: Secondary | ICD-10-CM | POA: Diagnosis present

## 2016-10-04 DIAGNOSIS — R262 Difficulty in walking, not elsewhere classified: Secondary | ICD-10-CM

## 2016-10-04 DIAGNOSIS — N189 Chronic kidney disease, unspecified: Secondary | ICD-10-CM | POA: Diagnosis not present

## 2016-10-04 DIAGNOSIS — I120 Hypertensive chronic kidney disease with stage 5 chronic kidney disease or end stage renal disease: Secondary | ICD-10-CM | POA: Diagnosis present

## 2016-10-04 DIAGNOSIS — F259 Schizoaffective disorder, unspecified: Secondary | ICD-10-CM | POA: Diagnosis present

## 2016-10-04 DIAGNOSIS — A4189 Other specified sepsis: Secondary | ICD-10-CM | POA: Diagnosis not present

## 2016-10-04 DIAGNOSIS — N186 End stage renal disease: Secondary | ICD-10-CM | POA: Diagnosis present

## 2016-10-04 DIAGNOSIS — L03116 Cellulitis of left lower limb: Secondary | ICD-10-CM | POA: Diagnosis present

## 2016-10-04 DIAGNOSIS — Z8614 Personal history of Methicillin resistant Staphylococcus aureus infection: Secondary | ICD-10-CM | POA: Diagnosis not present

## 2016-10-04 DIAGNOSIS — K210 Gastro-esophageal reflux disease with esophagitis: Secondary | ICD-10-CM | POA: Diagnosis present

## 2016-10-04 DIAGNOSIS — R509 Fever, unspecified: Secondary | ICD-10-CM | POA: Diagnosis present

## 2016-10-04 DIAGNOSIS — Z808 Family history of malignant neoplasm of other organs or systems: Secondary | ICD-10-CM | POA: Diagnosis not present

## 2016-10-04 DIAGNOSIS — G9340 Encephalopathy, unspecified: Secondary | ICD-10-CM | POA: Diagnosis present

## 2016-10-04 DIAGNOSIS — Z803 Family history of malignant neoplasm of breast: Secondary | ICD-10-CM | POA: Diagnosis not present

## 2016-10-04 DIAGNOSIS — T4395XA Adverse effect of unspecified psychotropic drug, initial encounter: Secondary | ICD-10-CM | POA: Diagnosis present

## 2016-10-04 DIAGNOSIS — M79605 Pain in left leg: Secondary | ICD-10-CM | POA: Diagnosis not present

## 2016-10-04 DIAGNOSIS — E875 Hyperkalemia: Secondary | ICD-10-CM | POA: Diagnosis present

## 2016-10-04 DIAGNOSIS — R6520 Severe sepsis without septic shock: Secondary | ICD-10-CM | POA: Diagnosis not present

## 2016-10-04 DIAGNOSIS — D509 Iron deficiency anemia, unspecified: Secondary | ICD-10-CM | POA: Diagnosis present

## 2016-10-04 DIAGNOSIS — Z79899 Other long term (current) drug therapy: Secondary | ICD-10-CM

## 2016-10-04 DIAGNOSIS — D696 Thrombocytopenia, unspecified: Secondary | ICD-10-CM | POA: Diagnosis present

## 2016-10-04 DIAGNOSIS — D693 Immune thrombocytopenic purpura: Secondary | ICD-10-CM | POA: Diagnosis not present

## 2016-10-04 DIAGNOSIS — Z95828 Presence of other vascular implants and grafts: Secondary | ICD-10-CM

## 2016-10-04 HISTORY — DX: Malignant (primary) neoplasm, unspecified: C80.1

## 2016-10-04 LAB — CBC WITH DIFFERENTIAL/PLATELET
BASOS PCT: 0 %
Band Neutrophils: 10 %
Basophils Absolute: 0 10*3/uL (ref 0–0.1)
Blasts: 0 %
EOS PCT: 0 %
Eosinophils Absolute: 0 10*3/uL (ref 0–0.7)
HCT: 34 % — ABNORMAL LOW (ref 40.0–52.0)
Hemoglobin: 11.4 g/dL — ABNORMAL LOW (ref 13.0–18.0)
LYMPHS ABS: 0.5 10*3/uL — AB (ref 1.0–3.6)
LYMPHS PCT: 4 %
MCH: 31.1 pg (ref 26.0–34.0)
MCHC: 33.7 g/dL (ref 32.0–36.0)
MCV: 92.3 fL (ref 80.0–100.0)
MONO ABS: 0.8 10*3/uL (ref 0.2–1.0)
MONOS PCT: 6 %
MYELOCYTES: 3 %
Metamyelocytes Relative: 2 %
NEUTROS ABS: 11.4 10*3/uL — AB (ref 1.4–6.5)
NEUTROS PCT: 75 %
NRBC: 0 /100{WBCs}
OTHER: 0 %
PLATELETS: 42 10*3/uL — AB (ref 150–440)
Promyelocytes Absolute: 0 %
RBC: 3.68 MIL/uL — AB (ref 4.40–5.90)
RDW: 14.5 % (ref 11.5–14.5)
WBC: 12.7 10*3/uL — ABNORMAL HIGH (ref 3.8–10.6)

## 2016-10-04 LAB — URINALYSIS COMPLETE WITH MICROSCOPIC (ARMC ONLY)
BILIRUBIN URINE: NEGATIVE
Glucose, UA: NEGATIVE mg/dL
Ketones, ur: NEGATIVE mg/dL
Nitrite: NEGATIVE
PH: 6 (ref 5.0–8.0)
PROTEIN: 100 mg/dL — AB
Specific Gravity, Urine: 1.005 (ref 1.005–1.030)

## 2016-10-04 LAB — COMPREHENSIVE METABOLIC PANEL
ALT: 22 U/L (ref 17–63)
AST: 53 U/L — ABNORMAL HIGH (ref 15–41)
Albumin: 2.6 g/dL — ABNORMAL LOW (ref 3.5–5.0)
Alkaline Phosphatase: 70 U/L (ref 38–126)
Anion gap: 14 (ref 5–15)
BUN: 85 mg/dL — ABNORMAL HIGH (ref 6–20)
CHLORIDE: 94 mmol/L — AB (ref 101–111)
CO2: 19 mmol/L — AB (ref 22–32)
CREATININE: 4.33 mg/dL — AB (ref 0.61–1.24)
Calcium: 8.9 mg/dL (ref 8.9–10.3)
GFR calc non Af Amer: 13 mL/min — ABNORMAL LOW (ref >60)
GFR, EST AFRICAN AMERICAN: 16 mL/min — AB (ref >60)
Glucose, Bld: 99 mg/dL (ref 65–99)
POTASSIUM: 4.4 mmol/L (ref 3.5–5.1)
SODIUM: 127 mmol/L — AB (ref 135–145)
Total Bilirubin: 0.8 mg/dL (ref 0.3–1.2)
Total Protein: 6.8 g/dL (ref 6.5–8.1)

## 2016-10-04 LAB — MRSA PCR SCREENING: MRSA BY PCR: NEGATIVE

## 2016-10-04 LAB — LACTIC ACID, PLASMA
LACTIC ACID, VENOUS: 2.6 mmol/L — AB (ref 0.5–1.9)
LACTIC ACID, VENOUS: 1.7 mmol/L (ref 0.5–1.9)

## 2016-10-04 LAB — TYPE AND SCREEN
ABO/RH(D): O POS
Antibody Screen: NEGATIVE

## 2016-10-04 LAB — VALPROIC ACID LEVEL: Valproic Acid Lvl: 53 ug/mL (ref 50.0–100.0)

## 2016-10-04 MED ORDER — BRIMONIDINE TARTRATE-TIMOLOL 0.2-0.5 % OP SOLN: 1.0000 [drp] | Freq: Two times a day (BID) | OPHTHALMIC | Status: DC

## 2016-10-04 MED ORDER — VANCOMYCIN HCL IN DEXTROSE 750-5 MG/150ML-% IV SOLN
750.0000 mg | Freq: Once | INTRAVENOUS | Status: AC
  Administered 2016-10-04: 21:00:00 750 mg via INTRAVENOUS
  Filled 2016-10-04: qty 150

## 2016-10-04 MED ORDER — HYDROMORPHONE HCL 1 MG/ML IJ SOLN
1.0000 mg | Freq: Once | INTRAMUSCULAR | Status: AC
  Administered 2016-10-04: 1 mg via INTRAVENOUS
  Filled 2016-10-04: qty 1

## 2016-10-04 MED ORDER — ONDANSETRON HCL 4 MG/2ML IJ SOLN
4.0000 mg | Freq: Once | INTRAMUSCULAR | Status: AC
  Administered 2016-10-04: 4 mg via INTRAVENOUS
  Filled 2016-10-04: qty 2

## 2016-10-04 MED ORDER — SIMVASTATIN 20 MG PO TABS
20.0000 mg | ORAL_TABLET | Freq: Every day | ORAL | Status: DC
  Administered 2016-10-04 – 2016-10-21 (×18): 20 mg via ORAL
  Filled 2016-10-04 (×18): qty 1

## 2016-10-04 MED ORDER — ONDANSETRON HCL 4 MG PO TABS: 4.0000 mg | ORAL_TABLET | Freq: Four times a day (QID) | ORAL | Status: DC | PRN

## 2016-10-04 MED ORDER — ADULT MULTIVITAMIN W/MINERALS CH
1.0000 | ORAL_TABLET | Freq: Every day | ORAL | Status: DC
  Administered 2016-10-04 – 2016-10-22 (×19): 1 via ORAL
  Filled 2016-10-04 (×19): qty 1

## 2016-10-04 MED ORDER — SODIUM CHLORIDE 0.9 % IV SOLN
1000.0000 mL | INTRAVENOUS | Status: DC
  Administered 2016-10-04 (×2): 1000 mL via INTRAVENOUS

## 2016-10-04 MED ORDER — MORPHINE SULFATE (PF) 2 MG/ML IV SOLN: 2.0000 mg | INTRAVENOUS | Status: DC | PRN

## 2016-10-04 MED ORDER — OXYCODONE HCL 5 MG PO TABS
5.0000 mg | ORAL_TABLET | Freq: Four times a day (QID) | ORAL | Status: DC | PRN
  Administered 2016-10-04: 5 mg via ORAL
  Filled 2016-10-04: qty 1

## 2016-10-04 MED ORDER — LEVOTHYROXINE SODIUM 50 MCG PO TABS
50.0000 ug | ORAL_TABLET | Freq: Every day | ORAL | Status: DC
  Administered 2016-10-04 – 2016-10-22 (×18): 50 ug via ORAL
  Filled 2016-10-04 (×18): qty 1

## 2016-10-04 MED ORDER — TIMOLOL MALEATE 0.5 % OP SOLN
1.0000 [drp] | Freq: Two times a day (BID) | OPHTHALMIC | Status: DC
  Administered 2016-10-04 – 2016-10-22 (×35): 1 [drp] via OPHTHALMIC
  Filled 2016-10-04 (×2): qty 5

## 2016-10-04 MED ORDER — ONDANSETRON HCL 4 MG/2ML IJ SOLN
4.0000 mg | Freq: Four times a day (QID) | INTRAMUSCULAR | Status: DC | PRN
  Administered 2016-10-14: 4 mg via INTRAVENOUS

## 2016-10-04 MED ORDER — BRIMONIDINE TARTRATE 0.2 % OP SOLN
1.0000 [drp] | Freq: Two times a day (BID) | OPHTHALMIC | Status: DC
  Administered 2016-10-04 – 2016-10-22 (×34): 1 [drp] via OPHTHALMIC
  Filled 2016-10-04 (×2): qty 5

## 2016-10-04 MED ORDER — VITAMIN B-6 50 MG PO TABS
100.0000 mg | ORAL_TABLET | Freq: Two times a day (BID) | ORAL | Status: DC
  Administered 2016-10-04 – 2016-10-22 (×35): 100 mg via ORAL
  Filled 2016-10-04 (×6): qty 2
  Filled 2016-10-04: qty 1
  Filled 2016-10-04 (×34): qty 2

## 2016-10-04 MED ORDER — PIPERACILLIN-TAZOBACTAM 3.375 G IVPB 30 MIN
3.3750 g | Freq: Once | INTRAVENOUS | Status: AC
  Administered 2016-10-04: 3.375 g via INTRAVENOUS
  Filled 2016-10-04: qty 50

## 2016-10-04 MED ORDER — VANCOMYCIN HCL IN DEXTROSE 1-5 GM/200ML-% IV SOLN
1000.0000 mg | Freq: Once | INTRAVENOUS | Status: AC
  Administered 2016-10-04: 1000 mg via INTRAVENOUS
  Filled 2016-10-04: qty 200

## 2016-10-04 MED ORDER — DOCUSATE SODIUM 100 MG PO CAPS
100.0000 mg | ORAL_CAPSULE | Freq: Two times a day (BID) | ORAL | Status: DC
  Administered 2016-10-04 – 2016-10-22 (×34): 100 mg via ORAL
  Filled 2016-10-04 (×35): qty 1

## 2016-10-04 MED ORDER — LATANOPROST 0.005 % OP SOLN
1.0000 [drp] | Freq: Every day | OPHTHALMIC | Status: DC
  Administered 2016-10-04 – 2016-10-21 (×18): 1 [drp] via OPHTHALMIC
  Filled 2016-10-04: qty 2.5

## 2016-10-04 MED ORDER — PIPERACILLIN-TAZOBACTAM 3.375 G IVPB 30 MIN: 3.3750 g | Freq: Three times a day (TID) | INTRAVENOUS | Status: DC

## 2016-10-04 MED ORDER — TRAZODONE HCL 100 MG PO TABS
100.0000 mg | ORAL_TABLET | Freq: Every day | ORAL | Status: DC
  Administered 2016-10-04 – 2016-10-21 (×18): 100 mg via ORAL
  Filled 2016-10-04 (×11): qty 1
  Filled 2016-10-04: qty 2
  Filled 2016-10-04 (×6): qty 1

## 2016-10-04 MED ORDER — FLUOXETINE HCL 20 MG PO CAPS
40.0000 mg | ORAL_CAPSULE | Freq: Every day | ORAL | Status: DC
  Administered 2016-10-04 – 2016-10-22 (×19): 40 mg via ORAL
  Filled 2016-10-04 (×19): qty 2

## 2016-10-04 MED ORDER — PIPERACILLIN-TAZOBACTAM 3.375 G IVPB
3.3750 g | Freq: Three times a day (TID) | INTRAVENOUS | Status: DC
  Administered 2016-10-04: 22:00:00 3.375 g via INTRAVENOUS
  Filled 2016-10-04: qty 50

## 2016-10-04 NOTE — ED Notes (Signed)
Patient transported to Ultrasound 

## 2016-10-04 NOTE — ED Provider Notes (Signed)
Time Seen: Approximately 1533  I have reviewed the triage notes  Chief Complaint: Foot Pain   History of Present Illness: Brandon Maynard. is a 60 y.o. male who presents with significant left lower extremity redness and swelling with a large blister on the anterior surface of his left foot. According to the group home staff they did not see any redness or swelling the evening before when they got the patient dressed. Patient had complained of some pain earlier per the staff and asked when they noticed the swelling and redness. Patient describes diffuse pain across his lower extremity and has a history of schizoaffective disease and psychotic disorder along with a history of anemia and thrombocytopenia.   Past Medical History:  Diagnosis Date  . Anemia   . Bipolar disorder ([place])   . Cancer ([place])   . Cataract    right eye  . DDD (degenerative disc disease)   . GERD (gastroesophageal reflux disease)   . Heart murmur   . Hyperlipidemia   . Hypertension   . Paranoid schizophrenia ([place])   . 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 Thyroid disease     Patient Active Problem List   Diagnosis Date Noted  . Left leg cellulitis 10/04/2016  . B12 deficiency 07/06/2016  . Anemia 06/16/2016  . Thrombocytopenia ([place]) 06/16/2016  . Weight loss 06/16/2016  . Esophageal reflux 05/12/2016  . DDD (degenerative disc disease), 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  . 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 Schizoaffective disorder ([place]) 06/13/2014    Past Surgical History:  Procedure Laterality Date  . Dermal Abrasion  1973    Past Surgical History:  Procedure Laterality Date  . Dermal Abrasion  1973    Current Outpatient Rx  . Order #: [id number] Class: Historical Med  . Order #: [id number] Class: No Print  . Order #: [id number] Class: Historical Med  . Order #: [id number] Class: Print  . Order #: [id number] Class: Normal  . Order #: [id number] Class: Historical Med  . Order #: [id number] Class: Historical Med  . Order  #: [id number] Class: Historical Med  . Order #: [id number] Class: Historical Med  . Order #: [id number] Class: Historical Med  . Order #: [id number] Class: Normal  . Order #: [id number] Class: Historical Med  . Order #: [id number] Class: Normal  . Order #: [id number] Class: Normal  . Order #: [id number] Class: Normal  . Order #: [id number] Class: Normal  . Order #: [id number] Class: No Print  . Order #: [id number] Class: Normal  . Order #: [id number] Class: Normal  . Order #: [id number] Class: No Print    Allergies:  Tetracyclines & related  Family History: Family History  Problem Relation Age of Onset  . Brain cancer Maternal Uncle   . Breast cancer Mother   . Melanoma Mother   . Congestive Heart Failure Father   . Melanoma Father   . Breast cancer Maternal Aunt   . Diabetes Maternal Aunt     Social History: Social History  Substance Use Topics  . Smoking status: Current Every Day Smoker    Last attempt to quit: 04/09/1991  . Smokeless tobacco: Never Used  . Alcohol use No     Review of Systems:   10 point review of systems was performed and was otherwise negative:  Constitutional: No fever Eyes: No visual disturbances ENT: No sore throat, ear pain Cardiac: No chest pain Respiratory: No shortness of breath, wheezing, or stridor Abdomen: No abdominal pain, no vomiting, No diarrhea Endocrine: No weight loss, No night sweats Extremities: Swelling and redness is exclusively over the left lower extremity Skin:  No rashes, easy bruising Neurologic: No focal weakness, trouble with speech or swollowing Urologic: No dysuria, Hematuria, or urinary frequency   Physical Exam:  ED Triage Vitals  Enc Vitals Group     BP 10/04/16 1459 (!) 102/50     Pulse Rate 10/04/16 1459 71     Resp 10/04/16 1459 18     Temp 10/04/16 1459 98.3 F (36.8 C)     Temp Source 10/04/16 1459 Oral     SpO2 10/04/16 1459 100 %     Weight 10/04/16 1500 185 lb (83.9 kg)     Height 10/04/16 1500 6\' 3"  (1.905 m)      Head Circumference --      Peak Flow --      Pain Score 10/04/16 1500 0     Pain Loc --      Pain Edu? --      Excl. in [place]? --     General: Awake , Alert , and Oriented times 3; GCS 15 Head: Normal cephalic , atraumatic Eyes: Pupils equal , round, reactive to light. Pale conjunctiva Nose/Throat: No nasal drainage, patent upper airway without erythema or exudate.  Neck: Supple, Full range of motion, No anterior adenopathy or palpable thyroid masses. No meningeal signs Lungs: Clear to ascultation without wheezes , rhonchi, or rales Heart: Regular rate, regular rhythm without murmurs , gallops , or rubs Abdomen: Soft, non tender without rebound, guarding , or rigidity; bowel sounds positive and symmetric in all 4 quadrants. No organomegaly .        Extremities: Patient has unilateral extensive circumferential swelling with a approximately 7 cm blister across the anterior surface of his left foot with no obvious puncture injuries or acute trauma. Redness extends again circumferentially to the level of the knee. Neurologic: normal ambulation, Motor symmetric without deficits, sensory intact Skin: warm, dry, no rashes   Labs:   All laboratory work was reviewed including any pertinent negatives or positives listed below:  Labs Reviewed  COMPREHENSIVE METABOLIC PANEL - Abnormal; Notable for the following:       Result Value   Sodium 127 (*)    Chloride 94 (*)    CO2 19 (*)    BUN 85 (*)    Creatinine, Ser 4.33 (*)    Albumin 2.6 (*)    AST 53 (*)    GFR calc non Af Amer 13 (*)    GFR calc Af Amer 16 (*)    All other components within normal limits  CBC WITH DIFFERENTIAL/PLATELET - Abnormal; Notable for the following:    WBC 12.7 (*)    RBC 3.68 (*)    Hemoglobin 11.4 (*)    HCT 34.0 (*)    Platelets 42 (*)    Neutro Abs 11.4 (*)    Lymphs Abs 0.5 (*)    All other components within normal limits  LACTIC ACID, PLASMA - Abnormal; Notable for the following:    Lactic Acid, Venous  2.6 (*)    All other components within normal limits  CULTURE, BLOOD (ROUTINE X 2)  CULTURE, BLOOD (ROUTINE X 2)  URINE CULTURE  LACTIC ACID, PLASMA  URINALYSIS COMPLETEWITH MICROSCOPIC ([facility] ONLY)  VALPROIC ACID LEVEL  TYPE AND SCREEN  Findings indicative of sepsis with an elevated lactic acid was blood cell count  EKG: * ED ECG REPORT I, [name], the attending physician, personally viewed and interpreted this ECG.  Date: 10/04/2016 EKG Time: 1602 Rate:76 Rhythm: normal sinus rhythm QRS Axis:  normal Intervals: Right bundle-branch block pattern ST/T Wave abnormalities: normal Conduction Disturbances: none Narrative Interpretation: unremarkable No acute ischemic changes   Radiology:  "US Venous Img Lower Unilateral Left  Result Date: 10/04/2016 CLINICAL DATA:  Left leg swelling for 1 day. Redness and blistering. EXAM: LEFT LOWER EXTREMITY VENOUS DOPPLER ULTRASOUND TECHNIQUE: Gray-scale sonography with graded compression, as well as color Doppler and duplex ultrasound, were performed to evaluate the deep venous system from the level of the common femoral vein through the popliteal and proximal calf veins. Spectral Doppler was utilized to evaluate flow at rest and with distal augmentation maneuvers. COMPARISON:  05/05/2011 FINDINGS: Right common femoral vein is patent without thrombus. Normal compressibility, augmentation and color Doppler flow in the left common femoral vein, left femoral vein and left popliteal vein. The left saphenofemoral junction is patent. Left profunda femoral vein is patent without thrombus. Subcutaneous edema in the left calf. Visualized left deep calf veins are patent without thrombus. IMPRESSION: Negative for deep venous thrombosis in left lower extremity. Electronically Signed   By: Markus Daft M.D.   On: 10/04/2016 16:50  "  I personally reviewed the radiologic studies    Critical Care: CRITICAL CARE Performed by: [name]   Total  critical care time: 35 minutes  Critical care time was exclusive of separately billable procedures and treating other patients.  Critical care was necessary to treat or prevent imminent or life-threatening deterioration.  Critical care was time spent personally by me on the following activities: development of treatment plan with patient and/or surrogate as well as nursing, discussions with consultants, evaluation of patient's response to treatment, examination of patient, obtaining history from patient or surrogate, ordering and performing treatments and interventions, ordering and review of laboratory studies, ordering and review of radiographic studies, pulse oximetry and re-evaluation of patient's condition. Patient was initiated on code sepsis order set. We wished to be careful with his IV fluids due to his renal function, etc.    ED Course: * Patient had blood cultures obtained. He was initiated on IV antibiotics for left her evaluation. His case was reviewed with the hospitalist team, further disposition and management depends upon their evaluation with likely continuation of IV vancomycin and Zosyn. Clinical Course      Assessment:  Extensive left-sided lower extremity cellulitis. Possible MRSA   Final Clinical Impression: * Final diagnoses:  Cellulitis of left lower extremity     Plan:  Inpatient management            [name], MD 10/04/16 1907

## 2016-10-04 NOTE — ED Notes (Signed)
Code  Sepsis  Called to   [place]

## 2016-10-04 NOTE — ED Triage Notes (Signed)
Patient presents to the ED via POV from a group home with group home staff.  Patient has a large swollen fluid filled area to the top of his left foot.  Patient denies pain to foot at this time but group home staff states he was complaining of pain earlier.  Patient is in no obvious distress at this time.

## 2016-10-04 NOTE — Progress Notes (Addendum)
Pharmacy Antibiotic Note  Brandon Maynard. is a 60 y.o. male admitted on 10/04/2016 with cellulitis.  Pharmacy has been consulted for vancomycin and piperacillin/tazobactam dosing.  Plan: Piperacillin/tazobactam 3.375 g IV q8h EI  Vancomycin 1000 mg given in ED. Will give another 750 mg dose now for a total dose of 1750 mg (20 mg/kg) and will dose off of random vancomycin levels given renal function (SCr 4.3, not on dialysis).  Calculated half-life of 32 hours. Will check 36 hour level. Re-dose if level <15 mcg/mL. Goal vancomycin trough 10-15 mcg/mL.  Ordered SCr with AM labs tomorrow - follow renal function closely  Height: 6\' 3"  (190.5 cm) Weight: 185 lb (83.9 kg) IBW/kg (Calculated) : 84.5  Temp (24hrs), Avg:98.3 F (36.8 C), Min:98.3 F (36.8 C), Max:98.3 F (36.8 C)   Recent Labs Lab 10/04/16 1551 10/04/16 1552  WBC 12.7*  --   CREATININE 4.33*  --   LATICACIDVEN  --  2.6*    Estimated Creatinine Clearance: 21 mL/min (by C-G formula based on SCr of 4.33 mg/dL (H)).    Allergies  Allergen Reactions  . Tetracyclines & Related Hives   Antimicrobials this admission: vancomycin 11/6 >>  Piperacillin/tazobactam 11/6 >>   Dose adjustments this admission:  Microbiology results: 11/6 BCx: Sent 11/6 UCx: Sent   Thank you for allowing pharmacy to be a part of this patient's care.  Lenis Noon, PharmD Clinical Pharmacist 10/04/2016 7:24 PM

## 2016-10-04 NOTE — ED Notes (Signed)
Dr. Marcelene Butte notified of critical lactic acid

## 2016-10-04 NOTE — ED Notes (Signed)
Admitting MD at bedside.

## 2016-10-04 NOTE — H&P (Signed)
[place] [facility] [place] NAME: Brandon Maynard    MR#:  [id number]  DATE OF BIRTH:  [birth year]  DATE OF ADMISSION:  10/04/2016  PRIMARY CARE PHYSICIAN: [name], MD   REQUESTING/REFERRING PHYSICIAN: [name], MD  CHIEF COMPLAINT:  Left leg pain and redness  HISTORY OF PRESENT ILLNESS:  Brandon Maynard  is a 60 y.o. male with a known history of Bipolar disorder, cataract, chronic thrombocytopenia is presenting to the ED with a chief complaint of significant worsening of left lower extremity pain, redness and swelling which started with a small blister on the left foot. Denies any insect bites.Denies similar complains in the past. DVT ruled out in the emergency department. Patient is started on 2 IV antibiotics  PAST MEDICAL HISTORY:   Past Medical History:  Diagnosis Date  . Anemia   . Bipolar disorder ([place])   . Cancer ([place])   . Cataract    right eye  . DDD (degenerative disc disease)   . GERD (gastroesophageal reflux disease)   . Heart murmur   . Hyperlipidemia   . Hypertension   . Paranoid schizophrenia ([place])   . 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 Thyroid disease     PAST SURGICAL HISTOIRY:   Past Surgical History:  Procedure Laterality Date  . Dermal Abrasion  1973    SOCIAL HISTORY:   Social History  Substance Use Topics  . Smoking status: Current Every Day Smoker    Last attempt to quit: 04/09/1991  . Smokeless tobacco: Never Used  . Alcohol use No    FAMILY HISTORY:   Family History  Problem Relation Age of Onset  . Brain cancer Maternal Uncle   . Breast cancer Mother   . Melanoma Mother   . Congestive Heart Failure Father   . Melanoma Father   . Breast cancer Maternal Aunt   . Diabetes Maternal Aunt     DRUG ALLERGIES:   Allergies  Allergen Reactions  . Tetracyclines & Related Hives    REVIEW OF SYSTEMS:  CONSTITUTIONAL: No fever, fatigue or weakness.  EYES: No blurred or double vision.  EARS,  NOSE, AND THROAT: No tinnitus or ear pain.  RESPIRATORY: No cough, shortness of breath, wheezing or hemoptysis.  CARDIOVASCULAR: No chest pain, orthopnea, edema.  GASTROINTESTINAL: No nausea, vomiting, diarrhea or abdominal pain.  GENITOURINARY: No dysuria, hematuria.  ENDOCRINE: No polyuria, nocturia,  HEMATOLOGY: No anemia, easy bruising or bleeding SKIN: No rash or lesion. MUSCULOSKELETAL:Left lower leg is red, painful with blisters  NEUROLOGIC: No tingling, numbness, weakness.  PSYCHIATRY: No anxiety or depression.   MEDICATIONS AT HOME:   Prior to Admission medications   Medication Sig Start Date End Date Taking? Authorizing Provider  asenapine (SAPHRIS) 5 MG SUBL 24 hr tablet Place 5-10 mg under the tongue 2 (two) times daily. Take 5mg  daily in morning and take 10mg  daily at bedtime   Yes Historical Provider, MD  bimatoprost (LUMIGAN) 0.01 % SOLN Place 1 drop into both eyes at bedtime. 06/27/14  Yes Niel Hummer, NP  brimonidine-timolol (COMBIGAN) 0.2-0.5 % ophthalmic solution Place 1 drop into both eyes every 12 (twelve) hours.   Yes Historical Provider, MD  clonazePAM (KLONOPIN) 1 MG tablet Take 1 tablet (1 mg total) by mouth at bedtime. 06/27/14  Yes Niel Hummer, NP  divalproex (DEPAKOTE ER) 500 MG 24 hr tablet Take 2 tablets (1,000 mg total) by mouth at bedtime. Patient taking differently: Take 500 mg by mouth  at bedtime. Take one tablet by mouth in am and two tabs by mouth in pm 06/27/14  Yes Niel Hummer, NP  FLUoxetine (PROZAC) 20 MG capsule Take 40 mg by mouth daily.   Yes Historical Provider, MD  labetalol (NORMODYNE) 100 MG tablet Take 100 mg by mouth 2 (two) times daily.   Yes Historical Provider, MD  levothyroxine (SYNTHROID, LEVOTHROID) 50 MCG tablet Take 50 mcg by mouth daily before breakfast.   Yes Historical Provider, MD  Multiple Vitamin (MULTIVITAMIN WITH MINERALS) TABS tablet Take 1 tablet by mouth daily.   Yes Historical Provider, MD  pyridOXINE (VITAMIN B-6) 50  MG tablet Take 100 mg by mouth 2 (two) times daily.   Yes Historical Provider, MD  simvastatin (ZOCOR) 20 MG tablet TAKE 1 TABLET BY MOUTH EVERY NIGHT AT BEDTIME 09/17/16  Yes Juline Patch, MD  traZODone (DESYREL) 100 MG tablet Take 100 mg by mouth at bedtime.   Yes Historical Provider, MD  asenapine (SAPHRIS) 5 MG SUBL 24 hr tablet Place 2 tablets (10 mg total) under the tongue at bedtime. Patient not taking: Reported on 10/04/2016 06/27/14   Niel Hummer, NP  esomeprazole (NEXIUM) 40 MG capsule TAKE 1 CAPSULE BY MOUTH DAILY Patient not taking: Reported on 10/04/2016 09/20/16   Juline Patch, MD  FLUoxetine (PROZAC) 20 MG tablet Take 2 tablets (40 mg total) by mouth daily. Patient not taking: Reported on 10/04/2016 06/27/14   Niel Hummer, NP  labetalol (NORMODYNE) 100 MG tablet TAKE 1 TABLET BY MOUTH TWICE A DAY Patient not taking: Reported on 10/04/2016 09/17/16   Juline Patch, MD  labetalol (NORMODYNE) 200 MG tablet Take 0.5 tablets (100 mg total) by mouth 2 (two) times daily. Patient not taking: Reported on 10/04/2016 05/12/16   Juline Patch, MD  levothyroxine (SYNTHROID, LEVOTHROID) 50 MCG tablet TAKE 1 TABLET BY MOUTH EVERY MORNING 09/17/16   Juline Patch, MD  Multiple Vitamins-Minerals (MULTIVITAMIN WITH MINERALS) tablet TAKE 1 TABLET BY MOUTH DAILY Patient not taking: Reported on 10/04/2016 09/17/16   Juline Patch, MD  traZODone (DESYREL) 100 MG tablet Take 0.5 tablets (50 mg total) by mouth at bedtime. Patient taking differently: Take 100 mg by mouth at bedtime.  06/27/14   Niel Hummer, NP      VITAL SIGNS:  Blood pressure 94/65, pulse 77, temperature 98.3 F (36.8 C), temperature source Oral, resp. rate 16, height 6\' 3"  (1.905 m), weight 83.9 kg (185 lb), SpO2 100 %.  PHYSICAL EXAMINATION:  GENERAL:  60 y.o.-year-old patient lying in the bed with no acute distress.  EYES: Pupils equal, round, reactive to light and accommodation. No scleral icterus. Extraocular muscles  intact.  HEENT: Head atraumatic, normocephalic. Oropharynx and nasopharynx clear.  NECK:  Supple, no jugular venous distention. No thyroid enlargement, no tenderness.  LUNGS: Normal breath sounds bilaterally, no wheezing, rales,rhonchi or crepitation. No use of accessory muscles of respiration.  CARDIOVASCULAR: S1, S2 normal. No murmurs, rubs, or gallops.  ABDOMEN: Soft, nontender, nondistended. Bowel sounds present. No organomegaly or mass.  EXTREMITIES: :Left lower extremity is beefy red, tender with small blisters on the lower extremity and large 10 x 15 cm blister on the dorsal aspect of the footNo pedal edema, cyanosis, or clubbing.  NEUROLOGIC: Cranial nerves II through XII are intact. Muscle strength 5/5 in all extremities. Sensation intact. Gait not checked.  PSYCHIATRIC: The patient is alert and oriented x 3.  SKIN: No obvious rash, lesion, or ulcer.  LABORATORY PANEL:   CBC  Recent Labs Lab 10/04/16 1551  WBC 12.7*  HGB 11.4*  HCT 34.0*  PLT 42*   ------------------------------------------------------------------------------------------------------------------  Chemistries   Recent Labs Lab 10/04/16 1551  NA 127*  K 4.4  CL 94*  CO2 19*  GLUCOSE 99  BUN 85*  CREATININE 4.33*  CALCIUM 8.9  AST 53*  ALT 22  ALKPHOS 70  BILITOT 0.8   ------------------------------------------------------------------------------------------------------------------  Cardiac Enzymes No results for input(s): TROPONINI in the last 168 hours. ------------------------------------------------------------------------------------------------------------------  RADIOLOGY:  US Venous Img Lower Unilateral Left  Result Date: 10/04/2016 CLINICAL DATA:  Left leg swelling for 1 day. Redness and blistering. EXAM: LEFT LOWER EXTREMITY VENOUS DOPPLER ULTRASOUND TECHNIQUE: Gray-scale sonography with graded compression, as well as color Doppler and duplex ultrasound, were performed to evaluate  the deep venous system from the level of the common femoral vein through the popliteal and proximal calf veins. Spectral Doppler was utilized to evaluate flow at rest and with distal augmentation maneuvers. COMPARISON:  05/05/2011 FINDINGS: Right common femoral vein is patent without thrombus. Normal compressibility, augmentation and color Doppler flow in the left common femoral vein, left femoral vein and left popliteal vein. The left saphenofemoral junction is patent. Left profunda femoral vein is patent without thrombus. Subcutaneous edema in the left calf. Visualized left deep calf veins are patent without thrombus. IMPRESSION: Negative for deep venous thrombosis in left lower extremity. Electronically Signed   By: Markus Daft M.D.   On: 10/04/2016 16:50    EKG:   Orders placed or performed during the hospital encounter of 10/04/16  . ED EKG 12-Lead  . ED EKG 12-Lead  . EKG 12-Lead  . EKG 12-Lead    IMPRESSION AND PLAN:   Jimm Salk  is a 60 y.o. male with a known history of Bipolar disorder, cataract, chronic thrombocytopenia is presenting to the ED with a chief complaint of significant worsening of left lower extremity pain, redness and swelling which started with a small blister on the left foot. Denies any insect bites  #Sepsis meets criteria with leukocytosis, elevated lactic acid and hypotension. Secondary to left leg cellulitis Admit to MedSurg unit IV Zosyn and vancomycin Will consider ID consult if no improvement in the next 24-48 hours Pain management Keep legs elevated  #GERD-Pepcid  #Hyponatremia-probably chronic from adverse effects of the psych medications Provided gentle hydration with IV fluids. Mentating fine. Repeat a.m. labs. Psych consult is placed  #Thrombocytopenia significant platelet count is 42,000-probably from the psych medication-induced thrombocytopenia Check valproate levels Holding valproate and Saphris for now Psych consult is placed for possible  psych medication adjustment given significant trauma cytopenia  DVT prophylaxis with SCDs  All the records are reviewed and case discussed with ED provider. Management plans discussed with the patient, patient's brother in [place] and they are in agreement.  CODE STATUS: fc,brother - [phone] is the HCPOA  TOTAL TIME TAKING CARE OF THIS PATIENT: 43  minutes.   Note: This dictation was prepared with Dragon dictation along with smaller phrase technology. Any transcriptional errors that result from this process are unintentional.  Nicholes Mango M.D on 10/04/2016 at 7:31 PM  Between 7am to 6pm - Pager - [phone]  After 6pm go to [email or web address] - password [email or web address] [facility]  [facility]  Office  [phone]  CC: Primary care physician; [name], MD

## 2016-10-04 NOTE — ED Notes (Signed)
Pt sitting in bed, pt eating a sandwich, pt aware of pending admission

## 2016-10-04 NOTE — ED Notes (Signed)
Pt resting in bed, TV turned on for pt, pt awake and alert in no acute distress

## 2016-10-04 NOTE — ED Notes (Signed)
Pt returned from [place], resting in bed in no acute distress

## 2016-10-04 NOTE — ED Notes (Signed)
Code  Sepsis  Called to   [place]

## 2016-10-05 ENCOUNTER — Inpatient Hospital Stay: Payer: Medicare Other

## 2016-10-05 DIAGNOSIS — M79605 Pain in left leg: Secondary | ICD-10-CM

## 2016-10-05 DIAGNOSIS — R410 Disorientation, unspecified: Secondary | ICD-10-CM

## 2016-10-05 DIAGNOSIS — R6520 Severe sepsis without septic shock: Secondary | ICD-10-CM

## 2016-10-05 LAB — COMPREHENSIVE METABOLIC PANEL
ALBUMIN: 1.8 g/dL — AB (ref 3.5–5.0)
ALT: 22 U/L (ref 17–63)
AST: 59 U/L — AB (ref 15–41)
Alkaline Phosphatase: 43 U/L (ref 38–126)
Anion gap: 11 (ref 5–15)
BILIRUBIN TOTAL: 0.6 mg/dL (ref 0.3–1.2)
BUN: 96 mg/dL — AB (ref 6–20)
CO2: 18 mmol/L — AB (ref 22–32)
Calcium: 8.1 mg/dL — ABNORMAL LOW (ref 8.9–10.3)
Chloride: 101 mmol/L (ref 101–111)
Creatinine, Ser: 4.53 mg/dL — ABNORMAL HIGH (ref 0.61–1.24)
GFR calc Af Amer: 15 mL/min — ABNORMAL LOW (ref >60)
GFR calc non Af Amer: 13 mL/min — ABNORMAL LOW (ref >60)
GLUCOSE: 90 mg/dL (ref 65–99)
POTASSIUM: 4.2 mmol/L (ref 3.5–5.1)
SODIUM: 130 mmol/L — AB (ref 135–145)
TOTAL PROTEIN: 5 g/dL — AB (ref 6.5–8.1)

## 2016-10-05 LAB — BLOOD CULTURE ID PANEL (REFLEXED)
Acinetobacter baumannii: NOT DETECTED
CANDIDA ALBICANS: NOT DETECTED
CARBAPENEM RESISTANCE: NOT DETECTED
Candida glabrata: NOT DETECTED
Candida krusei: NOT DETECTED
Candida parapsilosis: NOT DETECTED
Candida tropicalis: NOT DETECTED
ENTEROBACTER CLOACAE COMPLEX: NOT DETECTED
ENTEROBACTERIACEAE SPECIES: DETECTED — AB
ENTEROCOCCUS SPECIES: NOT DETECTED
Escherichia coli: DETECTED — AB
HAEMOPHILUS INFLUENZAE: NOT DETECTED
Klebsiella oxytoca: NOT DETECTED
Klebsiella pneumoniae: NOT DETECTED
LISTERIA MONOCYTOGENES: NOT DETECTED
Neisseria meningitidis: NOT DETECTED
PSEUDOMONAS AERUGINOSA: NOT DETECTED
Proteus species: NOT DETECTED
STAPHYLOCOCCUS AUREUS BCID: NOT DETECTED
STREPTOCOCCUS AGALACTIAE: NOT DETECTED
STREPTOCOCCUS PNEUMONIAE: NOT DETECTED
STREPTOCOCCUS PYOGENES: NOT DETECTED
STREPTOCOCCUS SPECIES: NOT DETECTED
Serratia marcescens: NOT DETECTED
Staphylococcus species: NOT DETECTED

## 2016-10-05 LAB — PROTEIN / CREATININE RATIO, URINE
Creatinine, Urine: 29 mg/dL
Protein Creatinine Ratio: 0.86 mg/mg{Cre} — ABNORMAL HIGH (ref 0.00–0.15)
Total Protein, Urine: 25 mg/dL

## 2016-10-05 LAB — PHOSPHORUS: Phosphorus: 5 mg/dL — ABNORMAL HIGH (ref 2.5–4.6)

## 2016-10-05 LAB — CBC
HEMATOCRIT: 26.4 % — AB (ref 40.0–52.0)
HEMOGLOBIN: 9 g/dL — AB (ref 13.0–18.0)
MCH: 30.7 pg (ref 26.0–34.0)
MCHC: 34.2 g/dL (ref 32.0–36.0)
MCV: 89.8 fL (ref 80.0–100.0)
Platelets: 18 10*3/uL — CL (ref 150–440)
RBC: 2.93 MIL/uL — ABNORMAL LOW (ref 4.40–5.90)
RDW: 14.8 % — AB (ref 11.5–14.5)
WBC: 13.1 10*3/uL — ABNORMAL HIGH (ref 3.8–10.6)

## 2016-10-05 LAB — PROTIME-INR
INR: 1.19
Prothrombin Time: 15.2 seconds (ref 11.4–15.2)

## 2016-10-05 MED ORDER — OXYCODONE HCL 5 MG PO TABS
5.0000 mg | ORAL_TABLET | Freq: Four times a day (QID) | ORAL | Status: DC | PRN
  Administered 2016-10-05 – 2016-10-22 (×11): 5 mg via ORAL
  Filled 2016-10-05 (×12): qty 1

## 2016-10-05 MED ORDER — MEROPENEM-SODIUM CHLORIDE 500 MG/50ML IV SOLR
500.0000 mg | Freq: Two times a day (BID) | INTRAVENOUS | Status: DC
  Administered 2016-10-05 – 2016-10-07 (×5): 500 mg via INTRAVENOUS
  Filled 2016-10-05 (×7): qty 50

## 2016-10-05 MED ORDER — ASENAPINE MALEATE 5 MG SL SUBL
10.0000 mg | SUBLINGUAL_TABLET | Freq: Every day | SUBLINGUAL | Status: DC
  Administered 2016-10-05 – 2016-10-11 (×7): 10 mg via SUBLINGUAL
  Filled 2016-10-05 (×8): qty 2

## 2016-10-05 MED ORDER — DIVALPROEX SODIUM ER 500 MG PO TB24
500.0000 mg | ORAL_TABLET | Freq: Every day | ORAL | Status: DC
  Administered 2016-10-05: 500 mg via ORAL
  Filled 2016-10-05: qty 1

## 2016-10-05 MED ORDER — ACETAMINOPHEN 325 MG PO TABS
650.0000 mg | ORAL_TABLET | Freq: Four times a day (QID) | ORAL | Status: DC | PRN
  Administered 2016-10-09 – 2016-10-22 (×29): 650 mg via ORAL
  Filled 2016-10-05 (×31): qty 2

## 2016-10-05 MED ORDER — SODIUM CHLORIDE 0.9 % IV BOLUS (SEPSIS)
1000.0000 mL | Freq: Once | INTRAVENOUS | Status: AC
  Administered 2016-10-05: 1000 mL via INTRAVENOUS

## 2016-10-05 MED ORDER — DIVALPROEX SODIUM ER 500 MG PO TB24: 1000.0000 mg | ORAL_TABLET | Freq: Every day | ORAL | Status: DC

## 2016-10-05 MED ORDER — SODIUM CHLORIDE 0.9 % IV SOLN
500.0000 mg | Freq: Two times a day (BID) | INTRAVENOUS | Status: DC
  Filled 2016-10-05: qty 0.5

## 2016-10-05 MED ORDER — ASENAPINE MALEATE 5 MG SL SUBL
5.0000 mg | SUBLINGUAL_TABLET | Freq: Every morning | SUBLINGUAL | Status: DC
  Administered 2016-10-05 – 2016-10-12 (×8): 5 mg via SUBLINGUAL
  Filled 2016-10-05 (×11): qty 1

## 2016-10-05 MED ORDER — SODIUM CHLORIDE 0.9 % IV SOLN
INTRAVENOUS | Status: DC
  Administered 2016-10-05 – 2016-10-07 (×3): via INTRAVENOUS

## 2016-10-05 NOTE — Progress Notes (Signed)
[facility] NAME: Brandon Maynard    MR#:  [id number]  DATE OF BIRTH:  1956-02-12  SUBJECTIVE:  Came in with left leg swelling and erythema  REVIEW OF SYSTEMS:   Review of Systems  Constitutional: Positive for fever. Negative for chills and weight loss.  HENT: Negative for ear discharge, ear pain and nosebleeds.   Eyes: Negative for blurred vision, pain and discharge.  Respiratory: Negative for sputum production, shortness of breath, wheezing and stridor.   Cardiovascular: Positive for leg swelling. Negative for chest pain, palpitations, orthopnea and PND.  Gastrointestinal: Negative for abdominal pain, diarrhea, nausea and vomiting.  Genitourinary: Negative for frequency and urgency.  Musculoskeletal: Positive for joint pain. Negative for back pain.  Neurological: Positive for weakness. Negative for sensory change, speech change and focal weakness.  Psychiatric/Behavioral: Negative for depression and hallucinations. The patient is not nervous/anxious.    Tolerating Diet:yes Tolerating PT: pending  DRUG ALLERGIES:   Allergies  Allergen Reactions  . Tetracyclines & Related Hives    VITALS:  Blood pressure (!) 84/53, pulse 78, temperature 97.8 F (36.6 C), temperature source Oral, resp. rate 15, height 6\' 3"  (1.905 m), weight 83.9 kg (185 lb), SpO2 100 %.  PHYSICAL EXAMINATION:   Physical Exam  GENERAL:  60 y.o.-year-old patient lying in the bed with no acute distress.  EYES: Pupils equal, round, reactive to light and accommodation. No scleral icterus. Extraocular muscles intact.  HEENT: Head atraumatic, normocephalic. Oropharynx and nasopharynx clear.  NECK:  Supple, no jugular venous distention. No thyroid enlargement, no tenderness.  LUNGS: Normal breath sounds bilaterally, no wheezing, rales, rhonchi. No use of accessory muscles of respiration.  CARDIOVASCULAR: S1, S2 normal. No murmurs, rubs, or gallops.  ABDOMEN:  Soft, nontender, nondistended. Bowel sounds present. No organomegaly or mass.  EXTREMITIES: bilateral chronic edema with left leg swelling and large blister+ with erthma NEUROLOGIC: Cranial nerves II through XII are intact. No focal Motor or sensory deficits b/l.   PSYCHIATRIC:  patient is alert and oriented x 3.  SKIN: No obvious rash, lesion, or ulcer.   LABORATORY PANEL:  CBC  Recent Labs Lab 10/05/16 0406  WBC 13.1*  HGB 9.0*  HCT 26.4*  PLT 18*    Chemistries   Recent Labs Lab 10/05/16 0406  NA 130*  K 4.2  CL 101  CO2 18*  GLUCOSE 90  BUN 96*  CREATININE 4.53*  CALCIUM 8.1*  AST 59*  ALT 22  ALKPHOS 43  BILITOT 0.6   Cardiac Enzymes No results for input(s): TROPONINI in the last 168 hours. RADIOLOGY:  US Venous Img Lower Unilateral Left  Result Date: 10/04/2016 CLINICAL DATA:  Left leg swelling for 1 day. Redness and blistering. EXAM: LEFT LOWER EXTREMITY VENOUS DOPPLER ULTRASOUND TECHNIQUE: Gray-scale sonography with graded compression, as well as color Doppler and duplex ultrasound, were performed to evaluate the deep venous system from the level of the common femoral vein through the popliteal and proximal calf veins. Spectral Doppler was utilized to evaluate flow at rest and with distal augmentation maneuvers. COMPARISON:  05/05/2011 FINDINGS: Right common femoral vein is patent without thrombus. Normal compressibility, augmentation and color Doppler flow in the left common femoral vein, left femoral vein and left popliteal vein. The left saphenofemoral junction is patent. Left profunda femoral vein is patent without thrombus. Subcutaneous edema in the left calf. Visualized left deep calf veins are patent without thrombus. IMPRESSION: Negative for deep venous thrombosis in left lower  extremity. Electronically Signed   By: Markus Daft M.D.   On: 10/04/2016 16:50   ASSESSMENT AND PLAN:   Brandon Maynard  is a 60 y.o. male with a known history of Bipolar disorder,  cataract, chronic thrombocytopenia is presenting to the ED with a chief complaint of significant worsening of left lower extremity pain, redness and swelling which started with a small blister on the left foot. Denies any insect bites  #Sepsis meets criteria with leukocytosis, elevated lactic acid and hypotension. Secondary to left leg cellulitis IV Zosyn and vancomycin---to IV meropenem since BC GNR Will consider ID consult if no improvement in the next 24-48 hours Pain management Keep legs elevated -Vascular surgery to eval-severe blisters and possible underlying PAD  #GERD-Pepcid  #Hyponatremia-probably chronic from adverse effects of the psych medications Provided gentle hydration with IV fluids. Mentating fine. Repeat a.m. labs. Psych consult is placed  #Thrombocytopenia significant platelet count is 42,000 -plt 42K---18K -multifactorial. Pt has been evaluated by Oncology in the past and appears due to meds/sepsis and CKD Psych consult is placed for possible psych medication adjustment given significant TCP   #DVT prophylaxis with SCDs  Case discussed with Care Management/Social Worker. Management plans discussed with the patient, family and they are in agreement.  CODE STATUS:FULL DVT Prophylaxis: SCD TOTAL TIME TAKING CARE OF THIS PATIENT 40 minutes.  >50% time spent on counselling and coordination of care  POSSIBLE D/C IN 1-2 DAYS, DEPENDING ON CLINICAL CONDITION.  Note: This dictation was prepared with Dragon dictation along with smaller phrase technology. Any transcriptional errors that result from this process are unintentional.  Orvil Faraone M.D on 10/05/2016 at 1:42 PM  Between 7am to 6pm - Pager - [phone]  After 6pm go to [email or web address] - password [email or web address] [facility]  [facility]  Office  [phone]  CC: Primary care physician; [name], MD

## 2016-10-05 NOTE — Clinical Social Work Note (Signed)
Clinical Social Work Assessment  Patient Details  Name: Brandon Maynard. MRN: [id number] Date of Birth: 1956/06/22  Date of referral:  10/05/16               Reason for consult:  Discharge Planning (admitted from group home: [facility])                Permission sought to share information with:  Case Manager, Customer service manager, Family Supports Permission granted to share information::  Yes, Verbal Permission Granted  Name::        Agency::  [facility] Home:  [name] (Director)  [phone]  Relationship::  Brother (1st contact)  Cathleen Corti  [phone]  Contact Information:     Housing/Transportation Living arrangements for the past 2 months:  Group Home ([facility]) Source of Information:  Medical Team, Facility, Other (Comment Required) (brother) Patient Interpreter Needed:  None Criminal Activity/Legal Involvement Pertinent to Current Situation/Hospitalization:  No - Comment as needed Significant Relationships:  Other Family Members, Siblings, Community Support Lives with:  Facility Resident Do you feel safe going back to the place where you live?  Yes Need for family participation in patient care:  Yes (Comment) (patient is his own guardian, but in the event he cannot make a decision, his brother would need to be contacted.)  Care giving concerns:  LCSW was able to reach patient's brother who lives in [place].  Brother voices no concerns with care at group home. Patient is independent with all ADLs and walking.  He is hoping patient will return to group home.   Reports his mental health dx has been controlled and he is taking his medications. Reports he is at baseline, but at times he has low frustration levels and can become agitated.  Brother reports he calls regularly as he feels safe and calm when talking to brother.   Social Worker assessment / plan:  LCSW completed assessment as patient admits from group home [facility]. His plan will be to return at  discharge as this is a long term placement which helps manage his behaviors and daily needs. Call placed to [name] in effort to update and discuss treatment plan.  Message was left.  Will stay in contact with brother and update him on patient's progress and needs.  Psych MD called to review medication and monitor. If patient is in need of long term IV antibiotics, higher level of care may be warranted at discharge. Brother made aware of this, but in hopes patient can transition to all PO medications.  Employment status:  Disabled (Comment on whether or not currently receiving Disability) Insurance information:  Medicare, Medicaid [place] PT Recommendations:  Not assessed at this time Information / Referral to community resources:     Patient/Family's Response to care:  Agreeable to plan  Patient/Family's Understanding of and Emotional Response to Diagnosis, Current Treatment, and Prognosis:  Brother verbalizes appreciation for call and updated. He is aware of admitted dx and reports when he talked to patient this past weekend he did not sound like himself and asked for him to be taken to MD.   Emotional Assessment Appearance:  Appears stated age Attitude/Demeanor/Rapport:  Apprehensive, Guarded Affect (typically observed):  Anxious Orientation:  Oriented to Self, Oriented to Place Alcohol / Substance use:  Not Applicable Psych involvement (Current and /or in the community):  Yes (Comment), Outpatient Provider (pt with dx of schizoaffective disorder)  Discharge Needs  Concerns to be addressed:  Denies  Needs/Concerns at this time Readmission within the last 30 days:  No Current discharge risk:  None Barriers to Discharge:  Continued Medical Work up, Other (possible need for higher level of care if needing IV antibiotics)   [place], LCSW 10/05/2016, 10:51 AM

## 2016-10-05 NOTE — Consult Note (Signed)
[facility] Face-to-Face Psychiatry Consult   Reason for Consult:  Consult for 60 year old man with a history of schizoaffective or bipolar disorder. Question about medication and thrombocytopenia Referring Physician: Posey Pronto Patient Identification: Brandon Maynard. MRN:  [id number] Principal Diagnosis: Acute delirium Diagnosis:   Patient Active Problem List   Diagnosis Date Noted  . Acute delirium [R41.0] 10/05/2016  . Left leg cellulitis [G40.102] 10/04/2016  . B12 deficiency [E53.8] 07/06/2016  . Anemia [D64.9] 06/16/2016  . Thrombocytopenia ([place]) [D69.6] 06/16/2016  . Weight loss [R63.4] 06/16/2016  . Esophageal reflux [K21.9] 05/12/2016  . 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 Schizoaffective disorder ([place]) [F25.9] 06/13/2014    Total Time spent with patient: 1 hour  Subjective:   Brandon Maynard. is a 60 y.o. male patient admitted with "I'm okay".  HPI:  Patient interviewed. Chart reviewed. This is a 60 year old man with a history of chronic mental health problems variously diagnosed as bipolar or schizoaffective disorder. He is currently in the hospital primarily for a cellulitis on his left leg. Concern was raised about his thrombocytopenia getting worse. Patient has chronic thrombocytopenia and has seen hematology about it. It sounds like there is reason to think that it may be at least in part related to medication toxicity. On interview this evening however I found the patient to have an abnormal mental status. He seems significantly more tired and withdrawn and confused than I had expected. Patient is unable to keep his eyes open, his voice is slurred and difficult to understand, he tends to ramble often have trouble answering questions directly. I eventually was able to engage him in some reasonable history. He has only partial insight into his current mental status. Minimizes the degree to which she has been feeling  sick. Denies being suicidal or homicidal. Does say that his mood is been more down since his mother died earlier this year. Says that he has some trouble sleeping at night but it is not consistent. He denies having any visual hallucinations but seems to indicate at times that he does have auditory hallucinations that are still on and off. Denies any abuse of any drugs or alcohol.  Social history: Patient lives in a group home. Has been there for a long time. Sounds like his brother is his closest relative and most involved in overseeing his care. Mother died earlier this year. Patient indicates positive feelings towards his brother and remaining family.  Medical history: Patient has chronic thrombocytopenia that has been noted for years but it's significantly lowered this time than it had been on most previous draws. He also had low sodium on first admission but has not had severe hyponatremia in the past. History of chronic pain. History of gastric reflux. Currently in the hospital for a cellulitis.  Substance abuse history: He says that he abused drugs and alcohol in his teens but not ever since then.  Past Psychiatric History: Long-standing psychiatric diagnosis. Looking back through his old chart I can't find any psychiatric hospitalizations in the past few years. Have to go back to around 2011 or earlier for hospitalizations. At that time he was described as having a psychotic disorder. Patient denies any history of suicide attempts or violence. He does say that he is most troubled by psychotic symptoms and has had more mania than depression. The medicine combination of Depakote and Saphris and Prozac looks like it's been stable for years although the dose of Depakote has varied.  Risk to Self: Is patient at risk for suicide?: No Risk to Others:   Prior Inpatient Therapy:   Prior Outpatient Therapy:    Past Medical History:  Past Medical History:  Diagnosis Date  . Anemia   . Bipolar disorder  ([place])   . Cancer ([place])   . Cataract    right eye  . DDD (degenerative disc disease)   . GERD (gastroesophageal reflux disease)   . Heart murmur   . Hyperlipidemia   . Hypertension   . Paranoid schizophrenia ([place])   . 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 Thyroid disease     Past Surgical History:  Procedure Laterality Date  . Dermal Abrasion  1973   Family History:  Family History  Problem Relation Age of Onset  . Brain cancer Maternal Uncle   . Breast cancer Mother   . Melanoma Mother   . Congestive Heart Failure Father   . Melanoma Father   . Breast cancer Maternal Aunt   . Diabetes Maternal Aunt    Family Psychiatric  History: Patient says he does not know of any family history of mental illness Social History:  History  Alcohol Use No     History  Drug Use No    Social History   Social History  . Marital status: Single    Spouse name: N/A  . Number of children: N/A  . Years of education: N/A   Social History Main Topics  . Smoking status: Current Every Day Smoker    Last attempt to quit: 04/09/1991  . Smokeless tobacco: Never Used  . Alcohol use No  . Drug use: No  . Sexual activity: Not Asked   Other Topics Concern  . None   Social History Narrative  . None   Additional Social History:    Allergies:   Allergies  Allergen Reactions  . Tetracyclines & Related Hives    Labs:  Results for orders placed or performed during the hospital encounter of 10/04/16 (from the past 48 hour(s))  Comprehensive metabolic panel     Status: Abnormal   Collection Time: 10/04/16  3:51 PM  Result Value Ref Range   Sodium 127 (L) 135 - 145 mmol/L   Potassium 4.4 3.5 - 5.1 mmol/L   Chloride 94 (L) 101 - 111 mmol/L   CO2 19 (L) 22 - 32 mmol/L   Glucose, Bld 99 65 - 99 mg/dL   BUN 85 (H) 6 - 20 mg/dL   Creatinine, Ser 4.33 (H) 0.61 - 1.24 mg/dL   Calcium 8.9 8.9 - 10.3 mg/dL   Total Protein 6.8 6.5 - 8.1 g/dL   Albumin 2.6 (L) 3.5 - 5.0 g/dL   AST 53 (H) 15 - 41  U/L   ALT 22 17 - 63 U/L   Alkaline Phosphatase 70 38 - 126 U/L   Total Bilirubin 0.8 0.3 - 1.2 mg/dL   GFR calc non Af Amer 13 (L) >60 mL/min   GFR calc Af Amer 16 (L) >60 mL/min    Comment: (NOTE) The eGFR has been calculated using the CKD EPI equation. This calculation has not been validated in all clinical situations. eGFR's persistently <60 mL/min signify possible Chronic Kidney Disease.    Anion gap 14 5 - 15  CBC WITH DIFFERENTIAL     Status: Abnormal   Collection Time: 10/04/16  3:51 PM  Result Value Ref Range   WBC 12.7 (H) 3.8 - 10.6 K/uL   RBC 3.68 (L) 4.40 - 5.90 MIL/uL  Hemoglobin 11.4 (L) 13.0 - 18.0 g/dL   HCT 34.0 (L) 40.0 - 52.0 %   MCV 92.3 80.0 - 100.0 fL   MCH 31.1 26.0 - 34.0 pg   MCHC 33.7 32.0 - 36.0 g/dL   RDW 14.5 11.5 - 14.5 %   Platelets 42 (L) 150 - 440 K/uL   Neutrophils Relative % 75 %   Lymphocytes Relative 4 %   Monocytes Relative 6 %   Eosinophils Relative 0 %   Basophils Relative 0 %   Band Neutrophils 10 %   Metamyelocytes Relative 2 %   Myelocytes 3 %   Promyelocytes Absolute 0 %   Blasts 0 %   nRBC 0 0 /100 WBC   Other 0 %   Neutro Abs 11.4 (H) 1.4 - 6.5 K/uL   Lymphs Abs 0.5 (L) 1.0 - 3.6 K/uL   Monocytes Absolute 0.8 0.2 - 1.0 K/uL   Eosinophils Absolute 0.0 0 - 0.7 K/uL   Basophils Absolute 0.0 0 - 0.1 K/uL  Blood Culture (routine x 2)     Status: None (Preliminary result)   Collection Time: 10/04/16  3:51 PM  Result Value Ref Range   Specimen Description BLOOD RT AC    Special Requests BOTTLES DRAWN AEROBIC AND ANAEROBIC 10CC    Culture  Setup Time      Organism ID to follow GRAM NEGATIVE RODS IN BOTH AEROBIC AND ANAEROBIC BOTTLES CRITICAL RESULT CALLED TO, READ BACK BY AND VERIFIED WITH: NATE COOKSON ON 10/05/16 AT 0448 BY TLB CONFIRMED BY TLB    Culture GRAM NEGATIVE RODS    Report Status PENDING   Blood Culture ID Panel (Reflexed)     Status: Abnormal   Collection Time: 10/04/16  3:51 PM  Result Value Ref Range    Enterococcus species NOT DETECTED NOT DETECTED   Listeria monocytogenes NOT DETECTED NOT DETECTED   Staphylococcus species NOT DETECTED NOT DETECTED   Staphylococcus aureus NOT DETECTED NOT DETECTED   Streptococcus species NOT DETECTED NOT DETECTED   Streptococcus agalactiae NOT DETECTED NOT DETECTED   Streptococcus pneumoniae NOT DETECTED NOT DETECTED   Streptococcus pyogenes NOT DETECTED NOT DETECTED   Acinetobacter baumannii NOT DETECTED NOT DETECTED   Enterobacteriaceae species DETECTED (A) NOT DETECTED    Comment: CRITICAL RESULT CALLED TO, READ BACK BY AND VERIFIED WITH: NATE COOKSON ON 10/05/16 AT 0448 BY TLB    Enterobacter cloacae complex NOT DETECTED NOT DETECTED   Escherichia coli DETECTED (A) NOT DETECTED    Comment: CRITICAL RESULT CALLED TO, READ BACK BY AND VERIFIED WITH: NATE COOKSON ON 10/05/16 AT 0448 BY TLB                                                                                                Klebsiella oxytoca NOT DETECTED NOT DETECTED   Klebsiella pneumoniae NOT DETECTED NOT DETECTED   Proteus species NOT DETECTED NOT DETECTED   Serratia marcescens NOT DETECTED NOT DETECTED   Carbapenem resistance NOT DETECTED NOT DETECTED   Haemophilus influenzae NOT DETECTED NOT DETECTED   Neisseria meningitidis NOT DETECTED NOT DETECTED   Pseudomonas  aeruginosa NOT DETECTED NOT DETECTED   Candida albicans NOT DETECTED NOT DETECTED   Candida glabrata NOT DETECTED NOT DETECTED   Candida krusei NOT DETECTED NOT DETECTED   Candida parapsilosis NOT DETECTED NOT DETECTED   Candida tropicalis NOT DETECTED NOT DETECTED  Lactic acid, plasma     Status: Abnormal   Collection Time: 10/04/16  3:52 PM  Result Value Ref Range   Lactic Acid, Venous 2.6 (HH) 0.5 - 1.9 mmol/L    Comment: CRITICAL RESULT CALLED TO, READ BACK BY AND VERIFIED WITH OLIVIA BROOMER 10/04/16 @ 1744  MLK   Type and screen [place]     Status: None   Collection Time: 10/04/16  3:52 PM   Result Value Ref Range   ABO/RH(D) O POS    Antibody Screen NEG    Sample Expiration 10/07/2016   Valproic acid level     Status: None   Collection Time: 10/04/16  3:54 PM  Result Value Ref Range   Valproic Acid Lvl 53 50.0 - 100.0 ug/mL  Blood Culture (routine x 2)     Status: None (Preliminary result)   Collection Time: 10/04/16  3:56 PM  Result Value Ref Range   Specimen Description BLOOD LT AC    Special Requests BOTTLES DRAWN AEROBIC AND ANAEROBIC 10CC    Culture  Setup Time      GRAM NEGATIVE RODS IN BOTH AEROBIC AND ANAEROBIC BOTTLES CRITICAL RESULT CALLED TO, READ BACK BY AND VERIFIED WITH: NATE COOKSON ON 10/05/16 AT 0448 BY TLB CONFIRMED BY TLB    Culture GRAM NEGATIVE RODS    Report Status PENDING   Lactic acid, plasma     Status: None   Collection Time: 10/04/16  6:52 PM  Result Value Ref Range   Lactic Acid, Venous 1.7 0.5 - 1.9 mmol/L  Urinalysis complete, with microscopic ([facility] only)     Status: Abnormal   Collection Time: 10/04/16  9:01 PM  Result Value Ref Range   Color, Urine YELLOW (A) YELLOW   APPearance HAZY (A) CLEAR   Glucose, UA NEGATIVE NEGATIVE mg/dL   Bilirubin Urine NEGATIVE NEGATIVE   Ketones, ur NEGATIVE NEGATIVE mg/dL   Specific Gravity, Urine 1.005 1.005 - 1.030   Hgb urine dipstick 2+ (A) NEGATIVE   pH 6.0 5.0 - 8.0   Protein, ur 100 (A) NEGATIVE mg/dL   Nitrite NEGATIVE NEGATIVE   Leukocytes, UA 3+ (A) NEGATIVE   RBC / HPF 0-5 0 - 5 RBC/hpf   WBC, UA TOO NUMEROUS TO COUNT 0 - 5 WBC/hpf   Bacteria, UA RARE (A) NONE SEEN   Squamous Epithelial / LPF 0-5 (A) NONE SEEN   WBC Clumps PRESENT   MRSA PCR Screening     Status: None   Collection Time: 10/04/16  9:01 PM  Result Value Ref Range   MRSA by PCR NEGATIVE NEGATIVE    Comment:        The GeneXpert MRSA Assay (FDA approved for NASAL specimens only), is one component of a comprehensive MRSA colonization surveillance program. It is not intended to diagnose MRSA infection nor to  guide or monitor treatment for MRSA infections.   Comprehensive metabolic panel     Status: Abnormal   Collection Time: 10/05/16  4:06 AM  Result Value Ref Range   Sodium 130 (L) 135 - 145 mmol/L   Potassium 4.2 3.5 - 5.1 mmol/L   Chloride 101 101 - 111 mmol/L   CO2 18 (L) 22 - 32 mmol/L  Glucose, Bld 90 65 - 99 mg/dL   BUN 96 (H) 6 - 20 mg/dL   Creatinine, Ser 4.53 (H) 0.61 - 1.24 mg/dL   Calcium 8.1 (L) 8.9 - 10.3 mg/dL   Total Protein 5.0 (L) 6.5 - 8.1 g/dL   Albumin 1.8 (L) 3.5 - 5.0 g/dL   AST 59 (H) 15 - 41 U/L   ALT 22 17 - 63 U/L   Alkaline Phosphatase 43 38 - 126 U/L   Total Bilirubin 0.6 0.3 - 1.2 mg/dL   GFR calc non Af Amer 13 (L) >60 mL/min   GFR calc Af Amer 15 (L) >60 mL/min    Comment: (NOTE) The eGFR has been calculated using the CKD EPI equation. This calculation has not been validated in all clinical situations. eGFR's persistently <60 mL/min signify possible Chronic Kidney Disease.    Anion gap 11 5 - 15  CBC     Status: Abnormal   Collection Time: 10/05/16  4:06 AM  Result Value Ref Range   WBC 13.1 (H) 3.8 - 10.6 K/uL   RBC 2.93 (L) 4.40 - 5.90 MIL/uL   Hemoglobin 9.0 (L) 13.0 - 18.0 g/dL   HCT 26.4 (L) 40.0 - 52.0 %   MCV 89.8 80.0 - 100.0 fL   MCH 30.7 26.0 - 34.0 pg   MCHC 34.2 32.0 - 36.0 g/dL   RDW 14.8 (H) 11.5 - 14.5 %   Platelets 18 (LL) 150 - 440 K/uL    Comment: PLATELET COUNT CONFIRMED BY SMEAR CRITICAL RESULT CALLED TO, READ BACK BY AND VERIFIED WITH: ERWIN MACROHON ON 10/05/16 AT 0350 BY TLB   Protime-INR     Status: None   Collection Time: 10/05/16  4:06 AM  Result Value Ref Range   Prothrombin Time 15.2 11.4 - 15.2 seconds   INR 1.19   Protein / creatinine ratio, urine     Status: Abnormal   Collection Time: 10/05/16  2:11 PM  Result Value Ref Range   Creatinine, Urine 29 mg/dL   Total Protein, Urine 25 mg/dL    Comment: NO NORMAL RANGE ESTABLISHED FOR THIS TEST   Protein Creatinine Ratio 0.86 (H) 0.00 - 0.15 mg/mg[Cre]     Current Facility-Administered Medications  Medication Dose Route Frequency Provider Last Rate Last Dose  . 0.9 %  sodium chloride infusion   Intravenous Continuous Munsoor Lateef, MD 50 mL/hr at 10/05/16 1427    . acetaminophen (TYLENOL) tablet 650 mg  650 mg Oral Q6H PRN Fritzi Mandes, MD      . asenapine (SAPHRIS) sublingual tablet 10 mg  10 mg Sublingual QHS Fritzi Mandes, MD      . asenapine (SAPHRIS) sublingual tablet 5 mg  5 mg Sublingual q morning - 10a Fritzi Mandes, MD   5 mg at 10/05/16 1343  . brimonidine (ALPHAGAN) 0.2 % ophthalmic solution 1 drop  1 drop Both Eyes Q12H Aruna Gouru, MD   1 drop at 10/05/16 0733   And  . timolol (TIMOPTIC) 0.5 % ophthalmic solution 1 drop  1 drop Both Eyes Q12H Nicholes Mango, MD   1 drop at 10/05/16 0733  . docusate sodium (COLACE) capsule 100 mg  100 mg Oral BID Nicholes Mango, MD   100 mg at 10/05/16 0733  . FLUoxetine (PROZAC) capsule 40 mg  40 mg Oral Daily Nicholes Mango, MD   40 mg at 10/05/16 0733  . latanoprost (XALATAN) 0.005 % ophthalmic solution 1 drop  1 drop Both Eyes QHS Nicholes Mango, MD  1 drop at 10/04/16 2212  . levothyroxine (SYNTHROID, LEVOTHROID) tablet 50 mcg  50 mcg Oral QAC breakfast Nicholes Mango, MD   50 mcg at 10/04/16 2208  . meropenem (MERREM) IVPB SOLR 500 mg  500 mg Intravenous BID Nicholes Mango, MD   500 mg at 10/05/16 0529  . multivitamin with minerals tablet 1 tablet  1 tablet Oral Daily Nicholes Mango, MD   1 tablet at 10/05/16 0733  . ondansetron (ZOFRAN) tablet 4 mg  4 mg Oral Q6H PRN Nicholes Mango, MD       Or  . ondansetron (ZOFRAN) injection 4 mg  4 mg Intravenous Q6H PRN Nicholes Mango, MD      . oxyCODONE (Oxy IR/ROXICODONE) immediate release tablet 5 mg  5 mg Oral Q6H PRN Fritzi Mandes, MD      . pyridOXINE (VITAMIN B-6) tablet 100 mg  100 mg Oral BID Nicholes Mango, MD   100 mg at 10/05/16 0733  . simvastatin (ZOCOR) tablet 20 mg  20 mg Oral QHS Nicholes Mango, MD   20 mg at 10/04/16 2208  . traZODone (DESYREL) tablet 100 mg  100 mg Oral  QHS Nicholes Mango, MD   100 mg at 10/04/16 2208   Facility-Administered Medications Ordered in Other Encounters  Medication Dose Route Frequency Provider Last Rate Last Dose  . cyanocobalamin ((VITAMIN B-12)) injection 1,000 mcg  1,000 mcg Intramuscular Once Lequita Asal, MD        Musculoskeletal: Strength & Muscle Tone: decreased Gait & Station: unable to stand Patient leans: Backward  Psychiatric Specialty Exam: Physical Exam  Nursing note and vitals reviewed. Constitutional: He appears well-developed and well-nourished.  HENT:  Head: Normocephalic and atraumatic.  Eyes: Conjunctivae are normal. Pupils are equal, round, and reactive to light.  Neck: Normal range of motion.  Cardiovascular: Normal heart sounds.   Respiratory: Effort normal.  GI: Soft.  Musculoskeletal: Normal range of motion.  Neurological: He is alert.  Skin: Skin is warm and dry.  Psychiatric: His affect is blunt. His speech is delayed. He is slowed and withdrawn. He expresses no homicidal and no suicidal ideation. He exhibits abnormal recent memory and abnormal remote memory. He is inattentive.    Review of Systems  Constitutional: Negative.   HENT: Negative.   Eyes: Negative.   Respiratory: Negative.   Cardiovascular: Negative.   Gastrointestinal: Negative.   Musculoskeletal: Negative.   Skin: Negative.   Neurological: Negative.   Psychiatric/Behavioral: Negative for depression, hallucinations, memory loss, substance abuse and suicidal ideas. The patient is not nervous/anxious and does not have insomnia.     Blood pressure (!) 91/54, pulse 82, temperature 98.5 F (36.9 C), temperature source Oral, resp. rate 18, height _0  (1.905 m), weight 83.9 kg (185 lb), SpO2 94 %.Body mass index is 23.12 kg/m.  General Appearance: Casual  Eye Contact:  Minimal  Speech:  Garbled and Slow  Volume:  Decreased  Mood:  Euthymic  Affect:  Looks very tired and withdrawn.  Thought Process:  Disorganized   Orientation:  Full (Time, Place, and Person)  Thought Content:  Illogical, Rumination and Tangential  Suicidal Thoughts:  No  Homicidal Thoughts:  No  Memory:  Immediate;   Fair Recent;   Poor Remote;   Poor  Judgement:  Impaired  Insight:  Shallow  Psychomotor Activity:  Decreased  Concentration:  Concentration: Fair  Recall:  [employer] of Knowledge:  Fair  Language:  Fair  Akathisia:  No  Handed:  Right  AIMS (  if indicated):     Assets:  Financial Resources/Insurance Housing Social Support  ADL's:  Impaired  Cognition:  Impaired,  Mild  Sleep:        Treatment Plan Summary: Daily contact with patient to assess and evaluate symptoms and progress in treatment, Medication management and Plan    Disposition: Daily contact with patient to assess and evaluate symptoms and progress in treatment, Medication management and Plan 60 year old man with schizoaffective disorder. I'm not sure exactly what his baseline is but his current condition this evening does not seem to probably represent his normal state. I could barely understand most of what he said. He had trouble keeping his eyes open and he frequently drifted off and couldn't answer questions directly. Looks sedated although I'm not sure why that would be the case. I'm going to check his ammonia level but I'm also going to go ahead and stop the Depakote. Of the medicines that he is taking the Depakote is by far the most likely to be associated with thrombocytopenia. I'm going to discontinue the Depakote for now. I informed the patient of this and we will keep an eye on his mood. I will continue his antipsychotic for now. If his mood gets much worse we will have to make some decisions about medication. Lithium is clearly completely out of the question with his current renal function and Tegretol would not be a good option either. Patient agrees to the plan. Orders placed. I will continue to follow-up.  Alethia Berthold, MD 10/05/2016 5:58  PM

## 2016-10-05 NOTE — Progress Notes (Signed)
Patient's latest Platelet count is down to 18 from 42. MD Marcille Blanco made aware through Page. Patient is a case of chronic thrombocytopenia. Kept comfortable. No overt signs of bleeding.

## 2016-10-05 NOTE — Care Management Important Message (Signed)
Important Message  Patient Details  Name: Brandon Maynard. MRN: [id number] Date of Birth: Aug 02, 1956   Medicare Important Message Given:  Yes    Shelbie Ammons, RN 10/05/2016, 9:10 AM

## 2016-10-05 NOTE — Progress Notes (Signed)
Pharmacy Antibiotic Follow-up Note  Brandon Maynard. is a 60 y.o. year-old male admitted on 10/04/2016.  The patient is currently on day 1 of Zosyn/Vanc for cellulitis.  Assessment/Plan: Lab reports GNR in 4 of 4 bottles showing enterobacteriaceae E Coli KPC not detected. Spoke with Dr. Marcille Blanco. Okay to switch to meropenem, renally dosed.  Temp (24hrs), Avg:98.2 F (36.8 C), Min:98.1 F (36.7 C), Max:98.3 F (36.8 C)   Recent Labs Lab 10/04/16 1551  WBC 12.7*    Recent Labs Lab 10/04/16 1551  CREATININE 4.33*   Estimated Creatinine Clearance: 21 mL/min (by C-G formula based on SCr of 4.33 mg/dL (H)).    Allergies  Allergen Reactions  . Tetracyclines & Related Hives    Thank you for allowing pharmacy to be a part of this patient's care.  Laural Benes, Pharm.D., BCPS Clinical Pharmacist 10/05/2016 4:59 AM

## 2016-10-05 NOTE — Progress Notes (Signed)
LCSW spoke with group home director Tim. Patient able to return at DC with no barriers or behaviors at the group home. LCSW will continue to update group home and brother throughout hospitalization. Will need new FL2 at time of DC in which will be completed.  Lane Hacker, MSW Clinical Social Work: [employer] Coverage for :   [phone]

## 2016-10-05 NOTE — Consult Note (Signed)
[facility] Vascular Consult Note  MRN : [id number]  Brandon Maynard. is a 60 y.o. (December 26, 1955) male who presents with chief complaint of  Chief Complaint  Patient presents with  . Foot Pain  .  History of Present Illness: I am asked to evaluate the patient by Dr. Posey Pronto. He is a 60 y.o. male with a known history of Bipolar disorder and chronic thrombocytopenia who presented to the ED yesterday with a chief complaint of significant worsening of left lower extremity pain, redness and swelling bilaterally. Apparently, this all started with a small blister on the left foot. No history of trauma or insect bites.  No similar past episodes documented in the history. DVT ruled out in the emergency department. Patient is started on 2 IV antibiotics  Current Facility-Administered Medications  Medication Dose Route Frequency Provider Last Rate Last Dose  . 0.9 %  sodium chloride infusion   Intravenous Continuous Munsoor Lateef, MD 50 mL/hr at 10/05/16 1427    . acetaminophen (TYLENOL) tablet 650 mg  650 mg Oral Q6H PRN Fritzi Mandes, MD      . asenapine (SAPHRIS) sublingual tablet 10 mg  10 mg Sublingual QHS Fritzi Mandes, MD      . asenapine (SAPHRIS) sublingual tablet 5 mg  5 mg Sublingual q morning - 10a Fritzi Mandes, MD   5 mg at 10/05/16 1343  . brimonidine (ALPHAGAN) 0.2 % ophthalmic solution 1 drop  1 drop Both Eyes Q12H Aruna Gouru, MD   1 drop at 10/05/16 0733   And  . timolol (TIMOPTIC) 0.5 % ophthalmic solution 1 drop  1 drop Both Eyes Q12H Nicholes Mango, MD   1 drop at 10/05/16 0733  . divalproex (DEPAKOTE ER) 24 hr tablet 1,000 mg  1,000 mg Oral QHS Fritzi Mandes, MD      . divalproex (DEPAKOTE ER) 24 hr tablet 500 mg  500 mg Oral Daily Fritzi Mandes, MD   500 mg at 10/05/16 1342  . docusate sodium (COLACE) capsule 100 mg  100 mg Oral BID Nicholes Mango, MD   100 mg at 10/05/16 0733  . FLUoxetine (PROZAC) capsule 40 mg  40 mg Oral Daily Nicholes Mango, MD   40 mg at 10/05/16 0733  .  latanoprost (XALATAN) 0.005 % ophthalmic solution 1 drop  1 drop Both Eyes QHS Nicholes Mango, MD   1 drop at 10/04/16 2212  . levothyroxine (SYNTHROID, LEVOTHROID) tablet 50 mcg  50 mcg Oral QAC breakfast Nicholes Mango, MD   50 mcg at 10/04/16 2208  . meropenem (MERREM) IVPB SOLR 500 mg  500 mg Intravenous BID Nicholes Mango, MD   500 mg at 10/05/16 0529  . multivitamin with minerals tablet 1 tablet  1 tablet Oral Daily Nicholes Mango, MD   1 tablet at 10/05/16 0733  . ondansetron (ZOFRAN) tablet 4 mg  4 mg Oral Q6H PRN Nicholes Mango, MD       Or  . ondansetron (ZOFRAN) injection 4 mg  4 mg Intravenous Q6H PRN Nicholes Mango, MD      . oxyCODONE (Oxy IR/ROXICODONE) immediate release tablet 5 mg  5 mg Oral Q6H PRN Fritzi Mandes, MD      . pyridOXINE (VITAMIN B-6) tablet 100 mg  100 mg Oral BID Nicholes Mango, MD   100 mg at 10/05/16 0733  . simvastatin (ZOCOR) tablet 20 mg  20 mg Oral QHS Nicholes Mango, MD   20 mg at 10/04/16 2208  . traZODone (DESYREL) tablet 100  mg  100 mg Oral QHS Nicholes Mango, MD   100 mg at 10/04/16 2208   Facility-Administered Medications Ordered in Other Encounters  Medication Dose Route Frequency Provider Last Rate Last Dose  . cyanocobalamin ((VITAMIN B-12)) injection 1,000 mcg  1,000 mcg Intramuscular Once Lequita Asal, MD        Past Medical History:  Diagnosis Date  . Anemia   . Bipolar disorder ([place])   . Cancer ([place])   . Cataract    right eye  . DDD (degenerative disc disease)   . GERD (gastroesophageal reflux disease)   . Heart murmur   . Hyperlipidemia   . Hypertension   . Paranoid schizophrenia ([place])   . 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 Thyroid disease     Past Surgical History:  Procedure Laterality Date  . Dermal Abrasion  1973    Social History Social History  Substance Use Topics  . Smoking status: Current Every Day Smoker    Last attempt to quit: 04/09/1991  . Smokeless tobacco: Never Used  . Alcohol use No    Family History Family History  Problem  Relation Age of Onset  . Brain cancer Maternal Uncle   . Breast cancer Mother   . Melanoma Mother   . Congestive Heart Failure Father   . Melanoma Father   . Breast cancer Maternal Aunt   . Diabetes Maternal Aunt   No family history of bleeding/clotting disorders, porphyria or autoimmune disease   Allergies  Allergen Reactions  . Tetracyclines & Related Hives     REVIEW OF SYSTEMS Patient is obtunded and unable to give a review of systems   Physical Examination  Vitals:   10/05/16 0835 10/05/16 0900 10/05/16 1011 10/05/16 1427  BP: (!) 134/118 (!) 81/47 (!) 84/53 (!) 91/54  Pulse: 69  78 82  Resp: 15   18  Temp: 97.8 F (36.6 C)   98.5 F (36.9 C)  TempSrc: Oral   Oral  SpO2: 100%   94%  Weight:      Height:       Body mass index is 23.12 kg/m. Gen:  WD/WN, Moderate distress Head: [place]/AT, No temporalis wasting. Scleral icterus absent. Ear/Nose/Throat:  nares w/o erythema or drainage, oropharynx w/o Erythema/Exudate Eyes: Sclera non-icteric, conjunctiva clear Neck: Trachea midline.  No JVD.  Pulmonary:  Good air movement, respirations not labored, equal bilaterally.  Cardiac: RRR, normal S1, S2. Vascular:  Both lower extremities are massively edematous with the left being somewhat larger than the right. There is severe erythema noted almost to the knee. There is a 10 cm circular blister on the dorsum of the left foot. Right foot is dressed with a bandage. Both feet are pink and warm with brisk capillary refill. There are moderate venous stasis changes. Vessel Right Left  Radial Palpable Palpable  Ulnar Palpable Palpable  Brachial Palpable Palpable  Carotid Palpable, without bruit Palpable, without bruit  Aorta Not palpable N/A  Femoral Palpable Palpable  Popliteal Palpable Palpable  PT Trace Palpable 2+ Palpable  DP 2+ Palpable Trace Palpable   Gastrointestinal: soft, non-tender/non-distended. No guarding/reflex.  Musculoskeletal: Random spontaneous motion noted  all 4 extremities.  Extremities without ischemic changes.  No deformity or atrophy. No edema. Neurologic: Sensation grossly intact in extremities to pain.  Symmetrical.  Speech is garbled he does not answer questions. Motor exam as listed above. Psychiatric: Judgment impaired patient is obtunded and not responding to questions. He is not alert to self place or time, Mood &  affect appropriate for pt's clinical situation. Dermatologic: No rashes or ulcers noted.  No cellulitis or open wounds. Lymph : No Cervical, Axillary, or Inguinal lymphadenopathy.    CBC Lab Results  Component Value Date   WBC 13.1 (H) 10/05/2016   HGB 9.0 (L) 10/05/2016   HCT 26.4 (L) 10/05/2016   MCV 89.8 10/05/2016   PLT 18 (LL) 10/05/2016    BMET    Component Value Date/Time   NA 130 (L) 10/05/2016 0406   NA 141 02/23/2016 1152   K 4.2 10/05/2016 0406   CL 101 10/05/2016 0406   CO2 18 (L) 10/05/2016 0406   GLUCOSE 90 10/05/2016 0406   BUN 96 (H) 10/05/2016 0406   BUN 24 02/23/2016 1152   CREATININE 4.53 (H) 10/05/2016 0406   CALCIUM 8.1 (L) 10/05/2016 0406   GFRNONAA 13 (L) 10/05/2016 0406   GFRAA 15 (L) 10/05/2016 0406   Estimated Creatinine Clearance: 20.1 mL/min (by C-G formula based on SCr of 4.53 mg/dL (H)).  COAG Lab Results  Component Value Date   INR 1.19 10/05/2016   INR 0.94 06/16/2016    Radiology US Renal  Result Date: 10/05/2016 CLINICAL DATA:  Acute renal failure EXAM: RENAL / URINARY TRACT ULTRASOUND COMPLETE COMPARISON:  09/01/2016, 06/28/2016 FINDINGS: Right Kidney: Length: 13.6 cm. Increased cortical echogenicity. 1.3 x 1.2 x 1.1 cm cyst at the midpole. No hydronephrosis. Left Kidney: Length: 14 cm.  Increased cortical echogenicity.  No hydronephrosis. Bladder: Increased echogenicity along the dependent portion could relate to debris or possible wall thickening. IMPRESSION: 1. Increased cortical echogenicity bilaterally, consistent with medical renal disease. No hydronephrosis. 2.  Increased echogenicity along the dependent portion of the bladder, this could represent bladder debris versus wall thickening. Electronically Signed   By: Donavan Foil M.D.   On: 10/05/2016 16:42   US Venous Img Lower Unilateral Left  Result Date: 10/04/2016 CLINICAL DATA:  Left leg swelling for 1 day. Redness and blistering. EXAM: LEFT LOWER EXTREMITY VENOUS DOPPLER ULTRASOUND TECHNIQUE: Gray-scale sonography with graded compression, as well as color Doppler and duplex ultrasound, were performed to evaluate the deep venous system from the level of the common femoral vein through the popliteal and proximal calf veins. Spectral Doppler was utilized to evaluate flow at rest and with distal augmentation maneuvers. COMPARISON:  05/05/2011 FINDINGS: Right common femoral vein is patent without thrombus. Normal compressibility, augmentation and color Doppler flow in the left common femoral vein, left femoral vein and left popliteal vein. The left saphenofemoral junction is patent. Left profunda femoral vein is patent without thrombus. Subcutaneous edema in the left calf. Visualized left deep calf veins are patent without thrombus. IMPRESSION: Negative for deep venous thrombosis in left lower extremity. Electronically Signed   By: Markus Daft M.D.   On: 10/04/2016 16:50      Assessment/Plan 1.   #Sepsis meets criteria with leukocytosis, elevated lactic acid and hypotension. Secondary to left leg cellulitis IV Zosyn and vancomycin---to IV meropenem since [facility] GNR Patient has palpable pulses and warm feet. Do not believe there is arterial insufficiency. He has a negative duplex scan and therefore DVT is unlikely. At this time I do not see the need for further vascular intervention or workup. This appears to be a very severe life-threatening sepsis secondary to lower extremity cellulitis.  #GERD-Pepcid  #Hyponatremia-probably chronic from adverse effects of the psych medications Provided gentle hydration  with IV fluids. Mentating fine. Repeat a.m. labs.   #Thrombocytopenia significant platelet count is 42,000 -plt 42K---18K -multifactorial. Pt  has been evaluated by Oncology in the past and appears due to meds/sepsis and CKD  #DVT prophylaxis is not possible as SCDs are contraindicated given the infection and his platelet count obviates any heparin administration    Katha Cabal, MD 10/05/2016 5:36 PM    This note was created with Dragon medical transcription system.  Any error is purely unintentional

## 2016-10-05 NOTE — Consult Note (Signed)
[facility] CONSULT NOTE    Date: 10/05/2016                  Patient Name:  Woodard Perrell.  MRN: [id number]  DOB: 03-Feb-1956  Age / Sex: 60 y.o., male         PCP: [name], MD                 Service Requesting Consult: Hospitalist                 Reason for Consult: Acute renal failure, CKD stage III            History of Present Illness: Patient is a 60 y.o. male with a PMHx of anemia, bipolar disorder, degenerative disc disease, GERD, hyperlipidemia, hypertension, paranoid schizophrenia, who was admitted to [facility] on 10/04/2016 for evaluation of left leg swelling and redness.  Apparently the patient's illness began with a small blister on the left foot. Thereafter he began experiencing increasing swelling, redness, and pain in the left lower extremity.  He has been diagnosed with cellulitis and is undergoing antibiotic treatment.  We were asked to see him for evaluation management of acute renal failure.the patient's baseline creatinine appears to be 2.1 from 08/03/16 at which point in time his EGFR was 32.  When he presented now BUN was 85 with a creatinine of 4.3.  Patient was significantly hypotensive upon admission.  Patient resides in a group home.   Medications: Outpatient medications: Prescriptions Prior to Admission  Medication Sig Dispense Refill Last Dose  . asenapine (SAPHRIS) 5 MG SUBL 24 hr tablet Place 5-10 mg under the tongue 2 (two) times daily. Take 55m daily in morning and take 115mdaily at bedtime   unknown at unknown  . bimatoprost (LUMIGAN) 0.01 % SOLN Place 1 drop into both eyes at bedtime.   unknown at unknown  . brimonidine-timolol (COMBIGAN) 0.2-0.5 % ophthalmic solution Place 1 drop into both eyes every 12 (twelve) hours.   unknown at unknown  . clonazePAM (KLONOPIN) 1 MG tablet Take 1 tablet (1 mg total) by mouth at bedtime. 30 tablet 0 unknown at unknown  . divalproex (DEPAKOTE ER) 500 MG 24 hr tablet Take 2 tablets (1,000 mg  total) by mouth at bedtime. (Patient taking differently: Take 500 mg by mouth at bedtime. Take one tablet by mouth in am and two tabs by mouth in pm) 60 tablet 0 unknown at unknown  . FLUoxetine (PROZAC) 20 MG capsule Take 40 mg by mouth daily.   unknown at unknown  . labetalol (NORMODYNE) 100 MG tablet Take 100 mg by mouth 2 (two) times daily.   unknown at unknown  . levothyroxine (SYNTHROID, LEVOTHROID) 50 MCG tablet Take 50 mcg by mouth daily before breakfast.   unknown at unknown  . Multiple Vitamin (MULTIVITAMIN WITH MINERALS) TABS tablet Take 1 tablet by mouth daily.   unknown at unknown  . pyridOXINE (VITAMIN B-6) 50 MG tablet Take 100 mg by mouth 2 (two) times daily.   unknown at unknown  . simvastatin (ZOCOR) 20 MG tablet TAKE 1 TABLET BY MOUTH EVERY NIGHT AT BEDTIME 30 tablet 0 unknown at unknown  . traZODone (DESYREL) 100 MG tablet Take 100 mg by mouth at bedtime.   unknown at unknown  . asenapine (SAPHRIS) 5 MG SUBL 24 hr tablet Place 2 tablets (10 mg total) under the tongue at bedtime. (Patient not taking: Reported on 10/04/2016) 60 tablet 0 Not Taking at  Unknown time  . esomeprazole (NEXIUM) 40 MG capsule TAKE 1 CAPSULE BY MOUTH DAILY (Patient not taking: Reported on 10/04/2016) 30 capsule 0 Not Taking at Unknown time  . FLUoxetine (PROZAC) 20 MG tablet Take 2 tablets (40 mg total) by mouth daily. (Patient not taking: Reported on 10/04/2016) 60 tablet 0 Not Taking at Unknown time  . labetalol (NORMODYNE) 100 MG tablet TAKE 1 TABLET BY MOUTH TWICE A DAY (Patient not taking: Reported on 10/04/2016) 60 tablet 0 Not Taking at Unknown time  . labetalol (NORMODYNE) 200 MG tablet Take 0.5 tablets (100 mg total) by mouth 2 (two) times daily. (Patient not taking: Reported on 10/04/2016) 30 tablet 5 Not Taking at Unknown time  . levothyroxine (SYNTHROID, LEVOTHROID) 50 MCG tablet TAKE 1 TABLET BY MOUTH EVERY MORNING 30 tablet 0   . Multiple Vitamins-Minerals (MULTIVITAMIN WITH MINERALS) tablet TAKE 1  TABLET BY MOUTH DAILY (Patient not taking: Reported on 10/04/2016) 30 tablet 0 Not Taking at Unknown time  . traZODone (DESYREL) 100 MG tablet Take 0.5 tablets (50 mg total) by mouth at bedtime. (Patient taking differently: Take 100 mg by mouth at bedtime. )   Taking    Current medications: Current Facility-Administered Medications  Medication Dose Route Frequency Provider Last Rate Last Dose  . acetaminophen (TYLENOL) tablet 650 mg  650 mg Oral Q6H PRN Fritzi Mandes, MD      . asenapine (SAPHRIS) sublingual tablet 10 mg  10 mg Sublingual QHS Fritzi Mandes, MD      . asenapine (SAPHRIS) sublingual tablet 5 mg  5 mg Sublingual q morning - 10a Fritzi Mandes, MD   5 mg at 10/05/16 1343  . brimonidine (ALPHAGAN) 0.2 % ophthalmic solution 1 drop  1 drop Both Eyes Q12H Aruna Gouru, MD   1 drop at 10/05/16 0733   And  . timolol (TIMOPTIC) 0.5 % ophthalmic solution 1 drop  1 drop Both Eyes Q12H Nicholes Mango, MD   1 drop at 10/05/16 0733  . divalproex (DEPAKOTE ER) 24 hr tablet 1,000 mg  1,000 mg Oral QHS Fritzi Mandes, MD      . divalproex (DEPAKOTE ER) 24 hr tablet 500 mg  500 mg Oral Daily Fritzi Mandes, MD   500 mg at 10/05/16 1342  . docusate sodium (COLACE) capsule 100 mg  100 mg Oral BID Nicholes Mango, MD   100 mg at 10/05/16 0733  . FLUoxetine (PROZAC) capsule 40 mg  40 mg Oral Daily Nicholes Mango, MD   40 mg at 10/05/16 0733  . latanoprost (XALATAN) 0.005 % ophthalmic solution 1 drop  1 drop Both Eyes QHS Nicholes Mango, MD   1 drop at 10/04/16 2212  . levothyroxine (SYNTHROID, LEVOTHROID) tablet 50 mcg  50 mcg Oral QAC breakfast Nicholes Mango, MD   50 mcg at 10/04/16 2208  . meropenem (MERREM) IVPB SOLR 500 mg  500 mg Intravenous BID Nicholes Mango, MD   500 mg at 10/05/16 0529  . multivitamin with minerals tablet 1 tablet  1 tablet Oral Daily Nicholes Mango, MD   1 tablet at 10/05/16 0733  . ondansetron (ZOFRAN) tablet 4 mg  4 mg Oral Q6H PRN Nicholes Mango, MD       Or  . ondansetron (ZOFRAN) injection 4 mg  4 mg Intravenous  Q6H PRN Aruna Gouru, MD      . oxyCODONE (Oxy IR/ROXICODONE) immediate release tablet 5 mg  5 mg Oral Q6H PRN Fritzi Mandes, MD      . pyridOXINE (VITAMIN B-6) tablet  100 mg  100 mg Oral BID Nicholes Mango, MD   100 mg at 10/05/16 0733  . simvastatin (ZOCOR) tablet 20 mg  20 mg Oral QHS Nicholes Mango, MD   20 mg at 10/04/16 2208  . traZODone (DESYREL) tablet 100 mg  100 mg Oral QHS Nicholes Mango, MD   100 mg at 10/04/16 2208   Facility-Administered Medications Ordered in Other Encounters  Medication Dose Route Frequency Provider Last Rate Last Dose  . cyanocobalamin ((VITAMIN B-12)) injection 1,000 mcg  1,000 mcg Intramuscular Once Lequita Asal, MD          Allergies: Allergies  Allergen Reactions  . Tetracyclines & Related Hives      Past Medical History: Past Medical History:  Diagnosis Date  . Anemia   . Bipolar disorder ([place])   . Cancer ([place])   . Cataract    right eye  . DDD (degenerative disc disease)   . GERD (gastroesophageal reflux disease)   . Heart murmur   . Hyperlipidemia   . Hypertension   . Paranoid schizophrenia ([place])   . 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 Thyroid disease      Past Surgical History: Past Surgical History:  Procedure Laterality Date  . Dermal Abrasion  1973     Family History: Family History  Problem Relation Age of Onset  . Brain cancer Maternal Uncle   . Breast cancer Mother   . Melanoma Mother   . Congestive Heart Failure Father   . Melanoma Father   . Breast cancer Maternal Aunt   . Diabetes Maternal Aunt      Social History: Social History   Social History  . Marital status: Single    Spouse name: N/A  . Number of children: N/A  . Years of education: N/A   Occupational History  . Not on file.   Social History Main Topics  . Smoking status: Current Every Day Smoker    Last attempt to quit: 04/09/1991  . Smokeless tobacco: Never Used  . Alcohol use No  . Drug use: No  . Sexual activity: Not on file   Other Topics  Concern  . Not on file   Social History Narrative  . No narrative on file     Review of Systems: Review of Systems  Constitutional: Negative for chills, fever and weight loss.  HENT: Negative for ear pain, hearing loss and tinnitus.   Eyes: Negative for blurred vision and double vision.  Respiratory: Negative for cough and hemoptysis.   Cardiovascular: Positive for leg swelling. Negative for chest pain and orthopnea.  Gastrointestinal: Negative for heartburn, nausea and vomiting.  Genitourinary: Negative for dysuria and urgency.  Musculoskeletal: Negative for myalgias and neck pain.  Skin: Positive for rash.  Neurological: Positive for dizziness.  Endo/Heme/Allergies: Negative for polydipsia. Does not bruise/bleed easily.  Psychiatric/Behavioral: Negative for hallucinations. The patient is nervous/anxious.      Vital Signs: Blood pressure (!) 84/53, pulse 78, temperature 97.8 F (36.6 C), temperature source Oral, resp. rate 15, height 6' 3"  (1.905 m), weight 83.9 kg (185 lb), SpO2 100 %.  Weight trends: Filed Weights   10/04/16 1500 10/04/16 2025  Weight: 83.9 kg (185 lb) 83.9 kg (185 lb)    Physical Exam: General: NAD, sitting up in chair  Head: Normocephalic, atraumatic.  Eyes: Anicteric, EOMI  Nose: Mucous membranes moist, not inflammed, nonerythematous.  Throat: Oropharynx nonerythematous, no exudate appreciated.   Neck: Supple, trachea midline.  Lungs:  Normal respiratory effort. Clear to  auscultation BL without crackles or wheezes.  Heart: RRR. S1 and S2 normal without gallop, murmur, or rubs.  Abdomen:  BS normoactive. Soft, Nondistended, non-tender.  No masses or organomegaly.  Extremities: Left lower extremity cellulitis and swelling, 3+ edema LLE, 1+ RLE  Neurologic: A&O X3, Motor strength is 5/5 in the all 4 extremities  Skin: No visible rashes, scars.    Lab results: Basic Metabolic Panel:  Recent Labs Lab 10/04/16 1551 10/05/16 0406  NA 127* 130*   K 4.4 4.2  CL 94* 101  CO2 19* 18*  GLUCOSE 99 90  BUN 85* 96*  CREATININE 4.33* 4.53*  CALCIUM 8.9 8.1*    Liver Function Tests:  Recent Labs Lab 10/04/16 1551 10/05/16 0406  AST 53* 59*  ALT 22 22  ALKPHOS 70 43  BILITOT 0.8 0.6  PROT 6.8 5.0*  ALBUMIN 2.6* 1.8*   No results for input(s): LIPASE, AMYLASE in the last 168 hours. No results for input(s): AMMONIA in the last 168 hours.  CBC:  Recent Labs Lab 10/04/16 1551 10/05/16 0406  WBC 12.7* 13.1*  NEUTROABS 11.4*  --   HGB 11.4* 9.0*  HCT 34.0* 26.4*  MCV 92.3 89.8  PLT 42* 18*    Cardiac Enzymes: No results for input(s): CKTOTAL, CKMB, CKMBINDEX, TROPONINI in the last 168 hours.  BNP: Invalid input(s): POCBNP  CBG: No results for input(s): GLUCAP in the last 168 hours.  Microbiology: Results for orders placed or performed during the hospital encounter of 10/04/16  Blood Culture (routine x 2)     Status: None (Preliminary result)   Collection Time: 10/04/16  3:51 PM  Result Value Ref Range Status   Specimen Description BLOOD RT [facility]  Final   Special Requests BOTTLES DRAWN AEROBIC AND ANAEROBIC 10CC  Final   Culture  Setup Time   Final    Organism ID to follow GRAM NEGATIVE RODS IN BOTH AEROBIC AND ANAEROBIC BOTTLES CRITICAL RESULT CALLED TO, READ BACK BY AND VERIFIED WITH: NATE COOKSON ON 10/05/16 AT 0448 BY TLB CONFIRMED BY TLB    Culture GRAM NEGATIVE RODS  Final   Report Status PENDING  Incomplete  Blood Culture ID Panel (Reflexed)     Status: Abnormal   Collection Time: 10/04/16  3:51 PM  Result Value Ref Range Status   Enterococcus species NOT DETECTED NOT DETECTED Final   Listeria monocytogenes NOT DETECTED NOT DETECTED Final   Staphylococcus species NOT DETECTED NOT DETECTED Final   Staphylococcus aureus NOT DETECTED NOT DETECTED Final   Streptococcus species NOT DETECTED NOT DETECTED Final   Streptococcus agalactiae NOT DETECTED NOT DETECTED Final   Streptococcus pneumoniae NOT  DETECTED NOT DETECTED Final   Streptococcus pyogenes NOT DETECTED NOT DETECTED Final   Acinetobacter baumannii NOT DETECTED NOT DETECTED Final   Enterobacteriaceae species DETECTED (A) NOT DETECTED Final    Comment: CRITICAL RESULT CALLED TO, READ BACK BY AND VERIFIED WITH: NATE COOKSON ON 10/05/16 AT 0448 BY TLB    Enterobacter cloacae complex NOT DETECTED NOT DETECTED Final   Escherichia coli DETECTED (A) NOT DETECTED Final    Comment: CRITICAL RESULT CALLED TO, READ BACK BY AND VERIFIED WITH: NATE COOKSON ON 10/05/16 AT 0448 BY TLB  Klebsiella oxytoca NOT DETECTED NOT DETECTED Final   Klebsiella pneumoniae NOT DETECTED NOT DETECTED Final   Proteus species NOT DETECTED NOT DETECTED Final   Serratia marcescens NOT DETECTED NOT DETECTED Final   Carbapenem resistance NOT DETECTED NOT DETECTED Final   Haemophilus influenzae NOT DETECTED NOT DETECTED Final   Neisseria meningitidis NOT DETECTED NOT DETECTED Final   Pseudomonas aeruginosa NOT DETECTED NOT DETECTED Final   Candida albicans NOT DETECTED NOT DETECTED Final   Candida glabrata NOT DETECTED NOT DETECTED Final   Candida krusei NOT DETECTED NOT DETECTED Final   Candida parapsilosis NOT DETECTED NOT DETECTED Final   Candida tropicalis NOT DETECTED NOT DETECTED Final  Blood Culture (routine x 2)     Status: None (Preliminary result)   Collection Time: 10/04/16  3:56 PM  Result Value Ref Range Status   Specimen Description BLOOD LT AC  Final   Special Requests BOTTLES DRAWN AEROBIC AND ANAEROBIC 10CC  Final   Culture  Setup Time   Final    GRAM NEGATIVE RODS IN BOTH AEROBIC AND ANAEROBIC BOTTLES CRITICAL RESULT CALLED TO, READ BACK BY AND VERIFIED WITH: NATE COOKSON ON 10/05/16 AT 0448 BY TLB CONFIRMED BY TLB    Culture GRAM NEGATIVE RODS  Final   Report Status PENDING  Incomplete  MRSA PCR Screening     Status: None   Collection Time:  10/04/16  9:01 PM  Result Value Ref Range Status   MRSA by PCR NEGATIVE NEGATIVE Final    Comment:        The GeneXpert MRSA Assay (FDA approved for NASAL specimens only), is one component of a comprehensive MRSA colonization surveillance program. It is not intended to diagnose MRSA infection nor to guide or monitor treatment for MRSA infections.     Coagulation Studies:  Recent Labs  10/05/16 0406  LABPROT 15.2  INR 1.19    Urinalysis:  Recent Labs  10/04/16 2101  COLORURINE YELLOW*  LABSPEC 1.005  PHURINE 6.0  GLUCOSEU NEGATIVE  HGBUR 2+*  BILIRUBINUR NEGATIVE  KETONESUR NEGATIVE  PROTEINUR 100*  NITRITE NEGATIVE  LEUKOCYTESUR 3+*      Imaging: US Venous Img Lower Unilateral Left  Result Date: 10/04/2016 CLINICAL DATA:  Left leg swelling for 1 day. Redness and blistering. EXAM: LEFT LOWER EXTREMITY VENOUS DOPPLER ULTRASOUND TECHNIQUE: Gray-scale sonography with graded compression, as well as color Doppler and duplex ultrasound, were performed to evaluate the deep venous system from the level of the common femoral vein through the popliteal and proximal calf veins. Spectral Doppler was utilized to evaluate flow at rest and with distal augmentation maneuvers. COMPARISON:  05/05/2011 FINDINGS: Right common femoral vein is patent without thrombus. Normal compressibility, augmentation and color Doppler flow in the left common femoral vein, left femoral vein and left popliteal vein. The left saphenofemoral junction is patent. Left profunda femoral vein is patent without thrombus. Subcutaneous edema in the left calf. Visualized left deep calf veins are patent without thrombus. IMPRESSION: Negative for deep venous thrombosis in left lower extremity. Electronically Signed   By: Markus Daft M.D.   On: 10/04/2016 16:50      Assessment & Plan: Pt is a 60 y.o. male with a PMHx of anemia, bipolar disorder, degenerative disc disease, GERD, hyperlipidemia, hypertension, paranoid  schizophrenia, who was admitted to [facility] on 10/04/2016 for evaluation of left leg swelling and redness.   1.  Acute renal failure/CKD stage III baseline Cr 2.1 egfr 32/proteinuria:  Patient admitted with left lower external a cellulitis  as well as hypotension.  Suspect that acute renal failure is related to hypotension and subsequent ischemic changes.  We will start the patient on gentle IV fluid hydration with 0.9 normal saline.  Obtain further serologic workup including SPEP, UPEP, ANA, ANCA antibodies, GBM advise, C3, C4, and renal ultrasound.  2.  Hypotension.  Likely related to underlying infection.  We will start 2.9 normal saline as above.  3.  Left lower extremity cellulitis.  Evaluation and management per hospitalist.  4.  Thanks for consultation.

## 2016-10-06 ENCOUNTER — Inpatient Hospital Stay: Payer: Medicare Other

## 2016-10-06 LAB — PROTEIN ELECTROPHORESIS, SERUM
A/G RATIO SPE: 0.7 (ref 0.7–1.7)
ALBUMIN ELP: 1.9 g/dL — AB (ref 2.9–4.4)
ALPHA-2-GLOBULIN: 0.7 g/dL (ref 0.4–1.0)
Alpha-1-Globulin: 0.4 g/dL (ref 0.0–0.4)
BETA GLOBULIN: 0.9 g/dL (ref 0.7–1.3)
Gamma Globulin: 0.9 g/dL (ref 0.4–1.8)
Globulin, Total: 2.9 g/dL (ref 2.2–3.9)
Total Protein ELP: 4.8 g/dL — ABNORMAL LOW (ref 6.0–8.5)

## 2016-10-06 LAB — BASIC METABOLIC PANEL
ANION GAP: 10 (ref 5–15)
BUN: 104 mg/dL — ABNORMAL HIGH (ref 6–20)
CALCIUM: 7.9 mg/dL — AB (ref 8.9–10.3)
CHLORIDE: 106 mmol/L (ref 101–111)
CO2: 18 mmol/L — AB (ref 22–32)
CREATININE: 4.66 mg/dL — AB (ref 0.61–1.24)
GFR calc non Af Amer: 12 mL/min — ABNORMAL LOW (ref >60)
GFR, EST AFRICAN AMERICAN: 14 mL/min — AB (ref >60)
Glucose, Bld: 116 mg/dL — ABNORMAL HIGH (ref 65–99)
Potassium: 4 mmol/L (ref 3.5–5.1)
SODIUM: 134 mmol/L — AB (ref 135–145)

## 2016-10-06 LAB — CBC
HEMATOCRIT: 28.2 % — AB (ref 40.0–52.0)
HEMOGLOBIN: 9.5 g/dL — AB (ref 13.0–18.0)
MCH: 30.7 pg (ref 26.0–34.0)
MCHC: 33.5 g/dL (ref 32.0–36.0)
MCV: 91.8 fL (ref 80.0–100.0)
Platelets: 22 10*3/uL — CL (ref 150–440)
RBC: 3.08 MIL/uL — ABNORMAL LOW (ref 4.40–5.90)
RDW: 15.4 % — ABNORMAL HIGH (ref 11.5–14.5)
WBC: 19.4 10*3/uL — AB (ref 3.8–10.6)

## 2016-10-06 LAB — ANA W/REFLEX IF POSITIVE: Anti Nuclear Antibody(ANA): NEGATIVE

## 2016-10-06 LAB — MRSA PCR SCREENING: MRSA BY PCR: NEGATIVE

## 2016-10-06 LAB — PARATHYROID HORMONE, INTACT (NO CA): PTH: 75 pg/mL — ABNORMAL HIGH (ref 15–65)

## 2016-10-06 LAB — C3 COMPLEMENT: C3 COMPLEMENT: 116 mg/dL (ref 82–167)

## 2016-10-06 LAB — GLUCOSE, CAPILLARY: GLUCOSE-CAPILLARY: 107 mg/dL — AB (ref 65–99)

## 2016-10-06 LAB — C4 COMPLEMENT: Complement C4, Body Fluid: 15 mg/dL (ref 14–44)

## 2016-10-06 LAB — AMMONIA: AMMONIA: 29 umol/L (ref 9–35)

## 2016-10-06 LAB — GLOMERULAR BASEMENT MEMBRANE ANTIBODIES: GBM AB: 6 U (ref 0–20)

## 2016-10-06 LAB — MPO/PR-3 (ANCA) ANTIBODIES: Myeloperoxidase Abs: <9 U/mL (ref 0.0–9.0)

## 2016-10-06 MED ORDER — NOREPINEPHRINE 4 MG/250ML-% IV SOLN
0.0000 ug/min | INTRAVENOUS | Status: DC
  Filled 2016-10-06: qty 250

## 2016-10-06 MED ORDER — NOREPINEPHRINE BITARTRATE 1 MG/ML IV SOLN: 0.0000 ug/min | INTRAVENOUS | Status: DC

## 2016-10-06 NOTE — Care Management (Addendum)
Message left for group home director Ainsley Spinner [phone] to see if patient can return there with IV antibiotics, what home health agency they prefer, patient assessment/DME, and that patient is now in SDU in ICU. RNCM will continue to follow. Message also left for patient's brother to call this RNCM to discuss discharge planning process.

## 2016-10-06 NOTE — Progress Notes (Signed)
Pt transferred to ICU, report given to [place] RN, pt with no complaints

## 2016-10-06 NOTE — Consult Note (Signed)
Pharmacy Antibiotic Note  Brandon Tanton. is a 60 y.o. male admitted on 10/04/2016 with bacteremia and UTI.  Pharmacy has been consulted for meropenem dosing. Blood cx with ecoli, sensitivities pending  Plan: continue meropenem 500mg  q 12 hr, follow up on sensitivities  Height: 6\' 3"  (190.5 cm) Weight: 185 lb (83.9 kg) IBW/kg (Calculated) : 84.5  Temp (24hrs), Avg:98.4 F (36.9 C), Min:98 F (36.7 C), Max:98.5 F (36.9 C)   Recent Labs Lab 10/04/16 1551 10/04/16 1552 10/04/16 1852 10/05/16 0406 10/06/16 0430  WBC 12.7*  --   --  13.1* 19.4*  CREATININE 4.33*  --   --  4.53* 4.66*  LATICACIDVEN  --  2.6* 1.7  --   --     Estimated Creatinine Clearance: 19.5 mL/min (by C-G formula based on SCr of 4.66 mg/dL (H)).    Allergies  Allergen Reactions  . Tetracyclines & Related Hives    Antimicrobials this admission: vancomycin 11/6 >> 11/6 zosyn 11/6 >> 11/6 Meropenem 11/7>>  Dose adjustments this admission:  Microbiology results: 11/6 BCx: ecoli 11/6 UCx: 30,000 GNR  11/6 MRSA PCR: neg  Thank you for allowing pharmacy to be a part of this patient's care.  Ramond Dial, Pharm.D Clinical Pharmacist 10/06/2016 10:04 AM

## 2016-10-06 NOTE — Progress Notes (Signed)
[facility]  ROUNDING NOTE   Subjective:  Patient still has significant cellulitis of the left lower extremity and also has a large fluid-filled bullous overlying his left foot. Renal function remains quite poor with a BUN of 104 and creatinine of 4.6.   Objective:  Vital signs in last 24 hours:  Temp:  [98 F (36.7 C)-98.5 F (36.9 C)] 98.5 F (36.9 C) (11/08 0719) Pulse Rate:  [82-91] 91 (11/08 0719) Resp:  [16-20] 16 (11/08 0719) BP: (85-95)/(43-54) 95/52 (11/08 0719) SpO2:  [94 %-98 %] 98 % (11/08 0719)  Weight change:  Filed Weights   10/04/16 1500 10/04/16 2025  Weight: 83.9 kg (185 lb) 83.9 kg (185 lb)    Intake/Output: I/O last 3 completed shifts: In: 3556.3 [P.O.:960; I.V.:2296.3; IV Piggyback:300] Out: 1200 [Urine:1200]   Intake/Output this shift:  Total I/O In: 118 [P.O.:118] Out: 125 [Urine:125]  Physical Exam: General: Laying in bed, confused  Head: Normocephalic, atraumatic. Moist oral mucosal membranes  Eyes: Anicteric  Neck: Supple, trachea midline  Lungs:  Clear to auscultation, normal effort  Heart: S1S2 no rubs  Abdomen:  Soft, nontender, BS present  Extremities: 2+ LLE, significant cellulitis of the LLE, bullous lesion on left foot  Neurologic: arousable but confused.  Skin: Bullous overlying left foot       Basic Metabolic Panel:  Recent Labs Lab 10/04/16 1551 10/05/16 0406 10/05/16 1824 10/06/16 0430  NA 127* 130*  --  134*  K 4.4 4.2  --  4.0  CL 94* 101  --  106  CO2 19* 18*  --  18*  GLUCOSE 99 90  --  116*  BUN 85* 96*  --  104*  CREATININE 4.33* 4.53*  --  4.66*  CALCIUM 8.9 8.1*  --  7.9*  PHOS  --   --  5.0*  --     Liver Function Tests:  Recent Labs Lab 10/04/16 1551 10/05/16 0406  AST 53* 59*  ALT 22 22  ALKPHOS 70 43  BILITOT 0.8 0.6  PROT 6.8 5.0*  ALBUMIN 2.6* 1.8*   No results for input(s): LIPASE, AMYLASE in the last 168 hours.  Recent Labs Lab 10/06/16 0430  AMMONIA 29     CBC:  Recent Labs Lab 10/04/16 1551 10/05/16 0406 10/06/16 0430  WBC 12.7* 13.1* 19.4*  NEUTROABS 11.4*  --   --   HGB 11.4* 9.0* 9.5*  HCT 34.0* 26.4* 28.2*  MCV 92.3 89.8 91.8  PLT 42* 18* 22*    Cardiac Enzymes: No results for input(s): CKTOTAL, CKMB, CKMBINDEX, TROPONINI in the last 168 hours.  BNP: Invalid input(s): POCBNP  CBG: No results for input(s): GLUCAP in the last 168 hours.  Microbiology: Results for orders placed or performed during the hospital encounter of 10/04/16  Blood Culture (routine x 2)     Status: Abnormal (Preliminary result)   Collection Time: 10/04/16  3:51 PM  Result Value Ref Range Status   Specimen Description BLOOD RT [facility]  Final   Special Requests BOTTLES DRAWN AEROBIC AND ANAEROBIC 10CC  Final   Culture  Setup Time   Final    Organism ID to follow GRAM NEGATIVE RODS IN BOTH AEROBIC AND ANAEROBIC BOTTLES CRITICAL RESULT CALLED TO, READ BACK BY AND VERIFIED WITH: NATE COOKSON ON 10/05/16 AT 0448 BY TLB CONFIRMED BY TLB    Culture (A)  Final    ESCHERICHIA COLI SUSCEPTIBILITIES TO FOLLOW Performed at [facility]    Report Status PENDING  Incomplete  Blood Culture ID Panel (Reflexed)     Status: Abnormal   Collection Time: 10/04/16  3:51 PM  Result Value Ref Range Status   Enterococcus species NOT DETECTED NOT DETECTED Final   Listeria monocytogenes NOT DETECTED NOT DETECTED Final   Staphylococcus species NOT DETECTED NOT DETECTED Final   Staphylococcus aureus NOT DETECTED NOT DETECTED Final   Streptococcus species NOT DETECTED NOT DETECTED Final   Streptococcus agalactiae NOT DETECTED NOT DETECTED Final   Streptococcus pneumoniae NOT DETECTED NOT DETECTED Final   Streptococcus pyogenes NOT DETECTED NOT DETECTED Final   Acinetobacter baumannii NOT DETECTED NOT DETECTED Final   Enterobacteriaceae species DETECTED (A) NOT DETECTED Final    Comment: CRITICAL RESULT CALLED TO, READ BACK BY AND VERIFIED WITH: NATE COOKSON  ON 10/05/16 AT 0448 BY TLB    Enterobacter cloacae complex NOT DETECTED NOT DETECTED Final   Escherichia coli DETECTED (A) NOT DETECTED Final    Comment: CRITICAL RESULT CALLED TO, READ BACK BY AND VERIFIED WITH: NATE COOKSON ON 10/05/16 AT 0448 BY TLB                                                                                                Klebsiella oxytoca NOT DETECTED NOT DETECTED Final   Klebsiella pneumoniae NOT DETECTED NOT DETECTED Final   Proteus species NOT DETECTED NOT DETECTED Final   Serratia marcescens NOT DETECTED NOT DETECTED Final   Carbapenem resistance NOT DETECTED NOT DETECTED Final   Haemophilus influenzae NOT DETECTED NOT DETECTED Final   Neisseria meningitidis NOT DETECTED NOT DETECTED Final   Pseudomonas aeruginosa NOT DETECTED NOT DETECTED Final   Candida albicans NOT DETECTED NOT DETECTED Final   Candida glabrata NOT DETECTED NOT DETECTED Final   Candida krusei NOT DETECTED NOT DETECTED Final   Candida parapsilosis NOT DETECTED NOT DETECTED Final   Candida tropicalis NOT DETECTED NOT DETECTED Final  Blood Culture (routine x 2)     Status: None (Preliminary result)   Collection Time: 10/04/16  3:56 PM  Result Value Ref Range Status   Specimen Description BLOOD LT AC  Final   Special Requests BOTTLES DRAWN AEROBIC AND ANAEROBIC 10CC  Final   Culture  Setup Time   Final    GRAM NEGATIVE RODS IN BOTH AEROBIC AND ANAEROBIC BOTTLES CRITICAL RESULT CALLED TO, READ BACK BY AND VERIFIED WITH: NATE COOKSON ON 10/05/16 AT 0448 BY TLB CONFIRMED BY TLB    Culture GRAM NEGATIVE RODS  Final   Report Status PENDING  Incomplete  Urine culture     Status: Abnormal (Preliminary result)   Collection Time: 10/04/16  9:01 PM  Result Value Ref Range Status   Specimen Description URINE, RANDOM  Final   Special Requests NONE  Final   Culture 30,000 COLONIES/mL GRAM NEGATIVE RODS (A)  Final   Report Status PENDING  Incomplete  MRSA PCR Screening     Status: None    Collection Time: 10/04/16  9:01 PM  Result Value Ref Range Status   MRSA by PCR NEGATIVE NEGATIVE Final    Comment:        The  GeneXpert MRSA Assay (FDA approved for NASAL specimens only), is one component of a comprehensive MRSA colonization surveillance program. It is not intended to diagnose MRSA infection nor to guide or monitor treatment for MRSA infections.     Coagulation Studies:  Recent Labs  10/05/16 0406  LABPROT 15.2  INR 1.19    Urinalysis:  Recent Labs  10/04/16 2101  COLORURINE YELLOW*  LABSPEC 1.005  PHURINE 6.0  GLUCOSEU NEGATIVE  HGBUR 2+*  BILIRUBINUR NEGATIVE  KETONESUR NEGATIVE  PROTEINUR 100*  NITRITE NEGATIVE  LEUKOCYTESUR 3+*      Imaging: US Renal  Result Date: 10/05/2016 CLINICAL DATA:  Acute renal failure EXAM: RENAL / URINARY TRACT ULTRASOUND COMPLETE COMPARISON:  09/01/2016, 06/28/2016 FINDINGS: Right Kidney: Length: 13.6 cm. Increased cortical echogenicity. 1.3 x 1.2 x 1.1 cm cyst at the midpole. No hydronephrosis. Left Kidney: Length: 14 cm.  Increased cortical echogenicity.  No hydronephrosis. Bladder: Increased echogenicity along the dependent portion could relate to debris or possible wall thickening. IMPRESSION: 1. Increased cortical echogenicity bilaterally, consistent with medical renal disease. No hydronephrosis. 2. Increased echogenicity along the dependent portion of the bladder, this could represent bladder debris versus wall thickening. Electronically Signed   By: Donavan Foil M.D.   On: 10/05/2016 16:42   US Venous Img Lower Unilateral Left  Result Date: 10/04/2016 CLINICAL DATA:  Left leg swelling for 1 day. Redness and blistering. EXAM: LEFT LOWER EXTREMITY VENOUS DOPPLER ULTRASOUND TECHNIQUE: Gray-scale sonography with graded compression, as well as color Doppler and duplex ultrasound, were performed to evaluate the deep venous system from the level of the common femoral vein through the popliteal and proximal calf  veins. Spectral Doppler was utilized to evaluate flow at rest and with distal augmentation maneuvers. COMPARISON:  05/05/2011 FINDINGS: Right common femoral vein is patent without thrombus. Normal compressibility, augmentation and color Doppler flow in the left common femoral vein, left femoral vein and left popliteal vein. The left saphenofemoral junction is patent. Left profunda femoral vein is patent without thrombus. Subcutaneous edema in the left calf. Visualized left deep calf veins are patent without thrombus. IMPRESSION: Negative for deep venous thrombosis in left lower extremity. Electronically Signed   By: Markus Daft M.D.   On: 10/04/2016 16:50     Medications:   . sodium chloride 50 mL/hr at 10/06/16 0421   . asenapine  10 mg Sublingual QHS  . asenapine  5 mg Sublingual q morning - 10a  . brimonidine  1 drop Both Eyes Q12H   And  . timolol  1 drop Both Eyes Q12H  . docusate sodium  100 mg Oral BID  . FLUoxetine  40 mg Oral Daily  . latanoprost  1 drop Both Eyes QHS  . levothyroxine  50 mcg Oral QAC breakfast  . meropenem  500 mg Intravenous BID  . multivitamin with minerals  1 tablet Oral Daily  . pyridOXINE  100 mg Oral BID  . simvastatin  20 mg Oral QHS  . traZODone  100 mg Oral QHS   acetaminophen, ondansetron **OR** ondansetron (ZOFRAN) IV, oxyCODONE  Assessment/ Plan:  60 y.o. male with a PMHx of anemia, bipolar disorder, degenerative disc disease, GERD, hyperlipidemia, hypertension, paranoid schizophrenia, who was admitted to [facility] on 10/04/2016 for evaluation of left leg swelling and redness.   1.  Acute renal failure/CKD stage III baseline Cr 2.1 egfr 32/proteinuria:  Patient admitted with left lower external a cellulitis as well as hypotension.  Suspect that acute renal failure is related to hypotension  and subsequent ischemic changes.  -  Renal function worsening. If renal function continues to worsen tomorrow we will likely need to consider renal replacement therapy.  Unclear if the patient will be able to make this decision for himself as he is confused at the moment.  2.  Hypotension.   blood pressure low at 95/52. Consider removing the patient to the intensive care unit. This was discussed with Dr. Posey Pronto.  3.  Left lower extremity cellulitis.  There remains significant cellulitic change of the left lower extremity along with a large bullous overlying the left foot. Would recommend continued monitoring by surgery as well as vascular surgery.   LOS: 2 Elice Crigger 11/8/201710:57 AM

## 2016-10-06 NOTE — Progress Notes (Signed)
[place] [facility] [place] NAME: Brandon Maynard    MR#:  [id number]  DATE OF BIRTH:  August 17, 1956  SUBJECTIVE:  Came in with left leg swelling and erythema Intermittent sleepiness and confusion. Asking for fires and burger Low bp since y'day. Received bolus of fluid y'day REVIEW OF SYSTEMS:   Review of Systems  Constitutional: Positive for fever. Negative for chills and weight loss.  HENT: Negative for ear discharge, ear pain and nosebleeds.   Eyes: Negative for blurred vision, pain and discharge.  Respiratory: Negative for sputum production, shortness of breath, wheezing and stridor.   Cardiovascular: Positive for leg swelling. Negative for chest pain, palpitations, orthopnea and PND.  Gastrointestinal: Negative for abdominal pain, diarrhea, nausea and vomiting.  Genitourinary: Negative for frequency and urgency.  Musculoskeletal: Positive for joint pain. Negative for back pain.  Neurological: Positive for weakness. Negative for sensory change, speech change and focal weakness.  Psychiatric/Behavioral: Negative for depression and hallucinations. The patient is not nervous/anxious.    Tolerating Diet:yes Tolerating PT: pending  DRUG ALLERGIES:   Allergies  Allergen Reactions  . Tetracyclines & Related Hives    VITALS:  Blood pressure (!) 95/52, pulse 91, temperature 98.5 F (36.9 C), temperature source Oral, resp. rate 16, height 6\' 3"  (1.905 m), weight 83.9 kg (185 lb), SpO2 98 %.  PHYSICAL EXAMINATION:   Physical Exam  GENERAL:  60 y.o.-year-old patient lying in the bed with no acute distress.  EYES: Pupils equal, round, reactive to light and accommodation. No scleral icterus. Extraocular muscles intact.  HEENT: Head atraumatic, normocephalic. Oropharynx and nasopharynx clear.  NECK:  Supple, no jugular venous distention. No thyroid enlargement, no tenderness.  LUNGS: Normal breath sounds bilaterally, no wheezing, rales, rhonchi. No  use of accessory muscles of respiration.  CARDIOVASCULAR: S1, S2 normal. No murmurs, rubs, or gallops.  ABDOMEN: Soft, nontender, nondistended. Bowel sounds present. No organomegaly or mass.  EXTREMITIES: bilateral chronic edema with left leg swelling and large blister+ with erythema NEUROLOGIC: Cranial nerves II through XII are intact. No focal Motor or sensory deficits b/l.   PSYCHIATRIC:  patient is alert but falls asleep instantly and intermittent confusion SKIN: Nas above  LABORATORY PANEL:  CBC  Recent Labs Lab 10/06/16 0430  WBC 19.4*  HGB 9.5*  HCT 28.2*  PLT 22*    Chemistries   Recent Labs Lab 10/05/16 0406 10/06/16 0430  NA 130* 134*  K 4.2 4.0  CL 101 106  CO2 18* 18*  GLUCOSE 90 116*  BUN 96* 104*  CREATININE 4.53* 4.66*  CALCIUM 8.1* 7.9*  AST 59*  --   ALT 22  --   ALKPHOS 43  --   BILITOT 0.6  --    Cardiac Enzymes No results for input(s): TROPONINI in the last 168 hours. RADIOLOGY:  US Renal  Result Date: 10/05/2016 CLINICAL DATA:  Acute renal failure EXAM: RENAL / URINARY TRACT ULTRASOUND COMPLETE COMPARISON:  09/01/2016, 06/28/2016 FINDINGS: Right Kidney: Length: 13.6 cm. Increased cortical echogenicity. 1.3 x 1.2 x 1.1 cm cyst at the midpole. No hydronephrosis. Left Kidney: Length: 14 cm.  Increased cortical echogenicity.  No hydronephrosis. Bladder: Increased echogenicity along the dependent portion could relate to debris or possible wall thickening. IMPRESSION: 1. Increased cortical echogenicity bilaterally, consistent with medical renal disease. No hydronephrosis. 2. Increased echogenicity along the dependent portion of the bladder, this could represent bladder debris versus wall thickening. Electronically Signed   By: Donavan Foil M.D.   On:  10/05/2016 16:42   US Venous Img Lower Unilateral Left  Result Date: 10/04/2016 CLINICAL DATA:  Left leg swelling for 1 day. Redness and blistering. EXAM: LEFT LOWER EXTREMITY VENOUS DOPPLER ULTRASOUND  TECHNIQUE: Gray-scale sonography with graded compression, as well as color Doppler and duplex ultrasound, were performed to evaluate the deep venous system from the level of the common femoral vein through the popliteal and proximal calf veins. Spectral Doppler was utilized to evaluate flow at rest and with distal augmentation maneuvers. COMPARISON:  05/05/2011 FINDINGS: Right common femoral vein is patent without thrombus. Normal compressibility, augmentation and color Doppler flow in the left common femoral vein, left femoral vein and left popliteal vein. The left saphenofemoral junction is patent. Left profunda femoral vein is patent without thrombus. Subcutaneous edema in the left calf. Visualized left deep calf veins are patent without thrombus. IMPRESSION: Negative for deep venous thrombosis in left lower extremity. Electronically Signed   By: Markus Daft M.D.   On: 10/04/2016 16:50   ASSESSMENT AND PLAN:   Brandon Maynard  is a 60 y.o. male with a known history of Bipolar disorder, cataract, chronic thrombocytopenia is presenting to the ED with a chief complaint of significant worsening of left lower extremity pain, redness and swelling which started with a small blister on the left foot. Denies any insect bites  #Sepsis meets criteria with leukocytosis, elevated lactic acid and hypotension. Secondary to left leg cellulitis IV Zosyn and vancomycin---to IV meropenem since [facility] ecoli ID consult today Pain management Keep legs elevated -Vascular surgery eval noted. 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 Mentating fluctuating 130--134  #Thrombocytopenia  significant platelet count is 42,000 -plt 42K---18K--22k -multifactorial. Pt has been evaluated by Oncology in the past and appears due to meds/sepsis and CKD -Depakote has been d/ced by Dr Weber Cooks  #DVT prophylaxis with SCDs  Case discussed with Care Management/Social Worker. Management plans discussed with the patient, family and they are in agreement.  CODE STATUS:FULL DVT Prophylaxis: SCD TOTAL  criticalTIME TAKING CARE OF THIS PATIENT 40 minutes.  >50% time spent on counselling and coordination of care   Note: This dictation was prepared with Dragon dictation along with smaller phrase technology. Any transcriptional errors that result from this process are unintentional.  Brandon Maynard M.D on 10/06/2016 at 11:17 AM  Between 7am to 6pm - Pager - [phone]  After 6pm go to [email or web address] - password [email or web address] [facility]  [facility]  Office  [phone]  CC: Primary care physician; [name], MD

## 2016-10-06 NOTE — Consult Note (Signed)
[facility] Face-to-Face Psychiatry Consult   Reason for Consult:  Consult for 60 year old man with a history of schizoaffective or bipolar disorder. Question about medication and thrombocytopenia Referring Physician: Posey Pronto Patient Identification: Brandon Maynard. MRN:  [id number] Principal Diagnosis: Acute delirium Diagnosis:   Patient Active Problem List   Diagnosis Date Noted  . Acute delirium [R41.0] 10/05/2016  . Left leg cellulitis [W65.681] 10/04/2016  . B12 deficiency [E53.8] 07/06/2016  . Anemia [D64.9] 06/16/2016  . Thrombocytopenia ([place]) [D69.6] 06/16/2016  . Weight loss [R63.4] 06/16/2016  . Esophageal reflux [K21.9] 05/12/2016  . 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 Schizoaffective disorder ([place]) [F25.9] 06/13/2014    Total Time spent with patient: 20 minutes  Subjective:   Brandon Maynard. is a 60 y.o. male patient admitted with "I'm okay".  Follow-up Wednesday the eighth. 60 year old man with history of bipolar or schizoaffective disorder. In the hospital with cellulitis and now developing some other altered mental status. Now in the critical care unit because of reported hypotension. Interviewed the patient. Spoke to intensive care unit nursing. Chart reviewed. Patient himself says he thinks he's feeling better today. He says his mood is feeling okay. Denies feeling manic denies depression denies suicidal ideation. Continues to say that he has some hallucinations but indicates that they don't really bother him. Doesn't seem to be any worse so far for having come off the Depakote.  HPI:  Patient interviewed. Chart reviewed. This is a 60 year old man with a history of chronic mental health problems variously diagnosed as bipolar or schizoaffective disorder. He is currently in the hospital primarily for a cellulitis on his left leg. Concern was raised about his thrombocytopenia getting worse. Patient has chronic  thrombocytopenia and has seen hematology about it. It sounds like there is reason to think that it may be at least in part related to medication toxicity. On interview this evening however I found the patient to have an abnormal mental status. He seems significantly more tired and withdrawn and confused than I had expected. Patient is unable to keep his eyes open, his voice is slurred and difficult to understand, he tends to ramble often have trouble answering questions directly. I eventually was able to engage him in some reasonable history. He has only partial insight into his current mental status. Minimizes the degree to which she has been feeling sick. Denies being suicidal or homicidal. Does say that his mood is been more down since his mother died earlier this year. Says that he has some trouble sleeping at night but it is not consistent. He denies having any visual hallucinations but seems to indicate at times that he does have auditory hallucinations that are still on and off. Denies any abuse of any drugs or alcohol.  Social history: Patient lives in a group home. Has been there for a long time. Sounds like his brother is his closest relative and most involved in overseeing his care. Mother died earlier this year. Patient indicates positive feelings towards his brother and remaining family.  Medical history: Patient has chronic thrombocytopenia that has been noted for years but it's significantly lowered this time than it had been on most previous draws. He also had low sodium on first admission but has not had severe hyponatremia in the past. History of chronic pain. History of gastric reflux. Currently in the hospital for a cellulitis.  Substance abuse history: He says that he abused drugs and alcohol in his  teens but not ever since then.  Past Psychiatric History: Long-standing psychiatric diagnosis. Looking back through his old chart I can't find any psychiatric hospitalizations in the past few  years. Have to go back to around 2011 or earlier for hospitalizations. At that time he was described as having a psychotic disorder. Patient denies any history of suicide attempts or violence. He does say that he is most troubled by psychotic symptoms and has had more mania than depression. The medicine combination of Depakote and Saphris and Prozac looks like it's been stable for years although the dose of Depakote has varied.  Risk to Self: Is patient at risk for suicide?: No Risk to Others:   Prior Inpatient Therapy:   Prior Outpatient Therapy:    Past Medical History:  Past Medical History:  Diagnosis Date  . Anemia   . Bipolar disorder ([place])   . Cancer ([place])   . Cataract    right eye  . DDD (degenerative disc disease)   . GERD (gastroesophageal reflux disease)   . Heart murmur   . Hyperlipidemia   . Hypertension   . Paranoid schizophrenia ([place])   . 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 Thyroid disease     Past Surgical History:  Procedure Laterality Date  . Dermal Abrasion  1973   Family History:  Family History  Problem Relation Age of Onset  . Brain cancer Maternal Uncle   . Breast cancer Mother   . Melanoma Mother   . Congestive Heart Failure Father   . Melanoma Father   . Breast cancer Maternal Aunt   . Diabetes Maternal Aunt    Family Psychiatric  History: Patient says he does not know of any family history of mental illness Social History:  History  Alcohol Use No     History  Drug Use No    Social History   Social History  . Marital status: Single    Spouse name: N/A  . Number of children: N/A  . Years of education: N/A   Social History Main Topics  . Smoking status: Current Every Day Smoker    Last attempt to quit: 04/09/1991  . Smokeless tobacco: Never Used  . Alcohol use No  . Drug use: No  . Sexual activity: Not Asked   Other Topics Concern  . None   Social History Narrative  . None   Additional Social History:    Allergies:    Allergies  Allergen Reactions  . Tetracyclines & Related Hives    Labs:  Results for orders placed or performed during the hospital encounter of 10/04/16 (from the past 48 hour(s))  Comprehensive metabolic panel     Status: Abnormal   Collection Time: 10/04/16  3:51 PM  Result Value Ref Range   Sodium 127 (L) 135 - 145 mmol/L   Potassium 4.4 3.5 - 5.1 mmol/L   Chloride 94 (L) 101 - 111 mmol/L   CO2 19 (L) 22 - 32 mmol/L   Glucose, Bld 99 65 - 99 mg/dL   BUN 85 (H) 6 - 20 mg/dL   Creatinine, Ser 4.33 (H) 0.61 - 1.24 mg/dL   Calcium 8.9 8.9 - 10.3 mg/dL   Total Protein 6.8 6.5 - 8.1 g/dL   Albumin 2.6 (L) 3.5 - 5.0 g/dL   AST 53 (H) 15 - 41 U/L   ALT 22 17 - 63 U/L   Alkaline Phosphatase 70 38 - 126 U/L   Total Bilirubin 0.8 0.3 - 1.2 mg/dL   GFR  calc non Af Amer 13 (L) >60 mL/min   GFR calc Af Amer 16 (L) >60 mL/min    Comment: (NOTE) The eGFR has been calculated using the CKD EPI equation. This calculation has not been validated in all clinical situations. eGFR's persistently <60 mL/min signify possible Chronic Kidney Disease.    Anion gap 14 5 - 15  CBC WITH DIFFERENTIAL     Status: Abnormal   Collection Time: 10/04/16  3:51 PM  Result Value Ref Range   WBC 12.7 (H) 3.8 - 10.6 K/uL   RBC 3.68 (L) 4.40 - 5.90 MIL/uL   Hemoglobin 11.4 (L) 13.0 - 18.0 g/dL   HCT 34.0 (L) 40.0 - 52.0 %   MCV 92.3 80.0 - 100.0 fL   MCH 31.1 26.0 - 34.0 pg   MCHC 33.7 32.0 - 36.0 g/dL   RDW 14.5 11.5 - 14.5 %   Platelets 42 (L) 150 - 440 K/uL   Neutrophils Relative % 75 %   Lymphocytes Relative 4 %   Monocytes Relative 6 %   Eosinophils Relative 0 %   Basophils Relative 0 %   Band Neutrophils 10 %   Metamyelocytes Relative 2 %   Myelocytes 3 %   Promyelocytes Absolute 0 %   Blasts 0 %   nRBC 0 0 /100 WBC   Other 0 %   Neutro Abs 11.4 (H) 1.4 - 6.5 K/uL   Lymphs Abs 0.5 (L) 1.0 - 3.6 K/uL   Monocytes Absolute 0.8 0.2 - 1.0 K/uL   Eosinophils Absolute 0.0 0 - 0.7 K/uL    Basophils Absolute 0.0 0 - 0.1 K/uL  Blood Culture (routine x 2)     Status: None (Preliminary result)   Collection Time: 10/04/16  3:51 PM  Result Value Ref Range   Specimen Description BLOOD RT AC    Special Requests BOTTLES DRAWN AEROBIC AND ANAEROBIC 10CC    Culture  Setup Time      Organism ID to follow GRAM NEGATIVE RODS IN BOTH AEROBIC AND ANAEROBIC BOTTLES CRITICAL RESULT CALLED TO, READ BACK BY AND VERIFIED WITH: NATE COOKSON ON 10/05/16 AT 0448 BY TLB CONFIRMED BY TLB    Culture GRAM NEGATIVE RODS    Report Status PENDING   Blood Culture ID Panel (Reflexed)     Status: Abnormal   Collection Time: 10/04/16  3:51 PM  Result Value Ref Range   Enterococcus species NOT DETECTED NOT DETECTED   Listeria monocytogenes NOT DETECTED NOT DETECTED   Staphylococcus species NOT DETECTED NOT DETECTED   Staphylococcus aureus NOT DETECTED NOT DETECTED   Streptococcus species NOT DETECTED NOT DETECTED   Streptococcus agalactiae NOT DETECTED NOT DETECTED   Streptococcus pneumoniae NOT DETECTED NOT DETECTED   Streptococcus pyogenes NOT DETECTED NOT DETECTED   Acinetobacter baumannii NOT DETECTED NOT DETECTED   Enterobacteriaceae species DETECTED (A) NOT DETECTED    Comment: CRITICAL RESULT CALLED TO, READ BACK BY AND VERIFIED WITH: NATE COOKSON ON 10/05/16 AT 0448 BY TLB    Enterobacter cloacae complex NOT DETECTED NOT DETECTED   Escherichia coli DETECTED (A) NOT DETECTED    Comment: CRITICAL RESULT CALLED TO, READ BACK BY AND VERIFIED WITH: NATE COOKSON ON 10/05/16 AT 0448 BY TLB  Klebsiella oxytoca NOT DETECTED NOT DETECTED   Klebsiella pneumoniae NOT DETECTED NOT DETECTED   Proteus species NOT DETECTED NOT DETECTED   Serratia marcescens NOT DETECTED NOT DETECTED   Carbapenem resistance NOT DETECTED NOT DETECTED   Haemophilus influenzae NOT DETECTED NOT DETECTED   Neisseria meningitidis NOT  DETECTED NOT DETECTED   Pseudomonas aeruginosa NOT DETECTED NOT DETECTED   Candida albicans NOT DETECTED NOT DETECTED   Candida glabrata NOT DETECTED NOT DETECTED   Candida krusei NOT DETECTED NOT DETECTED   Candida parapsilosis NOT DETECTED NOT DETECTED   Candida tropicalis NOT DETECTED NOT DETECTED  Lactic acid, plasma     Status: Abnormal   Collection Time: 10/04/16  3:52 PM  Result Value Ref Range   Lactic Acid, Venous 2.6 (HH) 0.5 - 1.9 mmol/L    Comment: CRITICAL RESULT CALLED TO, READ BACK BY AND VERIFIED WITH OLIVIA BROOMER 10/04/16 @ 1744  MLK   Type and screen [place]     Status: None   Collection Time: 10/04/16  3:52 PM  Result Value Ref Range   ABO/RH(D) O POS    Antibody Screen NEG    Sample Expiration 10/07/2016   Valproic acid level     Status: None   Collection Time: 10/04/16  3:54 PM  Result Value Ref Range   Valproic Acid Lvl 53 50.0 - 100.0 ug/mL  Blood Culture (routine x 2)     Status: None (Preliminary result)   Collection Time: 10/04/16  3:56 PM  Result Value Ref Range   Specimen Description BLOOD LT AC    Special Requests BOTTLES DRAWN AEROBIC AND ANAEROBIC 10CC    Culture  Setup Time      GRAM NEGATIVE RODS IN BOTH AEROBIC AND ANAEROBIC BOTTLES CRITICAL RESULT CALLED TO, READ BACK BY AND VERIFIED WITH: NATE COOKSON ON 10/05/16 AT 0448 BY TLB CONFIRMED BY TLB    Culture GRAM NEGATIVE RODS    Report Status PENDING   Lactic acid, plasma     Status: None   Collection Time: 10/04/16  6:52 PM  Result Value Ref Range   Lactic Acid, Venous 1.7 0.5 - 1.9 mmol/L  Urinalysis complete, with microscopic ([facility] only)     Status: Abnormal   Collection Time: 10/04/16  9:01 PM  Result Value Ref Range   Color, Urine YELLOW (A) YELLOW   APPearance HAZY (A) CLEAR   Glucose, UA NEGATIVE NEGATIVE mg/dL   Bilirubin Urine NEGATIVE NEGATIVE   Ketones, ur NEGATIVE NEGATIVE mg/dL   Specific Gravity, Urine 1.005 1.005 - 1.030   Hgb urine dipstick  2+ (A) NEGATIVE   pH 6.0 5.0 - 8.0   Protein, ur 100 (A) NEGATIVE mg/dL   Nitrite NEGATIVE NEGATIVE   Leukocytes, UA 3+ (A) NEGATIVE   RBC / HPF 0-5 0 - 5 RBC/hpf   WBC, UA TOO NUMEROUS TO COUNT 0 - 5 WBC/hpf   Bacteria, UA RARE (A) NONE SEEN   Squamous Epithelial / LPF 0-5 (A) NONE SEEN   WBC Clumps PRESENT   MRSA PCR Screening     Status: None   Collection Time: 10/04/16  9:01 PM  Result Value Ref Range   MRSA by PCR NEGATIVE NEGATIVE    Comment:        The GeneXpert MRSA Assay (FDA approved for NASAL specimens only), is one component of a comprehensive MRSA colonization surveillance program. It is not intended to diagnose MRSA infection nor to guide or monitor treatment for MRSA infections.  Comprehensive metabolic panel     Status: Abnormal   Collection Time: 10/05/16  4:06 AM  Result Value Ref Range   Sodium 130 (L) 135 - 145 mmol/L   Potassium 4.2 3.5 - 5.1 mmol/L   Chloride 101 101 - 111 mmol/L   CO2 18 (L) 22 - 32 mmol/L   Glucose, Bld 90 65 - 99 mg/dL   BUN 96 (H) 6 - 20 mg/dL   Creatinine, Ser 4.53 (H) 0.61 - 1.24 mg/dL   Calcium 8.1 (L) 8.9 - 10.3 mg/dL   Total Protein 5.0 (L) 6.5 - 8.1 g/dL   Albumin 1.8 (L) 3.5 - 5.0 g/dL   AST 59 (H) 15 - 41 U/L   ALT 22 17 - 63 U/L   Alkaline Phosphatase 43 38 - 126 U/L   Total Bilirubin 0.6 0.3 - 1.2 mg/dL   GFR calc non Af Amer 13 (L) >60 mL/min   GFR calc Af Amer 15 (L) >60 mL/min    Comment: (NOTE) The eGFR has been calculated using the CKD EPI equation. This calculation has not been validated in all clinical situations. eGFR's persistently <60 mL/min signify possible Chronic Kidney Disease.    Anion gap 11 5 - 15  CBC     Status: Abnormal   Collection Time: 10/05/16  4:06 AM  Result Value Ref Range   WBC 13.1 (H) 3.8 - 10.6 K/uL   RBC 2.93 (L) 4.40 - 5.90 MIL/uL   Hemoglobin 9.0 (L) 13.0 - 18.0 g/dL   HCT 26.4 (L) 40.0 - 52.0 %   MCV 89.8 80.0 - 100.0 fL   MCH 30.7 26.0 - 34.0 pg   MCHC 34.2 32.0 - 36.0  g/dL   RDW 14.8 (H) 11.5 - 14.5 %   Platelets 18 (LL) 150 - 440 K/uL    Comment: PLATELET COUNT CONFIRMED BY SMEAR CRITICAL RESULT CALLED TO, READ BACK BY AND VERIFIED WITH: ERWIN MACROHON ON 10/05/16 AT 3159 BY TLB   Protime-INR     Status: None   Collection Time: 10/05/16  4:06 AM  Result Value Ref Range   Prothrombin Time 15.2 11.4 - 15.2 seconds   INR 1.19   Protein / creatinine ratio, urine     Status: Abnormal   Collection Time: 10/05/16  2:11 PM  Result Value Ref Range   Creatinine, Urine 29 mg/dL   Total Protein, Urine 25 mg/dL    Comment: NO NORMAL RANGE ESTABLISHED FOR THIS TEST   Protein Creatinine Ratio 0.86 (H) 0.00 - 0.15 mg/mg[Cre]    Current Facility-Administered Medications  Medication Dose Route Frequency Provider Last Rate Last Dose  . 0.9 %  sodium chloride infusion   Intravenous Continuous Munsoor Lateef, MD 50 mL/hr at 10/05/16 1427    . acetaminophen (TYLENOL) tablet 650 mg  650 mg Oral Q6H PRN Fritzi Mandes, MD      . asenapine (SAPHRIS) sublingual tablet 10 mg  10 mg Sublingual QHS Fritzi Mandes, MD      . asenapine (SAPHRIS) sublingual tablet 5 mg  5 mg Sublingual q morning - 10a Fritzi Mandes, MD   5 mg at 10/05/16 1343  . brimonidine (ALPHAGAN) 0.2 % ophthalmic solution 1 drop  1 drop Both Eyes Q12H Aruna Gouru, MD   1 drop at 10/05/16 0733   And  . timolol (TIMOPTIC) 0.5 % ophthalmic solution 1 drop  1 drop Both Eyes Q12H Nicholes Mango, MD   1 drop at 10/05/16 0733  . docusate sodium (COLACE) capsule 100  mg  100 mg Oral BID Nicholes Mango, MD   100 mg at 10/05/16 0733  . FLUoxetine (PROZAC) capsule 40 mg  40 mg Oral Daily Nicholes Mango, MD   40 mg at 10/05/16 0733  . latanoprost (XALATAN) 0.005 % ophthalmic solution 1 drop  1 drop Both Eyes QHS Nicholes Mango, MD   1 drop at 10/04/16 2212  . levothyroxine (SYNTHROID, LEVOTHROID) tablet 50 mcg  50 mcg Oral QAC breakfast Nicholes Mango, MD   50 mcg at 10/04/16 2208  . meropenem (MERREM) IVPB SOLR 500 mg  500 mg Intravenous BID  Nicholes Mango, MD   500 mg at 10/05/16 0529  . multivitamin with minerals tablet 1 tablet  1 tablet Oral Daily Nicholes Mango, MD   1 tablet at 10/05/16 0733  . ondansetron (ZOFRAN) tablet 4 mg  4 mg Oral Q6H PRN Nicholes Mango, MD       Or  . ondansetron (ZOFRAN) injection 4 mg  4 mg Intravenous Q6H PRN Nicholes Mango, MD      . oxyCODONE (Oxy IR/ROXICODONE) immediate release tablet 5 mg  5 mg Oral Q6H PRN Fritzi Mandes, MD      . pyridOXINE (VITAMIN B-6) tablet 100 mg  100 mg Oral BID Nicholes Mango, MD   100 mg at 10/05/16 0733  . simvastatin (ZOCOR) tablet 20 mg  20 mg Oral QHS Nicholes Mango, MD   20 mg at 10/04/16 2208  . traZODone (DESYREL) tablet 100 mg  100 mg Oral QHS Nicholes Mango, MD   100 mg at 10/04/16 2208   Facility-Administered Medications Ordered in Other Encounters  Medication Dose Route Frequency Provider Last Rate Last Dose  . cyanocobalamin ((VITAMIN B-12)) injection 1,000 mcg  1,000 mcg Intramuscular Once Lequita Asal, MD        Musculoskeletal: Strength & Muscle Tone: decreased Gait & Station: unable to stand Patient leans: Backward  Psychiatric Specialty Exam: Physical Exam  Nursing note and vitals reviewed. Constitutional: He appears well-developed and well-nourished.  HENT:  Head: Normocephalic and atraumatic.  Eyes: Conjunctivae are normal. Pupils are equal, round, and reactive to light.  Neck: Normal range of motion.  Cardiovascular: Normal heart sounds.   Respiratory: Effort normal.  GI: Soft.  Musculoskeletal: Normal range of motion.  Neurological: He is alert.  Skin: Skin is warm and dry.  Psychiatric: His affect is blunt. His speech is delayed. He is slowed and withdrawn. He expresses no homicidal and no suicidal ideation. He exhibits abnormal recent memory and abnormal remote memory. He is inattentive.    Review of Systems  Constitutional: Negative.   HENT: Negative.   Eyes: Negative.   Respiratory: Negative.   Cardiovascular: Negative.   Gastrointestinal:  Negative.   Musculoskeletal: Negative.   Skin: Negative.   Neurological: Negative.   Psychiatric/Behavioral: Negative for depression, hallucinations, memory loss, substance abuse and suicidal ideas. The patient is not nervous/anxious and does not have insomnia.     Blood pressure (!) 91/54, pulse 82, temperature 98.5 F (36.9 C), temperature source Oral, resp. rate 18, height 6' 3"  (1.905 m), weight 83.9 kg (185 lb), SpO2 94 %.Body mass index is 23.12 kg/m.  General Appearance: Casual  Eye Contact:  Minimal  Speech:  Garbled and Slow  Volume:  Decreased  Mood:  Euthymic  Affect:  Looks very tired and withdrawn.  Thought Process:  Disorganized  Orientation:  Full (Time, Place, and Person)  Thought Content:  Illogical, Rumination and Tangential  Suicidal Thoughts:  No  Homicidal Thoughts:  No  Memory:  Immediate;   Fair Recent;   Poor Remote;   Poor  Judgement:  Impaired  Insight:  Shallow  Psychomotor Activity:  Decreased  Concentration:  Concentration: Fair  Recall:  [employer] of Knowledge:  Fair  Language:  Fair  Akathisia:  No  Handed:  Right  AIMS (if indicated):     Assets:  Financial Resources/Insurance Housing Social Support  ADL's:  Impaired  Cognition:  Impaired,  Mild  Sleep:        Treatment Plan Summary: Daily contact with patient to assess and evaluate symptoms and progress in treatment, Medication management and Plan    Disposition: Daily contact with patient to assess and evaluate symptoms and progress in treatment, Medication management and continue off the Depakote for now. No other adjustment to psychiatric medicine. Seems to be tolerating it so far. I reviewed with the patient the reason for stopping the Depakote. It looks like his platelets are a little bit better today although I don't think we can necessarily assume that that's caused by stopping the valproate. I will continue to follow up while he is in the hospital.  Alethia Berthold, MD 10/05/2016  5:58 PM

## 2016-10-07 DIAGNOSIS — I9589 Other hypotension: Secondary | ICD-10-CM

## 2016-10-07 DIAGNOSIS — K219 Gastro-esophageal reflux disease without esophagitis: Secondary | ICD-10-CM

## 2016-10-07 DIAGNOSIS — E079 Disorder of thyroid, unspecified: Secondary | ICD-10-CM

## 2016-10-07 DIAGNOSIS — D72829 Elevated white blood cell count, unspecified: Secondary | ICD-10-CM

## 2016-10-07 DIAGNOSIS — N189 Chronic kidney disease, unspecified: Secondary | ICD-10-CM

## 2016-10-07 DIAGNOSIS — R634 Abnormal weight loss: Secondary | ICD-10-CM

## 2016-10-07 DIAGNOSIS — F17210 Nicotine dependence, cigarettes, uncomplicated: Secondary | ICD-10-CM

## 2016-10-07 DIAGNOSIS — D693 Immune thrombocytopenic purpura: Secondary | ICD-10-CM

## 2016-10-07 DIAGNOSIS — I129 Hypertensive chronic kidney disease with stage 1 through stage 4 chronic kidney disease, or unspecified chronic kidney disease: Secondary | ICD-10-CM

## 2016-10-07 DIAGNOSIS — A4189 Other specified sepsis: Secondary | ICD-10-CM

## 2016-10-07 DIAGNOSIS — H409 Unspecified glaucoma: Secondary | ICD-10-CM

## 2016-10-07 DIAGNOSIS — L03116 Cellulitis of left lower limb: Secondary | ICD-10-CM

## 2016-10-07 DIAGNOSIS — F200 Paranoid schizophrenia: Secondary | ICD-10-CM

## 2016-10-07 DIAGNOSIS — Z79899 Other long term (current) drug therapy: Secondary | ICD-10-CM

## 2016-10-07 LAB — CULTURE, BLOOD (ROUTINE X 2)

## 2016-10-07 LAB — RENAL FUNCTION PANEL
ALBUMIN: 1.6 g/dL — AB (ref 3.5–5.0)
ANION GAP: 9 (ref 5–15)
BUN: 107 mg/dL — ABNORMAL HIGH (ref 6–20)
CHLORIDE: 109 mmol/L (ref 101–111)
CO2: 19 mmol/L — ABNORMAL LOW (ref 22–32)
Calcium: 8.3 mg/dL — ABNORMAL LOW (ref 8.9–10.3)
Creatinine, Ser: 5.11 mg/dL — ABNORMAL HIGH (ref 0.61–1.24)
GFR, EST AFRICAN AMERICAN: 13 mL/min — AB (ref >60)
GFR, EST NON AFRICAN AMERICAN: 11 mL/min — AB (ref >60)
Glucose, Bld: 96 mg/dL (ref 65–99)
PHOSPHORUS: 3.7 mg/dL (ref 2.5–4.6)
POTASSIUM: 3.7 mmol/L (ref 3.5–5.1)
Sodium: 137 mmol/L (ref 135–145)

## 2016-10-07 LAB — CBC
HCT: 30.2 % — ABNORMAL LOW (ref 40.0–52.0)
Hemoglobin: 10.1 g/dL — ABNORMAL LOW (ref 13.0–18.0)
MCH: 31 pg (ref 26.0–34.0)
MCHC: 33.3 g/dL (ref 32.0–36.0)
MCV: 93 fL (ref 80.0–100.0)
Platelets: 33 10*3/uL — ABNORMAL LOW (ref 150–440)
RBC: 3.24 MIL/uL — ABNORMAL LOW (ref 4.40–5.90)
RDW: 15.6 % — ABNORMAL HIGH (ref 11.5–14.5)
WBC: 24.5 10*3/uL — ABNORMAL HIGH (ref 3.8–10.6)

## 2016-10-07 LAB — PROTEIN ELECTRO, RANDOM URINE
ALPHA-1-GLOBULIN, U: 6.5 %
ALPHA-2-GLOBULIN, U: 15.9 %
Albumin ELP, Urine: 33.6 %
Beta Globulin, U: 22.6 %
Gamma Globulin, U: 21.3 %
PDF: 0
TOTAL PROTEIN, URINE-UPE24: 15.2 mg/dL

## 2016-10-07 LAB — URINE CULTURE: Culture: 30000 — AB

## 2016-10-07 MED ORDER — DEXTROSE 5 % IV SOLN: 2.0000 g | INTRAVENOUS | Status: DC

## 2016-10-07 MED ORDER — HALOPERIDOL LACTATE 5 MG/ML IJ SOLN
2.0000 mg | Freq: Four times a day (QID) | INTRAMUSCULAR | Status: DC | PRN
  Administered 2016-10-09 – 2016-10-14 (×3): 2 mg via INTRAVENOUS
  Filled 2016-10-07 (×3): qty 1

## 2016-10-07 MED ORDER — CEFTRIAXONE SODIUM-DEXTROSE 2-2.22 GM-% IV SOLR
2.0000 g | INTRAVENOUS | Status: DC
  Administered 2016-10-07: 2 g via INTRAVENOUS
  Filled 2016-10-07 (×2): qty 50

## 2016-10-07 NOTE — Progress Notes (Addendum)
Pt recently arrived from ICU.  Large blister to the top of the left foot.  Small blister to right/top of foot.  Dr Posey Pronto gave permission to make a small incision allowing contents of blister to run out.  Clear yellow excretion with some milky white excretion. Order given to discontinue telemetry

## 2016-10-07 NOTE — Progress Notes (Addendum)
Dr. Weber Cooks at bedside has assessed pt and inquired regarding pt's decision making capacity, pt is oriented and understands that if he does not get dialysis that his kidney failure will get worse or even death, pt refuses at this time to get dialysis, he states that since his mother lived long without any machines that his body will also recover on its on. Dr Holley Raring and Dr Stevenson Clinch notified of pt's decision about not getting a catheter and no dialysis for right now. Will reassess tomorrow. Brother in [place] has been called and updated on pt's decisions of not getting dialysis at this time, brother understands that he cannot override pt's decision for now. Will continue to monitor.

## 2016-10-07 NOTE — Progress Notes (Signed)
Dr. Weber Cooks note reviewed.  At this moment in time it appears that the patient has capacity to make his own decisions. He has declined hemodialysis at this point in time. Therefore we will hold off on dialysis. Patient appears to understand the ramifications of declining dialysis including risk of death.

## 2016-10-07 NOTE — Progress Notes (Signed)
[facility]  ROUNDING NOTE   Subjective:  Patient seen at bedside. Renal function testing pending this a.m. Urine output was only 635 mL over the preceding 24 hours.    Objective:  Vital signs in last 24 hours:  Temp:  [97.6 F (36.4 C)-98.1 F (36.7 C)] 97.8 F (36.6 C) (11/09 0800) Pulse Rate:  [73-85] 73 (11/09 0700) Resp:  [13-20] 14 (11/09 0700) BP: (94-114)/(45-95) 111/90 (11/09 0800) SpO2:  [94 %-100 %] 100 % (11/09 0700) Weight:  [87.5 kg (192 lb 14.4 oz)] 87.5 kg (192 lb 14.4 oz) (11/08 1200)  Weight change:  Filed Weights   10/04/16 1500 10/04/16 2025 10/06/16 1200  Weight: 83.9 kg (185 lb) 83.9 kg (185 lb) 87.5 kg (192 lb 14.4 oz)    Intake/Output: I/O last 3 completed shifts: In: 1745.5 [P.O.:718; I.V.:977.5; IV Piggyback:50] Out: 935 [Urine:935]   Intake/Output this shift:  Total I/O In: 940 [P.O.:240; I.V.:700] Out: -   Physical Exam: General: Laying in bed, confused  Head: Normocephalic, atraumatic. Moist oral mucosal membranes  Eyes: Anicteric  Neck: Supple, trachea midline  Lungs:  Clear to auscultation, normal effort  Heart: S1S2 no rubs  Abdomen:  Soft, nontender, BS present  Extremities: 2+ LLE, significant cellulitis of the LLE, bullous lesion on left foot  Neurologic: Awake but confused.  Skin: Bullous overlying left foot       Basic Metabolic Panel:  Recent Labs Lab 10/04/16 1551 10/05/16 0406 10/05/16 1824 10/06/16 0430  NA 127* 130*  --  134*  K 4.4 4.2  --  4.0  CL 94* 101  --  106  CO2 19* 18*  --  18*  GLUCOSE 99 90  --  116*  BUN 85* 96*  --  104*  CREATININE 4.33* 4.53*  --  4.66*  CALCIUM 8.9 8.1*  --  7.9*  PHOS  --   --  5.0*  --     Liver Function Tests:  Recent Labs Lab 10/04/16 1551 10/05/16 0406  AST 53* 59*  ALT 22 22  ALKPHOS 70 43  BILITOT 0.8 0.6  PROT 6.8 5.0*  ALBUMIN 2.6* 1.8*   No results for input(s): LIPASE, AMYLASE in the last 168 hours.  Recent Labs Lab 10/06/16 0430   AMMONIA 29    CBC:  Recent Labs Lab 10/04/16 1551 10/05/16 0406 10/06/16 0430 10/07/16 0713  WBC 12.7* 13.1* 19.4* 24.5*  NEUTROABS 11.4*  --   --   --   HGB 11.4* 9.0* 9.5* 10.1*  HCT 34.0* 26.4* 28.2* 30.2*  MCV 92.3 89.8 91.8 93.0  PLT 42* 18* 22* 33*    Cardiac Enzymes: No results for input(s): CKTOTAL, CKMB, CKMBINDEX, TROPONINI in the last 168 hours.  BNP: Invalid input(s): POCBNP  CBG:  Recent Labs Lab 10/06/16 1153  GLUCAP 107*    Microbiology: Results for orders placed or performed during the hospital encounter of 10/04/16  Blood Culture (routine x 2)     Status: Abnormal   Collection Time: 10/04/16  3:51 PM  Result Value Ref Range Status   Specimen Description BLOOD RT [facility]  Final   Special Requests BOTTLES DRAWN AEROBIC AND ANAEROBIC 10CC  Final   Culture  Setup Time   Final    Organism ID to follow GRAM NEGATIVE RODS IN BOTH AEROBIC AND ANAEROBIC BOTTLES CRITICAL RESULT CALLED TO, READ BACK BY AND VERIFIED WITH: NATE COOKSON ON 10/05/16 AT 0448 BY TLB CONFIRMED BY TLB    Culture ESCHERICHIA COLI (A)  Final   Report Status 10/07/2016 FINAL  Final   Organism ID, Bacteria ESCHERICHIA COLI  Final      Susceptibility   Escherichia coli - MIC*    AMPICILLIN >=32 RESISTANT Resistant     CEFAZOLIN <=4 SENSITIVE Sensitive     CEFEPIME <=1 SENSITIVE Sensitive     CEFTAZIDIME <=1 SENSITIVE Sensitive     CEFTRIAXONE <=1 SENSITIVE Sensitive     CIPROFLOXACIN <=0.25 SENSITIVE Sensitive     GENTAMICIN <=1 SENSITIVE Sensitive     IMIPENEM <=0.25 SENSITIVE Sensitive     TRIMETH/SULFA >=320 RESISTANT Resistant     AMPICILLIN/SULBACTAM 16 INTERMEDIATE Intermediate     PIP/TAZO <=4 SENSITIVE Sensitive     Extended ESBL NEGATIVE Sensitive     * ESCHERICHIA COLI  Blood Culture ID Panel (Reflexed)     Status: Abnormal   Collection Time: 10/04/16  3:51 PM  Result Value Ref Range Status   Enterococcus species NOT DETECTED NOT DETECTED Final   Listeria  monocytogenes NOT DETECTED NOT DETECTED Final   Staphylococcus species NOT DETECTED NOT DETECTED Final   Staphylococcus aureus NOT DETECTED NOT DETECTED Final   Streptococcus species NOT DETECTED NOT DETECTED Final   Streptococcus agalactiae NOT DETECTED NOT DETECTED Final   Streptococcus pneumoniae NOT DETECTED NOT DETECTED Final   Streptococcus pyogenes NOT DETECTED NOT DETECTED Final   Acinetobacter baumannii NOT DETECTED NOT DETECTED Final   Enterobacteriaceae species DETECTED (A) NOT DETECTED Final    Comment: CRITICAL RESULT CALLED TO, READ BACK BY AND VERIFIED WITH: NATE COOKSON ON 10/05/16 AT 0448 BY TLB    Enterobacter cloacae complex NOT DETECTED NOT DETECTED Final   Escherichia coli DETECTED (A) NOT DETECTED Final    Comment: CRITICAL RESULT CALLED TO, READ BACK BY AND VERIFIED WITH: NATE COOKSON ON 10/05/16 AT 0448 BY TLB                                                                                                Klebsiella oxytoca NOT DETECTED NOT DETECTED Final   Klebsiella pneumoniae NOT DETECTED NOT DETECTED Final   Proteus species NOT DETECTED NOT DETECTED Final   Serratia marcescens NOT DETECTED NOT DETECTED Final   Carbapenem resistance NOT DETECTED NOT DETECTED Final   Haemophilus influenzae NOT DETECTED NOT DETECTED Final   Neisseria meningitidis NOT DETECTED NOT DETECTED Final   Pseudomonas aeruginosa NOT DETECTED NOT DETECTED Final   Candida albicans NOT DETECTED NOT DETECTED Final   Candida glabrata NOT DETECTED NOT DETECTED Final   Candida krusei NOT DETECTED NOT DETECTED Final   Candida parapsilosis NOT DETECTED NOT DETECTED Final   Candida tropicalis NOT DETECTED NOT DETECTED Final  Blood Culture (routine x 2)     Status: Abnormal   Collection Time: 10/04/16  3:56 PM  Result Value Ref Range Status   Specimen Description BLOOD LT AC  Final   Special Requests BOTTLES DRAWN AEROBIC AND ANAEROBIC 10CC  Final   Culture  Setup Time   Final    GRAM NEGATIVE  RODS IN BOTH AEROBIC AND ANAEROBIC BOTTLES CRITICAL RESULT CALLED TO, READ BACK BY AND  VERIFIED WITH: NATE COOKSON ON 10/05/16 AT 0448 BY TLB CONFIRMED BY TLB    Culture ESCHERICHIA COLI (A)  Final   Report Status 10/07/2016 FINAL  Final  Urine culture     Status: Abnormal   Collection Time: 10/04/16  9:01 PM  Result Value Ref Range Status   Specimen Description URINE, RANDOM  Final   Special Requests NONE  Final   Culture 30,000 COLONIES/mL ESCHERICHIA COLI (A)  Final   Report Status 10/07/2016 FINAL  Final   Organism ID, Bacteria ESCHERICHIA COLI (A)  Final      Susceptibility   Escherichia coli - MIC*    AMPICILLIN >=32 RESISTANT Resistant     CEFAZOLIN <=4 SENSITIVE Sensitive     CEFTRIAXONE <=1 SENSITIVE Sensitive     CIPROFLOXACIN <=0.25 SENSITIVE Sensitive     GENTAMICIN <=1 SENSITIVE Sensitive     IMIPENEM <=0.25 SENSITIVE Sensitive     NITROFURANTOIN <=16 SENSITIVE Sensitive     TRIMETH/SULFA >=320 RESISTANT Resistant     AMPICILLIN/SULBACTAM 16 INTERMEDIATE Intermediate     PIP/TAZO <=4 SENSITIVE Sensitive     Extended ESBL NEGATIVE Sensitive     * 30,000 COLONIES/mL ESCHERICHIA COLI  MRSA PCR Screening     Status: None   Collection Time: 10/04/16  9:01 PM  Result Value Ref Range Status   MRSA by PCR NEGATIVE NEGATIVE Final    Comment:        The GeneXpert MRSA Assay (FDA approved for NASAL specimens only), is one component of a comprehensive MRSA colonization surveillance program. It is not intended to diagnose MRSA infection nor to guide or monitor treatment for MRSA infections.   MRSA PCR Screening     Status: None   Collection Time: 10/06/16 11:59 AM  Result Value Ref Range Status   MRSA by PCR NEGATIVE NEGATIVE Final    Comment:        The GeneXpert MRSA Assay (FDA approved for NASAL specimens only), is one component of a comprehensive MRSA colonization surveillance program. It is not intended to diagnose MRSA infection nor to guide or monitor  treatment for MRSA infections.     Coagulation Studies:  Recent Labs  10/05/16 0406  LABPROT 15.2  INR 1.19    Urinalysis:  Recent Labs  10/04/16 2101  COLORURINE YELLOW*  LABSPEC 1.005  PHURINE 6.0  GLUCOSEU NEGATIVE  HGBUR 2+*  BILIRUBINUR NEGATIVE  KETONESUR NEGATIVE  PROTEINUR 100*  NITRITE NEGATIVE  LEUKOCYTESUR 3+*      Imaging: US Renal  Result Date: 10/05/2016 CLINICAL DATA:  Acute renal failure EXAM: RENAL / URINARY TRACT ULTRASOUND COMPLETE COMPARISON:  09/01/2016, 06/28/2016 FINDINGS: Right Kidney: Length: 13.6 cm. Increased cortical echogenicity. 1.3 x 1.2 x 1.1 cm cyst at the midpole. No hydronephrosis. Left Kidney: Length: 14 cm.  Increased cortical echogenicity.  No hydronephrosis. Bladder: Increased echogenicity along the dependent portion could relate to debris or possible wall thickening. IMPRESSION: 1. Increased cortical echogenicity bilaterally, consistent with medical renal disease. No hydronephrosis. 2. Increased echogenicity along the dependent portion of the bladder, this could represent bladder debris versus wall thickening. Electronically Signed   By: Donavan Foil M.D.   On: 10/05/2016 16:42   Ct Tibia Fibula Left Wo Contrast  Result Date: 10/07/2016 CLINICAL DATA:  Left lower extremity pain, swelling and redness with a large blister on the dorsal aspect of the left foot of unknown duration. Elevated white blood cell count and hypotension. EXAM: CT TIBIA FIBULA LEFT WITHOUT CONTRAST TECHNIQUE: Multidetector CT imaging  was performed according to the standard protocol. Multiplanar CT image reconstructions were also generated. COMPARISON:  None. FINDINGS: Imaged bones appear normal without destructive change or periosteal reaction. There is no fracture or dislocation. No evidence of arthropathy. Very small bone island in the medial talus is noted. Subcutaneous edema diffusely about the lower leg increases in intensity about the ankle and foot. A large  blister type lesion in the cutaneous tissues over the dorsum of the foot measures 5.1 cm transverse by 1.5 cm craniocaudal by approximately 6.5 cm long and is centered over the second, third and fourth metatarsals. No septations or gas within the blister are identified. A second much smaller blister measuring 0.7 cm transverse by 0.3 cm craniocaudal by approximately 2.0 cm long is seen centered over the medial cuneiform in the dorsal soft tissues. No soft tissue gas including gas dissecting within fascial planes is seen. Fat planes within muscle are preserved. IMPRESSION: Subcutaneous edema about the left lower leg increases in intensity distally and is most consistent with cellulitis given the patient's history. 2 cutaneous fluid collections over the dorsum of the foot are consistent with blisters as described in the patient's history and could be septic or aseptic. Negative for evidence of myositis, fasciitis or osteomyelitis. Electronically Signed   By: Inge Rise M.D.   On: 10/07/2016 07:21     Medications:   . sodium chloride 50 mL/hr at 10/07/16 0230  . norepinephrine Stopped (10/06/16 1145)   . asenapine  10 mg Sublingual QHS  . asenapine  5 mg Sublingual q morning - 10a  . brimonidine  1 drop Both Eyes Q12H   And  . timolol  1 drop Both Eyes Q12H  . docusate sodium  100 mg Oral BID  . FLUoxetine  40 mg Oral Daily  . latanoprost  1 drop Both Eyes QHS  . levothyroxine  50 mcg Oral QAC breakfast  . meropenem  500 mg Intravenous BID  . multivitamin with minerals  1 tablet Oral Daily  . pyridOXINE  100 mg Oral BID  . simvastatin  20 mg Oral QHS  . traZODone  100 mg Oral QHS   acetaminophen, ondansetron **OR** ondansetron (ZOFRAN) IV, oxyCODONE  Assessment/ Plan:  60 y.o. male with a PMHx of anemia, bipolar disorder, degenerative disc disease, GERD, hyperlipidemia, hypertension, paranoid schizophrenia, who was admitted to [facility] on 10/04/2016 for evaluation of left leg swelling and  redness.   1.  Acute renal failure/CKD stage III baseline Cr 2.1 egfr 32/proteinuria:  Patient admitted with left lower external a cellulitis as well as hypotension.  Suspect that acute renal failure is related to hypotension and subsequent ischemic changes.  - Urine output overall remains low. New renal function testing pending. If renal function similar to yesterday we would recommend a course of dialysis.  This was discussed with the patient's brother who consents to proceeding with dialysis if needed.  It does not appear that the patient can make his own decisions at the moment.  2.  Hypotension.   blood pressure has improved a bit to 111/90. Continue to monitor blood pressure trend.  Norepinephrine stopped.  3.  Left lower extremity cellulitis.  Seems to be improving. Still has a large bolus over the left foot.  Patient maintained on meropenem.   LOS: 3 Daneli Butkiewicz 11/9/20179:36 AM

## 2016-10-07 NOTE — Consult Note (Signed)
[facility] Consult   Reason for Consult:  Follow-up consult for 60 year old man with a history of chronic mental illness currently in the hospital with acute and complicated medical problems stemming from probably sepsis from a cellulitis and labile blood pressures. Updated problem as of today he is assessing capacity for decision-making Referring Physician:  Patel/Lateef Patient Identification: Brandon T Brouwer Jr. MRN:  [id number] Principal Diagnosis: Acute delirium Diagnosis:   Patient Active Problem List   Diagnosis Date Noted  . Acute delirium [R41.0] 10/05/2016  . Left leg cellulitis [O29.476] 10/04/2016  . B12 deficiency [E53.8] 07/06/2016  . Anemia [D64.9] 06/16/2016  . Thrombocytopenia ([place]) [D69.6] 06/16/2016  . Weight loss [R63.4] 06/16/2016  . Esophageal reflux [K21.9] 05/12/2016  . 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 Schizoaffective disorder ([place]) [F25.9] 06/13/2014    Total Time spent with patient: 45 minutes  Subjective:   Brandon Maynard. is a 60 y.o. male patient admitted with "I'm not feeling good".  HPI:  Patient interviewed. Chart reviewed. Case discussed with on duty nursing staff in the intensive care unit and also with the nephrology consultant Dr. Zollie Scale. Patient has continued to have a worsening of his indices for kidney function. Nephrology feels that he needs to start dialysis at this point. They raise the question as to his capacity because he had voiced opposition to the plan earlier today. On interview today the patient knows that he is in a hospital. He knows that he has had infections and problems with his feet. He discussed dialysis. Patient was able to repeat back to me in his own words what dialysis was, that it was a filtering of his blood through a catheter. He expressed on his own and understanding that the worst case outcome of not getting dialysis is death. I impressed on  him that this can happen quickly and that is why nephrology would like to proceed as soon as possible. Patient states that he does not want to die but that he holds out a belief that his body will recover on its own. He holds out as an example of this that his mother lived to be very elderly patient states that he wouldn't refuse the procedure forever but that he doesn't feel that he wants to do it right now. I again impressed upon him that his condition could worsen quickly and that if he delays getting dialysis he increases his risk of death or permanent kidney function. Patient acknowledged this.  Additionally patient has been agitated this morning. Agitated with nursing trying to help them. Has not pulled out catheters or been obviously aggressive or self harming    Past Psychiatric History: Chronic schizophrenia or schizoaffective disorder. Recently we have discontinued his Depakote out of concern for it worsening his thrombocytopenia and medical problems. He is still on his antipsychotic.  Risk to Self: Is patient at risk for suicide?: No Risk to Others:   Prior Inpatient Therapy:   Prior Outpatient Therapy:    Past Medical History:  Past Medical History:  Diagnosis Date  . Anemia   . Bipolar disorder ([place])   . Cancer ([place])   . Cataract    right eye  . DDD (degenerative disc disease)   . GERD (gastroesophageal reflux disease)   . Heart murmur   . Hyperlipidemia   . Hypertension   . Paranoid schizophrenia ([place])   . 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 Thyroid disease  Past Surgical History:  Procedure Laterality Date  . Dermal Abrasion  1973   Family History:  Family History  Problem Relation Age of Onset  . Brain cancer Maternal Uncle   . Breast cancer Mother   . Melanoma Mother   . Congestive Heart Failure Father   . Melanoma Father   . Breast cancer Maternal Aunt   . Diabetes Maternal Aunt    Family Psychiatric  History: No family history identified. Social  History:  History  Alcohol Use No     History  Drug Use No    Social History   Social History  . Marital status: Single    Spouse name: N/A  . Number of children: N/A  . Years of education: N/A   Social History Main Topics  . Smoking status: Current Every Day Smoker    Last attempt to quit: 04/09/1991  . Smokeless tobacco: Never Used  . Alcohol use No  . Drug use: No  . Sexual activity: Not Asked   Other Topics Concern  . None   Social History Narrative  . None   Additional Social History:Patient's brother, who lives in [place], is the only relative actively involved in making decisions. I am told by nephrology that the brother has been contacted and expresses agreement with proceeding with dialysis. Unfortunately he has not been made the healthcare power of attorney or guardian at this point.    Allergies:   Allergies  Allergen Reactions  . Tetracyclines & Related Hives    Labs:  Results for orders placed or performed during the hospital encounter of 10/04/16 (from the past 48 hour(s))  Protein / creatinine ratio, urine     Status: Abnormal   Collection Time: 10/05/16  2:11 PM  Result Value Ref Range   Creatinine, Urine 29 mg/dL   Total Protein, Urine 25 mg/dL    Comment: NO NORMAL RANGE ESTABLISHED FOR THIS TEST   Protein Creatinine Ratio 0.86 (H) 0.00 - 0.15 mg/mg[Cre]  ANA w/Reflex if Positive     Status: None   Collection Time: 10/05/16  6:24 PM  Result Value Ref Range   Anit Nuclear Antibody(ANA) Negative Negative    Comment: (NOTE) Performed At: [facility] [place], [place] [id number] Lindon Romp MD [id number]   Protein electrophoresis, serum     Status: Abnormal   Collection Time: 10/05/16  6:24 PM  Result Value Ref Range   Total Protein ELP 4.8 (L) 6.0 - 8.5 g/dL   Albumin ELP 1.9 (L) 2.9 - 4.4 g/dL   Alpha-1-Globulin 0.4 0.0 - 0.4 g/dL   Alpha-2-Globulin 0.7 0.4 - 1.0 g/dL   Beta Globulin 0.9 0.7 - 1.3 g/dL    Gamma Globulin 0.9 0.4 - 1.8 g/dL   M-Spike, % Not Observed Not Observed g/dL   SPE Interp. Comment     Comment: (NOTE) The SPE pattern reflects hypoalbuminemia. Evidence of monoclonal protein is not apparent. Performed At: [facility] [place], [place] [id number] Lindon Romp MD [id number]    Comment Comment     Comment: (NOTE) Protein electrophoresis scan will follow via computer, mail, or courier delivery.    GLOBULIN, TOTAL 2.9 2.2 - 3.9 g/dL   A/G Ratio 0.7 0.7 - 1.7  Phosphorus     Status: Abnormal   Collection Time: 10/05/16  6:24 PM  Result Value Ref Range   Phosphorus 5.0 (H) 2.5 - 4.6 mg/dL  Parathyroid hormone, intact (no Ca)  Status: Abnormal   Collection Time: 10/05/16  6:24 PM  Result Value Ref Range   PTH 75 (H) 15 - 65 pg/mL    Comment: (NOTE) Performed At: [facility] [place], [place] [id number] Lindon Romp MD [id number]   Mpo/pr-3 (anca) antibodies     Status: None   Collection Time: 10/05/16  6:24 PM  Result Value Ref Range   Myeloperoxidase Abs <9.0 0.0 - 9.0 U/mL   ANCA Proteinase 3 <3.5 0.0 - 3.5 U/mL    Comment: (NOTE) Performed At: [facility] [place], [place] [id number] Lindon Romp MD [id number]   C3 complement     Status: None   Collection Time: 10/05/16  6:24 PM  Result Value Ref Range   C3 Complement 116 82 - 167 mg/dL    Comment: (NOTE) Performed At: [facility] [address] [place], [place] [id number] Lindon Romp MD [id number]   C4 complement     Status: None   Collection Time: 10/05/16  6:24 PM  Result Value Ref Range   Complement C4, Body Fluid 15 14 - 44 mg/dL    Comment: (NOTE) Performed At: [facility] [place], [place] [id number] Lindon Romp MD [id number]   Glomerular basement membrane antibodies     Status: None   Collection Time: 10/05/16  6:24 PM  Result Value Ref Range    GBM Ab 6 0 - 20 units    Comment: (NOTE)                   Negative                   0 - 20                   Weak Positive             21 - 30                   Moderate to Strong Positive   >30 Performed At: [facility] [place], [place] [id number] Lindon Romp MD [id number]   Ammonia     Status: None   Collection Time: 10/06/16  4:30 AM  Result Value Ref Range   Ammonia 29 9 - 35 umol/L  CBC     Status: Abnormal   Collection Time: 10/06/16  4:30 AM  Result Value Ref Range   WBC 19.4 (H) 3.8 - 10.6 K/uL   RBC 3.08 (L) 4.40 - 5.90 MIL/uL   Hemoglobin 9.5 (L) 13.0 - 18.0 g/dL   HCT 28.2 (L) 40.0 - 52.0 %   MCV 91.8 80.0 - 100.0 fL   MCH 30.7 26.0 - 34.0 pg   MCHC 33.5 32.0 - 36.0 g/dL   RDW 15.4 (H) 11.5 - 14.5 %   Platelets 22 (LL) 150 - 440 K/uL    Comment: CRITICAL VALUE NOTED.  VALUE IS CONSISTENT WITH PREVIOUSLY REPORTED AND CALLED VALUE. RESULT REPEATED AND VERIFIED PLATELET COUNT CONFIRMED BY SMEAR   Basic metabolic panel     Status: Abnormal   Collection Time: 10/06/16  4:30 AM  Result Value Ref Range   Sodium 134 (L) 135 - 145 mmol/L   Potassium 4.0 3.5 - 5.1 mmol/L   Chloride 106 101 - 111 mmol/L   CO2 18 (L) 22 - 32 mmol/L   Glucose, Bld 116 (H) 65 - 99 mg/dL   BUN 104 (  H) 6 - 20 mg/dL    Comment: RESULTS CONFIRMED BY MANUAL DILUTION KLW    Creatinine, Ser 4.66 (H) 0.61 - 1.24 mg/dL   Calcium 7.9 (L) 8.9 - 10.3 mg/dL   GFR calc non Af Amer 12 (L) >60 mL/min   GFR calc Af Amer 14 (L) >60 mL/min    Comment: (NOTE) The eGFR has been calculated using the CKD EPI equation. This calculation has not been validated in all clinical situations. eGFR's persistently <60 mL/min signify possible Chronic Kidney Disease.    Anion gap 10 5 - 15  Glucose, capillary     Status: Abnormal   Collection Time: 10/06/16 11:53 AM  Result Value Ref Range   Glucose-Capillary 107 (H) 65 - 99 mg/dL  MRSA PCR Screening     Status: None   Collection  Time: 10/06/16 11:59 AM  Result Value Ref Range   MRSA by PCR NEGATIVE NEGATIVE    Comment:        The GeneXpert MRSA Assay (FDA approved for NASAL specimens only), is one component of a comprehensive MRSA colonization surveillance program. It is not intended to diagnose MRSA infection nor to guide or monitor treatment for MRSA infections.   CBC     Status: Abnormal   Collection Time: 10/07/16  7:13 AM  Result Value Ref Range   WBC 24.5 (H) 3.8 - 10.6 K/uL   RBC 3.24 (L) 4.40 - 5.90 MIL/uL   Hemoglobin 10.1 (L) 13.0 - 18.0 g/dL   HCT 30.2 (L) 40.0 - 52.0 %   MCV 93.0 80.0 - 100.0 fL   MCH 31.0 26.0 - 34.0 pg   MCHC 33.3 32.0 - 36.0 g/dL   RDW 15.6 (H) 11.5 - 14.5 %   Platelets 33 (L) 150 - 440 K/uL  Renal function panel     Status: Abnormal   Collection Time: 10/07/16  7:13 AM  Result Value Ref Range   Sodium 137 135 - 145 mmol/L   Potassium 3.7 3.5 - 5.1 mmol/L   Chloride 109 101 - 111 mmol/L   CO2 19 (L) 22 - 32 mmol/L   Glucose, Bld 96 65 - 99 mg/dL   BUN 107 (H) 6 - 20 mg/dL    Comment: RESULT CONFIRMED BY MANUAL DILUTION DAS/KLW   Creatinine, Ser 5.11 (H) 0.61 - 1.24 mg/dL   Calcium 8.3 (L) 8.9 - 10.3 mg/dL   Phosphorus 3.7 2.5 - 4.6 mg/dL   Albumin 1.6 (L) 3.5 - 5.0 g/dL   GFR calc non Af Amer 11 (L) >60 mL/min   GFR calc Af Amer 13 (L) >60 mL/min    Comment: (NOTE) The eGFR has been calculated using the CKD EPI equation. This calculation has not been validated in all clinical situations. eGFR's persistently <60 mL/min signify possible Chronic Kidney Disease.    Anion gap 9 5 - 15    Current Facility-Administered Medications  Medication Dose Route Frequency Provider Last Rate Last Dose  . 0.9 %  sodium chloride infusion   Intravenous Continuous Munsoor Lateef, MD 50 mL/hr at 10/07/16 0230    . acetaminophen (TYLENOL) tablet 650 mg  650 mg Oral Q6H PRN Fritzi Mandes, MD      . asenapine (SAPHRIS) sublingual tablet 10 mg  10 mg Sublingual QHS Fritzi Mandes, MD    10 mg at 10/06/16 2113  . asenapine (SAPHRIS) sublingual tablet 5 mg  5 mg Sublingual q morning - 10a Fritzi Mandes, MD   5 mg at 10/07/16 1018  .  brimonidine (ALPHAGAN) 0.2 % ophthalmic solution 1 drop  1 drop Both Eyes Q12H Aruna Gouru, MD   1 drop at 10/07/16 1019   And  . timolol (TIMOPTIC) 0.5 % ophthalmic solution 1 drop  1 drop Both Eyes Q12H Nicholes Mango, MD   1 drop at 10/07/16 1019  . cefTRIAXone (ROCEPHIN) IVPB 2 g  2 g Intravenous Q24H Merilyn Baba, RPH      . docusate sodium (COLACE) capsule 100 mg  100 mg Oral BID Nicholes Mango, MD   100 mg at 10/07/16 0923  . FLUoxetine (PROZAC) capsule 40 mg  40 mg Oral Daily Nicholes Mango, MD   40 mg at 10/07/16 9675  . haloperidol lactate (HALDOL) injection 2 mg  2 mg Intravenous Q6H PRN Gonzella Lex, MD      . latanoprost (XALATAN) 0.005 % ophthalmic solution 1 drop  1 drop Both Eyes QHS Nicholes Mango, MD   1 drop at 10/06/16 2116  . levothyroxine (SYNTHROID, LEVOTHROID) tablet 50 mcg  50 mcg Oral QAC breakfast Nicholes Mango, MD   50 mcg at 10/07/16 0838  . multivitamin with minerals tablet 1 tablet  1 tablet Oral Daily Nicholes Mango, MD   1 tablet at 10/07/16 0923  . ondansetron (ZOFRAN) tablet 4 mg  4 mg Oral Q6H PRN Nicholes Mango, MD       Or  . ondansetron (ZOFRAN) injection 4 mg  4 mg Intravenous Q6H PRN Nicholes Mango, MD      . oxyCODONE (Oxy IR/ROXICODONE) immediate release tablet 5 mg  5 mg Oral Q6H PRN Fritzi Mandes, MD   5 mg at 10/07/16 0951  . pyridOXINE (VITAMIN B-6) tablet 100 mg  100 mg Oral BID Nicholes Mango, MD   100 mg at 10/07/16 1019  . simvastatin (ZOCOR) tablet 20 mg  20 mg Oral QHS Nicholes Mango, MD   20 mg at 10/06/16 2120  . traZODone (DESYREL) tablet 100 mg  100 mg Oral QHS Nicholes Mango, MD   100 mg at 10/06/16 2112   Facility-Administered Medications Ordered in Other Encounters  Medication Dose Route Frequency Provider Last Rate Last Dose  . cyanocobalamin ((VITAMIN B-12)) injection 1,000 mcg  1,000 mcg Intramuscular Once Lequita Asal, MD        Musculoskeletal: Strength & Muscle Tone: decreased Gait & Station: unable to stand Patient leans: Backward  Psychiatric Specialty Exam: Physical Exam  Nursing note and vitals reviewed. Constitutional: He appears well-developed and well-nourished.  HENT:  Head: Normocephalic and atraumatic.  Eyes: Conjunctivae are normal. Pupils are equal, round, and reactive to light.  Neck: Normal range of motion.  Cardiovascular: Normal heart sounds.   Respiratory: He is in respiratory distress.  GI: Soft.  Musculoskeletal: Normal range of motion.  Neurological: He is alert.  Skin: Skin is warm and dry.     Psychiatric: His affect is labile. His speech is delayed, tangential and slurred. He is agitated and slowed. He expresses impulsivity. He expresses no suicidal ideation. He expresses no suicidal plans. He exhibits abnormal recent memory.    Review of Systems  Constitutional: Negative.   HENT: Negative.   Eyes: Negative.   Respiratory: Negative.   Cardiovascular: Negative.   Gastrointestinal: Negative.   Musculoskeletal: Positive for joint pain and myalgias.  Skin: Negative.   Neurological: Negative.   Psychiatric/Behavioral: Positive for hallucinations. Negative for depression, memory loss, substance abuse and suicidal ideas. The patient is nervous/anxious and has insomnia.     Blood pressure 111/90, pulse  73, temperature 97.8 F (36.6 C), temperature source Axillary, resp. rate 14, height 6' 3"  (1.905 m), weight 87.5 kg (192 lb 14.4 oz), SpO2 100 %.Body mass index is 24.11 kg/m.  General Appearance: Disheveled  Eye Contact:  None  Speech:  Garbled  Volume:  Decreased  Mood:  Irritable  Affect:  Constricted  Thought Process:  Goal Directed  Orientation:  Full (Time, Place, and Person)  Thought Content:  Patient has some disorganization of his thought but he is able to be redirected and he actually was able to discuss his current condition without reference to  anything that appeared to be obviously delusional.  Suicidal Thoughts:  No  Homicidal Thoughts:  No  Memory:  Immediate;   Fair Recent;   Fair Remote;   Fair  Judgement:  Impaired  Insight:  Shallow  Psychomotor Activity:  Decreased  Concentration:  Concentration: Poor  Recall:  [employer] of Knowledge:  Fair  Language:  Fair  Akathisia:  No  Handed:  Right  AIMS (if indicated):     Assets:  Communication Skills Desire for Improvement Social Support  ADL's:  Impaired  Cognition:  Impaired,  Mild  Sleep:        Treatment Plan Summary: Daily contact with patient to assess and evaluate symptoms and progress in treatment, Medication management and Plan As far as his agitation I have put in orders for haloperidol 2 mg intravenous every 6 hours as needed for agitation related to delirium. This should help to control his behavior safely in the intensive care unit. We are continuing off the Depakote for now but he has been continued on his other psychiatric medicine and according to nursing has been compliant with them area   As far as his capacity, the patient, despite his psychosis and some intermittent confusion did show capacity to make decisions during my interview with him this morning. He was able to comprehend the procedure that was being offered and he was able to comprehend that physicians are telling him there is a risk of death that is high if he does not allow the procedure to proceed. Although his reasons for declining, namely that he has faith that his body is just going to recover in a short period of time, are not considered good judgment he does meet the standard of capacity for making a decision to refuse treatment with dialysis at this point. I informed the patient that if his condition proceeds to the point that he becomes completely delirious or unconsciousness that he he will lose capacity and at that point the procedure may go ahead. Also I will continue to check up with  him regularly to see if he has changed his mind. Unfortunate situation. I will continue to follow-up closely.  Disposition: Supportive therapy provided about ongoing stressors.  Alethia Berthold, MD 10/07/2016 10:47 AM

## 2016-10-07 NOTE — Progress Notes (Signed)
[place] [facility] [place] NAME: Brandon Maynard    MR#:  [id number]  DATE OF BIRTH:  1956-03-26  SUBJECTIVE:  Came in with left leg swelling and erythema Pt irritable today . Code 300 was called earlier. Wants to ist in the chair REVIEW OF SYSTEMS:   Review of Systems  Constitutional: Positive for fever. Negative for chills and weight loss.  HENT: Negative for ear discharge, ear pain and nosebleeds.   Eyes: Negative for blurred vision, pain and discharge.  Respiratory: Negative for sputum production, shortness of breath, wheezing and stridor.   Cardiovascular: Positive for leg swelling. Negative for chest pain, palpitations, orthopnea and PND.  Gastrointestinal: Negative for abdominal pain, diarrhea, nausea and vomiting.  Genitourinary: Negative for frequency and urgency.  Musculoskeletal: Positive for joint pain. Negative for back pain.  Neurological: Positive for weakness. Negative for sensory change, speech change and focal weakness.  Psychiatric/Behavioral: Negative for depression and hallucinations. The patient is not nervous/anxious.    Tolerating Diet:yes Tolerating PT: pending  DRUG ALLERGIES:   Allergies  Allergen Reactions  . Tetracyclines & Related Hives    VITALS:  Blood pressure 111/90, pulse 73, temperature 97.8 F (36.6 C), temperature source Axillary, resp. rate 14, height 6\' 3"  (1.905 m), weight 87.5 kg (192 lb 14.4 oz), SpO2 100 %.  PHYSICAL EXAMINATION:   Physical Exam  GENERAL:  60 y.o.-year-old patient lying in the bed with no acute distress.  EYES: Pupils equal, round, reactive to light and accommodation. No scleral icterus. Extraocular muscles intact.  HEENT: Head atraumatic, normocephalic. Oropharynx and nasopharynx clear.  NECK:  Supple, no jugular venous distention. No thyroid enlargement, no tenderness.  LUNGS: Normal breath sounds bilaterally, no wheezing, rales, rhonchi. No use of accessory muscles of  respiration.  CARDIOVASCULAR: S1, S2 normal. No murmurs, rubs, or gallops.  ABDOMEN: Soft, nontender, nondistended. Bowel sounds present. No organomegaly or mass.  EXTREMITIES: bilateral chronic edema with left leg swelling and large blister+ with erythema improving NEUROLOGIC: Cranial nerves II through XII are intact. No focal Motor or sensory deficits b/l.   PSYCHIATRIC:  patient is alert but falls asleep instantly and intermittent confusion SKIN: Nas above  LABORATORY PANEL:  CBC  Recent Labs Lab 10/07/16 0713  WBC 24.5*  HGB 10.1*  HCT 30.2*  PLT 33*    Chemistries   Recent Labs Lab 10/05/16 0406  10/07/16 0713  NA 130*  < > 137  K 4.2  < > 3.7  CL 101  < > 109  CO2 18*  < > 19*  GLUCOSE 90  < > 96  BUN 96*  < > 107*  CREATININE 4.53*  < > 5.11*  CALCIUM 8.1*  < > 8.3*  AST 59*  --   --   ALT 22  --   --   ALKPHOS 43  --   --   BILITOT 0.6  --   --   < > = values in this interval not displayed. Cardiac Enzymes No results for input(s): TROPONINI in the last 168 hours. RADIOLOGY:  US Renal  Result Date: 10/05/2016 CLINICAL DATA:  Acute renal failure EXAM: RENAL / URINARY TRACT ULTRASOUND COMPLETE COMPARISON:  09/01/2016, 06/28/2016 FINDINGS: Right Kidney: Length: 13.6 cm. Increased cortical echogenicity. 1.3 x 1.2 x 1.1 cm cyst at the midpole. No hydronephrosis. Left Kidney: Length: 14 cm.  Increased cortical echogenicity.  No hydronephrosis. Bladder: Increased echogenicity along the dependent portion could relate to debris or possible wall  thickening. IMPRESSION: 1. Increased cortical echogenicity bilaterally, consistent with medical renal disease. No hydronephrosis. 2. Increased echogenicity along the dependent portion of the bladder, this could represent bladder debris versus wall thickening. Electronically Signed   By: Donavan Foil M.D.   On: 10/05/2016 16:42   Ct Tibia Fibula Left Wo Contrast  Result Date: 10/07/2016 CLINICAL DATA:  Left lower extremity pain,  swelling and redness with a large blister on the dorsal aspect of the left foot of unknown duration. Elevated white blood cell count and hypotension. EXAM: CT TIBIA FIBULA LEFT WITHOUT CONTRAST TECHNIQUE: Multidetector CT imaging was performed according to the standard protocol. Multiplanar CT image reconstructions were also generated. COMPARISON:  None. FINDINGS: Imaged bones appear normal without destructive change or periosteal reaction. There is no fracture or dislocation. No evidence of arthropathy. Very small bone island in the medial talus is noted. Subcutaneous edema diffusely about the lower leg increases in intensity about the ankle and foot. A large blister type lesion in the cutaneous tissues over the dorsum of the foot measures 5.1 cm transverse by 1.5 cm craniocaudal by approximately 6.5 cm long and is centered over the second, third and fourth metatarsals. No septations or gas within the blister are identified. A second much smaller blister measuring 0.7 cm transverse by 0.3 cm craniocaudal by approximately 2.0 cm long is seen centered over the medial cuneiform in the dorsal soft tissues. No soft tissue gas including gas dissecting within fascial planes is seen. Fat planes within muscle are preserved. IMPRESSION: Subcutaneous edema about the left lower leg increases in intensity distally and is most consistent with cellulitis given the patient's history. 2 cutaneous fluid collections over the dorsum of the foot are consistent with blisters as described in the patient's history and could be septic or aseptic. Negative for evidence of myositis, fasciitis or osteomyelitis. Electronically Signed   By: Inge Rise M.D.   On: 10/07/2016 07:21   ASSESSMENT AND PLAN:   Brandon Maynard  is a 60 y.o. male with a known history of Bipolar disorder, cataract, chronic thrombocytopenia is presenting to the ED with a chief complaint of significant worsening of left lower extremity pain, redness and swelling which  started with a small blister on the left foot. Denies any insect bites  #Sepsis meets criteria with leukocytosis, elevated lactic acid and hypotension. Secondary to left leg cellulitis IV Zosyn and vancomycin---to IV meropenem since [facility] ecoli ID consult today Pain management Keep legs elevated -Vascular surgery eval noted. Dr Ronalee Belts does not feel any need for intervention for PAD CT tibia-fibula shows inflammation and swelling. No loculated collection of fluid.  #Acute Encephalopathy with  hypotension with elevated wbc and increasingconfusion with increasing BUN and creat Patient will likely need hemodialysis down the road how were according to nephrology patient is declining to start hemodialysis. Psychiatry feels patient is able to make decisions.  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 Mentating fluctuating 130--134--137  #Thrombocytopenia significant platelet count is 42,000 -plt 42K---18K--22k -multifactorial. Pt has been evaluated by Oncology in the past and appears due to meds/sepsis and CKD -Depakote has been d/ced by Dr Weber Cooks  #DVT prophylaxis with SCDs  We will need to discuss discharge planning with him management and social worker. Transfer to medical floor.  Case discussed with Care Management/Social Worker. Management plans discussed with the patient, family and they are in agreement.  CODE STATUS:FULL DVT Prophylaxis: SCD TOTAL  criticalTIME TAKING CARE OF THIS PATIENT  40 minutes.  >50% time spent on counselling and coordination of care   Note: This dictation was prepared with Dragon dictation along with smaller phrase technology. Any transcriptional errors that result from this process are unintentional.  Brandon Maynard M.D on 10/07/2016 at 2:07 PM  Between 7am to 6pm - Pager - [phone]  After 6pm go to [email or web address] - password [email or web address] [facility]  [place]  [facility]  Office  [phone]  CC: Primary care physician; [name], MD

## 2016-10-07 NOTE — Care Management (Addendum)
Received callback from patient's brother with updated information. The facility has not called me back. Per the brother patient will not be able to return to facility with IV ABX and agrees to bed search if needed. Patient also has a [facility] policy ID [id number] [place] [id number] PHONE [phone] Rx BIN # [id number] RX [place] UHELTH Under Wilma Avellino (patients mother). I have since learned that patient does have a [employer] policy but not the one listed above (will not make any difference as far as I can tell). Uses [facility] for transportation. PCP Dr. [name]. No DME at facility.

## 2016-10-07 NOTE — Progress Notes (Signed)
Report given to [place] on [place], pt transferred on the wheelchair with CNA around. VS wnl at time of transfer, pt placed in the bed on 2C, bedrails up, call bell within reach, bed exit alarm applied. Nurse notified of pt in the room. Nursing student at bedside.

## 2016-10-08 LAB — CBC WITH DIFFERENTIAL/PLATELET
Band Neutrophils: 2 %
Basophils Absolute: 0 10*3/uL (ref 0–0.1)
Basophils Relative: 0 %
Blasts: 0 %
Eosinophils Absolute: 0 10*3/uL (ref 0–0.7)
Eosinophils Relative: 0 %
HCT: 29.4 % — ABNORMAL LOW (ref 40.0–52.0)
Hemoglobin: 9.7 g/dL — ABNORMAL LOW (ref 13.0–18.0)
Lymphocytes Relative: 13 %
Lymphs Abs: 3.2 10*3/uL (ref 1.0–3.6)
MCH: 30.6 pg (ref 26.0–34.0)
MCHC: 33 g/dL (ref 32.0–36.0)
MCV: 92.7 fL (ref 80.0–100.0)
Metamyelocytes Relative: 0 %
Monocytes Absolute: 1 10*3/uL (ref 0.2–1.0)
Monocytes Relative: 4 %
Myelocytes: 0 %
Neutro Abs: 20.1 10*3/uL — ABNORMAL HIGH (ref 1.4–6.5)
Neutrophils Relative %: 81 %
Other: 0 %
Platelets: 40 10*3/uL — ABNORMAL LOW (ref 150–440)
Promyelocytes Absolute: 0 %
RBC: 3.17 MIL/uL — ABNORMAL LOW (ref 4.40–5.90)
RDW: 15.3 % — ABNORMAL HIGH (ref 11.5–14.5)
WBC: 24.3 10*3/uL — ABNORMAL HIGH (ref 3.8–10.6)
nRBC: 0 /100 WBC

## 2016-10-08 LAB — RENAL FUNCTION PANEL
ALBUMIN: 1.5 g/dL — AB (ref 3.5–5.0)
ANION GAP: 7 (ref 5–15)
BUN: 109 mg/dL — AB (ref 6–20)
CALCIUM: 8.1 mg/dL — AB (ref 8.9–10.3)
CO2: 20 mmol/L — AB (ref 22–32)
CREATININE: 5.07 mg/dL — AB (ref 0.61–1.24)
Chloride: 108 mmol/L (ref 101–111)
GFR calc Af Amer: 13 mL/min — ABNORMAL LOW (ref >60)
GFR calc non Af Amer: 11 mL/min — ABNORMAL LOW (ref >60)
GLUCOSE: 84 mg/dL (ref 65–99)
Phosphorus: 3.9 mg/dL (ref 2.5–4.6)
Potassium: 4 mmol/L (ref 3.5–5.1)
SODIUM: 135 mmol/L (ref 135–145)

## 2016-10-08 MED ORDER — CEPHALEXIN 500 MG PO CAPS
500.0000 mg | ORAL_CAPSULE | Freq: Two times a day (BID) | ORAL | Status: DC
  Administered 2016-10-08 – 2016-10-10 (×5): 500 mg via ORAL
  Filled 2016-10-08 (×5): qty 1

## 2016-10-08 NOTE — Plan of Care (Signed)
Problem: Tissue Perfusion: Goal: Risk factors for ineffective tissue perfusion will decrease Outcome: Progressing LLE redness lessening; continues to be swollen; large blister on top of foot, flat; warm to touch.

## 2016-10-08 NOTE — Consult Note (Signed)
[facility] Consult   Reason for Consult:  Follow-up consult for 60 year old man with a history of chronic mental illness currently in the hospital with acute and complicated medical problems stemming from probably sepsis from a cellulitis and labile blood pressures. Updated problem as of today he is assessing capacity for decision-making Referring Physician:  Patel/Lateef Patient Identification: Brandon T Hodgkiss Jr. MRN:  [id number] Principal Diagnosis: Acute delirium Diagnosis:   Patient Active Problem List   Diagnosis Date Noted  . Acute delirium [R41.0] 10/05/2016  . Left leg cellulitis [D32.202] 10/04/2016  . B12 deficiency [E53.8] 07/06/2016  . Anemia [D64.9] 06/16/2016  . Thrombocytopenia ([place]) [D69.6] 06/16/2016  . Weight loss [R63.4] 06/16/2016  . Esophageal reflux [K21.9] 05/12/2016  . 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 Schizoaffective disorder ([place]) [F25.9] 06/13/2014    Total Time spent with patient: 45 minutes  Subjective:   Brandon Oiler. is a 60 y.o. male patient admitted with "I'm not feeling good".  HPI:  Follow-up note for Friday the 10th. Spoke to Dr. Zollie Scale this morning. Spoke with patient and reviewed chart. This evening I found the patient in his hospital room awake arousable but disorganized and disoriented. New he was in the hospital but not consistently. His speech was tangential and confused. He had a vague idea that he had agreed to some kind of procedure but could not describe it for me. He tells me that he is feeling more tired today.   Past Psychiatric History: Chronic schizophrenia or schizoaffective disorder. Recently we have discontinued his Depakote out of concern for it worsening his thrombocytopenia and medical problems. He is still on his antipsychotic.  Risk to Self: Is patient at risk for suicide?: No Risk to Others:   Prior Inpatient Therapy:   Prior Outpatient  Therapy:    Past Medical History:  Past Medical History:  Diagnosis Date  . Anemia   . Bipolar disorder ([place])   . Cancer ([place])   . Cataract    right eye  . DDD (degenerative disc disease)   . GERD (gastroesophageal reflux disease)   . Heart murmur   . Hyperlipidemia   . Hypertension   . Paranoid schizophrenia ([place])   . 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 Thyroid disease     Past Surgical History:  Procedure Laterality Date  . Dermal Abrasion  1973   Family History:  Family History  Problem Relation Age of Onset  . Brain cancer Maternal Uncle   . Breast cancer Mother   . Melanoma Mother   . Congestive Heart Failure Father   . Melanoma Father   . Breast cancer Maternal Aunt   . Diabetes Maternal Aunt    Family Psychiatric  History: No family history identified. Social History:  History  Alcohol Use No     History  Drug Use No    Social History   Social History  . Marital status: Single    Spouse name: N/A  . Number of children: N/A  . Years of education: N/A   Social History Main Topics  . Smoking status: Current Every Day Smoker    Last attempt to quit: 04/09/1991  . Smokeless tobacco: Never Used  . Alcohol use No  . Drug use: No  . Sexual activity: Not Asked   Other Topics Concern  . None   Social History Narrative  . None   Additional Social History:Patient's brother, who lives in [place], is the  only relative actively involved in making decisions. I am told by nephrology that the brother has been contacted and expresses agreement with proceeding with dialysis. Unfortunately he has not been made the healthcare power of attorney or guardian at this point.    Allergies:   Allergies  Allergen Reactions  . Tetracyclines & Related Hives    Labs:  Results for orders placed or performed during the hospital encounter of 10/04/16 (from the past 48 hour(s))  CBC     Status: Abnormal   Collection Time: 10/07/16  7:13 AM  Result Value Ref Range   WBC 24.5  (H) 3.8 - 10.6 K/uL   RBC 3.24 (L) 4.40 - 5.90 MIL/uL   Hemoglobin 10.1 (L) 13.0 - 18.0 g/dL   HCT 30.2 (L) 40.0 - 52.0 %   MCV 93.0 80.0 - 100.0 fL   MCH 31.0 26.0 - 34.0 pg   MCHC 33.3 32.0 - 36.0 g/dL   RDW 15.6 (H) 11.5 - 14.5 %   Platelets 33 (L) 150 - 440 K/uL  Renal function panel     Status: Abnormal   Collection Time: 10/07/16  7:13 AM  Result Value Ref Range   Sodium 137 135 - 145 mmol/L   Potassium 3.7 3.5 - 5.1 mmol/L   Chloride 109 101 - 111 mmol/L   CO2 19 (L) 22 - 32 mmol/L   Glucose, Bld 96 65 - 99 mg/dL   BUN 107 (H) 6 - 20 mg/dL    Comment: RESULT CONFIRMED BY MANUAL DILUTION DAS/KLW   Creatinine, Ser 5.11 (H) 0.61 - 1.24 mg/dL   Calcium 8.3 (L) 8.9 - 10.3 mg/dL   Phosphorus 3.7 2.5 - 4.6 mg/dL   Albumin 1.6 (L) 3.5 - 5.0 g/dL   GFR calc non Af Amer 11 (L) >60 mL/min   GFR calc Af Amer 13 (L) >60 mL/min    Comment: (NOTE) The eGFR has been calculated using the CKD EPI equation. This calculation has not been validated in all clinical situations. eGFR's persistently <60 mL/min signify possible Chronic Kidney Disease.    Anion gap 9 5 - 15  CBC with Differential     Status: Abnormal   Collection Time: 10/08/16  7:21 AM  Result Value Ref Range   WBC 24.3 (H) 3.8 - 10.6 K/uL   RBC 3.17 (L) 4.40 - 5.90 MIL/uL   Hemoglobin 9.7 (L) 13.0 - 18.0 g/dL   HCT 29.4 (L) 40.0 - 52.0 %   MCV 92.7 80.0 - 100.0 fL   MCH 30.6 26.0 - 34.0 pg   MCHC 33.0 32.0 - 36.0 g/dL   RDW 15.3 (H) 11.5 - 14.5 %   Platelets 40 (L) 150 - 440 K/uL   Neutrophils Relative % 81 %   Lymphocytes Relative 13 %   Monocytes Relative 4 %   Eosinophils Relative 0 %   Basophils Relative 0 %   Band Neutrophils 2 %   Metamyelocytes Relative 0 %   Myelocytes 0 %   Promyelocytes Absolute 0 %   Blasts 0 %   nRBC 0 0 /100 WBC   Other 0 %   Neutro Abs 20.1 (H) 1.4 - 6.5 K/uL   Lymphs Abs 3.2 1.0 - 3.6 K/uL   Monocytes Absolute 1.0 0.2 - 1.0 K/uL   Eosinophils Absolute 0.0 0 - 0.7 K/uL    Basophils Absolute 0.0 0 - 0.1 K/uL   WBC Morphology ATYPICAL LYMPHOCYTES     Comment: HYPOCHROMIA  Renal function panel  Status: Abnormal   Collection Time: 10/08/16  7:21 AM  Result Value Ref Range   Sodium 135 135 - 145 mmol/L   Potassium 4.0 3.5 - 5.1 mmol/L   Chloride 108 101 - 111 mmol/L   CO2 20 (L) 22 - 32 mmol/L   Glucose, Bld 84 65 - 99 mg/dL   BUN 109 (H) 6 - 20 mg/dL    Comment: RESULT CONFIRMED BY MANUAL DILUTION DAS   Creatinine, Ser 5.07 (H) 0.61 - 1.24 mg/dL   Calcium 8.1 (L) 8.9 - 10.3 mg/dL   Phosphorus 3.9 2.5 - 4.6 mg/dL   Albumin 1.5 (L) 3.5 - 5.0 g/dL   GFR calc non Af Amer 11 (L) >60 mL/min   GFR calc Af Amer 13 (L) >60 mL/min    Comment: (NOTE) The eGFR has been calculated using the CKD EPI equation. This calculation has not been validated in all clinical situations. eGFR's persistently <60 mL/min signify possible Chronic Kidney Disease.    Anion gap 7 5 - 15    Current Facility-Administered Medications  Medication Dose Route Frequency Provider Last Rate Last Dose  . acetaminophen (TYLENOL) tablet 650 mg  650 mg Oral Q6H PRN Fritzi Mandes, MD      . asenapine (SAPHRIS) sublingual tablet 10 mg  10 mg Sublingual QHS Fritzi Mandes, MD   10 mg at 10/08/16 2105  . asenapine (SAPHRIS) sublingual tablet 5 mg  5 mg Sublingual q morning - 10a Fritzi Mandes, MD   5 mg at 10/08/16 1021  . brimonidine (ALPHAGAN) 0.2 % ophthalmic solution 1 drop  1 drop Both Eyes Q12H Aruna Gouru, MD   1 drop at 10/08/16 2106   And  . timolol (TIMOPTIC) 0.5 % ophthalmic solution 1 drop  1 drop Both Eyes Q12H Nicholes Mango, MD   1 drop at 10/08/16 2106  . cephALEXin (KEFLEX) capsule 500 mg  500 mg Oral Q12H Darylene Price Addison, RPH   500 mg at 10/08/16 2105  . docusate sodium (COLACE) capsule 100 mg  100 mg Oral BID Nicholes Mango, MD   100 mg at 10/08/16 2105  . FLUoxetine (PROZAC) capsule 40 mg  40 mg Oral Daily Nicholes Mango, MD   40 mg at 10/08/16 1021  . haloperidol lactate (HALDOL) injection 2 mg   2 mg Intravenous Q6H PRN Gonzella Lex, MD      . latanoprost (XALATAN) 0.005 % ophthalmic solution 1 drop  1 drop Both Eyes QHS Nicholes Mango, MD   1 drop at 10/08/16 2106  . levothyroxine (SYNTHROID, LEVOTHROID) tablet 50 mcg  50 mcg Oral QAC breakfast Nicholes Mango, MD   50 mcg at 10/08/16 0752  . multivitamin with minerals tablet 1 tablet  1 tablet Oral Daily Nicholes Mango, MD   1 tablet at 10/08/16 1021  . ondansetron (ZOFRAN) tablet 4 mg  4 mg Oral Q6H PRN Nicholes Mango, MD       Or  . ondansetron (ZOFRAN) injection 4 mg  4 mg Intravenous Q6H PRN Nicholes Mango, MD      . oxyCODONE (Oxy IR/ROXICODONE) immediate release tablet 5 mg  5 mg Oral Q6H PRN Fritzi Mandes, MD   5 mg at 10/07/16 0951  . pyridOXINE (VITAMIN B-6) tablet 100 mg  100 mg Oral BID Nicholes Mango, MD   100 mg at 10/08/16 2106  . simvastatin (ZOCOR) tablet 20 mg  20 mg Oral QHS Nicholes Mango, MD   20 mg at 10/08/16 2105  . traZODone (DESYREL) tablet 100  mg  100 mg Oral QHS Nicholes Mango, MD   100 mg at 10/08/16 2200   Facility-Administered Medications Ordered in Other Encounters  Medication Dose Route Frequency Provider Last Rate Last Dose  . cyanocobalamin ((VITAMIN B-12)) injection 1,000 mcg  1,000 mcg Intramuscular Once Lequita Asal, MD        Musculoskeletal: Strength & Muscle Tone: decreased Gait & Station: unable to stand Patient leans: Backward  Psychiatric Specialty Exam: Physical Exam  Nursing note and vitals reviewed. Constitutional: He appears well-developed and well-nourished.  HENT:  Head: Normocephalic and atraumatic.  Eyes: Conjunctivae are normal. Pupils are equal, round, and reactive to light.  Neck: Normal range of motion.  Cardiovascular: Normal heart sounds.   Respiratory: He is in respiratory distress.  GI: Soft.  Musculoskeletal: Normal range of motion.  Neurological: He is alert.  Skin: Skin is warm and dry.     Psychiatric: His affect is labile. His speech is delayed, tangential and slurred. He is  agitated and slowed. He expresses impulsivity. He expresses no suicidal ideation. He expresses no suicidal plans. He exhibits abnormal recent memory.    Review of Systems  Constitutional: Negative.   HENT: Negative.   Eyes: Negative.   Respiratory: Negative.   Cardiovascular: Negative.   Gastrointestinal: Negative.   Musculoskeletal: Positive for joint pain and myalgias.  Skin: Negative.   Neurological: Negative.   Psychiatric/Behavioral: Positive for hallucinations. Negative for depression, memory loss, substance abuse and suicidal ideas. The patient is nervous/anxious and has insomnia.     Blood pressure 118/62, pulse 68, temperature 98.3 F (36.8 C), temperature source Oral, resp. rate 16, height _0  (1.905 m), weight 87.5 kg (192 lb 14.4 oz), SpO2 100 %.Body mass index is 24.11 kg/m.  General Appearance: Disheveled  Eye Contact:  None  Speech:  Garbled  Volume:  Decreased  Mood:  Irritable  Affect:  Constricted  Thought Process:  Goal Directed  Orientation:  Full (Time, Place, and Person)  Thought Content:  Patient has some disorganization of his thought but he is able to be redirected and he actually was able to discuss his current condition without reference to anything that appeared to be obviously delusional.  Suicidal Thoughts:  No  Homicidal Thoughts:  No  Memory:  Immediate;   Fair Recent;   Fair Remote;   Fair  Judgement:  Impaired  Insight:  Shallow  Psychomotor Activity:  Decreased  Concentration:  Concentration: Poor  Recall:  [employer] of Knowledge:  Fair  Language:  Fair  Akathisia:  No  Handed:  Right  AIMS (if indicated):     Assets:  Communication Skills Desire for Improvement Social Support  ADL's:  Impaired  Cognition:  Impaired,  Mild  Sleep:        Treatment Plan Summary: Daily contact with patient to assess and evaluate symptoms and progress in treatment, Medication management and Plan As far as his agitation I have put in orders for  haloperidol 2 mg intravenous every 6 hours as needed for agitation related to delirium. This should help to control his behavior safely in the intensive care unit. We are continuing off the Depakote for now but he has been continued on his other psychiatric medicine and according to nursing has been compliant with them area   At this point, as I expected, his delirium and confusion have progressed to the point where he no longer has capacity. Not able to understand current treatment plan or articulate pros and cons or  description of the treatment. The less he is now saying that he agrees to get the dialysis and apparently he agreed this when speaking with his brother. At this point there should not be any barrier to starting dialysis as needed or other treatment. He does not appear to be manic this evening. Hard to tell whether that's going to get worse with the delirium. I will continue to follow-up as needed. Disposition: Supportive therapy provided about ongoing stressors.  Alethia Berthold, MD 10/08/2016 10:27 PM

## 2016-10-08 NOTE — Consult Note (Signed)
[facility]  Date of admission:  110/04/2016  Inpatient day:  10/07/2016  Consulting physician: Dr. 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 is a 60 y.o. male admitted with sepsis (leukocytosis, elevated lactic acid, and hypotension) secondary to left lower extremity cellulitis.  HPI: The patient has at least a 6 year history of anemia and thrombocytopenia. Bone marrow on 08/06/2016 revealed no evidence of leukemia, lymphoma, or myelodysplasia.  Cytopenias were felt likely multi-factorial and due to chronic disease and drug/medication effect.  He was on Depakote which has a 1-27% incidence of thrombocytopenia (dose related).    He has chronic kidney disease (CKD) associated with a 30 year history of Lithium use (stoped in the 1990s).  CKD is contributing to his anemia.  Creatinine has been stable between 2.04-2.38 (CrCl 28-31 ml/min).   Work-up on 06/16/2016 revealed the following were normal:  ANA, hepatitis B surface antigen , hepatitis B core antibody total , hepatitis C antibody, HIV antibody, PTT, PT, iron studies (iron saturation 14% and TIBC of 383), ESR, folate, B12 (326; low normal). Reticulocyte count was 1.5%.  Additional labs on 07/06/2016 revealed a normal SPEP and free light chains.  Peripheral smear was normal.  He has B12 deficiency.  MMA was 445 (high) on 07/06/2016.  He received B12 x 2 (07/12/2016 and 07/19/2016), but stopped secondary to a "reaction".  He started taking oral B12.  He has had weight loss of 23 pounds since 01/2016.  He has no nausea, vomiting or diarrhea.  TSH was normal on 05/12/2016.  PET scan on 09/01/2016 revealed no hypermetabolic source of primary malignancy.    The patient is unable to give a good history of recent events.  He states that his left leg was "normal on Sunday" (10/03/2016) he was seen in the emergency room on 10/04/2016 complaining of foot pain.  Notes indicate  he had significant redness and swelling as well as a large blister over the tear surface of the left foot.   The patient was admitted with cellulitis of the left lower extremity.  He was started on vancomycin and Zosyn.  Blood cultures grew E coli.  Urine cultures grew 30,000 colonies/ml E coli.  He is currently on ceftriaxone.  Left lower extremity duplex revealed no DVT.  Platelet count prior to admission was 63,000 - 82,000. Platelet count on admission was 42,000.  Platelet count was 18,000 on 10/05/2016, 22,000 on 10/06/2016 and 33,000 on 10/07/2016.  Creatinine on admission was 4.33 and is currently 5.11.   He has declined dialysis. He has been seen by psychiatry to assess his ability to make decisions. He states that he does not rule out dialysis but "tomorrow is another day". He states that "so much is piled up on me".   Past Medical History:  Diagnosis Date  . Anemia   . Bipolar disorder ([place])   . Cancer ([place])   . Cataract    right eye  . DDD (degenerative disc disease)   . GERD (gastroesophageal reflux disease)   . Heart murmur   . Hyperlipidemia   . Hypertension   . Paranoid schizophrenia ([place])   . 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 Thyroid disease     Past Surgical History:  Procedure Laterality Date  . Dermal Abrasion  1973    Family History  Problem Relation Age of Onset  . Brain cancer Maternal Uncle   . Breast cancer Mother   .  Melanoma Mother   . Congestive Heart Failure Father   . Melanoma Father   . Breast cancer Maternal Aunt   . Diabetes Maternal Aunt     Social History:  reports that he has been smoking.  He has never used smokeless tobacco. He reports that he does not drink alcohol or use drugs.  The patient states that he took drugs when he was younger (age [age]).  He took LSD, speed, and Karle Starch (marijuana).  He never did IV drugs.  He previously drank alcohol.  He stopped drinking wine 30 years ago.  He lives in the [place].  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  Contact number [phone].  He has a brother who lives out of state.  He is alone today.  Allergies:  Allergies  Allergen Reactions  . Tetracyclines & Related Hives    Medications Prior to Admission  Medication Sig Dispense Refill  . asenapine (SAPHRIS) 5 MG SUBL 24 hr tablet Place 5-10 mg under the tongue 2 (two) times daily. Take 43m daily in morning and take 173mdaily at bedtime    . bimatoprost (LUMIGAN) 0.01 % SOLN Place 1 drop into both eyes at bedtime.    . brimonidine-timolol (COMBIGAN) 0.2-0.5 % ophthalmic solution Place 1 drop into both eyes every 12 (twelve) hours.    . clonazePAM (KLONOPIN) 1 MG tablet Take 1 tablet (1 mg total) by mouth at bedtime. 30 tablet 0  . divalproex (DEPAKOTE ER) 500 MG 24 hr tablet Take 2 tablets (1,000 mg total) by mouth at bedtime. (Patient taking differently: Take 500 mg by mouth at bedtime. Take one tablet by mouth in am and two tabs by mouth in pm) 60 tablet 0  . FLUoxetine (PROZAC) 20 MG capsule Take 40 mg by mouth daily.    . [place] Kitchenabetalol (NORMODYNE) 100 MG tablet Take 100 mg by mouth 2 (two) times daily.    . [place] Kitchenevothyroxine (SYNTHROID, LEVOTHROID) 50 MCG tablet Take 50 mcg by mouth daily before breakfast.    . Multiple Vitamin (MULTIVITAMIN WITH MINERALS) TABS tablet Take 1 tablet by mouth daily.    . [place] KitchenyridOXINE (VITAMIN B-6) 50 MG tablet Take 100 mg by mouth 2 (two) times daily.    . simvastatin (ZOCOR) 20 MG tablet TAKE 1 TABLET BY MOUTH EVERY NIGHT AT BEDTIME 30 tablet 0  . traZODone (DESYREL) 100 MG tablet Take 100 mg by mouth at bedtime.    . [place] Kitchensenapine (SAPHRIS) 5 MG SUBL 24 hr tablet Place 2 tablets (10 mg total) under the tongue at bedtime. (Patient not taking: Reported on 10/04/2016) 60 tablet 0  . esomeprazole (NEXIUM) 40 MG capsule TAKE 1 CAPSULE BY MOUTH DAILY (Patient not taking: Reported on 10/04/2016) 30 capsule 0  . FLUoxetine (PROZAC) 20 MG tablet Take 2 tablets (40 mg total) by mouth daily. (Patient  not taking: Reported on 10/04/2016) 60 tablet 0  . labetalol (NORMODYNE) 100 MG tablet TAKE 1 TABLET BY MOUTH TWICE A DAY (Patient not taking: Reported on 10/04/2016) 60 tablet 0  . labetalol (NORMODYNE) 200 MG tablet Take 0.5 tablets (100 mg total) by mouth 2 (two) times daily. (Patient not taking: Reported on 10/04/2016) 30 tablet 5  . levothyroxine (SYNTHROID, LEVOTHROID) 50 MCG tablet TAKE 1 TABLET BY MOUTH EVERY MORNING 30 tablet 0  . Multiple Vitamins-Minerals (MULTIVITAMIN WITH MINERALS) tablet TAKE 1 TABLET BY MOUTH DAILY (Patient not taking: Reported on 10/04/2016) 30 tablet 0  . traZODone (DESYREL) 100 MG tablet Take  0.5 tablets (50 mg total) by mouth at bedtime. (Patient taking differently: Take 100 mg by mouth at bedtime. )      Review of Systems: GENERAL:  Feels "ok".  Doesn't remember fever or sweats. PERFORMANCE STATUS (ECOG):  1 HEENT:  Glaucoma.  Notes vision blurry "6 inches from me".  No runny nose, sore throat, mouth sores or tenderness. Lungs: No shortness of breath or cough.  No hemoptysis. Cardiac:  No chest pain, palpitations, orthopnea, or PND. GI:  No nausea, vomiting, diarrhea, constipation, melena or hematochezia.  GU:  No urgency, frequency, dysuria, or hematuria. Musculoskeletal:  No back pain.  No joint pain.  No muscle tenderness. Extremities:  Left leg pain and swelling. Skin:  Bruises easily.  No rashes or skin changes. Neuro:  Short term memory poor.  No headache, numbness or weakness, balance or coordination issues. Endocrine:  No diabetes, thyroid issues, hot flashes or night sweats. Psych:  Schizophrenia.  Bipolar.   Pain:  No focal pain. Review of systems:  All other systems reviewed and found to be negative.  Physical Exam:  Blood pressure (!) 105/57, pulse 80, temperature 99.3 F (37.4 C), temperature source Oral, resp. rate 20, height 6' 3"  (1.905 m), weight 192 lb 14.4 oz (87.5 kg), SpO2 100 %.  GENERAL:  Disheveled gentleman with food spilled down  front of his gown sitting comfortably on the medical unit in no acute distress. MENTAL STATUS:  Alert and oriented to person and place. HEAD:  Pearline Cables hair.  Male pattern baldness.  Normocephalic, atraumatic, face symmetric, no Cushingoid features. EYES:Blue eyes. No conjunctivitis or scleral icterus. PXT:GGYIRSWNIO clear without lesion. Tonguenormal. Mucous membranes  RESPIRATORY:Clear to auscultationwithout rales, wheezes or rhonchi. CARDIOVASCULAR:Regular rate andrhythmwithout murmur, rub or gallop. ABDOMEN:Soft, non-tender, with active bowel sounds, and no appreciable hepatosplenomegaly. No masses. SKIN: No rashes, ulcers or lesions. EXTREMITIES: Left lower extremity edema with marked erythema below left knee with associated unroofed bullous lesion of left foot . Chronic edema of right lower extremity. LYMPHNODES: No palpable cervical, supraclavicular, axillary or inguinal adenopathy  NEUROLOGICAL: Unremarkable. PSYCH: Near baseline.   Results for orders placed or performed during the hospital encounter of 10/04/16 (from the past 48 hour(s))  Ammonia     Status: None   Collection Time: 10/06/16  4:30 AM  Result Value Ref Range   Ammonia 29 9 - 35 umol/L  CBC     Status: Abnormal   Collection Time: 10/06/16  4:30 AM  Result Value Ref Range   WBC 19.4 (H) 3.8 - 10.6 K/uL   RBC 3.08 (L) 4.40 - 5.90 MIL/uL   Hemoglobin 9.5 (L) 13.0 - 18.0 g/dL   HCT 28.2 (L) 40.0 - 52.0 %   MCV 91.8 80.0 - 100.0 fL   MCH 30.7 26.0 - 34.0 pg   MCHC 33.5 32.0 - 36.0 g/dL   RDW 15.4 (H) 11.5 - 14.5 %   Platelets 22 (LL) 150 - 440 K/uL    Comment: CRITICAL VALUE NOTED.  VALUE IS CONSISTENT WITH PREVIOUSLY REPORTED AND CALLED VALUE. RESULT REPEATED AND VERIFIED PLATELET COUNT CONFIRMED BY SMEAR   Basic metabolic panel     Status: Abnormal   Collection Time: 10/06/16  4:30 AM  Result Value Ref Range   Sodium 134 (L) 135 - 145 mmol/L   Potassium 4.0 3.5 - 5.1 mmol/L   Chloride 106  101 - 111 mmol/L   CO2 18 (L) 22 - 32 mmol/L   Glucose, Bld 116 (H) 65 - 99 mg/dL  BUN 104 (H) 6 - 20 mg/dL    Comment: RESULTS CONFIRMED BY MANUAL DILUTION KLW    Creatinine, Ser 4.66 (H) 0.61 - 1.24 mg/dL   Calcium 7.9 (L) 8.9 - 10.3 mg/dL   GFR calc non Af Amer 12 (L) >60 mL/min   GFR calc Af Amer 14 (L) >60 mL/min    Comment: (NOTE) The eGFR has been calculated using the CKD EPI equation. This calculation has not been validated in all clinical situations. eGFR's persistently <60 mL/min signify possible Chronic Kidney Disease.    Anion gap 10 5 - 15  Glucose, capillary     Status: Abnormal   Collection Time: 10/06/16 11:53 AM  Result Value Ref Range   Glucose-Capillary 107 (H) 65 - 99 mg/dL  MRSA PCR Screening     Status: None   Collection Time: 10/06/16 11:59 AM  Result Value Ref Range   MRSA by PCR NEGATIVE NEGATIVE    Comment:        The GeneXpert MRSA Assay (FDA approved for NASAL specimens only), is one component of a comprehensive MRSA colonization surveillance program. It is not intended to diagnose MRSA infection nor to guide or monitor treatment for MRSA infections.   CBC     Status: Abnormal   Collection Time: 10/07/16  7:13 AM  Result Value Ref Range   WBC 24.5 (H) 3.8 - 10.6 K/uL   RBC 3.24 (L) 4.40 - 5.90 MIL/uL   Hemoglobin 10.1 (L) 13.0 - 18.0 g/dL   HCT 30.2 (L) 40.0 - 52.0 %   MCV 93.0 80.0 - 100.0 fL   MCH 31.0 26.0 - 34.0 pg   MCHC 33.3 32.0 - 36.0 g/dL   RDW 15.6 (H) 11.5 - 14.5 %   Platelets 33 (L) 150 - 440 K/uL  Renal function panel     Status: Abnormal   Collection Time: 10/07/16  7:13 AM  Result Value Ref Range   Sodium 137 135 - 145 mmol/L   Potassium 3.7 3.5 - 5.1 mmol/L   Chloride 109 101 - 111 mmol/L   CO2 19 (L) 22 - 32 mmol/L   Glucose, Bld 96 65 - 99 mg/dL   BUN 107 (H) 6 - 20 mg/dL    Comment: RESULT CONFIRMED BY MANUAL DILUTION DAS/KLW   Creatinine, Ser 5.11 (H) 0.61 - 1.24 mg/dL   Calcium 8.3 (L) 8.9 - 10.3 mg/dL    Phosphorus 3.7 2.5 - 4.6 mg/dL   Albumin 1.6 (L) 3.5 - 5.0 g/dL   GFR calc non Af Amer 11 (L) >60 mL/min   GFR calc Af Amer 13 (L) >60 mL/min    Comment: (NOTE) The eGFR has been calculated using the CKD EPI equation. This calculation has not been validated in all clinical situations. eGFR's persistently <60 mL/min signify possible Chronic Kidney Disease.    Anion gap 9 5 - 15   Ct Tibia Fibula Left Wo Contrast  Result Date: 10/07/2016 CLINICAL DATA:  Left lower extremity pain, swelling and redness with a large blister on the dorsal aspect of the left foot of unknown duration. Elevated white blood cell count and hypotension. EXAM: CT TIBIA FIBULA LEFT WITHOUT CONTRAST TECHNIQUE: Multidetector CT imaging was performed according to the standard protocol. Multiplanar CT image reconstructions were also generated. COMPARISON:  None. FINDINGS: Imaged bones appear normal without destructive change or periosteal reaction. There is no fracture or dislocation. No evidence of arthropathy. Very small bone island in the medial talus is noted. Subcutaneous edema diffusely about  the lower leg increases in intensity about the ankle and foot. A large blister type lesion in the cutaneous tissues over the dorsum of the foot measures 5.1 cm transverse by 1.5 cm craniocaudal by approximately 6.5 cm long and is centered over the second, third and fourth metatarsals. No septations or gas within the blister are identified. A second much smaller blister measuring 0.7 cm transverse by 0.3 cm craniocaudal by approximately 2.0 cm long is seen centered over the medial cuneiform in the dorsal soft tissues. No soft tissue gas including gas dissecting within fascial planes is seen. Fat planes within muscle are preserved. IMPRESSION: Subcutaneous edema about the left lower leg increases in intensity distally and is most consistent with cellulitis given the patient's history. 2 cutaneous fluid collections over the dorsum of the foot  are consistent with blisters as described in the patient's history and could be septic or aseptic. Negative for evidence of myositis, fasciitis or osteomyelitis. Electronically Signed   By: Inge Rise M.D.   On: 10/07/2016 07:21    Assessment:  The patient is a 60 y.o. gentleman with chronic thrombocytopenia admitted with sepsis secondary to left lower extremity cellulitis.   Bone marrow on 08/06/2016 revealed no evidence of leukemia, lymphoma, or myelodysplasia.  Cytopenias were felt likely multi-factorial and due to chronic disease and drug/medication effect.  He was on Depakote which has a 1-27% incidence of thrombocytopenia (dose related).    He has chronic kidney disease (CKD) associated with a 30 year history of Lithium use (stoped in the 1990s).  Creatinine had been stable between 2.04-2.38 (CrCl 28-31 ml/min).   Creatinine is currently 5.11.  Peripheral smear reveals no evidence of schistocytes.  Platelet count prior to admission was 63,000 - 82,000. Platelet count on admission was 42,000.  Platelet count was 18,000 on 10/05/2016, 22,000 on 10/06/2016 and 33,000 on 110/07/2016.   Depakote was discontinued.  Plan:   1.  Hematology:  Chronic thrombocytopenia with drop in platelet count due to consumption with current infection.  Depakote discontinued.  Platelet count appears to be improving.  No schistocytes on peripheral smear.  No evidence of TTP.  Follow CBC daily.  If needed, could transfuse platelets.  No current evidence of bleeding.  If platelets transfused, would check 1 hour post platelet count.   Thank you for allowing me to participate in Brandon Maynard. 's care.  I will follow himclosely with you while hospitalized and after discharge in the outpatient department.  Brandon Asal, MD  10/07/2016

## 2016-10-08 NOTE — Progress Notes (Signed)
[place] [facility] [place] NAME: Brandon Maynard    MR#:  [id number]  DATE OF BIRTH:  Apr 13, 1956  SUBJECTIVE:  Came in with left leg swelling and erythema Pt irritable today . Code 300 was called earlier. Wants to ist in the chair REVIEW OF SYSTEMS:   Review of Systems  Constitutional: Positive for fever. Negative for chills and weight loss.  HENT: Negative for ear discharge, ear pain and nosebleeds.   Eyes: Negative for blurred vision, pain and discharge.  Respiratory: Negative for sputum production, shortness of breath, wheezing and stridor.   Cardiovascular: Positive for leg swelling. Negative for chest pain, palpitations, orthopnea and PND.  Gastrointestinal: Negative for abdominal pain, diarrhea, nausea and vomiting.  Genitourinary: Negative for frequency and urgency.  Musculoskeletal: Positive for joint pain. Negative for back pain.  Neurological: Positive for weakness. Negative for sensory change, speech change and focal weakness.  Psychiatric/Behavioral: Negative for depression and hallucinations. The patient is not nervous/anxious.    Tolerating Diet:yes Tolerating PT: pending  DRUG ALLERGIES:   Allergies  Allergen Reactions  . Tetracyclines & Related Hives    VITALS:  Blood pressure 120/64, pulse 73, temperature 98.1 F (36.7 C), temperature source Oral, resp. rate 16, height 6\' 3"  (1.905 m), weight 87.5 kg (192 lb 14.4 oz), SpO2 100 %.  PHYSICAL EXAMINATION:   Physical Exam  GENERAL:  60 y.o.-year-old patient lying in the bed with no acute distress.  EYES: Pupils equal, round, reactive to light and accommodation. No scleral icterus. Extraocular muscles intact.  HEENT: Head atraumatic, normocephalic. Oropharynx and nasopharynx clear.  NECK:  Supple, no jugular venous distention. No thyroid enlargement, no tenderness.  LUNGS: Normal breath sounds bilaterally, no wheezing, rales, rhonchi. No use of accessory muscles of  respiration.  CARDIOVASCULAR: S1, S2 normal. No murmurs, rubs, or gallops.  ABDOMEN: Soft, nontender, nondistended. Bowel sounds present. No organomegaly or mass.  EXTREMITIES: bilateral chronic edema with left leg swelling and large blister+ with erythema improving NEUROLOGIC: Cranial nerves II through XII are intact. No focal Motor or sensory deficits b/l.   PSYCHIATRIC:  patient is alert but falls asleep instantly and intermittent confusion SKIN: Nas above  LABORATORY PANEL:  CBC  Recent Labs Lab 10/08/16 0721  WBC 24.3*  HGB 9.7*  HCT 29.4*  PLT 40*    Chemistries   Recent Labs Lab 10/05/16 0406  10/08/16 0721  NA 130*  < > 135  K 4.2  < > 4.0  CL 101  < > 108  CO2 18*  < > 20*  GLUCOSE 90  < > 84  BUN 96*  < > 109*  CREATININE 4.53*  < > 5.07*  CALCIUM 8.1*  < > 8.1*  AST 59*  --   --   ALT 22  --   --   ALKPHOS 43  --   --   BILITOT 0.6  --   --   < > = values in this interval not displayed. Cardiac Enzymes No results for input(s): TROPONINI in the last 168 hours. RADIOLOGY:  Ct Tibia Fibula Left Wo Contrast  Result Date: 10/07/2016 CLINICAL DATA:  Left lower extremity pain, swelling and redness with a large blister on the dorsal aspect of the left foot of unknown duration. Elevated white blood cell count and hypotension. EXAM: CT TIBIA FIBULA LEFT WITHOUT CONTRAST TECHNIQUE: Multidetector CT imaging was performed according to the standard protocol. Multiplanar CT image reconstructions were also generated. COMPARISON:  None.  FINDINGS: Imaged bones appear normal without destructive change or periosteal reaction. There is no fracture or dislocation. No evidence of arthropathy. Very small bone island in the medial talus is noted. Subcutaneous edema diffusely about the lower leg increases in intensity about the ankle and foot. A large blister type lesion in the cutaneous tissues over the dorsum of the foot measures 5.1 cm transverse by 1.5 cm craniocaudal by  approximately 6.5 cm long and is centered over the second, third and fourth metatarsals. No septations or gas within the blister are identified. A second much smaller blister measuring 0.7 cm transverse by 0.3 cm craniocaudal by approximately 2.0 cm long is seen centered over the medial cuneiform in the dorsal soft tissues. No soft tissue gas including gas dissecting within fascial planes is seen. Fat planes within muscle are preserved. IMPRESSION: Subcutaneous edema about the left lower leg increases in intensity distally and is most consistent with cellulitis given the patient's history. 2 cutaneous fluid collections over the dorsum of the foot are consistent with blisters as described in the patient's history and could be septic or aseptic. Negative for evidence of myositis, fasciitis or osteomyelitis. Electronically Signed   By: Inge Rise M.D.   On: 10/07/2016 07:21   ASSESSMENT AND PLAN:   Brandon Maynard  is a 60 y.o. male with a known history of Bipolar disorder, cataract, chronic thrombocytopenia is presenting to the ED with a chief complaint of significant worsening of left lower extremity pain, redness and swelling which started with a small blister on the left foot. Denies any insect bites  #Sepsis meets criteria with leukocytosis, elevated lactic acid and hypotension. Secondary to left leg cellulitis IV Zosyn and vancomycin---to IV meropenem since BC ecoli---po keflex ID consult today Pain management Keep legs elevated -Vascular surgery eval noted. Dr Ronalee Belts does not feel any need for intervention for PAD CT tibia-fibula shows inflammation and swelling. No loculated collection of fluid.  #Acute Encephalopathy with  hypotension with elevated wbc and increasingconfusion with increasing BUN and creat Patient will likely need hemodialysis down the road how were according to nephrology patient is declining to start hemodialysis. Psychiatry feels patient is able to make decisions.  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 Spoke with both brothers Marlou Sa and Liliane Channel and they are discussing with pt to see if he changes his mind. Dr Holley Raring aware  #Hyponatremia-probably chronic Provided gentle hydration with IV fluids. Mentating fluctuating 130--134--137  #Thrombocytopenia significant platelet count is 42,000 -plt 42K---18K--22k -multifactorial. Pt has been evaluated by Oncology in the past and appears due to meds/sepsis and CKD -Depakote has been d/ced by Dr Weber Cooks  #DVT prophylaxis with SCDs  #Paranoid schizophrenia/ipolar do -seen by dr clapacs and per him pt is capable of making decisions.  If pt continues to decline HD then will d/c to group home  Case discussed with Care Management/Social Worker. Management plans discussed with the patient, family and they are in agreement.  CODE STATUS:FULL DVT Prophylaxis: SCD TOTAL  criticalTIME TAKING CARE OF THIS PATIENT 40 minutes.  >50% time spent on counselling and coordination of care   Note: This dictation was prepared with Dragon dictation along with smaller phrase technology. Any transcriptional errors that result from this process are unintentional.  Sani Madariaga M.D on 10/08/2016 at 1:14 PM  Between 7am to 6pm - Pager - [phone]  After 6pm go to [email or web address] - password [email or web address] [facility]  Office  [phone]  CC: Primary care physician; [name], MD

## 2016-10-08 NOTE — Clinical Social Work Note (Signed)
Physician spoke with CSW and stated that patient may discharge today. Patient's has a brother: Marlou Sa at [phone] and CSW contacted him to give him a heads up about potential discharge. He requested for the physician to call him. CSW informed physician of this and she called. Marlou Sa decided to have patient's other brother, Liliane Channel: [phone], who lives locally to come and see patient to talk further about hemodialysis and to try and get his brother to agree to it. At this time, physician will follow up over weekend to determine if patient has changed his mind regarding outpatient dialysis. CSW contacted [name] at [employer]: [phone] and informed him that patient would be discharged on oral antibiotics and that there was a possibility that he may decide to do dialysis. I asked [name] if this would be an issue for the group home and he stated that it would not be an issue. Patient not discharging today. Shela Leff MSW,LCSW [phone]

## 2016-10-08 NOTE — Care Management Important Message (Signed)
Important Message  Patient Details  Name: Brandon Maynard. MRN: [id number] Date of Birth: [birth year]   Medicare Important Message Given:  Yes    Beverly Sessions, RN 10/08/2016, 2:03 PM

## 2016-10-08 NOTE — Progress Notes (Signed)
[facility]  ROUNDING NOTE   Subjective:  Renal function remains quite poor. BUN currently 109 and creatinine is 5.07. Patient still unsure as to whether he wants to proceed with dialysis. He would like to elicit his brother's advise.   Objective:  Vital signs in last 24 hours:  Temp:  [98.1 F (36.7 C)-99.3 F (37.4 C)] 98.3 F (36.8 C) (11/10 1410) Pulse Rate:  [68-80] 68 (11/10 1410) Resp:  [16-20] 16 (11/10 0852) BP: (105-120)/(57-66) 118/62 (11/10 1410) SpO2:  [99 %-100 %] 100 % (11/10 0852)  Weight change:  Filed Weights   10/04/16 1500 10/04/16 2025 10/06/16 1200  Weight: 83.9 kg (185 lb) 83.9 kg (185 lb) 87.5 kg (192 lb 14.4 oz)    Intake/Output: I/O last 3 completed shifts: In: 2703 [P.O.:476; I.V.:800; IV Piggyback:150] Out: 2050 [Urine:2050]   Intake/Output this shift:  Total I/O In: 480 [P.O.:480] Out: -   Physical Exam: General: Laying in bed, confused at times  Head: Normocephalic, atraumatic. Moist oral mucosal membranes  Eyes: Anicteric  Neck: Supple, trachea midline  Lungs:  Clear to auscultation, normal effort  Heart: S1S2 no rubs  Abdomen:  Soft, nontender, BS present  Extremities: 2+ LLE, decreasing LLE cellulitis  Neurologic: Awake but confused at times, follows commands  Skin: Area of bullous on left foot has been evacuated       Basic Metabolic Panel:  Recent Labs Lab 10/04/16 1551 10/05/16 0406 10/05/16 1824 10/06/16 0430 10/07/16 0713 10/08/16 0721  NA 127* 130*  --  134* 137 135  K 4.4 4.2  --  4.0 3.7 4.0  CL 94* 101  --  106 109 108  CO2 19* 18*  --  18* 19* 20*  GLUCOSE 99 90  --  116* 96 84  BUN 85* 96*  --  104* 107* 109*  CREATININE 4.33* 4.53*  --  4.66* 5.11* 5.07*  CALCIUM 8.9 8.1*  --  7.9* 8.3* 8.1*  PHOS  --   --  5.0*  --  3.7 3.9    Liver Function Tests:  Recent Labs Lab 10/04/16 1551 10/05/16 0406 10/07/16 0713 10/08/16 0721  AST 53* 59*  --   --   ALT 22 22  --   --   ALKPHOS 70  43  --   --   BILITOT 0.8 0.6  --   --   PROT 6.8 5.0*  --   --   ALBUMIN 2.6* 1.8* 1.6* 1.5*   No results for input(s): LIPASE, AMYLASE in the last 168 hours.  Recent Labs Lab 10/06/16 0430  AMMONIA 29    CBC:  Recent Labs Lab 10/04/16 1551 10/05/16 0406 10/06/16 0430 10/07/16 0713 10/08/16 0721  WBC 12.7* 13.1* 19.4* 24.5* 24.3*  NEUTROABS 11.4*  --   --   --  20.1*  HGB 11.4* 9.0* 9.5* 10.1* 9.7*  HCT 34.0* 26.4* 28.2* 30.2* 29.4*  MCV 92.3 89.8 91.8 93.0 92.7  PLT 42* 18* 22* 33* 40*    Cardiac Enzymes: No results for input(s): CKTOTAL, CKMB, CKMBINDEX, TROPONINI in the last 168 hours.  BNP: Invalid input(s): POCBNP  CBG:  Recent Labs Lab 10/06/16 1153  GLUCAP 107*    Microbiology: Results for orders placed or performed during the hospital encounter of 10/04/16  Blood Culture (routine x 2)     Status: Abnormal   Collection Time: 10/04/16  3:51 PM  Result Value Ref Range Status   Specimen Description BLOOD RT [facility]  Final   Special  Requests BOTTLES DRAWN AEROBIC AND ANAEROBIC 10CC  Final   Culture  Setup Time   Final    Organism ID to follow GRAM NEGATIVE RODS IN BOTH AEROBIC AND ANAEROBIC BOTTLES CRITICAL RESULT CALLED TO, READ BACK BY AND VERIFIED WITH: NATE COOKSON ON 10/05/16 AT 0448 BY TLB CONFIRMED BY TLB    Culture ESCHERICHIA COLI (A)  Final   Report Status 10/07/2016 FINAL  Final   Organism ID, Bacteria ESCHERICHIA COLI  Final      Susceptibility   Escherichia coli - MIC*    AMPICILLIN >=32 RESISTANT Resistant     CEFAZOLIN <=4 SENSITIVE Sensitive     CEFEPIME <=1 SENSITIVE Sensitive     CEFTAZIDIME <=1 SENSITIVE Sensitive     CEFTRIAXONE <=1 SENSITIVE Sensitive     CIPROFLOXACIN <=0.25 SENSITIVE Sensitive     GENTAMICIN <=1 SENSITIVE Sensitive     IMIPENEM <=0.25 SENSITIVE Sensitive     TRIMETH/SULFA >=320 RESISTANT Resistant     AMPICILLIN/SULBACTAM 16 INTERMEDIATE Intermediate     PIP/TAZO <=4 SENSITIVE Sensitive     Extended ESBL  NEGATIVE Sensitive     * ESCHERICHIA COLI  Blood Culture ID Panel (Reflexed)     Status: Abnormal   Collection Time: 10/04/16  3:51 PM  Result Value Ref Range Status   Enterococcus species NOT DETECTED NOT DETECTED Final   Listeria monocytogenes NOT DETECTED NOT DETECTED Final   Staphylococcus species NOT DETECTED NOT DETECTED Final   Staphylococcus aureus NOT DETECTED NOT DETECTED Final   Streptococcus species NOT DETECTED NOT DETECTED Final   Streptococcus agalactiae NOT DETECTED NOT DETECTED Final   Streptococcus pneumoniae NOT DETECTED NOT DETECTED Final   Streptococcus pyogenes NOT DETECTED NOT DETECTED Final   Acinetobacter baumannii NOT DETECTED NOT DETECTED Final   Enterobacteriaceae species DETECTED (A) NOT DETECTED Final    Comment: CRITICAL RESULT CALLED TO, READ BACK BY AND VERIFIED WITH: NATE COOKSON ON 10/05/16 AT 0448 BY TLB    Enterobacter cloacae complex NOT DETECTED NOT DETECTED Final   Escherichia coli DETECTED (A) NOT DETECTED Final    Comment: CRITICAL RESULT CALLED TO, READ BACK BY AND VERIFIED WITH: NATE COOKSON ON 10/05/16 AT 0448 BY TLB                                                                                                Klebsiella oxytoca NOT DETECTED NOT DETECTED Final   Klebsiella pneumoniae NOT DETECTED NOT DETECTED Final   Proteus species NOT DETECTED NOT DETECTED Final   Serratia marcescens NOT DETECTED NOT DETECTED Final   Carbapenem resistance NOT DETECTED NOT DETECTED Final   Haemophilus influenzae NOT DETECTED NOT DETECTED Final   Neisseria meningitidis NOT DETECTED NOT DETECTED Final   Pseudomonas aeruginosa NOT DETECTED NOT DETECTED Final   Candida albicans NOT DETECTED NOT DETECTED Final   Candida glabrata NOT DETECTED NOT DETECTED Final   Candida krusei NOT DETECTED NOT DETECTED Final   Candida parapsilosis NOT DETECTED NOT DETECTED Final   Candida tropicalis NOT DETECTED NOT DETECTED Final  Blood Culture (routine x 2)     Status:  Abnormal  Collection Time: 10/04/16  3:56 PM  Result Value Ref Range Status   Specimen Description BLOOD LT AC  Final   Special Requests BOTTLES DRAWN AEROBIC AND ANAEROBIC 10CC  Final   Culture  Setup Time   Final    GRAM NEGATIVE RODS IN BOTH AEROBIC AND ANAEROBIC BOTTLES CRITICAL RESULT CALLED TO, READ BACK BY AND VERIFIED WITH: NATE COOKSON ON 10/05/16 AT 0448 BY TLB CONFIRMED BY TLB    Culture ESCHERICHIA COLI (A)  Final   Report Status 10/07/2016 FINAL  Final  Urine culture     Status: Abnormal   Collection Time: 10/04/16  9:01 PM  Result Value Ref Range Status   Specimen Description URINE, RANDOM  Final   Special Requests NONE  Final   Culture 30,000 COLONIES/mL ESCHERICHIA COLI (A)  Final   Report Status 10/07/2016 FINAL  Final   Organism ID, Bacteria ESCHERICHIA COLI (A)  Final      Susceptibility   Escherichia coli - MIC*    AMPICILLIN >=32 RESISTANT Resistant     CEFAZOLIN <=4 SENSITIVE Sensitive     CEFTRIAXONE <=1 SENSITIVE Sensitive     CIPROFLOXACIN <=0.25 SENSITIVE Sensitive     GENTAMICIN <=1 SENSITIVE Sensitive     IMIPENEM <=0.25 SENSITIVE Sensitive     NITROFURANTOIN <=16 SENSITIVE Sensitive     TRIMETH/SULFA >=320 RESISTANT Resistant     AMPICILLIN/SULBACTAM 16 INTERMEDIATE Intermediate     PIP/TAZO <=4 SENSITIVE Sensitive     Extended ESBL NEGATIVE Sensitive     * 30,000 COLONIES/mL ESCHERICHIA COLI  MRSA PCR Screening     Status: None   Collection Time: 10/04/16  9:01 PM  Result Value Ref Range Status   MRSA by PCR NEGATIVE NEGATIVE Final    Comment:        The GeneXpert MRSA Assay (FDA approved for NASAL specimens only), is one component of a comprehensive MRSA colonization surveillance program. It is not intended to diagnose MRSA infection nor to guide or monitor treatment for MRSA infections.   MRSA PCR Screening     Status: None   Collection Time: 10/06/16 11:59 AM  Result Value Ref Range Status   MRSA by PCR NEGATIVE NEGATIVE Final     Comment:        The GeneXpert MRSA Assay (FDA approved for NASAL specimens only), is one component of a comprehensive MRSA colonization surveillance program. It is not intended to diagnose MRSA infection nor to guide or monitor treatment for MRSA infections.     Coagulation Studies: No results for input(s): LABPROT, INR in the last 72 hours.  Urinalysis: No results for input(s): COLORURINE, LABSPEC, PHURINE, GLUCOSEU, HGBUR, BILIRUBINUR, KETONESUR, PROTEINUR, UROBILINOGEN, NITRITE, LEUKOCYTESUR in the last 72 hours.  Invalid input(s): APPERANCEUR    Imaging: Ct Tibia Fibula Left Wo Contrast  Result Date: 10/07/2016 CLINICAL DATA:  Left lower extremity pain, swelling and redness with a large blister on the dorsal aspect of the left foot of unknown duration. Elevated white blood cell count and hypotension. EXAM: CT TIBIA FIBULA LEFT WITHOUT CONTRAST TECHNIQUE: Multidetector CT imaging was performed according to the standard protocol. Multiplanar CT image reconstructions were also generated. COMPARISON:  None. FINDINGS: Imaged bones appear normal without destructive change or periosteal reaction. There is no fracture or dislocation. No evidence of arthropathy. Very small bone island in the medial talus is noted. Subcutaneous edema diffusely about the lower leg increases in intensity about the ankle and foot. A large blister type lesion in the cutaneous tissues over  the dorsum of the foot measures 5.1 cm transverse by 1.5 cm craniocaudal by approximately 6.5 cm long and is centered over the second, third and fourth metatarsals. No septations or gas within the blister are identified. A second much smaller blister measuring 0.7 cm transverse by 0.3 cm craniocaudal by approximately 2.0 cm long is seen centered over the medial cuneiform in the dorsal soft tissues. No soft tissue gas including gas dissecting within fascial planes is seen. Fat planes within muscle are preserved. IMPRESSION:  Subcutaneous edema about the left lower leg increases in intensity distally and is most consistent with cellulitis given the patient's history. 2 cutaneous fluid collections over the dorsum of the foot are consistent with blisters as described in the patient's history and could be septic or aseptic. Negative for evidence of myositis, fasciitis or osteomyelitis. Electronically Signed   By: Inge Rise M.D.   On: 10/07/2016 07:21     Medications:    . asenapine  10 mg Sublingual QHS  . asenapine  5 mg Sublingual q morning - 10a  . brimonidine  1 drop Both Eyes Q12H   And  . timolol  1 drop Both Eyes Q12H  . cephALEXin  500 mg Oral Q12H  . docusate sodium  100 mg Oral BID  . FLUoxetine  40 mg Oral Daily  . latanoprost  1 drop Both Eyes QHS  . levothyroxine  50 mcg Oral QAC breakfast  . multivitamin with minerals  1 tablet Oral Daily  . pyridOXINE  100 mg Oral BID  . simvastatin  20 mg Oral QHS  . traZODone  100 mg Oral QHS   acetaminophen, haloperidol lactate, ondansetron **OR** ondansetron (ZOFRAN) IV, oxyCODONE  Assessment/ Plan:  60 y.o. male with a PMHx of anemia, bipolar disorder, degenerative disc disease, GERD, hyperlipidemia, hypertension, paranoid schizophrenia, who was admitted to [facility] on 10/04/2016 for evaluation of left leg swelling and redness.   1.  Acute renal failure/CKD stage III baseline Cr 2.1 egfr 32/proteinuria:  Patient admitted with left lower external a cellulitis as well as hypotension.  Suspect that acute renal failure is related to hypotension and subsequent ischemic changes.  - urine output actually appears to have improved at 2 L.  However her BUN remains quite high at 109 with a creatinine of 5.07.  We would still recommend a short course of hemodialysis.  Patient continues to decline.  He would like to elicit the advice from his brother.  2.  Hypotension.   improved today.  Blood pressure currently 118/62.  No longer requiring norepinephrine.  3.   Left lower extremity cellulitis.  Cellulitis continues to improve with broad-spectrum antibiotic therapy.  Further management perinfectious disease and hospitalist.   LOS: 4 Sharlotte Baka 11/10/20172:32 PM

## 2016-10-09 DIAGNOSIS — N189 Chronic kidney disease, unspecified: Secondary | ICD-10-CM

## 2016-10-09 LAB — BASIC METABOLIC PANEL
Anion gap: 8 (ref 5–15)
BUN: 109 mg/dL — AB (ref 6–20)
CHLORIDE: 108 mmol/L (ref 101–111)
CO2: 18 mmol/L — AB (ref 22–32)
CREATININE: 5.47 mg/dL — AB (ref 0.61–1.24)
Calcium: 7.7 mg/dL — ABNORMAL LOW (ref 8.9–10.3)
GFR calc Af Amer: 12 mL/min — ABNORMAL LOW (ref >60)
GFR calc non Af Amer: 10 mL/min — ABNORMAL LOW (ref >60)
GLUCOSE: 141 mg/dL — AB (ref 65–99)
Potassium: 4.4 mmol/L (ref 3.5–5.1)
SODIUM: 134 mmol/L — AB (ref 135–145)

## 2016-10-09 LAB — PHOSPHORUS: Phosphorus: 5 mg/dL — ABNORMAL HIGH (ref 2.5–4.6)

## 2016-10-09 MED ORDER — LIDOCAINE-PRILOCAINE 2.5-2.5 % EX CREA
1.0000 "application " | TOPICAL_CREAM | CUTANEOUS | Status: DC | PRN
  Filled 2016-10-09: qty 5

## 2016-10-09 MED ORDER — PENTAFLUOROPROP-TETRAFLUOROETH EX AERO
1.0000 "application " | INHALATION_SPRAY | CUTANEOUS | Status: DC | PRN
  Filled 2016-10-09: qty 30

## 2016-10-09 MED ORDER — SODIUM CHLORIDE 0.9 % IV SOLN: 100.0000 mL | INTRAVENOUS | Status: DC | PRN

## 2016-10-09 MED ORDER — LIDOCAINE HCL (PF) 1 % IJ SOLN
5.0000 mL | INTRAMUSCULAR | Status: DC | PRN
  Filled 2016-10-09: qty 5

## 2016-10-09 MED ORDER — ALTEPLASE 2 MG IJ SOLR: 2.0000 mg | Freq: Once | INTRAMUSCULAR | Status: DC | PRN

## 2016-10-09 MED ORDER — HEPARIN SODIUM (PORCINE) 1000 UNIT/ML DIALYSIS
1000.0000 [IU] | INTRAMUSCULAR | Status: DC | PRN
  Filled 2016-10-09: qty 1

## 2016-10-09 NOTE — Progress Notes (Signed)
Pre Dialysis 

## 2016-10-09 NOTE — Progress Notes (Signed)
Post dialysis 

## 2016-10-09 NOTE — Progress Notes (Signed)
[place] at [place] NAME: Brandon Maynard    MR#:  [id number]  DATE OF BIRTH:  1956-04-17  SUBJECTIVE:  Came in with left leg swelling and erythema Pt irritable today . Very tangential and restless REVIEW OF SYSTEMS:   Review of Systems  Constitutional: Positive for fever. Negative for chills and weight loss.  HENT: Negative for ear discharge, ear pain and nosebleeds.   Eyes: Negative for blurred vision, pain and discharge.  Respiratory: Negative for sputum production, shortness of breath, wheezing and stridor.   Cardiovascular: Positive for leg swelling. Negative for chest pain, palpitations, orthopnea and PND.  Gastrointestinal: Negative for abdominal pain, diarrhea, nausea and vomiting.  Genitourinary: Negative for frequency and urgency.  Musculoskeletal: Positive for joint pain. Negative for back pain.  Neurological: Positive for weakness. Negative for sensory change, speech change and focal weakness.  Psychiatric/Behavioral: Negative for depression and hallucinations. The patient is not nervous/anxious.    Tolerating Diet:yes Tolerating PT: pending  DRUG ALLERGIES:   Allergies  Allergen Reactions  . Tetracyclines & Related Hives    VITALS:  Blood pressure 115/66, pulse 73, temperature 97.9 F (36.6 C), temperature source Oral, resp. rate 18, height 6\' 3"  (1.905 m), weight 87.5 kg (192 lb 14.4 oz), SpO2 98 %.  PHYSICAL EXAMINATION:   Physical Exam  GENERAL:  60 y.o.-year-old patient lying in the bed with no acute distress.  EYES: Pupils equal, round, reactive to light and accommodation. No scleral icterus. Extraocular muscles intact.  HEENT: Head atraumatic, normocephalic. Oropharynx and nasopharynx clear.  NECK:  Supple, no jugular venous distention. No thyroid enlargement, no tenderness.  LUNGS: Normal breath sounds bilaterally, no wheezing, rales, rhonchi. No use of accessory muscles of respiration.  CARDIOVASCULAR: S1,  S2 normal. No murmurs, rubs, or gallops.  ABDOMEN: Soft, nontender, nondistended. Bowel sounds present. No organomegaly or mass.  EXTREMITIES: bilateral chronic edema with left leg swelling and large blister+ with erythema improving NEUROLOGIC: Cranial nerves II through XII are intact. No focal Motor or sensory deficits b/l.   PSYCHIATRIC:  patient is alert but falls asleep instantly and intermittent confusion SKIN: Nas above  LABORATORY PANEL:  CBC  Recent Labs Lab 10/08/16 0721  WBC 24.3*  HGB 9.7*  HCT 29.4*  PLT 40*    Chemistries   Recent Labs Lab 10/05/16 0406  10/08/16 0721  NA 130*  < > 135  K 4.2  < > 4.0  CL 101  < > 108  CO2 18*  < > 20*  GLUCOSE 90  < > 84  BUN 96*  < > 109*  CREATININE 4.53*  < > 5.07*  CALCIUM 8.1*  < > 8.1*  AST 59*  --   --   ALT 22  --   --   ALKPHOS 43  --   --   BILITOT 0.6  --   --   < > = values in this interval not displayed. Cardiac Enzymes No results for input(s): TROPONINI in the last 168 hours. RADIOLOGY:  No results found. ASSESSMENT AND PLAN:   Brandon Maynard  is a 60 y.o. male with a known history of Bipolar disorder, cataract, chronic thrombocytopenia is presenting to the ED with a chief complaint of significant worsening of left lower extremity pain, redness and swelling which started with a small blister on the left foot. Denies any insect bites  #Sepsis meets criteria with leukocytosis, elevated lactic acid and hypotension. Secondary to left leg cellulitis IV  Zosyn and vancomycin---to IV meropenem since BC ecoli---po keflex (total 10 days) Pain management Keep legs elevated -Vascular surgery eval noted. Dr Ronalee Belts does not feel any need for intervention for PAD CT tibia-fibula shows inflammation and swelling. No loculated collection of fluid.  #Acute Encephalopathy with  hypotension with elevated wbc and increasingconfusion with increasing BUN and creat Patient will need hemodialysis on emergent basis. 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 Spoke with both brothers Brandon Maynard and Brandon Maynard and they are discussing with pt to see if he changes his mind. Dr Holley Raring aware  #Hyponatremia-probably chronic Provided gentle hydration with IV fluids. Mentating fluctuating 130--134--137  #Thrombocytopenia significant platelet count is 42,000 -plt 42K---18K--22k -multifactorial. Pt has been evaluated by Oncology in the past and appears due to meds/sepsis and CKD -Depakote has been d/ced by Dr Weber Cooks  #DVT prophylaxis with SCDs  #Paranoid schizophrenia/ipolar do -seen by dr clapacs  Case discussed with Care Management/Social Worker. Management plans discussed with the patient, family and they are in agreement.  CODE STATUS:FULL DVT Prophylaxis: SCD TOTAL  criticalTIME TAKING CARE OF THIS PATIENT 40 minutes.  >50% time spent on counselling and coordination of care   Note: This dictation was prepared with Dragon dictation along with smaller phrase technology. Any transcriptional errors that result from this process are unintentional.  Brandon Maynard M.D on 10/09/2016 at 11:48 AM  Between 7am to 6pm - Pager - [phone]  After 6pm go to [email or web address] - password [email or web address]  [name]  Office  [phone]  CC: Primary care physician; Brandon Miu, MD

## 2016-10-09 NOTE — Op Note (Signed)
  OPERATIVE NOTE   PROCEDURE: 1. Insertion of temporary dialysis catheter catheter right femoral approach.  PRE-OPERATIVE DIAGNOSIS: acute on chronic renal failure  POST-OPERATIVE DIAGNOSIS: same  SURGEON: Katha Cabal M.D.  ANESTHESIA: 1% lidocaine local infiltration  ESTIMATED BLOOD LOSS: Minimal cc  INDICATIONS:   Brandon Maynard. is a 60 y.o. male who presents with acute on chronic renal failure and will now be starting diayisis.  DESCRIPTION: After obtaining full informed written consent, the patient was positioned supine. The right thigh was prepped and draped in a sterile fashion. Ultrasound was placed in a sterile sleeve. Ultrasound was utilized to identify the right femoral vein which is noted to be echolucent and compressible indicating patency. Images recorded for the permanent record. Under real-time visualization a Seldinger needle is inserted into the vein and the guidewires advanced without difficulty. Small counterincision was made at the wire insertion site. Dilator is passed over the wire and the temporary dialysis catheter catheter is fed over the wire without difficulty.  All lumens aspirate and flush easily and are packed with heparin saline. Catheter secured to the skin of the right thigh with 2-0 silk. A sterile dressing is applied with Biopatch.  COMPLICATIONS: None  CONDITION: Unchanged  Brandon Maynard Office:  [phone] 10/09/2016, 3:16 PM

## 2016-10-09 NOTE — Progress Notes (Signed)
[facility]  ROUNDING NOTE   Subjective:  Renal function remains quite poor. BUN currently 109 with a creatinine of 5.47. Serologic workup found to be negative.   Objective:  Vital signs in last 24 hours:  Temp:  [97.9 F (36.6 C)-98.3 F (36.8 C)] 98.2 F (36.8 C) (11/11 1222) Pulse Rate:  [68-73] 72 (11/11 1222) Resp:  [18] 18 (11/11 1222) BP: (115-118)/(62-66) 116/66 (11/11 1222) SpO2:  [98 %] 98 % (11/11 1222)  Weight change:  Filed Weights   10/04/16 1500 10/04/16 2025 10/06/16 1200  Weight: 83.9 kg (185 lb) 83.9 kg (185 lb) 87.5 kg (192 lb 14.4 oz)    Intake/Output: I/O last 3 completed shifts: In: 9 [P.O.:836] Out: 2250 [Urine:2250]   Intake/Output this shift:  Total I/O In: -  Out: 750 [Urine:750]  Physical Exam: General: Laying in bed, Lethargic but arousable   Head: Normocephalic, atraumatic. Moist oral mucosal membranes  Eyes: Anicteric  Neck: Supple, trachea midline  Lungs:  Clear to auscultation, normal effort  Heart: S1S2 no rubs  Abdomen:  Soft, nontender, BS present  Extremities: 2+ LLE, decreasing LLE cellulitis  Neurologic: Awake but confused at times, follows commands  Skin: Area of bullous on left foot        Basic Metabolic Panel:  Recent Labs Lab 10/05/16 0406 10/05/16 1824 10/06/16 0430 10/07/16 0713 10/08/16 0721 10/09/16 1048  NA 130*  --  134* 137 135 134*  K 4.2  --  4.0 3.7 4.0 4.4  CL 101  --  106 109 108 108  CO2 18*  --  18* 19* 20* 18*  GLUCOSE 90  --  116* 96 84 141*  BUN 96*  --  104* 107* 109* 109*  CREATININE 4.53*  --  4.66* 5.11* 5.07* 5.47*  CALCIUM 8.1*  --  7.9* 8.3* 8.1* 7.7*  PHOS  --  5.0*  --  3.7 3.9  --     Liver Function Tests:  Recent Labs Lab 10/04/16 1551 10/05/16 0406 10/07/16 0713 10/08/16 0721  AST 53* 59*  --   --   ALT 22 22  --   --   ALKPHOS 70 43  --   --   BILITOT 0.8 0.6  --   --   PROT 6.8 5.0*  --   --   ALBUMIN 2.6* 1.8* 1.6* 1.5*   No results for  input(s): LIPASE, AMYLASE in the last 168 hours.  Recent Labs Lab 10/06/16 0430  AMMONIA 29    CBC:  Recent Labs Lab 10/04/16 1551 10/05/16 0406 10/06/16 0430 10/07/16 0713 10/08/16 0721  WBC 12.7* 13.1* 19.4* 24.5* 24.3*  NEUTROABS 11.4*  --   --   --  20.1*  HGB 11.4* 9.0* 9.5* 10.1* 9.7*  HCT 34.0* 26.4* 28.2* 30.2* 29.4*  MCV 92.3 89.8 91.8 93.0 92.7  PLT 42* 18* 22* 33* 40*    Cardiac Enzymes: No results for input(s): CKTOTAL, CKMB, CKMBINDEX, TROPONINI in the last 168 hours.  BNP: Invalid input(s): POCBNP  CBG:  Recent Labs Lab 10/06/16 1153  GLUCAP 107*    Microbiology: Results for orders placed or performed during the hospital encounter of 10/04/16  Blood Culture (routine x 2)     Status: Abnormal   Collection Time: 10/04/16  3:51 PM  Result Value Ref Range Status   Specimen Description BLOOD RT [facility]  Final   Special Requests BOTTLES DRAWN AEROBIC AND ANAEROBIC 10CC  Final   Culture  Setup Time   Final  Organism ID to follow GRAM NEGATIVE RODS IN BOTH AEROBIC AND ANAEROBIC BOTTLES CRITICAL RESULT CALLED TO, READ BACK BY AND VERIFIED WITH: NATE COOKSON ON 10/05/16 AT 0448 BY TLB CONFIRMED BY TLB    Culture ESCHERICHIA COLI (A)  Final   Report Status 10/07/2016 FINAL  Final   Organism ID, Bacteria ESCHERICHIA COLI  Final      Susceptibility   Escherichia coli - MIC*    AMPICILLIN >=32 RESISTANT Resistant     CEFAZOLIN <=4 SENSITIVE Sensitive     CEFEPIME <=1 SENSITIVE Sensitive     CEFTAZIDIME <=1 SENSITIVE Sensitive     CEFTRIAXONE <=1 SENSITIVE Sensitive     CIPROFLOXACIN <=0.25 SENSITIVE Sensitive     GENTAMICIN <=1 SENSITIVE Sensitive     IMIPENEM <=0.25 SENSITIVE Sensitive     TRIMETH/SULFA >=320 RESISTANT Resistant     AMPICILLIN/SULBACTAM 16 INTERMEDIATE Intermediate     PIP/TAZO <=4 SENSITIVE Sensitive     Extended ESBL NEGATIVE Sensitive     * ESCHERICHIA COLI  Blood Culture ID Panel (Reflexed)     Status: Abnormal   Collection  Time: 10/04/16  3:51 PM  Result Value Ref Range Status   Enterococcus species NOT DETECTED NOT DETECTED Final   Listeria monocytogenes NOT DETECTED NOT DETECTED Final   Staphylococcus species NOT DETECTED NOT DETECTED Final   Staphylococcus aureus NOT DETECTED NOT DETECTED Final   Streptococcus species NOT DETECTED NOT DETECTED Final   Streptococcus agalactiae NOT DETECTED NOT DETECTED Final   Streptococcus pneumoniae NOT DETECTED NOT DETECTED Final   Streptococcus pyogenes NOT DETECTED NOT DETECTED Final   Acinetobacter baumannii NOT DETECTED NOT DETECTED Final   Enterobacteriaceae species DETECTED (A) NOT DETECTED Final    Comment: CRITICAL RESULT CALLED TO, READ BACK BY AND VERIFIED WITH: NATE COOKSON ON 10/05/16 AT 0448 BY TLB    Enterobacter cloacae complex NOT DETECTED NOT DETECTED Final   Escherichia coli DETECTED (A) NOT DETECTED Final    Comment: CRITICAL RESULT CALLED TO, READ BACK BY AND VERIFIED WITH: NATE COOKSON ON 10/05/16 AT 0448 BY TLB                                                                                                Klebsiella oxytoca NOT DETECTED NOT DETECTED Final   Klebsiella pneumoniae NOT DETECTED NOT DETECTED Final   Proteus species NOT DETECTED NOT DETECTED Final   Serratia marcescens NOT DETECTED NOT DETECTED Final   Carbapenem resistance NOT DETECTED NOT DETECTED Final   Haemophilus influenzae NOT DETECTED NOT DETECTED Final   Neisseria meningitidis NOT DETECTED NOT DETECTED Final   Pseudomonas aeruginosa NOT DETECTED NOT DETECTED Final   Candida albicans NOT DETECTED NOT DETECTED Final   Candida glabrata NOT DETECTED NOT DETECTED Final   Candida krusei NOT DETECTED NOT DETECTED Final   Candida parapsilosis NOT DETECTED NOT DETECTED Final   Candida tropicalis NOT DETECTED NOT DETECTED Final  Blood Culture (routine x 2)     Status: Abnormal   Collection Time: 10/04/16  3:56 PM  Result Value Ref Range Status   Specimen Description BLOOD LT [facility]  Final   Special Requests BOTTLES DRAWN AEROBIC AND ANAEROBIC 10CC  Final   Culture  Setup Time   Final    GRAM NEGATIVE RODS IN BOTH AEROBIC AND ANAEROBIC BOTTLES CRITICAL RESULT CALLED TO, READ BACK BY AND VERIFIED WITH: NATE COOKSON ON 10/05/16 AT 0448 BY TLB CONFIRMED BY TLB    Culture ESCHERICHIA COLI (A)  Final   Report Status 10/07/2016 FINAL  Final  Urine culture     Status: Abnormal   Collection Time: 10/04/16  9:01 PM  Result Value Ref Range Status   Specimen Description URINE, RANDOM  Final   Special Requests NONE  Final   Culture 30,000 COLONIES/mL ESCHERICHIA COLI (A)  Final   Report Status 10/07/2016 FINAL  Final   Organism ID, Bacteria ESCHERICHIA COLI (A)  Final      Susceptibility   Escherichia coli - MIC*    AMPICILLIN >=32 RESISTANT Resistant     CEFAZOLIN <=4 SENSITIVE Sensitive     CEFTRIAXONE <=1 SENSITIVE Sensitive     CIPROFLOXACIN <=0.25 SENSITIVE Sensitive     GENTAMICIN <=1 SENSITIVE Sensitive     IMIPENEM <=0.25 SENSITIVE Sensitive     NITROFURANTOIN <=16 SENSITIVE Sensitive     TRIMETH/SULFA >=320 RESISTANT Resistant     AMPICILLIN/SULBACTAM 16 INTERMEDIATE Intermediate     PIP/TAZO <=4 SENSITIVE Sensitive     Extended ESBL NEGATIVE Sensitive     * 30,000 COLONIES/mL ESCHERICHIA COLI  MRSA PCR Screening     Status: None   Collection Time: 10/04/16  9:01 PM  Result Value Ref Range Status   MRSA by PCR NEGATIVE NEGATIVE Final    Comment:        The GeneXpert MRSA Assay (FDA approved for NASAL specimens only), is one component of a comprehensive MRSA colonization surveillance program. It is not intended to diagnose MRSA infection nor to guide or monitor treatment for MRSA infections.   MRSA PCR Screening     Status: None   Collection Time: 10/06/16 11:59 AM  Result Value Ref Range Status   MRSA by PCR NEGATIVE NEGATIVE Final    Comment:        The GeneXpert MRSA Assay (FDA approved for NASAL specimens only), is one component of  a comprehensive MRSA colonization surveillance program. It is not intended to diagnose MRSA infection nor to guide or monitor treatment for MRSA infections.     Coagulation Studies: No results for input(s): LABPROT, INR in the last 72 hours.  Urinalysis: No results for input(s): COLORURINE, LABSPEC, PHURINE, GLUCOSEU, HGBUR, BILIRUBINUR, KETONESUR, PROTEINUR, UROBILINOGEN, NITRITE, LEUKOCYTESUR in the last 72 hours.  Invalid input(s): APPERANCEUR    Imaging: No results found.   Medications:    . asenapine  10 mg Sublingual QHS  . asenapine  5 mg Sublingual q morning - 10a  . brimonidine  1 drop Both Eyes Q12H   And  . timolol  1 drop Both Eyes Q12H  . cephALEXin  500 mg Oral Q12H  . docusate sodium  100 mg Oral BID  . FLUoxetine  40 mg Oral Daily  . latanoprost  1 drop Both Eyes QHS  . levothyroxine  50 mcg Oral QAC breakfast  . multivitamin with minerals  1 tablet Oral Daily  . pyridOXINE  100 mg Oral BID  . simvastatin  20 mg Oral QHS  . traZODone  100 mg Oral QHS   acetaminophen, haloperidol lactate, ondansetron **OR** ondansetron (ZOFRAN) IV, oxyCODONE  Assessment/ Plan:  60 y.o. male with a PMHx  of anemia, bipolar disorder, degenerative disc disease, GERD, hyperlipidemia, hypertension, paranoid schizophrenia, who was admitted to [facility] on 10/04/2016 for evaluation of left leg swelling and redness.   1.  Acute renal failure/CKD stage III baseline Cr 2.1 egfr 32/proteinuria:  Patient admitted with left lower external a cellulitis as well as hypotension.  Suspect that acute renal failure is related to hypotension and subsequent ischemic changes.  - Psychiatry reevaluated the patient and now feels that he is unable to make decisions for himself. Patient's brother at bedside and consents to temp read Allises catheter placement as well as proceeding with hemodialysis.  Serologic workup found to be negative.  2.  Hypotension.   improved with conservative management.  Continue to monitor blood pressure trend.  3.  Left lower extremity cellulitis.  Cellulitis continues to recede based upon exam.  On cephlexin now.  Continue to monitor progress.     LOS: 5 Jordynne Mccown 11/11/201712:47 PM

## 2016-10-09 NOTE — Progress Notes (Signed)
Dialysis started 

## 2016-10-09 NOTE — Progress Notes (Signed)
Dialysis complete

## 2016-10-09 NOTE — Progress Notes (Signed)
Pt combative and agitated. Disoriented to situation.  OOB in chair, refusing to get in bed despite risks explained with femoral catheter. Haldol administered with assistance from security and pt assisted back to bed without incident. Will monitor response.

## 2016-10-10 DIAGNOSIS — L03116 Cellulitis of left lower limb: Secondary | ICD-10-CM

## 2016-10-10 LAB — RENAL FUNCTION PANEL
ALBUMIN: 1.4 g/dL — AB (ref 3.5–5.0)
ANION GAP: 8 (ref 5–15)
BUN: 93 mg/dL — ABNORMAL HIGH (ref 6–20)
CALCIUM: 7.6 mg/dL — AB (ref 8.9–10.3)
CO2: 21 mmol/L — ABNORMAL LOW (ref 22–32)
Chloride: 109 mmol/L (ref 101–111)
Creatinine, Ser: 4.81 mg/dL — ABNORMAL HIGH (ref 0.61–1.24)
GFR calc Af Amer: 14 mL/min — ABNORMAL LOW (ref >60)
GFR, EST NON AFRICAN AMERICAN: 12 mL/min — AB (ref >60)
Glucose, Bld: 116 mg/dL — ABNORMAL HIGH (ref 65–99)
PHOSPHORUS: 5.6 mg/dL — AB (ref 2.5–4.6)
POTASSIUM: 4.5 mmol/L (ref 3.5–5.1)
SODIUM: 138 mmol/L (ref 135–145)

## 2016-10-10 MED ORDER — VANCOMYCIN HCL IN DEXTROSE 1-5 GM/200ML-% IV SOLN
1000.0000 mg | INTRAVENOUS | Status: DC | PRN
  Filled 2016-10-10: qty 200

## 2016-10-10 MED ORDER — SILVER SULFADIAZINE 1 % EX CREA
TOPICAL_CREAM | Freq: Two times a day (BID) | CUTANEOUS | Status: DC
  Administered 2016-10-10 – 2016-10-22 (×24): via TOPICAL
  Filled 2016-10-10: qty 25
  Filled 2016-10-10: qty 85
  Filled 2016-10-10: qty 25

## 2016-10-10 MED ORDER — VANCOMYCIN HCL 10 G IV SOLR
2000.0000 mg | Freq: Once | INTRAVENOUS | Status: AC
  Administered 2016-10-10: 2000 mg via INTRAVENOUS
  Filled 2016-10-10: qty 2000

## 2016-10-10 MED ORDER — SILVER SULFADIAZINE 1 % EX CREA
TOPICAL_CREAM | Freq: Two times a day (BID) | CUTANEOUS | Status: DC
  Filled 2016-10-10: qty 85

## 2016-10-10 MED ORDER — SODIUM CHLORIDE 0.9 % IV SOLN
500.0000 mg | INTRAVENOUS | Status: DC
  Administered 2016-10-10: 500 mg via INTRAVENOUS
  Filled 2016-10-10 (×2): qty 0.5

## 2016-10-10 NOTE — Progress Notes (Signed)
Dialysis complete

## 2016-10-10 NOTE — Progress Notes (Signed)
[facility]  Daily Progress Note   Subjective  - I am asked to reevaluate the patient regarding his left leg cellulitis by Dr. Posey Pronto.  The patient was initiated on dialysis yesterday. He is still very lethargic and poorly communicative.  He has not been complaining of pain.   Objective Vitals:   10/10/16 1455 10/10/16 1515 10/10/16 1530 10/10/16 1600  BP: 122/64 128/68 121/73 133/63  Pulse: 70 77 74 78  Resp: 12 18 12 14   Temp:      TempSrc:      SpO2: 100% 100% 99% 100%  Weight:      Height:        Intake/Output Summary (Last 24 hours) at 10/10/16 1619 Last data filed at 10/10/16 1434  Gross per 24 hour  Intake                0 ml  Output             2475 ml  Net            -2475 ml    PULM  Normal effort , no use of accessory muscles CV  No JVD, RRR Abd      No distended, nontender VASC  The right femoral catheter is clean dry and intact currently he is on dialysis with adequate flow rates. The left lower extremity is now tense and very edematous it has a dark erythema with multiple blisters. There is peau-de-orange of the skin. He is tender to palpation. Many of the blisters have ruptured. There is no odor  Laboratory CBC    Component Value Date/Time   WBC 24.3 (H) 10/08/2016 0721   HGB 9.7 (L) 10/08/2016 0721   HCT 29.4 (L) 10/08/2016 0721   HCT 33.1 (L) 05/12/2016 1126   PLT 40 (L) 10/08/2016 0721   PLT 69 (LL) 05/12/2016 1126    BMET    Component Value Date/Time   NA 138 10/10/2016 1455   NA 141 02/23/2016 1152   K 4.5 10/10/2016 1455   CL 109 10/10/2016 1455   CO2 21 (L) 10/10/2016 1455   GLUCOSE 116 (H) 10/10/2016 1455   BUN 93 (H) 10/10/2016 1455   BUN 24 02/23/2016 1152   CREATININE 4.81 (H) 10/10/2016 1455   CALCIUM 7.6 (L) 10/10/2016 1455   GFRNONAA 12 (L) 10/10/2016 1455   GFRAA 14 (L) 10/10/2016 1455    Assessment/Planning: 1.  Cellulitis left lower extremity:  Patient maintains palpable pulses and warm feet. I do  not believe there is arterial insufficiency. He has positive blood cultures from November 6 for a gram-negative Escherichia coli that is reported as sensitive to cefazolin. 2 days ago he was converted to oral Keflex. This appears to coincide with the resurgence of his cellulitis and I believe strong consideration for alternatives antibiotics as appropriate. Yesterday the patient did mention that he has MRSA however his admission swab was negative I do not know if this is a part of his past medical history but again consideration for adding vancomycin considering his significant change for the worse. I have personally placed multiple blankets underneath the foot of his bed as the beds that we now have are no longer capable of catching the feet up rather raised the knees and allow the ankle to be in a dependent position. Furthermore I will place orders for Silvadene cream to the blistered areas followed by a light gauze wrap and then an Ace wrap.  2.  Acute  on chronic renal insufficiency:  At the present time his catheter is functioning and he is receiving dialysis. It is uncertain at this point as to whether he will need a tunneled catheter  3.  GERD-Pepcid  4.  Hyponatremia-probably chronic from adverse effects of the psych medications Provided gentle hydration with IV fluids. Mentating fine. Repeat a.m. labs.   Hortencia Pilar  10/10/2016, 4:19 PM

## 2016-10-10 NOTE — Progress Notes (Signed)
Dialysis started 

## 2016-10-10 NOTE — Progress Notes (Signed)
Post dialysis 

## 2016-10-10 NOTE — Consult Note (Signed)
Pharmacy Antibiotic Note  Brandon Maynard. is a 60 y.o. male admitted on 10/04/2016 with bacteremia, cellulitis and UTI.  Pharmacy has been consulted for vancomycin and meropenem dosing. Patient was treated with one dose of vanc and zosyn on 11/6. meropenem from 11/7-11/9 and keflex from 11/1- present. Patient has ecoli bacteremia and UTI (28, 000 colonies). Pt has left lower extremity swelling/ cellulits, erythema with an open blister that is worsening. Pharmacy consulted to broaden abx. Patient has now been transitioned to HD due to worsening renal function. Pt is currently receiving his second HD session.  Plan: Vancomycin 2000mg  once for load. Since pt is currently in HD, spoke to the RN about giving load of vanc as soon as pt comes back. Then 1000mg  w/ HD therafter. Plan is to give Hd tomorrow as well. Will need to follow up on HD schedule if established.  Meropemen 500mg  daily- will give along with the vanc as soon as pt returns from HD. Vanc trough prior to the 3rd HD session-will need to be ordered.  Height: 6\' 3"  (190.5 cm) Weight: 189 lb 13.1 oz (86.1 kg) IBW/kg (Calculated) : 84.5  Temp (24hrs), Avg:99 F (37.2 C), Min:98.6 F (37 C), Max:100 F (37.8 C)   Recent Labs Lab 10/04/16 1551 10/04/16 1552 10/04/16 1852 10/05/16 0406 10/06/16 0430 10/07/16 0713 10/08/16 0721 10/09/16 1048 10/10/16 1455  WBC 12.7*  --   --  13.1* 19.4* 24.5* 24.3*  --   --   CREATININE 4.33*  --   --  4.53* 4.66* 5.11* 5.07* 5.47* 4.81*  LATICACIDVEN  --  2.6* 1.7  --   --   --   --   --   --     Estimated Creatinine Clearance: 19 mL/min (by C-G formula based on SCr of 4.81 mg/dL (H)).    Allergies  Allergen Reactions  . Tetracyclines & Related Hives    Antimicrobials this admission: vancomycin 11/6 >> 11/6, 11/12>> zosyn 11/6 >> 11/6 Meropenem 11/7>> 11/9, 11/12>> CTX 11/9 >> 11/10 Keflex 11/10>>11/12  Dose adjustments this admission:   Microbiology results: 11/6 BCx: ecoli 11/6  UCx: 30,000 GNR   11/6 MRSA PCR: neg  Thank you for allowing pharmacy to be a part of this patient's care.  Ramond Dial 10/10/2016 3:46 PM

## 2016-10-10 NOTE — Progress Notes (Signed)
Pre Dialysis 

## 2016-10-10 NOTE — Progress Notes (Signed)
[place] [facility] [place] NAME: Brandon Maynard    MR#:  [id number]  DATE OF BIRTH:  1956-03-04  SUBJECTIVE:  Came in with left leg swelling and erythema Patient asking for Coke. Confused and does not know what is going on around with him REVIEW OF SYSTEMS:   Review of Systems  Constitutional: Positive for fever. Negative for chills and weight loss.  HENT: Negative for ear discharge, ear pain and nosebleeds.   Eyes: Negative for blurred vision, pain and discharge.  Respiratory: Negative for sputum production, shortness of breath, wheezing and stridor.   Cardiovascular: Positive for leg swelling. Negative for chest pain, palpitations, orthopnea and PND.  Gastrointestinal: Negative for abdominal pain, diarrhea, nausea and vomiting.  Genitourinary: Negative for frequency and urgency.  Musculoskeletal: Positive for joint pain. Negative for back pain.  Neurological: Positive for weakness. Negative for sensory change, speech change and focal weakness.  Psychiatric/Behavioral: Negative for depression and hallucinations. The patient is not nervous/anxious.    Tolerating Diet:yes Tolerating PT: pending  DRUG ALLERGIES:   Allergies  Allergen Reactions  . Tetracyclines & Related Hives    VITALS:  Blood pressure 100/76, pulse 86, temperature 100 F (37.8 C), temperature source Oral, resp. rate 20, height 6\' 3"  (1.905 m), weight 86.2 kg (190 lb 0.6 oz), SpO2 100 %.  PHYSICAL EXAMINATION:   Physical Exam  GENERAL:  60 y.o.-year-old patient lying in the bed with no acute distress.  EYES: Pupils equal, round, reactive to light and accommodation. No scleral icterus. Extraocular muscles intact.  HEENT: Head atraumatic, normocephalic. Oropharynx and nasopharynx clear.  NECK:  Supple, no jugular venous distention. No thyroid enlargement, no tenderness.  LUNGS: Normal breath sounds bilaterally, no wheezing, rales, rhonchi. No use of accessory muscles of  respiration.  CARDIOVASCULAR: S1, S2 normal. No murmurs, rubs, or gallops.  ABDOMEN: Soft, nontender, nondistended. Bowel sounds present. No organomegaly or mass.  EXTREMITIES: bilateral chronic edema with left leg swelling and large blister+ with erythema and swelling appears worse. Pulse is dopplerable  NEUROLOGIC: Cranial nerves II through XII are intact. No focal Motor or sensory deficits b/l.   PSYCHIATRIC:  patient is alert but falls asleep instantly and intermittent confusion SKIN: Nas above  LABORATORY PANEL:  CBC  Recent Labs Lab 10/08/16 0721  WBC 24.3*  HGB 9.7*  HCT 29.4*  PLT 40*    Chemistries   Recent Labs Lab 10/05/16 0406  10/09/16 1048  NA 130*  < > 134*  K 4.2  < > 4.4  CL 101  < > 108  CO2 18*  < > 18*  GLUCOSE 90  < > 141*  BUN 96*  < > 109*  CREATININE 4.53*  < > 5.47*  CALCIUM 8.1*  < > 7.7*  AST 59*  --   --   ALT 22  --   --   ALKPHOS 43  --   --   BILITOT 0.6  --   --   < > = values in this interval not displayed. Cardiac Enzymes No results for input(s): TROPONINI in the last 168 hours. RADIOLOGY:  No results found. ASSESSMENT AND PLAN:   Brandon Maynard  is a 60 y.o. male with a known history of Bipolar disorder, cataract, chronic thrombocytopenia is presenting to the ED with a chief complaint of significant worsening of left lower extremity pain, redness and swelling which started with a small blister on the left foot. Denies any insect bites  #Sepsis  meets criteria with leukocytosis, elevated lactic acid and hypotension. Secondary to left leg cellulitis IV Zosyn and vancomycin---to IV meropenem since BC ecoli---po keflex (total 10 days) Pain management Keep legs elevated -Vascular surgery eval noted. Dr Ronalee Belts does not feel any need for intervention for PAD -Spoke with Dr. Delana Meyer today regarding worsening edema swelling and erythema. He will be evaluated by him later today. CT tibia-fibula shows inflammation and swelling. No loculated  collection of fluid.  #Acute Encephalopathy with  hypotension with elevated wbc and increasingconfusion with increasing BUN and creat Patient will need hemodialysis on emergent basis. 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 Pt has been evaluated by Oncology in the past and appears due to meds/sepsis and CKD -Depakote has been d/ced by Dr Weber Cooks  #DVT prophylaxis with SCDs  #Paranoid schizophrenia/ipolar do -seen by dr clapacs  Case discussed with Care Management/Social Worker. Management plans discussed with the patient, family and they are in agreement.  CODE STATUS:FULL DVT Prophylaxis: SCD TOTAL  criticalTIME TAKING CARE OF THIS PATIENT 40 minutes.  >50% time spent on counselling and coordination of care   Note: This dictation was prepared with Dragon dictation along with smaller phrase technology. Any transcriptional errors that result from this process are unintentional.  Hazell Siwik M.D on 10/10/2016 at 12:28 PM  Between 7am to 6pm - Pager - [phone]  After 6pm go to [email or web address] - password [email or web address] [facility]  [facility]  Office  [phone]  CC: Primary care physician; [name], MD

## 2016-10-10 NOTE — Progress Notes (Signed)
[facility]  ROUNDING NOTE   Subjective:  Patient remains a bit confused. He underwent his first hemodialysis treatment yesterday. We plan for an additional dialysis treatment today. Patient is making urine.   Objective:  Vital signs in last 24 hours:  Temp:  [98.6 F (37 C)-100 F (37.8 C)] 100 F (37.8 C) (11/12 0444) Pulse Rate:  [70-88] 86 (11/12 0444) Resp:  [12-20] 20 (11/12 0444) BP: (97-116)/(63-76) 100/76 (11/12 0444) SpO2:  [99 %-100 %] 100 % (11/12 0444) Weight:  [86.2 kg (190 lb 0.6 oz)] 86.2 kg (190 lb 0.6 oz) (11/11 1910)  Weight change:  Filed Weights   10/06/16 1200 10/09/16 1730 10/09/16 1910  Weight: 87.5 kg (192 lb 14.4 oz) 86.2 kg (190 lb 0.6 oz) 86.2 kg (190 lb 0.6 oz)    Intake/Output: I/O last 3 completed shifts: In: -  Out: 2125 [Urine:2125]   Intake/Output this shift:  Total I/O In: 0  Out: 1300 [Urine:1300]  Physical Exam: General: Laying in bed, Confused   Head: Normocephalic, atraumatic. Moist oral mucosal membranes  Eyes: Anicteric  Neck: Supple, trachea midline  Lungs:  Clear to auscultation, normal effort  Heart: S1S2 no rubs  Abdomen:  Soft, nontender, BS present  Extremities: 2+ LLE, area of cellulitis appears more erythematous today  Neurologic: Awake but confused at times, follows commands  Skin: Area of bullous on left foot increasing       Basic Metabolic Panel:  Recent Labs Lab 10/05/16 0406 10/05/16 1824 10/06/16 0430 10/07/16 0713 10/08/16 0721 10/09/16 1048 10/09/16 1708  NA 130*  --  134* 137 135 134*  --   K 4.2  --  4.0 3.7 4.0 4.4  --   CL 101  --  106 109 108 108  --   CO2 18*  --  18* 19* 20* 18*  --   GLUCOSE 90  --  116* 96 84 141*  --   BUN 96*  --  104* 107* 109* 109*  --   CREATININE 4.53*  --  4.66* 5.11* 5.07* 5.47*  --   CALCIUM 8.1*  --  7.9* 8.3* 8.1* 7.7*  --   PHOS  --  5.0*  --  3.7 3.9  --  5.0*    Liver Function Tests:  Recent Labs Lab 10/04/16 1551 10/05/16 0406  10/07/16 0713 10/08/16 0721  AST 53* 59*  --   --   ALT 22 22  --   --   ALKPHOS 70 43  --   --   BILITOT 0.8 0.6  --   --   PROT 6.8 5.0*  --   --   ALBUMIN 2.6* 1.8* 1.6* 1.5*   No results for input(s): LIPASE, AMYLASE in the last 168 hours.  Recent Labs Lab 10/06/16 0430  AMMONIA 29    CBC:  Recent Labs Lab 10/04/16 1551 10/05/16 0406 10/06/16 0430 10/07/16 0713 10/08/16 0721  WBC 12.7* 13.1* 19.4* 24.5* 24.3*  NEUTROABS 11.4*  --   --   --  20.1*  HGB 11.4* 9.0* 9.5* 10.1* 9.7*  HCT 34.0* 26.4* 28.2* 30.2* 29.4*  MCV 92.3 89.8 91.8 93.0 92.7  PLT 42* 18* 22* 33* 40*    Cardiac Enzymes: No results for input(s): CKTOTAL, CKMB, CKMBINDEX, TROPONINI in the last 168 hours.  BNP: Invalid input(s): POCBNP  CBG:  Recent Labs Lab 10/06/16 1153  GLUCAP 107*    Microbiology: Results for orders placed or performed during the hospital encounter of 10/04/16  Blood Culture (routine x 2)     Status: Abnormal   Collection Time: 10/04/16  3:51 PM  Result Value Ref Range Status   Specimen Description BLOOD RT AC  Final   Special Requests BOTTLES DRAWN AEROBIC AND ANAEROBIC 10CC  Final   Culture  Setup Time   Final    Organism ID to follow GRAM NEGATIVE RODS IN BOTH AEROBIC AND ANAEROBIC BOTTLES CRITICAL RESULT CALLED TO, READ BACK BY AND VERIFIED WITH: NATE COOKSON ON 10/05/16 AT 0448 BY TLB CONFIRMED BY TLB    Culture ESCHERICHIA COLI (A)  Final   Report Status 10/07/2016 FINAL  Final   Organism ID, Bacteria ESCHERICHIA COLI  Final      Susceptibility   Escherichia coli - MIC*    AMPICILLIN >=32 RESISTANT Resistant     CEFAZOLIN <=4 SENSITIVE Sensitive     CEFEPIME <=1 SENSITIVE Sensitive     CEFTAZIDIME <=1 SENSITIVE Sensitive     CEFTRIAXONE <=1 SENSITIVE Sensitive     CIPROFLOXACIN <=0.25 SENSITIVE Sensitive     GENTAMICIN <=1 SENSITIVE Sensitive     IMIPENEM <=0.25 SENSITIVE Sensitive     TRIMETH/SULFA >=320 RESISTANT Resistant      AMPICILLIN/SULBACTAM 16 INTERMEDIATE Intermediate     PIP/TAZO <=4 SENSITIVE Sensitive     Extended ESBL NEGATIVE Sensitive     * ESCHERICHIA COLI  Blood Culture ID Panel (Reflexed)     Status: Abnormal   Collection Time: 10/04/16  3:51 PM  Result Value Ref Range Status   Enterococcus species NOT DETECTED NOT DETECTED Final   Listeria monocytogenes NOT DETECTED NOT DETECTED Final   Staphylococcus species NOT DETECTED NOT DETECTED Final   Staphylococcus aureus NOT DETECTED NOT DETECTED Final   Streptococcus species NOT DETECTED NOT DETECTED Final   Streptococcus agalactiae NOT DETECTED NOT DETECTED Final   Streptococcus pneumoniae NOT DETECTED NOT DETECTED Final   Streptococcus pyogenes NOT DETECTED NOT DETECTED Final   Acinetobacter baumannii NOT DETECTED NOT DETECTED Final   Enterobacteriaceae species DETECTED (A) NOT DETECTED Final    Comment: CRITICAL RESULT CALLED TO, READ BACK BY AND VERIFIED WITH: NATE COOKSON ON 10/05/16 AT 0448 BY TLB    Enterobacter cloacae complex NOT DETECTED NOT DETECTED Final   Escherichia coli DETECTED (A) NOT DETECTED Final    Comment: CRITICAL RESULT CALLED TO, READ BACK BY AND VERIFIED WITH: NATE COOKSON ON 10/05/16 AT 0448 BY TLB                                                                                                Klebsiella oxytoca NOT DETECTED NOT DETECTED Final   Klebsiella pneumoniae NOT DETECTED NOT DETECTED Final   Proteus species NOT DETECTED NOT DETECTED Final   Serratia marcescens NOT DETECTED NOT DETECTED Final   Carbapenem resistance NOT DETECTED NOT DETECTED Final   Haemophilus influenzae NOT DETECTED NOT DETECTED Final   Neisseria meningitidis NOT DETECTED NOT DETECTED Final   Pseudomonas aeruginosa NOT DETECTED NOT DETECTED Final   Candida albicans NOT DETECTED NOT DETECTED Final   Candida glabrata NOT DETECTED NOT DETECTED Final   Candida krusei  NOT DETECTED NOT DETECTED Final   Candida parapsilosis NOT DETECTED NOT DETECTED  Final   Candida tropicalis NOT DETECTED NOT DETECTED Final  Blood Culture (routine x 2)     Status: Abnormal   Collection Time: 10/04/16  3:56 PM  Result Value Ref Range Status   Specimen Description BLOOD LT AC  Final   Special Requests BOTTLES DRAWN AEROBIC AND ANAEROBIC 10CC  Final   Culture  Setup Time   Final    GRAM NEGATIVE RODS IN BOTH AEROBIC AND ANAEROBIC BOTTLES CRITICAL RESULT CALLED TO, READ BACK BY AND VERIFIED WITH: NATE COOKSON ON 10/05/16 AT 0448 BY TLB CONFIRMED BY TLB    Culture ESCHERICHIA COLI (A)  Final   Report Status 10/07/2016 FINAL  Final  Urine culture     Status: Abnormal   Collection Time: 10/04/16  9:01 PM  Result Value Ref Range Status   Specimen Description URINE, RANDOM  Final   Special Requests NONE  Final   Culture 30,000 COLONIES/mL ESCHERICHIA COLI (A)  Final   Report Status 10/07/2016 FINAL  Final   Organism ID, Bacteria ESCHERICHIA COLI (A)  Final      Susceptibility   Escherichia coli - MIC*    AMPICILLIN >=32 RESISTANT Resistant     CEFAZOLIN <=4 SENSITIVE Sensitive     CEFTRIAXONE <=1 SENSITIVE Sensitive     CIPROFLOXACIN <=0.25 SENSITIVE Sensitive     GENTAMICIN <=1 SENSITIVE Sensitive     IMIPENEM <=0.25 SENSITIVE Sensitive     NITROFURANTOIN <=16 SENSITIVE Sensitive     TRIMETH/SULFA >=320 RESISTANT Resistant     AMPICILLIN/SULBACTAM 16 INTERMEDIATE Intermediate     PIP/TAZO <=4 SENSITIVE Sensitive     Extended ESBL NEGATIVE Sensitive     * 30,000 COLONIES/mL ESCHERICHIA COLI  MRSA PCR Screening     Status: None   Collection Time: 10/04/16  9:01 PM  Result Value Ref Range Status   MRSA by PCR NEGATIVE NEGATIVE Final    Comment:        The GeneXpert MRSA Assay (FDA approved for NASAL specimens only), is one component of a comprehensive MRSA colonization surveillance program. It is not intended to diagnose MRSA infection nor to guide or monitor treatment for MRSA infections.   MRSA PCR Screening     Status: None    Collection Time: 10/06/16 11:59 AM  Result Value Ref Range Status   MRSA by PCR NEGATIVE NEGATIVE Final    Comment:        The GeneXpert MRSA Assay (FDA approved for NASAL specimens only), is one component of a comprehensive MRSA colonization surveillance program. It is not intended to diagnose MRSA infection nor to guide or monitor treatment for MRSA infections.     Coagulation Studies: No results for input(s): LABPROT, INR in the last 72 hours.  Urinalysis: No results for input(s): COLORURINE, LABSPEC, PHURINE, GLUCOSEU, HGBUR, BILIRUBINUR, KETONESUR, PROTEINUR, UROBILINOGEN, NITRITE, LEUKOCYTESUR in the last 72 hours.  Invalid input(s): APPERANCEUR    Imaging: No results found.   Medications:    . asenapine  10 mg Sublingual QHS  . asenapine  5 mg Sublingual q morning - 10a  . brimonidine  1 drop Both Eyes Q12H   And  . timolol  1 drop Both Eyes Q12H  . cephALEXin  500 mg Oral Q12H  . docusate sodium  100 mg Oral BID  . FLUoxetine  40 mg Oral Daily  . latanoprost  1 drop Both Eyes QHS  . levothyroxine  50 mcg  Oral QAC breakfast  . multivitamin with minerals  1 tablet Oral Daily  . pyridOXINE  100 mg Oral BID  . simvastatin  20 mg Oral QHS  . traZODone  100 mg Oral QHS   sodium chloride, sodium chloride, acetaminophen, alteplase, haloperidol lactate, heparin, lidocaine (PF), lidocaine-prilocaine, ondansetron **OR** ondansetron (ZOFRAN) IV, oxyCODONE, pentafluoroprop-tetrafluoroeth  Assessment/ Plan:  60 y.o. male with a PMHx of anemia, bipolar disorder, degenerative disc disease, GERD, hyperlipidemia, hypertension, paranoid schizophrenia, who was admitted to [facility] on 10/04/2016 for evaluation of left leg swelling and redness.   1.  Acute renal failure/CKD stage III baseline Cr 2.1 egfr 32/proteinuria:  Patient admitted with left lower external a cellulitis as well as hypotension.  Suspect that acute renal failure is related to hypotension and subsequent ischemic  changes.  - BUN and creatinine have remained high despite patient having urine output. We plan for second hemodialysis treatment today. Recommend third dialysis treatment tomorrow, we will prepare orders for this.  2.  Hypotension.   blood pressure currently 100/76. Continue to monitor blood pressure closely as he still has recovering sepsis.  3.  Left lower extremity cellulitis.  Overall the cellulitis has regressed however the area of cellulitis present is more erythematous today. He also has a bullous lesion over his left foot that appears to be reaccumulating fluid. Continue to monitor her leg very closely.   LOS: 6 ,  11/12/20172:25 PM

## 2016-10-11 LAB — CBC WITH DIFFERENTIAL/PLATELET
BASOS PCT: 0 %
Basophils Absolute: 0 10*3/uL (ref 0–0.1)
EOS ABS: 0 10*3/uL (ref 0–0.7)
Eosinophils Relative: 0 %
HCT: 23.8 % — ABNORMAL LOW (ref 40.0–52.0)
HEMOGLOBIN: 8.1 g/dL — AB (ref 13.0–18.0)
LYMPHS ABS: 1.4 10*3/uL (ref 1.0–3.6)
Lymphocytes Relative: 11 %
MCH: 30.1 pg (ref 26.0–34.0)
MCHC: 33.9 g/dL (ref 32.0–36.0)
MCV: 88.8 fL (ref 80.0–100.0)
MONO ABS: 1.4 10*3/uL — AB (ref 0.2–1.0)
MONOS PCT: 11 %
NEUTROS PCT: 78 %
Neutro Abs: 10.1 10*3/uL — ABNORMAL HIGH (ref 1.4–6.5)
Platelets: 72 10*3/uL — ABNORMAL LOW (ref 150–440)
RBC: 2.68 MIL/uL — ABNORMAL LOW (ref 4.40–5.90)
RDW: 14.7 % — AB (ref 11.5–14.5)
WBC: 12.9 10*3/uL — ABNORMAL HIGH (ref 3.8–10.6)

## 2016-10-11 LAB — RENAL FUNCTION PANEL
ALBUMIN: 1.3 g/dL — AB (ref 3.5–5.0)
ANION GAP: 8 (ref 5–15)
BUN: 70 mg/dL — AB (ref 6–20)
CALCIUM: 7.7 mg/dL — AB (ref 8.9–10.3)
CO2: 24 mmol/L (ref 22–32)
Chloride: 111 mmol/L (ref 101–111)
Creatinine, Ser: 3.96 mg/dL — ABNORMAL HIGH (ref 0.61–1.24)
GFR calc Af Amer: 17 mL/min — ABNORMAL LOW (ref >60)
GFR calc non Af Amer: 15 mL/min — ABNORMAL LOW (ref >60)
GLUCOSE: 113 mg/dL — AB (ref 65–99)
POTASSIUM: 4.4 mmol/L (ref 3.5–5.1)
Phosphorus: 5.7 mg/dL — ABNORMAL HIGH (ref 2.5–4.6)
SODIUM: 143 mmol/L (ref 135–145)

## 2016-10-11 LAB — HEPATITIS B SURFACE ANTIBODY, QUANTITATIVE: Hepatitis B-Post: <3.1 m[IU]/mL — ABNORMAL LOW (ref >9.9)

## 2016-10-11 LAB — PARATHYROID HORMONE, INTACT (NO CA): PTH: 45 pg/mL (ref 15–65)

## 2016-10-11 LAB — HEPATITIS B SURFACE ANTIGEN: HEP B S AG: NEGATIVE

## 2016-10-11 MED ORDER — TUBERCULIN PPD 5 UNIT/0.1ML ID SOLN
5.0000 [IU] | Freq: Once | INTRADERMAL | Status: AC
  Administered 2016-10-11: 5 [IU] via INTRADERMAL
  Filled 2016-10-11: qty 0.1

## 2016-10-11 MED ORDER — CEFAZOLIN IN D5W 1 GM/50ML IV SOLN
1.0000 g | Freq: Every day | INTRAVENOUS | Status: DC
  Administered 2016-10-11 – 2016-10-20 (×10): 1 g via INTRAVENOUS
  Filled 2016-10-11 (×11): qty 50

## 2016-10-11 NOTE — Consult Note (Signed)
[facility] Consult   Reason for Consult:  Follow-up consult for 60 year old man with a history of chronic mental illness currently in the hospital with acute and complicated medical problems stemming from probably sepsis from a cellulitis and labile blood pressures. Updated problem as of today he is assessing capacity for decision-making Referring Physician:  Patel/Lateef Patient Identification: Brandon T Woodroof Jr. MRN:  [id number] Principal Diagnosis: Acute delirium Diagnosis:   Patient Active Problem List   Diagnosis Date Noted  . Acute delirium [R41.0] 10/05/2016  . Left leg cellulitis [P32.951] 10/04/2016  . B12 deficiency [E53.8] 07/06/2016  . Anemia [D64.9] 06/16/2016  . Thrombocytopenia ([place]) [D69.6] 06/16/2016  . Weight loss [R63.4] 06/16/2016  . Esophageal reflux [K21.9] 05/12/2016  . 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 Schizoaffective disorder ([place]) [F25.9] 06/13/2014    Total Time spent with patient: 20 minutes  Subjective:   Brandon Maynard. is a 60 y.o. male patient admitted with "I'm not feeling good".  HPI:  Follow-up Monday the 13th. Patient did agree to get a dialysis catheter placed and has now been dialyzed 3 times. He is clearly feeling better. He was awake and alert and able to engage in regular back and forth conversation. He is oriented to his situation better. He is a little bit labile but not agitated and not showing any dangerous or problem behavior right now. I note that his platelet count has gone up.  Past Psychiatric History: Chronic schizophrenia or schizoaffective disorder. Recently we have discontinued his Depakote out of concern for it worsening his thrombocytopenia and medical problems. He is still on his antipsychotic.  Risk to Self: Is patient at risk for suicide?: No Risk to Others:   Prior Inpatient Therapy:   Prior Outpatient Therapy:    Past Medical History:    Past Medical History:  Diagnosis Date  . Anemia   . Bipolar disorder ([place])   . Cancer ([place])   . Cataract    right eye  . DDD (degenerative disc disease)   . GERD (gastroesophageal reflux disease)   . Heart murmur   . Hyperlipidemia   . Hypertension   . Paranoid schizophrenia ([place])   . 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 Thyroid disease     Past Surgical History:  Procedure Laterality Date  . Dermal Abrasion  1973   Family History:  Family History  Problem Relation Age of Onset  . Brain cancer Maternal Uncle   . Breast cancer Mother   . Melanoma Mother   . Congestive Heart Failure Father   . Melanoma Father   . Breast cancer Maternal Aunt   . Diabetes Maternal Aunt    Family Psychiatric  History: No family history identified. Social History:  History  Alcohol Use No     History  Drug Use No    Social History   Social History  . Marital status: Single    Spouse name: N/A  . Number of children: N/A  . Years of education: N/A   Social History Main Topics  . Smoking status: Current Every Day Smoker    Last attempt to quit: 04/09/1991  . Smokeless tobacco: Never Used  . Alcohol use No  . Drug use: No  . Sexual activity: Not Asked   Other Topics Concern  . None   Social History Narrative  . None   Additional Social History:Patient's brother, who lives in [place], is the only relative actively involved in  making decisions. I am told by nephrology that the brother has been contacted and expresses agreement with proceeding with dialysis. Unfortunately he has not been made the healthcare power of attorney or guardian at this point.    Allergies:   Allergies  Allergen Reactions  . Tetracyclines & Related Hives    Labs:  Results for orders placed or performed during the hospital encounter of 10/04/16 (from the past 48 hour(s))  Renal function panel     Status: Abnormal   Collection Time: 10/10/16  2:55 PM  Result Value Ref Range   Sodium 138 135 - 145 mmol/L    Potassium 4.5 3.5 - 5.1 mmol/L   Chloride 109 101 - 111 mmol/L   CO2 21 (L) 22 - 32 mmol/L   Glucose, Bld 116 (H) 65 - 99 mg/dL   BUN 93 (H) 6 - 20 mg/dL   Creatinine, Ser 4.81 (H) 0.61 - 1.24 mg/dL   Calcium 7.6 (L) 8.9 - 10.3 mg/dL   Phosphorus 5.6 (H) 2.5 - 4.6 mg/dL   Albumin 1.4 (L) 3.5 - 5.0 g/dL   GFR calc non Af Amer 12 (L) >60 mL/min   GFR calc Af Amer 14 (L) >60 mL/min    Comment: (NOTE) The eGFR has been calculated using the CKD EPI equation. This calculation has not been validated in all clinical situations. eGFR's persistently <60 mL/min signify possible Chronic Kidney Disease.    Anion gap 8 5 - 15  CBC with Differential/Platelet     Status: Abnormal   Collection Time: 10/11/16  8:09 AM  Result Value Ref Range   WBC 12.9 (H) 3.8 - 10.6 K/uL   RBC 2.68 (L) 4.40 - 5.90 MIL/uL   Hemoglobin 8.1 (L) 13.0 - 18.0 g/dL   HCT 23.8 (L) 40.0 - 52.0 %   MCV 88.8 80.0 - 100.0 fL   MCH 30.1 26.0 - 34.0 pg   MCHC 33.9 32.0 - 36.0 g/dL   RDW 14.7 (H) 11.5 - 14.5 %   Platelets 72 (L) 150 - 440 K/uL   Neutrophils Relative % 78 %   Neutro Abs 10.1 (H) 1.4 - 6.5 K/uL   Lymphocytes Relative 11 %   Lymphs Abs 1.4 1.0 - 3.6 K/uL   Monocytes Relative 11 %   Monocytes Absolute 1.4 (H) 0.2 - 1.0 K/uL   Eosinophils Relative 0 %   Eosinophils Absolute 0.0 0 - 0.7 K/uL   Basophils Relative 0 %   Basophils Absolute 0.0 0 - 0.1 K/uL  Renal function panel     Status: Abnormal   Collection Time: 10/11/16 11:49 AM  Result Value Ref Range   Sodium 143 135 - 145 mmol/L   Potassium 4.4 3.5 - 5.1 mmol/L   Chloride 111 101 - 111 mmol/L   CO2 24 22 - 32 mmol/L   Glucose, Bld 113 (H) 65 - 99 mg/dL   BUN 70 (H) 6 - 20 mg/dL   Creatinine, Ser 3.96 (H) 0.61 - 1.24 mg/dL   Calcium 7.7 (L) 8.9 - 10.3 mg/dL   Phosphorus 5.7 (H) 2.5 - 4.6 mg/dL   Albumin 1.3 (L) 3.5 - 5.0 g/dL   GFR calc non Af Amer 15 (L) >60 mL/min   GFR calc Af Amer 17 (L) >60 mL/min    Comment: (NOTE) The eGFR has been  calculated using the CKD EPI equation. This calculation has not been validated in all clinical situations. eGFR's persistently <60 mL/min signify possible Chronic Kidney Disease.    Anion  gap 8 5 - 15    Current Facility-Administered Medications  Medication Dose Route Frequency Provider Last Rate Last Dose  . 0.9 %  sodium chloride infusion  100 mL Intravenous PRN Munsoor Lateef, MD      . 0.9 %  sodium chloride infusion  100 mL Intravenous PRN Munsoor Lateef, MD      . acetaminophen (TYLENOL) tablet 650 mg  650 mg Oral Q6H PRN Fritzi Mandes, MD   650 mg at 10/11/16 1123  . alteplase (CATHFLO ACTIVASE) injection 2 mg  2 mg Intracatheter Once PRN Munsoor Lateef, MD      . asenapine (SAPHRIS) sublingual tablet 10 mg  10 mg Sublingual QHS Fritzi Mandes, MD   10 mg at 10/10/16 2113  . asenapine (SAPHRIS) sublingual tablet 5 mg  5 mg Sublingual q morning - 10a Fritzi Mandes, MD   5 mg at 10/11/16 1124  . brimonidine (ALPHAGAN) 0.2 % ophthalmic solution 1 drop  1 drop Both Eyes Q12H Aruna Gouru, MD   1 drop at 10/11/16 1133   And  . timolol (TIMOPTIC) 0.5 % ophthalmic solution 1 drop  1 drop Both Eyes Q12H Nicholes Mango, MD   1 drop at 10/11/16 1133  . ceFAZolin (ANCEF) IVPB 1 g/50 mL premix  1 g Intravenous q1800 Napoleon Form, RPH      . docusate sodium (COLACE) capsule 100 mg  100 mg Oral BID Nicholes Mango, MD   100 mg at 10/10/16 2113  . FLUoxetine (PROZAC) capsule 40 mg  40 mg Oral Daily Nicholes Mango, MD   40 mg at 10/11/16 1123  . haloperidol lactate (HALDOL) injection 2 mg  2 mg Intravenous Q6H PRN Gonzella Lex, MD   2 mg at 10/10/16 0316  . heparin injection 1,000 Units  1,000 Units Dialysis PRN Munsoor Lateef, MD      . latanoprost (XALATAN) 0.005 % ophthalmic solution 1 drop  1 drop Both Eyes QHS Nicholes Mango, MD   1 drop at 10/10/16 2114  . levothyroxine (SYNTHROID, LEVOTHROID) tablet 50 mcg  50 mcg Oral QAC breakfast Nicholes Mango, MD   50 mcg at 10/11/16 1124  . lidocaine (PF) (XYLOCAINE) 1 %  injection 5 mL  5 mL Intradermal PRN Munsoor Lateef, MD      . lidocaine-prilocaine (EMLA) cream 1 application  1 application Topical PRN Munsoor Lateef, MD      . multivitamin with minerals tablet 1 tablet  1 tablet Oral Daily Nicholes Mango, MD   1 tablet at 10/11/16 1123  . ondansetron (ZOFRAN) tablet 4 mg  4 mg Oral Q6H PRN Nicholes Mango, MD       Or  . ondansetron (ZOFRAN) injection 4 mg  4 mg Intravenous Q6H PRN Nicholes Mango, MD      . oxyCODONE (Oxy IR/ROXICODONE) immediate release tablet 5 mg  5 mg Oral Q6H PRN Fritzi Mandes, MD   5 mg at 10/10/16 2244  . pentafluoroprop-tetrafluoroeth (GEBAUERS) aerosol 1 application  1 application Topical PRN Munsoor Lateef, MD      . pyridOXINE (VITAMIN B-6) tablet 100 mg  100 mg Oral BID Nicholes Mango, MD   100 mg at 10/11/16 1132  . silver sulfADIAZINE (SILVADENE) 1 % cream   Topical BID Fritzi Mandes, MD      . simvastatin (ZOCOR) tablet 20 mg  20 mg Oral QHS Nicholes Mango, MD   20 mg at 10/10/16 2113  . traZODone (DESYREL) tablet 100 mg  100 mg Oral QHS Aruna Gouru,  MD   100 mg at 10/10/16 2113  . tuberculin injection 5 Units  5 Units Intradermal Once Lavonia Dana, MD       Facility-Administered Medications Ordered in Other Encounters  Medication Dose Route Frequency Provider Last Rate Last Dose  . cyanocobalamin ((VITAMIN B-12)) injection 1,000 mcg  1,000 mcg Intramuscular Once Lequita Asal, MD        Musculoskeletal: Strength & Muscle Tone: decreased Gait & Station: unable to stand Patient leans: Backward  Psychiatric Specialty Exam: Physical Exam  Nursing note and vitals reviewed. Constitutional: He appears well-developed and well-nourished.  HENT:  Head: Normocephalic and atraumatic.  Eyes: Conjunctivae are normal. Pupils are equal, round, and reactive to light.  Neck: Normal range of motion.  Cardiovascular: Normal heart sounds.   Respiratory: No respiratory distress.  GI: Soft.  Musculoskeletal: Normal range of motion.  Neurological:  He is alert.  Skin: Skin is warm and dry.     Psychiatric: His affect is not labile. His speech is delayed and tangential. His speech is not slurred. He is slowed. He is not agitated. He does not express impulsivity. He expresses no suicidal ideation. He expresses no suicidal plans. He exhibits abnormal recent memory.    Review of Systems  Constitutional: Negative.   HENT: Negative.   Eyes: Negative.   Respiratory: Negative.   Cardiovascular: Negative.   Gastrointestinal: Negative.   Musculoskeletal: Positive for joint pain and myalgias.  Skin: Negative.   Neurological: Negative.   Psychiatric/Behavioral: Negative for depression, hallucinations, memory loss, substance abuse and suicidal ideas. The patient is not nervous/anxious and does not have insomnia.     Blood pressure 110/69, pulse 77, temperature 98 F (36.7 C), temperature source Oral, resp. rate 19, height 6' 3"  (1.905 m), weight 86.1 kg (189 lb 13.1 oz), SpO2 99 %.Body mass index is 23.73 kg/m.  General Appearance: Disheveled  Eye Contact:  None  Speech:  Garbled  Volume:  Decreased  Mood:  Irritable  Affect:  Constricted  Thought Process:  Goal Directed  Orientation:  Full (Time, Place, and Person)  Thought Content:  Patient has some disorganization of his thought but he is able to be redirected and he actually was able to discuss his current condition without reference to anything that appeared to be obviously delusional.  Suicidal Thoughts:  No  Homicidal Thoughts:  No  Memory:  Immediate;   Fair Recent;   Fair Remote;   Fair  Judgement:  Impaired  Insight:  Shallow  Psychomotor Activity:  Decreased  Concentration:  Concentration: Poor  Recall:  [employer] of Knowledge:  Fair  Language:  Fair  Akathisia:  No  Handed:  Right  AIMS (if indicated):     Assets:  Communication Skills Desire for Improvement Social Support  ADL's:  Impaired  Cognition:  Impaired,  Mild  Sleep:        Treatment Plan  Summary: Daily contact with patient to assess and evaluate symptoms and progress in treatment, Medication management and Plan As far as his agitation I have put in orders for haloperidol 2 mg intravenous every 6 hours as needed for agitation related to delirium. This should help to control his behavior safely in the intensive care unit. We are continuing off the Depakote for now but he has been continued on his other psychiatric medicine and according to nursing has been compliant with them area   Patient is much better today. The dialysis has obviously helped as well as getting the infection  treated. He seems to be doing okay off of the Depakote. He is a little more animated today but doesn't obviously seem to be manic. I think that it would be best to continue him on his current medicine with the Saphris and Prozac and stay off the Depakote for now. I will continue to follow up as needed. Disposition: Supportive therapy provided about ongoing stressors.  Alethia Berthold, MD 10/11/2016 5:12 PM

## 2016-10-11 NOTE — Progress Notes (Signed)
[facility]  ROUNDING NOTE   Subjective:   Laying in bed. Wants a phone, however seems to be abusing this privilege.   UOP [id number]  Dialysis scheduled for later today.   Objective:  Vital signs in last 24 hours:  Temp:  [98.3 F (36.8 C)-99.3 F (37.4 C)] 98.3 F (36.8 C) (11/13 0428) Pulse Rate:  [70-80] 73 (11/13 0428) Resp:  [12-24] 20 (11/13 0428) BP: (98-133)/(62-77) 113/64 (11/13 0428) SpO2:  [96 %-100 %] 100 % (11/13 0428) Weight:  [86.1 kg (189 lb 13.1 oz)] 86.1 kg (189 lb 13.1 oz) (11/12 1654)  Weight change: -0.1 kg (-3.5 oz) Filed Weights   10/09/16 1910 10/10/16 1450 10/10/16 1654  Weight: 86.2 kg (190 lb 0.6 oz) 86.1 kg (189 lb 13.1 oz) 86.1 kg (189 lb 13.1 oz)    Intake/Output: I/O last 3 completed shifts: In: 1362.3 [P.O.:720; IV Piggyback:642.3] Out: 9381 [Urine:4925]   Intake/Output this shift:  Total I/O In: 360 [P.O.:360] Out: -   Physical Exam: General: Laying in bed  Head: Normocephalic, atraumatic. Moist oral mucosal membranes  Eyes: Anicteric  Neck: Supple, trachea midline  Lungs:  Clear to auscultation, normal effort  Heart: S1S2 no rubs  Abdomen:  Soft, nontender, BS present  Extremities: 1+ edema left lower extremity, +erythema and clean dressings.   Neurologic: Awake, follows commands  Skin: Area of bullous on left foot increasing  Access:  Right femoral temp HD catheter, 11/11 Dr. Delana Meyer    Basic Metabolic Panel:  Recent Labs Lab 10/05/16 1824 10/06/16 0430 10/07/16 0713 10/08/16 0721 10/09/16 1048 10/09/16 1708 10/10/16 1455  NA  --  134* 137 135 134*  --  138  K  --  4.0 3.7 4.0 4.4  --  4.5  CL  --  106 109 108 108  --  109  CO2  --  18* 19* 20* 18*  --  21*  GLUCOSE  --  116* 96 84 141*  --  116*  BUN  --  104* 107* 109* 109*  --  93*  CREATININE  --  4.66* 5.11* 5.07* 5.47*  --  4.81*  CALCIUM  --  7.9* 8.3* 8.1* 7.7*  --  7.6*  PHOS 5.0*  --  3.7 3.9  --  5.0* 5.6*    Liver Function  Tests:  Recent Labs Lab 10/04/16 1551 10/05/16 0406 10/07/16 0713 10/08/16 0721 10/10/16 1455  AST 53* 59*  --   --   --   ALT 22 22  --   --   --   ALKPHOS 70 43  --   --   --   BILITOT 0.8 0.6  --   --   --   PROT 6.8 5.0*  --   --   --   ALBUMIN 2.6* 1.8* 1.6* 1.5* 1.4*   No results for input(s): LIPASE, AMYLASE in the last 168 hours.  Recent Labs Lab 10/06/16 0430  AMMONIA 29    CBC:  Recent Labs Lab 10/04/16 1551 10/05/16 0406 10/06/16 0430 10/07/16 0713 10/08/16 0721 10/11/16 0809  WBC 12.7* 13.1* 19.4* 24.5* 24.3* 12.9*  NEUTROABS 11.4*  --   --   --  20.1* 10.1*  HGB 11.4* 9.0* 9.5* 10.1* 9.7* 8.1*  HCT 34.0* 26.4* 28.2* 30.2* 29.4* 23.8*  MCV 92.3 89.8 91.8 93.0 92.7 88.8  PLT 42* 18* 22* 33* 40* 72*    Cardiac Enzymes: No results for input(s): CKTOTAL, CKMB, CKMBINDEX, TROPONINI in the last 168 hours.  BNP: Invalid input(s): POCBNP  CBG:  Recent Labs Lab 10/06/16 1153  GLUCAP 107*    Microbiology: Results for orders placed or performed during the hospital encounter of 10/04/16  Blood Culture (routine x 2)     Status: Abnormal   Collection Time: 10/04/16  3:51 PM  Result Value Ref Range Status   Specimen Description BLOOD RT [facility]  Final   Special Requests BOTTLES DRAWN AEROBIC AND ANAEROBIC 10CC  Final   Culture  Setup Time   Final    Organism ID to follow GRAM NEGATIVE RODS IN BOTH AEROBIC AND ANAEROBIC BOTTLES CRITICAL RESULT CALLED TO, READ BACK BY AND VERIFIED WITH: NATE COOKSON ON 10/05/16 AT 0448 BY TLB CONFIRMED BY TLB    Culture ESCHERICHIA COLI (A)  Final   Report Status 10/07/2016 FINAL  Final   Organism ID, Bacteria ESCHERICHIA COLI  Final      Susceptibility   Escherichia coli - MIC*    AMPICILLIN >=32 RESISTANT Resistant     CEFAZOLIN <=4 SENSITIVE Sensitive     CEFEPIME <=1 SENSITIVE Sensitive     CEFTAZIDIME <=1 SENSITIVE Sensitive     CEFTRIAXONE <=1 SENSITIVE Sensitive     CIPROFLOXACIN <=0.25 SENSITIVE Sensitive      GENTAMICIN <=1 SENSITIVE Sensitive     IMIPENEM <=0.25 SENSITIVE Sensitive     TRIMETH/SULFA >=320 RESISTANT Resistant     AMPICILLIN/SULBACTAM 16 INTERMEDIATE Intermediate     PIP/TAZO <=4 SENSITIVE Sensitive     Extended ESBL NEGATIVE Sensitive     * ESCHERICHIA COLI  Blood Culture ID Panel (Reflexed)     Status: Abnormal   Collection Time: 10/04/16  3:51 PM  Result Value Ref Range Status   Enterococcus species NOT DETECTED NOT DETECTED Final   Listeria monocytogenes NOT DETECTED NOT DETECTED Final   Staphylococcus species NOT DETECTED NOT DETECTED Final   Staphylococcus aureus NOT DETECTED NOT DETECTED Final   Streptococcus species NOT DETECTED NOT DETECTED Final   Streptococcus agalactiae NOT DETECTED NOT DETECTED Final   Streptococcus pneumoniae NOT DETECTED NOT DETECTED Final   Streptococcus pyogenes NOT DETECTED NOT DETECTED Final   Acinetobacter baumannii NOT DETECTED NOT DETECTED Final   Enterobacteriaceae species DETECTED (A) NOT DETECTED Final    Comment: CRITICAL RESULT CALLED TO, READ BACK BY AND VERIFIED WITH: NATE COOKSON ON 10/05/16 AT 0448 BY TLB    Enterobacter cloacae complex NOT DETECTED NOT DETECTED Final   Escherichia coli DETECTED (A) NOT DETECTED Final    Comment: CRITICAL RESULT CALLED TO, READ BACK BY AND VERIFIED WITH: NATE COOKSON ON 10/05/16 AT 0448 BY TLB                                                                                                Klebsiella oxytoca NOT DETECTED NOT DETECTED Final   Klebsiella pneumoniae NOT DETECTED NOT DETECTED Final   Proteus species NOT DETECTED NOT DETECTED Final   Serratia marcescens NOT DETECTED NOT DETECTED Final   Carbapenem resistance NOT DETECTED NOT DETECTED Final   Haemophilus influenzae NOT DETECTED NOT DETECTED Final   Neisseria meningitidis NOT DETECTED NOT DETECTED  Final   Pseudomonas aeruginosa NOT DETECTED NOT DETECTED Final   Candida albicans NOT DETECTED NOT DETECTED Final   Candida glabrata  NOT DETECTED NOT DETECTED Final   Candida krusei NOT DETECTED NOT DETECTED Final   Candida parapsilosis NOT DETECTED NOT DETECTED Final   Candida tropicalis NOT DETECTED NOT DETECTED Final  Blood Culture (routine x 2)     Status: Abnormal   Collection Time: 10/04/16  3:56 PM  Result Value Ref Range Status   Specimen Description BLOOD LT AC  Final   Special Requests BOTTLES DRAWN AEROBIC AND ANAEROBIC 10CC  Final   Culture  Setup Time   Final    GRAM NEGATIVE RODS IN BOTH AEROBIC AND ANAEROBIC BOTTLES CRITICAL RESULT CALLED TO, READ BACK BY AND VERIFIED WITH: NATE COOKSON ON 10/05/16 AT 0448 BY TLB CONFIRMED BY TLB    Culture ESCHERICHIA COLI (A)  Final   Report Status 10/07/2016 FINAL  Final  Urine culture     Status: Abnormal   Collection Time: 10/04/16  9:01 PM  Result Value Ref Range Status   Specimen Description URINE, RANDOM  Final   Special Requests NONE  Final   Culture 30,000 COLONIES/mL ESCHERICHIA COLI (A)  Final   Report Status 10/07/2016 FINAL  Final   Organism ID, Bacteria ESCHERICHIA COLI (A)  Final      Susceptibility   Escherichia coli - MIC*    AMPICILLIN >=32 RESISTANT Resistant     CEFAZOLIN <=4 SENSITIVE Sensitive     CEFTRIAXONE <=1 SENSITIVE Sensitive     CIPROFLOXACIN <=0.25 SENSITIVE Sensitive     GENTAMICIN <=1 SENSITIVE Sensitive     IMIPENEM <=0.25 SENSITIVE Sensitive     NITROFURANTOIN <=16 SENSITIVE Sensitive     TRIMETH/SULFA >=320 RESISTANT Resistant     AMPICILLIN/SULBACTAM 16 INTERMEDIATE Intermediate     PIP/TAZO <=4 SENSITIVE Sensitive     Extended ESBL NEGATIVE Sensitive     * 30,000 COLONIES/mL ESCHERICHIA COLI  MRSA PCR Screening     Status: None   Collection Time: 10/04/16  9:01 PM  Result Value Ref Range Status   MRSA by PCR NEGATIVE NEGATIVE Final    Comment:        The GeneXpert MRSA Assay (FDA approved for NASAL specimens only), is one component of a comprehensive MRSA colonization surveillance program. It is not intended to  diagnose MRSA infection nor to guide or monitor treatment for MRSA infections.   MRSA PCR Screening     Status: None   Collection Time: 10/06/16 11:59 AM  Result Value Ref Range Status   MRSA by PCR NEGATIVE NEGATIVE Final    Comment:        The GeneXpert MRSA Assay (FDA approved for NASAL specimens only), is one component of a comprehensive MRSA colonization surveillance program. It is not intended to diagnose MRSA infection nor to guide or monitor treatment for MRSA infections.     Coagulation Studies: No results for input(s): LABPROT, INR in the last 72 hours.  Urinalysis: No results for input(s): COLORURINE, LABSPEC, PHURINE, GLUCOSEU, HGBUR, BILIRUBINUR, KETONESUR, PROTEINUR, UROBILINOGEN, NITRITE, LEUKOCYTESUR in the last 72 hours.  Invalid input(s): APPERANCEUR    Imaging: No results found.   Medications:    . asenapine  10 mg Sublingual QHS  . asenapine  5 mg Sublingual q morning - 10a  . brimonidine  1 drop Both Eyes Q12H   And  . timolol  1 drop Both Eyes Q12H  . docusate sodium  100 mg Oral  BID  . FLUoxetine  40 mg Oral Daily  . latanoprost  1 drop Both Eyes QHS  . levothyroxine  50 mcg Oral QAC breakfast  . meropenem (MERREM) IV  500 mg Intravenous Q24H  . multivitamin with minerals  1 tablet Oral Daily  . pyridOXINE  100 mg Oral BID  . silver sulfADIAZINE   Topical BID  . simvastatin  20 mg Oral QHS  . traZODone  100 mg Oral QHS  . tuberculin  5 Units Intradermal Once   sodium chloride, sodium chloride, acetaminophen, alteplase, haloperidol lactate, heparin, lidocaine (PF), lidocaine-prilocaine, ondansetron **OR** ondansetron (ZOFRAN) IV, oxyCODONE, pentafluoroprop-tetrafluoroeth, vancomycin  Assessment/ Plan:  60 y.o. white male with past medication history of anemia, bipolar disorder, degenerative disc disease, GERD, hyperlipidemia, hypertension, paranoid schizophrenia, who was admitted to [facility] on 10/04/2016 for evaluation of left leg swelling  and redness.   1.  Acute renal failure on chronic kidney disease stage III: baseline creatinine of 2.1 EGFR 32 with proteinuria Acute renal failure secondary to sepsis, hypotension and ATN Requiring renal replacement. Two dialysis treatments 11/11 and 11/12.  Nonoliguric urine output.  Chronic kidney disease secondary to hypertension  - Check renal function panel.  - Hold dialysis for today. Monitor for dialysis need daily.  - Check hepatitis panel and PPD  2.  Hypotension: blood pressure has improved. Secondary to sepsis.  Take labetalol at home.   3.  Left lower extremity cellulitis: with sepsis. Leukocytosis improving. Afebrile - meropenem.   4. Anemia and thrombocytopenia: hemoglobin dropped to 8.1. Platelets improved off valproic acid Hematology consulted on 11/9   LOS: [place], [place] 11/13/201711:41 AM

## 2016-10-11 NOTE — Progress Notes (Signed)
   10/10/16 1455 10/10/16 1515 10/10/16 1530  During Hemodialysis Assessment  Blood Flow Rate (mL/min) 250 mL/min 250 mL/min 250 mL/min  Arterial Pressure (mmHg) -90 mmHg -90 mmHg -100 mmHg  Venous Pressure (mmHg) 90 mmHg 100 mmHg 100 mmHg  Transmembrane Pressure (mmHg) 40 mmHg 50 mmHg 50 mmHg  Ultrafiltration Rate (mL/min) 250 mL/min 250 mL/min 250 mL/min  Dialysate Flow Rate (mL/min) 500 ml/min 500 ml/min 500 ml/min  Conductivity: Machine  14.2 14.9 14.7  HD Safety Checks Performed Yes Yes Yes  Dialysis Fluid Bolus Normal Saline --  --   Bolus Amount (mL) 250 mL --  --   Dialysate Change Other (comment) (3K) (3K) --   Intra-Hemodialysis Comments Tx initiated Resting 148. Resting     10/10/16 1600 10/10/16 1630 10/10/16 1650  During Hemodialysis Assessment  Blood Flow Rate (mL/min) 250 mL/min 250 mL/min --   Arterial Pressure (mmHg) -100 mmHg -100 mmHg --   Venous Pressure (mmHg) 100 mmHg 100 mmHg --   Transmembrane Pressure (mmHg) 50 mmHg 40 mmHg --   Ultrafiltration Rate (mL/min) 250 mL/min 250 mL/min --   Dialysate Flow Rate (mL/min) 500 ml/min 500 ml/min --   Conductivity: Machine  14.1 14.1 --   HD Safety Checks Performed Yes Yes --   Dialysis Fluid Bolus --  --  --   Bolus Amount (mL) --  --  --   Dialysate Change --  --  --   Intra-Hemodialysis Comments 268. Resting 393. Resting 500. Tx complete

## 2016-10-11 NOTE — Consult Note (Signed)
Pharmacy Antibiotic Note  Brandon Samarripa. is a 60 y.o. male admitted on 10/04/2016 with bacteremia, cellulitis and UTI.  Pharmacy has been consulted for vancomycin and meropenem dosing. Patient was treated with one dose of vanc and zosyn on 11/6. meropenem from 11/7-11/9 and keflex from 11/1- present. Patient has ecoli bacteremia and UTI (54, 000 colonies). Pt has left lower extremity swelling/ cellulits, erythema with an open blister that is worsening. Pharmacy consulted to broaden abx. Patient has now been transitioned to HD due to worsening renal function - first HD session 11/11; second HD session 11/12, HD on hold for 11/13.   ID narrowing abx to cefazolin.   Plan: Cefazolin 1 g iv q 24 hours.     Height: 6\' 3"  (190.5 cm) Weight: 189 lb 13.1 oz (86.1 kg) IBW/kg (Calculated) : 84.5  Temp (24hrs), Avg:98.7 F (37.1 C), Min:98 F (36.7 C), Max:99.3 F (37.4 C)   Recent Labs Lab 10/04/16 1852  10/05/16 0406 10/06/16 0430 10/07/16 0713 10/08/16 0721 10/09/16 1048 10/10/16 1455 10/11/16 0809 10/11/16 1149  WBC  --   --  13.1* 19.4* 24.5* 24.3*  --   --  12.9*  --   CREATININE  --   < > 4.53* 4.66* 5.11* 5.07* 5.47* 4.81*  --  3.96*  LATICACIDVEN 1.7  --   --   --   --   --   --   --   --   --   < > = values in this interval not displayed.  Estimated Creatinine Clearance: 23.1 mL/min (by C-G formula based on SCr of 3.96 mg/dL (H)).    Allergies  Allergen Reactions  . Tetracyclines & Related Hives    Antimicrobials this admission: vancomycin 11/6 >> 11/6, 11/12>> 11/13 zosyn 11/6 >> 11/6 Meropenem 11/7>> 11/9, 11/12>>11/13 CTX 11/9 >> 11/10 Keflex 11/10>>11/12 Cefazolin 11/13 >>  Dose adjustments this admission:   Microbiology results: 11/13 WCx: pending 11/6 BCx: ecoli 11/6 UCx: 30,000 EColi 11/6 MRSA PCR: neg  Thank you for allowing pharmacy to be a part of this patient's care.  Ulice Dash D 10/11/2016 4:05 PM

## 2016-10-11 NOTE — Progress Notes (Signed)
   10/09/16 1736 10/09/16 1800 10/09/16 1830  During Hemodialysis Assessment  Blood Flow Rate (mL/min) 150 mL/min 150 mL/min 150 mL/min  Arterial Pressure (mmHg) -30 mmHg -50 mmHg -40 mmHg  Venous Pressure (mmHg) 30 mmHg 60 mmHg 60 mmHg  Transmembrane Pressure (mmHg) 20 mmHg 30 mmHg 30 mmHg  Ultrafiltration Rate (mL/min) 330 mL/min 330 mL/min 330 mL/min  Dialysate Flow Rate (mL/min) 300 ml/min 300 ml/min 300 ml/min  Conductivity: Machine  14.1 13.5 14.1  HD Safety Checks Performed Yes Yes Yes  Dialysis Fluid Bolus Normal Saline --  --   Bolus Amount (mL) 250 mL --  --   Dialysate Change Other (comment) (3K) --  --   Intra-Hemodialysis Comments Tx initiated Resting Resting     10/09/16 1856 10/09/16 1906  During Hemodialysis Assessment  Blood Flow Rate (mL/min) 150 mL/min --   Arterial Pressure (mmHg) -60 mmHg --   Venous Pressure (mmHg) 60 mmHg --   Transmembrane Pressure (mmHg) 30 mmHg --   Ultrafiltration Rate (mL/min) 330 mL/min --   Dialysate Flow Rate (mL/min) 300 ml/min --   Conductivity: Machine  14.3 --   HD Safety Checks Performed Yes --   Dialysis Fluid Bolus --  --   Bolus Amount (mL) --  --   Dialysate Change --  --   Intra-Hemodialysis Comments Resting 500. Tx complete

## 2016-10-11 NOTE — Progress Notes (Signed)
[place] [facility] [place] NAME: Brandon Maynard    MR#:  [id number]  DATE OF BIRTH:  [birth year]  SUBJECTIVE:  Came in with left leg swelling and erythema Now on HD Urine output 4500 ml last 24 hrs Still some confusion REVIEW OF SYSTEMS:   Review of Systems  Constitutional: Positive for fever. Negative for chills and weight loss.  HENT: Negative for ear discharge, ear pain and nosebleeds.   Eyes: Negative for blurred vision, pain and discharge.  Respiratory: Negative for sputum production, shortness of breath, wheezing and stridor.   Cardiovascular: Positive for leg swelling. Negative for chest pain, palpitations, orthopnea and PND.  Gastrointestinal: Negative for abdominal pain, diarrhea, nausea and vomiting.  Genitourinary: Negative for frequency and urgency.  Musculoskeletal: Positive for joint pain. Negative for back pain.  Neurological: Positive for weakness. Negative for sensory change, speech change and focal weakness.  Psychiatric/Behavioral: Negative for depression and hallucinations. The patient is not nervous/anxious.    Tolerating Diet:yes  DRUG ALLERGIES:   Allergies  Allergen Reactions  . Tetracyclines & Related Hives    VITALS:  Blood pressure 113/64, pulse 73, temperature 98.3 F (36.8 C), temperature source Oral, resp. rate 20, height 6\' 3"  (1.905 m), weight 86.1 kg (189 lb 13.1 oz), SpO2 100 %.  PHYSICAL EXAMINATION:   Physical Exam  GENERAL:  60 y.o.-year-old patient lying in the bed with no acute distress.  EYES: Pupils equal, round, reactive to light and accommodation. No scleral icterus. Extraocular muscles intact.  HEENT: Head atraumatic, normocephalic. Oropharynx and nasopharynx clear.  NECK:  Supple, no jugular venous distention. No thyroid enlargement, no tenderness.  LUNGS: Normal breath sounds bilaterally, no wheezing, rales, rhonchi. No use of accessory muscles of respiration.  CARDIOVASCULAR: S1, S2  normal. No murmurs, rubs, or gallops.  ABDOMEN: Soft, nontender, nondistended. Bowel sounds present. No organomegaly or mass.  EXTREMITIES: bilateral chronic edema with left leg swelling and large blister+ with erythema and swelling appears worse. Pulse is dopplerable  NEUROLOGIC: Cranial nerves II through XII are intact. No focal Motor or sensory deficits b/l.   PSYCHIATRIC:  patient is alert but falls asleep instantly and intermittent confusion SKIN: as above  LABORATORY PANEL:  CBC  Recent Labs Lab 10/11/16 0809  WBC 12.9*  HGB 8.1*  HCT 23.8*  PLT 72*    Chemistries   Recent Labs Lab 10/05/16 0406  10/10/16 1455  NA 130*  < > 138  K 4.2  < > 4.5  CL 101  < > 109  CO2 18*  < > 21*  GLUCOSE 90  < > 116*  BUN 96*  < > 93*  CREATININE 4.53*  < > 4.81*  CALCIUM 8.1*  < > 7.6*  AST 59*  --   --   ALT 22  --   --   ALKPHOS 43  --   --   BILITOT 0.6  --   --   < > = values in this interval not displayed. Cardiac Enzymes No results for input(s): TROPONINI in the last 168 hours. RADIOLOGY:  No results found. ASSESSMENT AND PLAN:   Chato Laatsch  is a 60 y.o. male with a known history of Bipolar disorder, cataract, chronic thrombocytopenia is presenting to the ED with a chief complaint of significant worsening of left lower extremity pain, redness and swelling which started with a small blister on the left foot. Denies any insect bites  #Sepsis meets criteria with leukocytosis, elevated lactic  acid and hypotension. Secondary to left leg cellulitis IV Zosyn and vancomycin narrowed to keflex but now back on IV abx due to worsening Pain management Keep legs elevated -Vascular surgery eval noted. Dr Ronalee Belts does not feel any need for intervention for PAD. CT tibia-fibula shows inflammation and swelling. No loculated collection of fluid.  #Acute Encephalopathy  Improving  # acute on CKD-IV -baseline -2.3---4.53--4.66--5.11--5.02--5.47 -Patient now started on  hemodialysis  #Hyponatremia Resolved  #Thrombocytopenia- improving. Platelets today 72,000 -plt 42K---18K--22k -multifactorial. Pt has been evaluated by Oncology in the past and appears due to meds/sepsis and CKD -Depakote has been discontinued by Dr clapacs  # DVT prophylaxis with SCDs  # Paranoid schizophrenia/ Bipolar disorder - Seen by Dr Weber Cooks  Case discussed with Care Management/Social Worker. Management plans discussed with the patient, family and they are in agreement.  CODE STATUS:FULL  DVT Prophylaxis: SCD  TOTAL TIME TAKING CARE OF THIS PATIENT 35 minutes.   Note: This dictation was prepared with Dragon dictation along with smaller phrase technology. Any transcriptional errors that result from this process are unintentional.  Hillary Bow R M.D on 10/11/2016 at 11:13 AM  Between 7am to 6pm - Pager - [phone]  After 6pm go to [email or web address] - password [email or web address] [facility]  [facility]  Office  [phone]  CC: Primary care physician; [name], MD

## 2016-10-11 NOTE — Care Management (Signed)
HD information faxed [facility] liaison.  Requested Hep B screen and ppd placement.

## 2016-10-11 NOTE — Consult Note (Signed)
Pharmacy Antibiotic Note  Brandon Maynard. is a 60 y.o. male admitted on 10/04/2016 with bacteremia, cellulitis and UTI.  Pharmacy has been consulted for vancomycin and meropenem dosing. Patient was treated with one dose of vanc and zosyn on 11/6. meropenem from 11/7-11/9 and keflex from 11/1- present. Patient has ecoli bacteremia and UTI (70, 000 colonies). Pt has left lower extremity swelling/ cellulits, erythema with an open blister that is worsening. Pharmacy consulted to broaden abx. Patient has now been transitioned to HD due to worsening renal function - first HD session 11/11; second HD session 11/12, HD on hold for 11/13.    Plan: Vancomycin 2000 mg IV once for load. HD ended at 1656 on 11/12 and 2g loading dose of vanc given 11/12 at 1739 after HD.  Pt currently ordered vancomycin 1000 mg IV w/ HD thereafter as prn order. Pt was ordered for HD today but holding HD today. Will need to follow up on HD schedule daily and ensure doses are being given. Pt also being initiated on HD and may need lower doses if pt does not receive full 3-4 h HD sessions. Vanc trough prior to the 3rd HD session-will need to be ordered.  Meropenem 500mg  IV q24h .    Height: 6\' 3"  (190.5 cm) Weight: 189 lb 13.1 oz (86.1 kg) IBW/kg (Calculated) : 84.5  Temp (24hrs), Avg:98.9 F (37.2 C), Min:98.3 F (36.8 C), Max:99.3 F (37.4 C)   Recent Labs Lab 10/04/16 1552 10/04/16 1852 10/05/16 0406 10/06/16 0430 10/07/16 0713 10/08/16 0721 10/09/16 1048 10/10/16 1455 10/11/16 0809  WBC  --   --  13.1* 19.4* 24.5* 24.3*  --   --  12.9*  CREATININE  --   --  4.53* 4.66* 5.11* 5.07* 5.47* 4.81*  --   LATICACIDVEN 2.6* 1.7  --   --   --   --   --   --   --     Estimated Creatinine Clearance: 19 mL/min (by C-G formula based on SCr of 4.81 mg/dL (H)).    Allergies  Allergen Reactions  . Tetracyclines & Related Hives    Antimicrobials this admission: vancomycin 11/6 >> 11/6, 11/12>> zosyn 11/6 >>  11/6 Meropenem 11/7>> 11/9, 11/12>> CTX 11/9 >> 11/10 Keflex 11/10>>11/12  Dose adjustments this admission:   Microbiology results: 11/6 BCx: ecoli 11/6 UCx: 30,000 EColi  11/6 MRSA PCR: neg  Thank you for allowing pharmacy to be a part of this patient's care.  Rocky Morel 10/11/2016 11:55 AM

## 2016-10-11 NOTE — Consult Note (Signed)
[facility] Infectious Disease     Reason for Consult:cellulitis   Referring Physician: Nicholes Mango  Date of Admission:  10/04/2016   Principal Problem:   Acute delirium Active Problems:   Schizoaffective disorder ([place])   Thrombocytopenia (H[facility])   Left leg cellulitis   HPI: Brandon Maynard. is a 60 y.o. male admitted 11/6 with L leg pain, redness and swelling which began as a blister on left foot.  He was found to have sepsis and a severe LLE  cellulitis with blistering and bullae. Seen by vascular and felt arterial supply adequate. He has had acute worsening as well of CKD with baseline cr 2.1.   bcx and ucx + E coli  No  Fevers since admit  but wbc had increased to 24, now down to 12.9.    ABx  meropenem 11/6 ->11/9, keflex 11/10->11/12 then restart meropenem and vanco  vanco 11/6, then 11/12  Past Medical History:  Diagnosis Date  . Anemia   . Bipolar disorder ([place])   . Cancer ([place])   . Cataract    right eye  . DDD (degenerative disc disease)   . GERD (gastroesophageal reflux disease)   . Heart murmur   . Hyperlipidemia   . Hypertension   . Paranoid schizophrenia ([place])   . 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 Thyroid disease    Past Surgical History:  Procedure Laterality Date  . Dermal Abrasion  1973   Social History  Substance Use Topics  . Smoking status: Current Every Day Smoker    Last attempt to quit: 04/09/1991  . Smokeless tobacco: Never Used  . Alcohol use No   Family History  Problem Relation Age of Onset  . Brain cancer Maternal Uncle   . Breast cancer Mother   . Melanoma Mother   . Congestive Heart Failure Father   . Melanoma Father   . Breast cancer Maternal Aunt   . Diabetes Maternal Aunt     Allergies:  Allergies  Allergen Reactions  . Tetracyclines & Related Hives    Current antibiotics: Antibiotics Given (last 72 hours)    Date/Time Action Medication Dose Rate   10/08/16 2105 Given   cephALEXin (KEFLEX) capsule 500 mg 500 mg     10/09/16 1027 Given   cephALEXin (KEFLEX) capsule 500 mg 500 mg    10/09/16 2143 Given   cephALEXin (KEFLEX) capsule 500 mg 500 mg    10/10/16 0925 Given   cephALEXin (KEFLEX) capsule 500 mg 500 mg    10/10/16 1739 Given   meropenem (MERREM) 500 mg in sodium chloride 0.9 % 50 mL IVPB 500 mg 100 mL/hr   10/10/16 1739 Given   vancomycin (VANCOCIN) 2,000 mg in sodium chloride 0.9 % 500 mL IVPB 2,000 mg 250 mL/hr      MEDICATIONS: . asenapine  10 mg Sublingual QHS  . asenapine  5 mg Sublingual q morning - 10a  . brimonidine  1 drop Both Eyes Q12H   And  . timolol  1 drop Both Eyes Q12H  . docusate sodium  100 mg Oral BID  . FLUoxetine  40 mg Oral Daily  . latanoprost  1 drop Both Eyes QHS  . levothyroxine  50 mcg Oral QAC breakfast  . meropenem (MERREM) IV  500 mg Intravenous Q24H  . multivitamin with minerals  1 tablet Oral Daily  . pyridOXINE  100 mg Oral BID  . silver sulfADIAZINE   Topical BID  . simvastatin  20 mg Oral QHS  .  traZODone  100 mg Oral QHS  . tuberculin  5 Units Intradermal Once    Review of Systems - 11 systems reviewed and negative per HPI   OBJECTIVE: Temp:  [98 F (36.7 C)-99.3 F (37.4 C)] 98 F (36.7 C) (11/13 1335) Pulse Rate:  [73-80] 77 (11/13 1335) Resp:  [12-24] 19 (11/13 1335) BP: (109-133)/(62-77) 110/69 (11/13 1335) SpO2:  [96 %-100 %] 99 % (11/13 1335) Weight:  [86.1 kg (189 lb 13.1 oz)] 86.1 kg (189 lb 13.1 oz) (11/12 1654) Physical Exam  Constitutional: He is oriented to person, place, and time. Dishelved, chronically ill appearing HENT:  Mouth/Throat: Oropharynx is clear and moist. No oropharyngeal exudate.  Cardiovascular: Normal rate, regular rhythm and normal heart sounds. Exam reveals no gallop and no friction rub.  No murmur heard.  Pulmonary/Chest: Effort normal and breath sounds normal. No respiratory distress. He has no wheezes.  Abdominal: Soft. Bowel sounds are normal. He exhibits no distension. There is no tenderness.   Lymphadenopathy:  He has no cervical adenopathy.  Neurological: He is alert and oriented to person, place, and time.  LLE 2 + edema Skin: L leg with diffuse erythema to knee, receding now from line drawn previously. large buallea on dorsum of foot, some yellow drainage. Mod ttp, extensive ecchymosis LABS: Results for orders placed or performed during the hospital encounter of 10/04/16 (from the past 48 hour(s))  Phosphorus     Status: Abnormal   Collection Time: 10/09/16  5:08 PM  Result Value Ref Range   Phosphorus 5.0 (H) 2.5 - 4.6 mg/dL  Parathyroid hormone, intact (no Ca)     Status: None   Collection Time: 10/09/16  5:08 PM  Result Value Ref Range   PTH 45 15 - 65 pg/mL    Comment: (NOTE) Performed At: [facility] [place], [place] [id number] Lindon Romp MD HQ:[id number]   Renal function panel     Status: Abnormal   Collection Time: 10/10/16  2:55 PM  Result Value Ref Range   Sodium 138 135 - 145 mmol/L   Potassium 4.5 3.5 - 5.1 mmol/L   Chloride 109 101 - 111 mmol/L   CO2 21 (L) 22 - 32 mmol/L   Glucose, Bld 116 (H) 65 - 99 mg/dL   BUN 93 (H) 6 - 20 mg/dL   Creatinine, Ser 4.81 (H) 0.61 - 1.24 mg/dL   Calcium 7.6 (L) 8.9 - 10.3 mg/dL   Phosphorus 5.6 (H) 2.5 - 4.6 mg/dL   Albumin 1.4 (L) 3.5 - 5.0 g/dL   GFR calc non Af Amer 12 (L) >60 mL/min   GFR calc Af Amer 14 (L) >60 mL/min    Comment: (NOTE) The eGFR has been calculated using the CKD EPI equation. This calculation has not been validated in all clinical situations. eGFR's persistently <60 mL/min signify possible Chronic Kidney Disease.    Anion gap 8 5 - 15  CBC with Differential/Platelet     Status: Abnormal   Collection Time: 10/11/16  8:09 AM  Result Value Ref Range   WBC 12.9 (H) 3.8 - 10.6 K/uL   RBC 2.68 (L) 4.40 - 5.90 MIL/uL   Hemoglobin 8.1 (L) 13.0 - 18.0 g/dL   HCT 23.8 (L) 40.0 - 52.0 %   MCV 88.8 80.0 - 100.0 fL   MCH 30.1 26.0 - 34.0 pg   MCHC 33.9 32.0 - 36.0  g/dL   RDW 14.7 (H) 11.5 - 14.5 %   Platelets 72 (L) 150 -  440 K/uL   Neutrophils Relative % 78 %   Neutro Abs 10.1 (H) 1.4 - 6.5 K/uL   Lymphocytes Relative 11 %   Lymphs Abs 1.4 1.0 - 3.6 K/uL   Monocytes Relative 11 %   Monocytes Absolute 1.4 (H) 0.2 - 1.0 K/uL   Eosinophils Relative 0 %   Eosinophils Absolute 0.0 0 - 0.7 K/uL   Basophils Relative 0 %   Basophils Absolute 0.0 0 - 0.1 K/uL  Renal function panel     Status: Abnormal   Collection Time: 10/11/16 11:49 AM  Result Value Ref Range   Sodium 143 135 - 145 mmol/L   Potassium 4.4 3.5 - 5.1 mmol/L   Chloride 111 101 - 111 mmol/L   CO2 24 22 - 32 mmol/L   Glucose, Bld 113 (H) 65 - 99 mg/dL   BUN 70 (H) 6 - 20 mg/dL   Creatinine, Ser 3.96 (H) 0.61 - 1.24 mg/dL   Calcium 7.7 (L) 8.9 - 10.3 mg/dL   Phosphorus 5.7 (H) 2.5 - 4.6 mg/dL   Albumin 1.3 (L) 3.5 - 5.0 g/dL   GFR calc non Af Amer 15 (L) >60 mL/min   GFR calc Af Amer 17 (L) >60 mL/min    Comment: (NOTE) The eGFR has been calculated using the CKD EPI equation. This calculation has not been validated in all clinical situations. eGFR's persistently <60 mL/min signify possible Chronic Kidney Disease.    Anion gap 8 5 - 15   No components found for: ESR, C REACTIVE PROTEIN MICRO: Recent Results (from the past 720 hour(s))  Blood Culture (routine x 2)     Status: Abnormal   Collection Time: 10/04/16  3:51 PM  Result Value Ref Range Status   Specimen Description BLOOD RT [facility]  Final   Special Requests BOTTLES DRAWN AEROBIC AND ANAEROBIC 10CC  Final   Culture  Setup Time   Final    Organism ID to follow GRAM NEGATIVE RODS IN BOTH AEROBIC AND ANAEROBIC BOTTLES CRITICAL RESULT CALLED TO, READ BACK BY AND VERIFIED WITH: NATE COOKSON ON 10/05/16 AT 0448 BY TLB CONFIRMED BY TLB    Culture ESCHERICHIA COLI (A)  Final   Report Status 10/07/2016 FINAL  Final   Organism ID, Bacteria ESCHERICHIA COLI  Final      Susceptibility   Escherichia coli - MIC*    AMPICILLIN >=32  RESISTANT Resistant     CEFAZOLIN <=4 SENSITIVE Sensitive     CEFEPIME <=1 SENSITIVE Sensitive     CEFTAZIDIME <=1 SENSITIVE Sensitive     CEFTRIAXONE <=1 SENSITIVE Sensitive     CIPROFLOXACIN <=0.25 SENSITIVE Sensitive     GENTAMICIN <=1 SENSITIVE Sensitive     IMIPENEM <=0.25 SENSITIVE Sensitive     TRIMETH/SULFA >=320 RESISTANT Resistant     AMPICILLIN/SULBACTAM 16 INTERMEDIATE Intermediate     PIP/TAZO <=4 SENSITIVE Sensitive     Extended ESBL NEGATIVE Sensitive     * ESCHERICHIA COLI  Blood Culture ID Panel (Reflexed)     Status: Abnormal   Collection Time: 10/04/16  3:51 PM  Result Value Ref Range Status   Enterococcus species NOT DETECTED NOT DETECTED Final   Listeria monocytogenes NOT DETECTED NOT DETECTED Final   Staphylococcus species NOT DETECTED NOT DETECTED Final   Staphylococcus aureus NOT DETECTED NOT DETECTED Final   Streptococcus species NOT DETECTED NOT DETECTED Final   Streptococcus agalactiae NOT DETECTED NOT DETECTED Final   Streptococcus pneumoniae NOT DETECTED NOT DETECTED Final   Streptococcus pyogenes NOT  DETECTED NOT DETECTED Final   Acinetobacter baumannii NOT DETECTED NOT DETECTED Final   Enterobacteriaceae species DETECTED (A) NOT DETECTED Final    Comment: CRITICAL RESULT CALLED TO, READ BACK BY AND VERIFIED WITH: NATE COOKSON ON 10/05/16 AT 0448 BY TLB    Enterobacter cloacae complex NOT DETECTED NOT DETECTED Final   Escherichia coli DETECTED (A) NOT DETECTED Final    Comment: CRITICAL RESULT CALLED TO, READ BACK BY AND VERIFIED WITH: NATE COOKSON ON 10/05/16 AT 0448 BY TLB                                                                                                Klebsiella oxytoca NOT DETECTED NOT DETECTED Final   Klebsiella pneumoniae NOT DETECTED NOT DETECTED Final   Proteus species NOT DETECTED NOT DETECTED Final   Serratia marcescens NOT DETECTED NOT DETECTED Final   Carbapenem resistance NOT DETECTED NOT DETECTED Final   Haemophilus  influenzae NOT DETECTED NOT DETECTED Final   Neisseria meningitidis NOT DETECTED NOT DETECTED Final   Pseudomonas aeruginosa NOT DETECTED NOT DETECTED Final   Candida albicans NOT DETECTED NOT DETECTED Final   Candida glabrata NOT DETECTED NOT DETECTED Final   Candida krusei NOT DETECTED NOT DETECTED Final   Candida parapsilosis NOT DETECTED NOT DETECTED Final   Candida tropicalis NOT DETECTED NOT DETECTED Final  Blood Culture (routine x 2)     Status: Abnormal   Collection Time: 10/04/16  3:56 PM  Result Value Ref Range Status   Specimen Description BLOOD LT AC  Final   Special Requests BOTTLES DRAWN AEROBIC AND ANAEROBIC 10CC  Final   Culture  Setup Time   Final    GRAM NEGATIVE RODS IN BOTH AEROBIC AND ANAEROBIC BOTTLES CRITICAL RESULT CALLED TO, READ BACK BY AND VERIFIED WITH: NATE COOKSON ON 10/05/16 AT 0448 BY TLB CONFIRMED BY TLB    Culture ESCHERICHIA COLI (A)  Final   Report Status 10/07/2016 FINAL  Final  Urine culture     Status: Abnormal   Collection Time: 10/04/16  9:01 PM  Result Value Ref Range Status   Specimen Description URINE, RANDOM  Final   Special Requests NONE  Final   Culture 30,000 COLONIES/mL ESCHERICHIA COLI (A)  Final   Report Status 10/07/2016 FINAL  Final   Organism ID, Bacteria ESCHERICHIA COLI (A)  Final      Susceptibility   Escherichia coli - MIC*    AMPICILLIN >=32 RESISTANT Resistant     CEFAZOLIN <=4 SENSITIVE Sensitive     CEFTRIAXONE <=1 SENSITIVE Sensitive     CIPROFLOXACIN <=0.25 SENSITIVE Sensitive     GENTAMICIN <=1 SENSITIVE Sensitive     IMIPENEM <=0.25 SENSITIVE Sensitive     NITROFURANTOIN <=16 SENSITIVE Sensitive     TRIMETH/SULFA >=320 RESISTANT Resistant     AMPICILLIN/SULBACTAM 16 INTERMEDIATE Intermediate     PIP/TAZO <=4 SENSITIVE Sensitive     Extended ESBL NEGATIVE Sensitive     * 30,000 COLONIES/mL ESCHERICHIA COLI  MRSA PCR Screening     Status: None   Collection Time: 10/04/16  9:01 PM  Result Value Ref Range  Status   MRSA by PCR NEGATIVE NEGATIVE Final    Comment:        The GeneXpert MRSA Assay (FDA approved for NASAL specimens only), is one component of a comprehensive MRSA colonization surveillance program. It is not intended to diagnose MRSA infection nor to guide or monitor treatment for MRSA infections.   MRSA PCR Screening     Status: None   Collection Time: 10/06/16 11:59 AM  Result Value Ref Range Status   MRSA by PCR NEGATIVE NEGATIVE Final    Comment:        The GeneXpert MRSA Assay (FDA approved for NASAL specimens only), is one component of a comprehensive MRSA colonization surveillance program. It is not intended to diagnose MRSA infection nor to guide or monitor treatment for MRSA infections.     IMAGING: US Renal  Result Date: 10/05/2016 CLINICAL DATA:  Acute renal failure EXAM: RENAL / URINARY TRACT ULTRASOUND COMPLETE COMPARISON:  09/01/2016, 06/28/2016 FINDINGS: Right Kidney: Length: 13.6 cm. Increased cortical echogenicity. 1.3 x 1.2 x 1.1 cm cyst at the midpole. No hydronephrosis. Left Kidney: Length: 14 cm.  Increased cortical echogenicity.  No hydronephrosis. Bladder: Increased echogenicity along the dependent portion could relate to debris or possible wall thickening. IMPRESSION: 1. Increased cortical echogenicity bilaterally, consistent with medical renal disease. No hydronephrosis. 2. Increased echogenicity along the dependent portion of the bladder, this could represent bladder debris versus wall thickening. Electronically Signed   By: Donavan Foil M.D.   On: 10/05/2016 16:42   Ct Tibia Fibula Left Wo Contrast  Result Date: 10/07/2016 CLINICAL DATA:  Left lower extremity pain, swelling and redness with a large blister on the dorsal aspect of the left foot of unknown duration. Elevated white blood cell count and hypotension. EXAM: CT TIBIA FIBULA LEFT WITHOUT CONTRAST TECHNIQUE: Multidetector CT imaging was performed according to the standard protocol.  Multiplanar CT image reconstructions were also generated. COMPARISON:  None. FINDINGS: Imaged bones appear normal without destructive change or periosteal reaction. There is no fracture or dislocation. No evidence of arthropathy. Very small bone island in the medial talus is noted. Subcutaneous edema diffusely about the lower leg increases in intensity about the ankle and foot. A large blister type lesion in the cutaneous tissues over the dorsum of the foot measures 5.1 cm transverse by 1.5 cm craniocaudal by approximately 6.5 cm long and is centered over the second, third and fourth metatarsals. No septations or gas within the blister are identified. A second much smaller blister measuring 0.7 cm transverse by 0.3 cm craniocaudal by approximately 2.0 cm long is seen centered over the medial cuneiform in the dorsal soft tissues. No soft tissue gas including gas dissecting within fascial planes is seen. Fat planes within muscle are preserved. IMPRESSION: Subcutaneous edema about the left lower leg increases in intensity distally and is most consistent with cellulitis given the patient's history. 2 cutaneous fluid collections over the dorsum of the foot are consistent with blisters as described in the patient's history and could be septic or aseptic. Negative for evidence of myositis, fasciitis or osteomyelitis. Electronically Signed   By: Inge Rise M.D.   On: 10/07/2016 07:21   US Venous Img Lower Unilateral Left  Result Date: 10/04/2016 CLINICAL DATA:  Left leg swelling for 1 day. Redness and blistering. EXAM: LEFT LOWER EXTREMITY VENOUS DOPPLER ULTRASOUND TECHNIQUE: Gray-scale sonography with graded compression, as well as color Doppler and duplex ultrasound, were performed to evaluate the deep venous system from the level of the  common femoral vein through the popliteal and proximal calf veins. Spectral Doppler was utilized to evaluate flow at rest and with distal augmentation maneuvers. COMPARISON:   05/05/2011 FINDINGS: Right common femoral vein is patent without thrombus. Normal compressibility, augmentation and color Doppler flow in the left common femoral vein, left femoral vein and left popliteal vein. The left saphenofemoral junction is patent. Left profunda femoral vein is patent without thrombus. Subcutaneous edema in the left calf. Visualized left deep calf veins are patent without thrombus. IMPRESSION: Negative for deep venous thrombosis in left lower extremity. Electronically Signed   By: Markus Daft M.D.   On: 10/04/2016 16:50    Assessment:   Estelle Skibicki. is a 60 y.o. male with multiple medical problems admitted with severe bullous LLE cellulitis, ARF and E coli bacteremia with E coli UCX as well. His wbc markedly increased initially but now down to 12.9.  He reports he has had MRSA in past but I cannot find any records indicating such.  He is now on HD. Seems to be receding from line drawn and with less edema Initially on meropenem and vanco but then changed to keflex but it appeared to worsen although no fever and wbc nml now.  I suspect some worsening may be due to him not elevating and issues with compliance due to psych issue.  Recommendations I have cultured wound Dc meropenem and vanco and change to ancef - this should could strep, staph and the E coli in his blood Instructed pt to elevate on 3-4 pillows Will follow Thank you very much for allowing me to participate in the care of this patient. Please call with questions.   Cheral Marker. Ola Spurr, MD

## 2016-10-12 DIAGNOSIS — L03116 Cellulitis of left lower limb: Secondary | ICD-10-CM

## 2016-10-12 DIAGNOSIS — N189 Chronic kidney disease, unspecified: Secondary | ICD-10-CM

## 2016-10-12 LAB — RENAL FUNCTION PANEL
ANION GAP: 8 (ref 5–15)
Albumin: 1.3 g/dL — ABNORMAL LOW (ref 3.5–5.0)
BUN: 74 mg/dL — ABNORMAL HIGH (ref 6–20)
CALCIUM: 7.8 mg/dL — AB (ref 8.9–10.3)
CO2: 22 mmol/L (ref 22–32)
Chloride: 114 mmol/L — ABNORMAL HIGH (ref 101–111)
Creatinine, Ser: 4.26 mg/dL — ABNORMAL HIGH (ref 0.61–1.24)
GFR, EST AFRICAN AMERICAN: 16 mL/min — AB (ref >60)
GFR, EST NON AFRICAN AMERICAN: 14 mL/min — AB (ref >60)
GLUCOSE: 103 mg/dL — AB (ref 65–99)
PHOSPHORUS: 5.7 mg/dL — AB (ref 2.5–4.6)
POTASSIUM: 4.9 mmol/L (ref 3.5–5.1)
SODIUM: 144 mmol/L (ref 135–145)

## 2016-10-12 LAB — HEPATITIS B SURFACE ANTIGEN: HEP B S AG: NEGATIVE

## 2016-10-12 LAB — HEPATITIS B CORE ANTIBODY, IGM: Hep B C IgM: NEGATIVE

## 2016-10-12 LAB — HEPATITIS B SURFACE ANTIBODY,QUALITATIVE: HEP B S AB: NONREACTIVE

## 2016-10-12 LAB — HEPATITIS C ANTIBODY: HCV AB: 0.2 {s_co_ratio} (ref 0.0–0.9)

## 2016-10-12 MED ORDER — NEPRO/CARBSTEADY PO LIQD
237.0000 mL | Freq: Two times a day (BID) | ORAL | Status: DC
  Administered 2016-10-13 – 2016-10-16 (×6): 237 mL via ORAL

## 2016-10-12 MED ORDER — ASENAPINE MALEATE 5 MG SL SUBL
10.0000 mg | SUBLINGUAL_TABLET | Freq: Two times a day (BID) | SUBLINGUAL | Status: DC
  Administered 2016-10-12 – 2016-10-22 (×20): 10 mg via SUBLINGUAL
  Filled 2016-10-12 (×20): qty 2

## 2016-10-12 NOTE — Progress Notes (Signed)
[facility] PROGRESS NOTE Date of Admission:  10/04/2016     ID: Brandon Oiler. is a 60 y.o. male with  E coli sepsis and LE cellulitis Principal Problem:   Acute delirium Active Problems:   Schizoaffective disorder (HCC)   Thrombocytopenia (HCC)   Left leg cellulitis   Subjective: NO fevers, trying to elevate leg   ROS  Eleven systems are reviewed and negative except per hpi  Medications:  Antibiotics Given (last 72 hours)    Date/Time Action Medication Dose Rate   10/09/16 2143 Given   cephALEXin (KEFLEX) capsule 500 mg 500 mg    10/10/16 0925 Given   cephALEXin (KEFLEX) capsule 500 mg 500 mg    10/10/16 1739 Given   meropenem (MERREM) 500 mg in sodium chloride 0.9 % 50 mL IVPB 500 mg 100 mL/hr   10/10/16 1739 Given   vancomycin (VANCOCIN) 2,000 mg in sodium chloride 0.9 % 500 mL IVPB 2,000 mg 250 mL/hr   10/11/16 1935 Given   ceFAZolin (ANCEF) IVPB 1 g/50 mL premix 1 g 100 mL/hr     . asenapine  10 mg Sublingual QHS  . asenapine  5 mg Sublingual q morning - 10a  . brimonidine  1 drop Both Eyes Q12H   And  . timolol  1 drop Both Eyes Q12H  .  ceFAZolin (ANCEF) IV  1 g Intravenous q1800  . docusate sodium  100 mg Oral BID  . FLUoxetine  40 mg Oral Daily  . latanoprost  1 drop Both Eyes QHS  . levothyroxine  50 mcg Oral QAC breakfast  . multivitamin with minerals  1 tablet Oral Daily  . pyridOXINE  100 mg Oral BID  . silver sulfADIAZINE   Topical BID  . simvastatin  20 mg Oral QHS  . traZODone  100 mg Oral QHS  . tuberculin  5 Units Intradermal Once    Objective: Vital signs in last 24 hours: Temp:  [98.1 F (36.7 C)-98.4 F (36.9 C)] 98.1 F (36.7 C) (11/14 1300) Pulse Rate:  [71-100] 89 (11/14 1300) Resp:  [18-20] 20 (11/14 1300) BP: (102-120)/(56-68) 105/68 (11/14 1300) SpO2:  [98 %-100 %] 99 % (11/14 1300) Constitutional: He is oriented to person, place, and time. Dishelved, chronically ill appearing HENT:  Mouth/Throat:  Oropharynx is clear and moist. No oropharyngeal exudate.  Cardiovascular: Normal rate, regular rhythm and normal heart sounds. Exam reveals no gallop and no friction rub.  No murmur heard.  Pulmonary/Chest: Effort normal and breath sounds normal. No respiratory distress. He has no wheezes.  Abdominal: Soft. Bowel sounds are normal. He exhibits no distension. There is no tenderness.  Lymphadenopathy:  He has no cervical adenopathy.  Neurological: He is alert and oriented to person, place, and time.  LLE 2 + edema Skin: L leg with diffuse erythema to knee, receding now from line drawn previously. large buallea on dorsum of foot, some yellow drainage. Mod ttp, extensive ecchymosis  Lab Results  Recent Labs  10/11/16 0809 10/11/16 1149 10/12/16 0425  WBC 12.9*  --   --   HGB 8.1*  --   --   HCT 23.8*  --   --   NA  --  143 144  K  --  4.4 4.9  CL  --  111 114*  CO2  --  24 22  BUN  --  70* 74*  CREATININE  --  3.96* 4.26*    Microbiology: Results for orders placed or performed during  the hospital encounter of 10/04/16  Blood Culture (routine x 2)     Status: Abnormal   Collection Time: 10/04/16  3:51 PM  Result Value Ref Range Status   Specimen Description BLOOD RT [facility]  Final   Special Requests BOTTLES DRAWN AEROBIC AND ANAEROBIC 10CC  Final   Culture  Setup Time   Final    Organism ID to follow GRAM NEGATIVE RODS IN BOTH AEROBIC AND ANAEROBIC BOTTLES CRITICAL RESULT CALLED TO, READ BACK BY AND VERIFIED WITH: NATE COOKSON ON 10/05/16 AT 0448 BY TLB CONFIRMED BY TLB    Culture ESCHERICHIA COLI (A)  Final   Report Status 10/07/2016 FINAL  Final   Organism ID, Bacteria ESCHERICHIA COLI  Final      Susceptibility   Escherichia coli - MIC*    AMPICILLIN >=32 RESISTANT Resistant     CEFAZOLIN <=4 SENSITIVE Sensitive     CEFEPIME <=1 SENSITIVE Sensitive     CEFTAZIDIME <=1 SENSITIVE Sensitive     CEFTRIAXONE <=1 SENSITIVE Sensitive     CIPROFLOXACIN <=0.25 SENSITIVE Sensitive      GENTAMICIN <=1 SENSITIVE Sensitive     IMIPENEM <=0.25 SENSITIVE Sensitive     TRIMETH/SULFA >=320 RESISTANT Resistant     AMPICILLIN/SULBACTAM 16 INTERMEDIATE Intermediate     PIP/TAZO <=4 SENSITIVE Sensitive     Extended ESBL NEGATIVE Sensitive     * ESCHERICHIA COLI  Blood Culture ID Panel (Reflexed)     Status: Abnormal   Collection Time: 10/04/16  3:51 PM  Result Value Ref Range Status   Enterococcus species NOT DETECTED NOT DETECTED Final   Listeria monocytogenes NOT DETECTED NOT DETECTED Final   Staphylococcus species NOT DETECTED NOT DETECTED Final   Staphylococcus aureus NOT DETECTED NOT DETECTED Final   Streptococcus species NOT DETECTED NOT DETECTED Final   Streptococcus agalactiae NOT DETECTED NOT DETECTED Final   Streptococcus pneumoniae NOT DETECTED NOT DETECTED Final   Streptococcus pyogenes NOT DETECTED NOT DETECTED Final   Acinetobacter baumannii NOT DETECTED NOT DETECTED Final   Enterobacteriaceae species DETECTED (A) NOT DETECTED Final    Comment: CRITICAL RESULT CALLED TO, READ BACK BY AND VERIFIED WITH: NATE COOKSON ON 10/05/16 AT 0448 BY TLB    Enterobacter cloacae complex NOT DETECTED NOT DETECTED Final   Escherichia coli DETECTED (A) NOT DETECTED Final    Comment: CRITICAL RESULT CALLED TO, READ BACK BY AND VERIFIED WITH: NATE COOKSON ON 10/05/16 AT 0448 BY TLB                                                                                                Klebsiella oxytoca NOT DETECTED NOT DETECTED Final   Klebsiella pneumoniae NOT DETECTED NOT DETECTED Final   Proteus species NOT DETECTED NOT DETECTED Final   Serratia marcescens NOT DETECTED NOT DETECTED Final   Carbapenem resistance NOT DETECTED NOT DETECTED Final   Haemophilus influenzae NOT DETECTED NOT DETECTED Final   Neisseria meningitidis NOT DETECTED NOT DETECTED Final   Pseudomonas aeruginosa NOT DETECTED NOT DETECTED Final   Candida albicans NOT DETECTED NOT DETECTED Final   Candida glabrata  NOT DETECTED  NOT DETECTED Final   Candida krusei NOT DETECTED NOT DETECTED Final   Candida parapsilosis NOT DETECTED NOT DETECTED Final   Candida tropicalis NOT DETECTED NOT DETECTED Final  Blood Culture (routine x 2)     Status: Abnormal   Collection Time: 10/04/16  3:56 PM  Result Value Ref Range Status   Specimen Description BLOOD LT AC  Final   Special Requests BOTTLES DRAWN AEROBIC AND ANAEROBIC 10CC  Final   Culture  Setup Time   Final    GRAM NEGATIVE RODS IN BOTH AEROBIC AND ANAEROBIC BOTTLES CRITICAL RESULT CALLED TO, READ BACK BY AND VERIFIED WITH: NATE COOKSON ON 10/05/16 AT 0448 BY TLB CONFIRMED BY TLB    Culture ESCHERICHIA COLI (A)  Final   Report Status 10/07/2016 FINAL  Final  Urine culture     Status: Abnormal   Collection Time: 10/04/16  9:01 PM  Result Value Ref Range Status   Specimen Description URINE, RANDOM  Final   Special Requests NONE  Final   Culture 30,000 COLONIES/mL ESCHERICHIA COLI (A)  Final   Report Status 10/07/2016 FINAL  Final   Organism ID, Bacteria ESCHERICHIA COLI (A)  Final      Susceptibility   Escherichia coli - MIC*    AMPICILLIN >=32 RESISTANT Resistant     CEFAZOLIN <=4 SENSITIVE Sensitive     CEFTRIAXONE <=1 SENSITIVE Sensitive     CIPROFLOXACIN <=0.25 SENSITIVE Sensitive     GENTAMICIN <=1 SENSITIVE Sensitive     IMIPENEM <=0.25 SENSITIVE Sensitive     NITROFURANTOIN <=16 SENSITIVE Sensitive     TRIMETH/SULFA >=320 RESISTANT Resistant     AMPICILLIN/SULBACTAM 16 INTERMEDIATE Intermediate     PIP/TAZO <=4 SENSITIVE Sensitive     Extended ESBL NEGATIVE Sensitive     * 30,000 COLONIES/mL ESCHERICHIA COLI  MRSA PCR Screening     Status: None   Collection Time: 10/04/16  9:01 PM  Result Value Ref Range Status   MRSA by PCR NEGATIVE NEGATIVE Final    Comment:        The GeneXpert MRSA Assay (FDA approved for NASAL specimens only), is one component of a comprehensive MRSA colonization surveillance program. It is not intended to  diagnose MRSA infection nor to guide or monitor treatment for MRSA infections.   MRSA PCR Screening     Status: None   Collection Time: 10/06/16 11:59 AM  Result Value Ref Range Status   MRSA by PCR NEGATIVE NEGATIVE Final    Comment:        The GeneXpert MRSA Assay (FDA approved for NASAL specimens only), is one component of a comprehensive MRSA colonization surveillance program. It is not intended to diagnose MRSA infection nor to guide or monitor treatment for MRSA infections.   Aerobic Culture (superficial specimen)     Status: None (Preliminary result)   Collection Time: 10/11/16  4:03 PM  Result Value Ref Range Status   Specimen Description LEG  Final   Special Requests Normal  Final   Gram Stain NO WBC SEEN NO ORGANISMS SEEN   Final   Culture   Final    NO GROWTH < 24 HOURS Performed at [facility]    Report Status PENDING  Incomplete    Studies/Results: No results found.  Assessment/Plan: Brandon Doell. is a 60 y.o. male with multiple medical problems admitted with severe bullous LLE cellulitis, ARF and E coli bacteremia with E coli UCX as well. His wbc markedly increased initially but now down  to 12.9.  He reports he has had MRSA in past but I cannot find any records indicating such.  He is now on HD. Seems to be receding from line drawn and with less edema Initially on meropenem and vanco but then changed to keflex but it appeared to worsen although no fever and wbc nml now.  I suspect some worsening may be due to him not elevating and issues with compliance due to psych issue.  Recommendations cx pending Cont  ancef - this should could strep, staph and the E coli in his blood Instructed pt to elevate on 3-4 pillows Will follow Thank you very much for the consult. Will follow with you.  [place], DAVID P   10/12/2016, 3:14 PM

## 2016-10-12 NOTE — Clinical Social Work Note (Signed)
No changes from CSW since last Friday. Patient will be starting outpatient hemodialysis but does not have an outpatient schedule as of yet. Paul at patient's [facility]: [facility] is aware that patient was to potentially start dialysis and stated that patient could return regardless. Shela Leff MSW,LCSW [phone]

## 2016-10-12 NOTE — Progress Notes (Signed)
Frequent requests for fluids throughout the night; possibly consider having dietary spread out 1266ml/24hr fluid restriction. Barbaraann Faster, RN 6:34 AM 10/12/2016

## 2016-10-12 NOTE — Consult Note (Signed)
[facility] Consult   Reason for Consult:  Follow-up consult for 60 year old man with a history of chronic mental illness currently in the hospital with acute and complicated medical problems stemming from probably sepsis from a cellulitis and labile blood pressures. Updated problem as of today he is assessing capacity for decision-making Referring Physician:  Patel/Lateef Patient Identification: Brandon T Hinshaw Jr. MRN:  [id number] Principal Diagnosis: Acute delirium Diagnosis:   Patient Active Problem List   Diagnosis Date Noted  . Acute delirium [R41.0] 10/05/2016  . Left leg cellulitis [R15.400] 10/04/2016  . B12 deficiency [E53.8] 07/06/2016  . Anemia [D64.9] 06/16/2016  . Thrombocytopenia ([place]) [D69.6] 06/16/2016  . Weight loss [R63.4] 06/16/2016  . Esophageal reflux [K21.9] 05/12/2016  . 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 Schizoaffective disorder ([place]) [F25.9] 06/13/2014    Total Time spent with patient: 20 minutes  Subjective:   Brandon Pitter. is a 60 y.o. male patient admitted with "I'm not feeling good".  HPI:  Follow-up for Brandon Maynard. Patient does seem more animated and a little bit more giggly today. He made some odd bizarre statements and had some disorganized thinking. Doesn't seem to completely understand all of his medical care although he basically has the ideas right. He was interacting with his brother normally when I got there. He has not been disruptive or aggressive.  Past Psychiatric History: Chronic schizophrenia or schizoaffective disorder. Recently we have discontinued his Depakote out of concern for it worsening his thrombocytopenia and medical problems. He is still on his antipsychotic.  Risk to Self: Is patient at risk for suicide?: No Risk to Others:   Prior Inpatient Therapy:   Prior Outpatient Therapy:    Past Medical History:  Past Medical History:    Diagnosis Date  . Anemia   . Bipolar disorder ([place])   . Cancer ([place])   . Cataract    right eye  . DDD (degenerative disc disease)   . GERD (gastroesophageal reflux disease)   . Heart murmur   . Hyperlipidemia   . Hypertension   . Paranoid schizophrenia ([place])   . 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 Thyroid disease     Past Surgical History:  Procedure Laterality Date  . Dermal Abrasion  1973   Family History:  Family History  Problem Relation Age of Onset  . Brain cancer Maternal Uncle   . Breast cancer Mother   . Melanoma Mother   . Congestive Heart Failure Father   . Melanoma Father   . Breast cancer Maternal Aunt   . Diabetes Maternal Aunt    Family Psychiatric  History: No family history identified. Social History:  History  Alcohol Use No     History  Drug Use No    Social History   Social History  . Marital status: Single    Spouse name: N/A  . Number of children: N/A  . Years of education: N/A   Social History Main Topics  . Smoking status: Current Every Day Smoker    Last attempt to quit: 04/09/1991  . Smokeless tobacco: Never Used  . Alcohol use No  . Drug use: No  . Sexual activity: Not Asked   Other Topics Concern  . None   Social History Narrative  . None   Additional Social History:Patient's brother, who lives in [place], is the only relative actively involved in making decisions. I am told by nephrology that the brother has been  contacted and expresses agreement with proceeding with dialysis. Unfortunately he has not been made the healthcare power of attorney or guardian at this point.    Allergies:   Allergies  Allergen Reactions  . Tetracyclines & Related Hives    Labs:  Results for orders placed or performed during the hospital encounter of 10/04/16 (from the past 48 hour(s))  CBC with Differential/Platelet     Status: Abnormal   Collection Time: 10/11/16  8:09 AM  Result Value Ref Range   WBC 12.9 (H) 3.8 - 10.6 K/uL   RBC 2.68  (L) 4.40 - 5.90 MIL/uL   Hemoglobin 8.1 (L) 13.0 - 18.0 g/dL   HCT 23.8 (L) 40.0 - 52.0 %   MCV 88.8 80.0 - 100.0 fL   MCH 30.1 26.0 - 34.0 pg   MCHC 33.9 32.0 - 36.0 g/dL   RDW 14.7 (H) 11.5 - 14.5 %   Platelets 72 (L) 150 - 440 K/uL   Neutrophils Relative % 78 %   Neutro Abs 10.1 (H) 1.4 - 6.5 K/uL   Lymphocytes Relative 11 %   Lymphs Abs 1.4 1.0 - 3.6 K/uL   Monocytes Relative 11 %   Monocytes Absolute 1.4 (H) 0.2 - 1.0 K/uL   Eosinophils Relative 0 %   Eosinophils Absolute 0.0 0 - 0.7 K/uL   Basophils Relative 0 %   Basophils Absolute 0.0 0 - 0.1 K/uL  Renal function panel     Status: Abnormal   Collection Time: 10/11/16 11:49 AM  Result Value Ref Range   Sodium 143 135 - 145 mmol/L   Potassium 4.4 3.5 - 5.1 mmol/L   Chloride 111 101 - 111 mmol/L   CO2 24 22 - 32 mmol/L   Glucose, Bld 113 (H) 65 - 99 mg/dL   BUN 70 (H) 6 - 20 mg/dL   Creatinine, Ser 3.96 (H) 0.61 - 1.24 mg/dL   Calcium 7.7 (L) 8.9 - 10.3 mg/dL   Phosphorus 5.7 (H) 2.5 - 4.6 mg/dL   Albumin 1.3 (L) 3.5 - 5.0 g/dL   GFR calc non Af Amer 15 (L) >60 mL/min   GFR calc Af Amer 17 (L) >60 mL/min    Comment: (NOTE) The eGFR has been calculated using the CKD EPI equation. This calculation has not been validated in all clinical situations. eGFR's persistently <60 mL/min signify possible Chronic Kidney Disease.    Anion gap 8 5 - 15  Hepatitis B surface antibody     Status: None   Collection Time: 10/11/16 11:49 AM  Result Value Ref Range   Hep B S Ab Non Reactive     Comment: (NOTE)              Non Reactive: Inconsistent with immunity,                            less than 10 mIU/mL              Reactive:     Consistent with immunity,                            greater than 9.9 mIU/mL Performed At: [facility] [place], [place] [id number] Lindon Romp MD [id number]   Hepatitis B core antibody, IgM     Status: None   Collection Time: 10/11/16 11:49 AM  Result Value Ref  Range   Hep B  C IgM Negative Negative    Comment: (NOTE) Performed At: [facility] [place], [place] [id number] Lindon Romp MD [id number]   Hepatitis B surface antigen     Status: None   Collection Time: 10/11/16 11:49 AM  Result Value Ref Range   Hepatitis B Surface Ag Negative Negative    Comment: (NOTE) Performed At: [facility] [place], [place] [id number] Lindon Romp MD [id number]   Hepatitis C antibody     Status: None   Collection Time: 10/11/16 11:49 AM  Result Value Ref Range   HCV Ab 0.2 0.0 - 0.9 s/co ratio    Comment: (NOTE)                                  Negative:     < 0.8                             Indeterminate: 0.8 - 0.9                                  Positive:     > 0.9 The CDC recommends that a positive HCV antibody result be followed up with a HCV Nucleic Acid Amplification test ([id number]). Performed At: [facility] [place], [place] [id number] Lindon Romp MD [id number]   Aerobic Culture (superficial specimen)     Status: None (Preliminary result)   Collection Time: 10/11/16  4:03 PM  Result Value Ref Range   Specimen Description LEG    Special Requests Normal    Gram Stain NO WBC SEEN NO ORGANISMS SEEN     Culture      NO GROWTH < 24 HOURS Performed at [facility]    Report Status PENDING   Renal function panel     Status: Abnormal   Collection Time: 10/12/16  4:25 AM  Result Value Ref Range   Sodium 144 135 - 145 mmol/L   Potassium 4.9 3.5 - 5.1 mmol/L   Chloride 114 (H) 101 - 111 mmol/L   CO2 22 22 - 32 mmol/L   Glucose, Bld 103 (H) 65 - 99 mg/dL   BUN 74 (H) 6 - 20 mg/dL   Creatinine, Ser 4.26 (H) 0.61 - 1.24 mg/dL   Calcium 7.8 (L) 8.9 - 10.3 mg/dL   Phosphorus 5.7 (H) 2.5 - 4.6 mg/dL   Albumin 1.3 (L) 3.5 - 5.0 g/dL   GFR calc non Af Amer 14 (L) >60 mL/min   GFR calc Af Amer 16 (L) >60 mL/min    Comment: (NOTE) The eGFR has been  calculated using the CKD EPI equation. This calculation has not been validated in all clinical situations. eGFR's persistently <60 mL/min signify possible Chronic Kidney Disease.    Anion gap 8 5 - 15    Current Facility-Administered Medications  Medication Dose Route Frequency Provider Last Rate Last Dose  . 0.9 %  sodium chloride infusion  100 mL Intravenous PRN Munsoor Lateef, MD      . 0.9 %  sodium chloride infusion  100 mL Intravenous PRN Munsoor Lateef, MD      . acetaminophen (TYLENOL) tablet 650 mg  650 mg Oral Q6H PRN Fritzi Mandes, MD   650 mg at 10/12/16 2003  . alteplase (CATHFLO  ACTIVASE) injection 2 mg  2 mg Intracatheter Once PRN Munsoor Lateef, MD      . asenapine (SAPHRIS) sublingual tablet 10 mg  10 mg Sublingual BID Gonzella Lex, MD      . brimonidine (ALPHAGAN) 0.2 % ophthalmic solution 1 drop  1 drop Both Eyes Q12H Aruna Gouru, MD   1 drop at 10/12/16 1006   And  . timolol (TIMOPTIC) 0.5 % ophthalmic solution 1 drop  1 drop Both Eyes Q12H Nicholes Mango, MD   1 drop at 10/12/16 1007  . ceFAZolin (ANCEF) IVPB 1 g/50 mL premix  1 g Intravenous q1800 Napoleon Form, RPH   1 g at 10/12/16 1800  . docusate sodium (COLACE) capsule 100 mg  100 mg Oral BID Nicholes Mango, MD   100 mg at 10/12/16 1006  . [START ON 10/13/2016] feeding supplement (NEPRO CARB STEADY) liquid 237 mL  237 mL Oral BID BM Epifanio Lesches, MD      . FLUoxetine (PROZAC) capsule 40 mg  40 mg Oral Daily Aruna Gouru, MD   40 mg at 10/12/16 1006  . haloperidol lactate (HALDOL) injection 2 mg  2 mg Intravenous Q6H PRN Gonzella Lex, MD   2 mg at 10/10/16 0316  . heparin injection 1,000 Units  1,000 Units Dialysis PRN Munsoor Lateef, MD      . latanoprost (XALATAN) 0.005 % ophthalmic solution 1 drop  1 drop Both Eyes QHS Nicholes Mango, MD   1 drop at 10/11/16 2148  . levothyroxine (SYNTHROID, LEVOTHROID) tablet 50 mcg  50 mcg Oral QAC breakfast Nicholes Mango, MD   50 mcg at 10/12/16 0802  . lidocaine (PF)  (XYLOCAINE) 1 % injection 5 mL  5 mL Intradermal PRN Munsoor Lateef, MD      . lidocaine-prilocaine (EMLA) cream 1 application  1 application Topical PRN Munsoor Lateef, MD      . multivitamin with minerals tablet 1 tablet  1 tablet Oral Daily Nicholes Mango, MD   1 tablet at 10/12/16 1006  . ondansetron (ZOFRAN) tablet 4 mg  4 mg Oral Q6H PRN Nicholes Mango, MD       Or  . ondansetron (ZOFRAN) injection 4 mg  4 mg Intravenous Q6H PRN Nicholes Mango, MD      . oxyCODONE (Oxy IR/ROXICODONE) immediate release tablet 5 mg  5 mg Oral Q6H PRN Fritzi Mandes, MD   5 mg at 10/10/16 2244  . pentafluoroprop-tetrafluoroeth (GEBAUERS) aerosol 1 application  1 application Topical PRN Munsoor Lateef, MD      . pyridOXINE (VITAMIN B-6) tablet 100 mg  100 mg Oral BID Nicholes Mango, MD   100 mg at 10/12/16 1005  . silver sulfADIAZINE (SILVADENE) 1 % cream   Topical BID Fritzi Mandes, MD      . simvastatin (ZOCOR) tablet 20 mg  20 mg Oral QHS Nicholes Mango, MD   20 mg at 10/11/16 2105  . traZODone (DESYREL) tablet 100 mg  100 mg Oral QHS Nicholes Mango, MD   100 mg at 10/11/16 2105  . tuberculin injection 5 Units  5 Units Intradermal Once Lavonia Dana, MD   5 Units at 10/11/16 1748   Facility-Administered Medications Ordered in Other Encounters  Medication Dose Route Frequency Provider Last Rate Last Dose  . cyanocobalamin ((VITAMIN B-12)) injection 1,000 mcg  1,000 mcg Intramuscular Once Lequita Asal, MD        Musculoskeletal: Strength & Muscle Tone: decreased Gait & Station: unable to stand Patient leans: Backward  Psychiatric Specialty Exam: Physical Exam  Nursing note and vitals reviewed. Constitutional: He appears well-developed and well-nourished.  HENT:  Head: Normocephalic and atraumatic.  Eyes: Conjunctivae are normal. Pupils are equal, round, and reactive to light.  Neck: Normal range of motion.  Cardiovascular: Normal heart sounds.   Respiratory: No respiratory distress.  GI: Soft.  Musculoskeletal:  Normal range of motion.  Neurological: He is alert.  Skin: Skin is warm and dry.     Psychiatric: His affect is not labile. His speech is delayed and tangential. His speech is not slurred. He is slowed. He is not agitated. He does not express impulsivity. He expresses no suicidal ideation. He expresses no suicidal plans. He exhibits abnormal recent memory.    Review of Systems  Constitutional: Negative.   HENT: Negative.   Eyes: Negative.   Respiratory: Negative.   Cardiovascular: Negative.   Gastrointestinal: Negative.   Musculoskeletal: Positive for joint pain and myalgias.  Skin: Negative.   Neurological: Negative.   Psychiatric/Behavioral: Negative for depression, hallucinations, memory loss, substance abuse and suicidal ideas. The patient is not nervous/anxious and does not have insomnia.     Blood pressure 105/68, pulse 89, temperature 98.1 F (36.7 C), temperature source Oral, resp. rate 20, height _0  (1.905 m), weight 86.1 kg (189 lb 13.1 oz), SpO2 99 %.Body mass index is 23.73 kg/m.  General Appearance: Disheveled  Eye Contact:  None  Speech:  Garbled  Volume:  Decreased  Mood:  Irritable  Affect:  Constricted  Thought Process:  Goal Directed  Orientation:  Full (Time, Place, and Person)  Thought Content:  Patient has some disorganization of his thought but he is able to be redirected and he actually was able to discuss his current condition without reference to anything that appeared to be obviously delusional.  Suicidal Thoughts:  No  Homicidal Thoughts:  No  Memory:  Immediate;   Fair Recent;   Fair Remote;   Fair  Judgement:  Impaired  Insight:  Shallow  Psychomotor Activity:  Decreased  Concentration:  Concentration: Poor  Recall:  [employer] of Knowledge:  Fair  Language:  Fair  Akathisia:  No  Handed:  Right  AIMS (if indicated):     Assets:  Communication Skills Desire for Improvement Social Support  ADL's:  Impaired  Cognition:  Impaired,  Mild    Sleep:        Treatment Plan Summary: Daily contact with patient to assess and evaluate symptoms and progress in treatment, Medication management and Plan As far as his agitation I have put in orders for haloperidol 2 mg intravenous every 6 hours as needed for agitation related to delirium. This should help to control his behavior safely in the intensive care unit. We are continuing off the Depakote for now but he has been continued on his other psychiatric medicine and according to nursing has been compliant with them area   Patient does seem to be a little bit more hyper today. I am trying to keep an eye on this and hope that he does not get inappropriately manic area so far staying off the Depakote seems to be associated with a recovery and his platelet count. I am going to increase the dose of his Saphris to 10 mg twice a day to try and head off the risk of mania and psychosis. Continue supportive counseling and daily check in. Disposition: Supportive therapy provided about ongoing stressors.  Alethia Berthold, MD 10/12/2016 8:15 PM

## 2016-10-12 NOTE — Progress Notes (Signed)
[facility]  Daily Progress Note   Subjective  -  Patient much more alert today likely closer to his baseline mental illness. He is exhibiting some flight of ideas and confabulation's this evening. He states his leg is feeling much better and only has pain when somebody touches it area  Objective Vitals:   10/11/16 1335 10/11/16 2143 10/12/16 0651 10/12/16 1300  BP: 110/69 (!) 120/56 102/60 105/68  Pulse: 77 71 100 89  Resp: 19 18 18 20   Temp: 98 F (36.7 C) 98.4 F (36.9 C) 98.2 F (36.8 C) 98.1 F (36.7 C)  TempSrc: Oral Oral Oral Oral  SpO2: 99% 98% 100% 99%  Weight:      Height:        Intake/Output Summary (Last 24 hours) at 10/12/16 1933 Last data filed at 10/12/16 1900  Gross per 24 hour  Intake              890 ml  Output             3700 ml  Net            -2810 ml    PULM  Normal effort , no use of accessory muscles CV  No JVD, RRR Abd      No distended, nontender VASC  Left lower extremity with a marked reduction in erythema the discoloration is now returning to a more brawny color consistent with chronic venous stasis. There is extensive wrinkling of the skin. The foot and heel are dressed with a clean intact dressing.  Laboratory CBC    Component Value Date/Time   WBC 12.9 (H) 10/11/2016 0809   HGB 8.1 (L) 10/11/2016 0809   HCT 23.8 (L) 10/11/2016 0809   HCT 33.1 (L) 05/12/2016 1126   PLT 72 (L) 10/11/2016 0809   PLT 69 (LL) 05/12/2016 1126    BMET    Component Value Date/Time   NA 144 10/12/2016 0425   NA 141 02/23/2016 1152   K 4.9 10/12/2016 0425   CL 114 (H) 10/12/2016 0425   CO2 22 10/12/2016 0425   GLUCOSE 103 (H) 10/12/2016 0425   BUN 74 (H) 10/12/2016 0425   BUN 24 02/23/2016 1152   CREATININE 4.26 (H) 10/12/2016 0425   CALCIUM 7.8 (L) 10/12/2016 0425   GFRNONAA 14 (L) 10/12/2016 0425   GFRAA 16 (L) 10/12/2016 0425    Assessment/Planning:  Cellulitis of the lower extremity: Patient has been returned to IV  antibiotics and has had a dramatic improvement in the appearance of his leg. Elevation and dressings have also added to his improvement I'm sure. I would continue his course as ordered no changes at this time I defer the timing of his antibiotic and length of therapy to the medical service.  Acute on chronic renal insufficiency: Patient states that he was told by his kidney doctor that he does not need any more dialysis however my review of Dr. Assunta Gambles note suggests that we are still determining whether his kidney function will recover given his good urine output in contrast to his continued rise of his creatinine. Therefore, I will not remove the temporary femoral catheter at this time but I will not make plans yet for a tunneled catheter.  Hortencia Pilar  10/12/2016, 7:33 PM

## 2016-10-12 NOTE — Progress Notes (Signed)
[facility]  ROUNDING NOTE   Subjective:   Patient urinated on the floor while I was examining the patient.  UOP 3375 Creatinine 4.26 (3.96) cefazolin  Objective:  Vital signs in last 24 hours:  Temp:  [98 F (36.7 C)-98.4 F (36.9 C)] 98.2 F (36.8 C) (11/14 0651) Pulse Rate:  [71-100] 100 (11/14 0651) Resp:  [18-19] 18 (11/14 0651) BP: (102-120)/(56-69) 102/60 (11/14 0651) SpO2:  [98 %-100 %] 100 % (11/14 0651)  Weight change:  Filed Weights   10/09/16 1910 10/10/16 1450 10/10/16 1654  Weight: 86.2 kg (190 lb 0.6 oz) 86.1 kg (189 lb 13.1 oz) 86.1 kg (189 lb 13.1 oz)    Intake/Output: I/O last 3 completed shifts: In: 2012.3 [P.O.:1320; IV Piggyback:692.3] Out: 6025 [Urine:6025]   Intake/Output this shift:  Total I/O In: 480 [P.O.:480] Out: 1075 [Urine:1075]  Physical Exam: General: Laying in bed  Head: Normocephalic, atraumatic. Moist oral mucosal membranes  Eyes: Anicteric  Neck: Supple, trachea midline  Lungs:  Clear to auscultation, normal effort  Heart: S1S2 no rubs  Abdomen:  Soft, nontender, BS present  Extremities: 1+ edema left lower extremity, +erythema and clean dressings.   Neurologic: Awake, follows commands  Skin: Area of bullous on left foot increasing  Access:  Right femoral temp HD catheter, 11/11 Dr. Delana Meyer    Basic Metabolic Panel:  Recent Labs Lab 10/08/16 0721 10/09/16 1048 10/09/16 1708 10/10/16 1455 10/11/16 1149 10/12/16 0425  NA 135 134*  --  138 143 144  K 4.0 4.4  --  4.5 4.4 4.9  CL 108 108  --  109 111 114*  CO2 20* 18*  --  21* 24 22  GLUCOSE 84 141*  --  116* 113* 103*  BUN 109* 109*  --  93* 70* 74*  CREATININE 5.07* 5.47*  --  4.81* 3.96* 4.26*  CALCIUM 8.1* 7.7*  --  7.6* 7.7* 7.8*  PHOS 3.9  --  5.0* 5.6* 5.7* 5.7*    Liver Function Tests:  Recent Labs Lab 10/07/16 0713 10/08/16 0721 10/10/16 1455 10/11/16 1149 10/12/16 0425  ALBUMIN 1.6* 1.5* 1.4* 1.3* 1.3*   No results for input(s):  LIPASE, AMYLASE in the last 168 hours.  Recent Labs Lab 10/06/16 0430  AMMONIA 29    CBC:  Recent Labs Lab 10/06/16 0430 10/07/16 0713 10/08/16 0721 10/11/16 0809  WBC 19.4* 24.5* 24.3* 12.9*  NEUTROABS  --   --  20.1* 10.1*  HGB 9.5* 10.1* 9.7* 8.1*  HCT 28.2* 30.2* 29.4* 23.8*  MCV 91.8 93.0 92.7 88.8  PLT 22* 33* 40* 72*    Cardiac Enzymes: No results for input(s): CKTOTAL, CKMB, CKMBINDEX, TROPONINI in the last 168 hours.  BNP: Invalid input(s): POCBNP  CBG:  Recent Labs Lab 10/06/16 1153  GLUCAP 107*    Microbiology: Results for orders placed or performed during the hospital encounter of 10/04/16  Blood Culture (routine x 2)     Status: Abnormal   Collection Time: 10/04/16  3:51 PM  Result Value Ref Range Status   Specimen Description BLOOD RT [facility]  Final   Special Requests BOTTLES DRAWN AEROBIC AND ANAEROBIC 10CC  Final   Culture  Setup Time   Final    Organism ID to follow GRAM NEGATIVE RODS IN BOTH AEROBIC AND ANAEROBIC BOTTLES CRITICAL RESULT CALLED TO, READ BACK BY AND VERIFIED WITH: NATE COOKSON ON 10/05/16 AT 0448 BY TLB CONFIRMED BY TLB    Culture ESCHERICHIA COLI (A)  Final   Report Status  10/07/2016 FINAL  Final   Organism ID, Bacteria ESCHERICHIA COLI  Final      Susceptibility   Escherichia coli - MIC*    AMPICILLIN >=32 RESISTANT Resistant     CEFAZOLIN <=4 SENSITIVE Sensitive     CEFEPIME <=1 SENSITIVE Sensitive     CEFTAZIDIME <=1 SENSITIVE Sensitive     CEFTRIAXONE <=1 SENSITIVE Sensitive     CIPROFLOXACIN <=0.25 SENSITIVE Sensitive     GENTAMICIN <=1 SENSITIVE Sensitive     IMIPENEM <=0.25 SENSITIVE Sensitive     TRIMETH/SULFA >=320 RESISTANT Resistant     AMPICILLIN/SULBACTAM 16 INTERMEDIATE Intermediate     PIP/TAZO <=4 SENSITIVE Sensitive     Extended ESBL NEGATIVE Sensitive     * ESCHERICHIA COLI  Blood Culture ID Panel (Reflexed)     Status: Abnormal   Collection Time: 10/04/16  3:51 PM  Result Value Ref Range Status    Enterococcus species NOT DETECTED NOT DETECTED Final   Listeria monocytogenes NOT DETECTED NOT DETECTED Final   Staphylococcus species NOT DETECTED NOT DETECTED Final   Staphylococcus aureus NOT DETECTED NOT DETECTED Final   Streptococcus species NOT DETECTED NOT DETECTED Final   Streptococcus agalactiae NOT DETECTED NOT DETECTED Final   Streptococcus pneumoniae NOT DETECTED NOT DETECTED Final   Streptococcus pyogenes NOT DETECTED NOT DETECTED Final   Acinetobacter baumannii NOT DETECTED NOT DETECTED Final   Enterobacteriaceae species DETECTED (A) NOT DETECTED Final    Comment: CRITICAL RESULT CALLED TO, READ BACK BY AND VERIFIED WITH: NATE COOKSON ON 10/05/16 AT 0448 BY TLB    Enterobacter cloacae complex NOT DETECTED NOT DETECTED Final   Escherichia coli DETECTED (A) NOT DETECTED Final    Comment: CRITICAL RESULT CALLED TO, READ BACK BY AND VERIFIED WITH: NATE COOKSON ON 10/05/16 AT 0448 BY TLB                                                                                                Klebsiella oxytoca NOT DETECTED NOT DETECTED Final   Klebsiella pneumoniae NOT DETECTED NOT DETECTED Final   Proteus species NOT DETECTED NOT DETECTED Final   Serratia marcescens NOT DETECTED NOT DETECTED Final   Carbapenem resistance NOT DETECTED NOT DETECTED Final   Haemophilus influenzae NOT DETECTED NOT DETECTED Final   Neisseria meningitidis NOT DETECTED NOT DETECTED Final   Pseudomonas aeruginosa NOT DETECTED NOT DETECTED Final   Candida albicans NOT DETECTED NOT DETECTED Final   Candida glabrata NOT DETECTED NOT DETECTED Final   Candida krusei NOT DETECTED NOT DETECTED Final   Candida parapsilosis NOT DETECTED NOT DETECTED Final   Candida tropicalis NOT DETECTED NOT DETECTED Final  Blood Culture (routine x 2)     Status: Abnormal   Collection Time: 10/04/16  3:56 PM  Result Value Ref Range Status   Specimen Description BLOOD LT [facility]  Final   Special Requests BOTTLES DRAWN AEROBIC AND ANAEROBIC  10CC  Final   Culture  Setup Time   Final    GRAM NEGATIVE RODS IN BOTH AEROBIC AND ANAEROBIC BOTTLES CRITICAL RESULT CALLED TO, READ BACK BY AND VERIFIED WITH: NATE COOKSON ON  10/05/16 AT 0448 BY TLB CONFIRMED BY TLB    Culture ESCHERICHIA COLI (A)  Final   Report Status 10/07/2016 FINAL  Final  Urine culture     Status: Abnormal   Collection Time: 10/04/16  9:01 PM  Result Value Ref Range Status   Specimen Description URINE, RANDOM  Final   Special Requests NONE  Final   Culture 30,000 COLONIES/mL ESCHERICHIA COLI (A)  Final   Report Status 10/07/2016 FINAL  Final   Organism ID, Bacteria ESCHERICHIA COLI (A)  Final      Susceptibility   Escherichia coli - MIC*    AMPICILLIN >=32 RESISTANT Resistant     CEFAZOLIN <=4 SENSITIVE Sensitive     CEFTRIAXONE <=1 SENSITIVE Sensitive     CIPROFLOXACIN <=0.25 SENSITIVE Sensitive     GENTAMICIN <=1 SENSITIVE Sensitive     IMIPENEM <=0.25 SENSITIVE Sensitive     NITROFURANTOIN <=16 SENSITIVE Sensitive     TRIMETH/SULFA >=320 RESISTANT Resistant     AMPICILLIN/SULBACTAM 16 INTERMEDIATE Intermediate     PIP/TAZO <=4 SENSITIVE Sensitive     Extended ESBL NEGATIVE Sensitive     * 30,000 COLONIES/mL ESCHERICHIA COLI  MRSA PCR Screening     Status: None   Collection Time: 10/04/16  9:01 PM  Result Value Ref Range Status   MRSA by PCR NEGATIVE NEGATIVE Final    Comment:        The GeneXpert MRSA Assay (FDA approved for NASAL specimens only), is one component of a comprehensive MRSA colonization surveillance program. It is not intended to diagnose MRSA infection nor to guide or monitor treatment for MRSA infections.   MRSA PCR Screening     Status: None   Collection Time: 10/06/16 11:59 AM  Result Value Ref Range Status   MRSA by PCR NEGATIVE NEGATIVE Final    Comment:        The GeneXpert MRSA Assay (FDA approved for NASAL specimens only), is one component of a comprehensive MRSA colonization surveillance program. It is  not intended to diagnose MRSA infection nor to guide or monitor treatment for MRSA infections.   Aerobic Culture (superficial specimen)     Status: None (Preliminary result)   Collection Time: 10/11/16  4:03 PM  Result Value Ref Range Status   Specimen Description LEG  Final   Special Requests Normal  Final   Gram Stain NO WBC SEEN NO ORGANISMS SEEN   Final   Culture   Final    NO GROWTH < 24 HOURS Performed at [facility]    Report Status PENDING  Incomplete    Coagulation Studies: No results for input(s): LABPROT, INR in the last 72 hours.  Urinalysis: No results for input(s): COLORURINE, LABSPEC, PHURINE, GLUCOSEU, HGBUR, BILIRUBINUR, KETONESUR, PROTEINUR, UROBILINOGEN, NITRITE, LEUKOCYTESUR in the last 72 hours.  Invalid input(s): APPERANCEUR    Imaging: No results found.   Medications:    . asenapine  10 mg Sublingual QHS  . asenapine  5 mg Sublingual q morning - 10a  . brimonidine  1 drop Both Eyes Q12H   And  . timolol  1 drop Both Eyes Q12H  .  ceFAZolin (ANCEF) IV  1 g Intravenous q1800  . docusate sodium  100 mg Oral BID  . FLUoxetine  40 mg Oral Daily  . latanoprost  1 drop Both Eyes QHS  . levothyroxine  50 mcg Oral QAC breakfast  . multivitamin with minerals  1 tablet Oral Daily  . pyridOXINE  100 mg Oral BID  .  silver sulfADIAZINE   Topical BID  . simvastatin  20 mg Oral QHS  . traZODone  100 mg Oral QHS  . tuberculin  5 Units Intradermal Once   sodium chloride, sodium chloride, acetaminophen, alteplase, haloperidol lactate, heparin, lidocaine (PF), lidocaine-prilocaine, ondansetron **OR** ondansetron (ZOFRAN) IV, oxyCODONE, pentafluoroprop-tetrafluoroeth  Assessment/ Plan:  60 y.o. white male with past medication history of anemia, bipolar disorder, degenerative disc disease, GERD, hyperlipidemia, hypertension, paranoid schizophrenia, who was admitted to [facility] on 10/04/2016 for evaluation of left leg swelling and redness.   1.  Acute  renal failure on chronic kidney disease stage III: baseline creatinine of 2.1 EGFR 32 with proteinuria Acute renal failure secondary to sepsis, hypotension and ATN Requiring renal replacement. Two dialysis treatments 11/11 and 11/12.  Nonoliguric urine output. However creatinine continues to rise Chronic kidney disease secondary to hypertension  - Hold dialysis for today. Monitor for dialysis need daily.  - Pending PPD  2.  Hypotension: blood pressure has improved. Secondary to sepsis.  Takes labetalol at home.   3.  Left lower extremity cellulitis: with sepsis. E coli from blood cultures 11/6  Leukocytosis improving. Afebrile - changed to cefazolin   4. Anemia and thrombocytopenia: hemoglobin dropped to 8.1. Platelets improved off valproic acid Hematology consulted on 11/9   LOS: [place], [place] 11/14/201711:29 AM

## 2016-10-12 NOTE — Progress Notes (Signed)
[place] [facility] [place] NAME: Brandon Maynard    MR#:  [id number]  DATE OF BIRTH:  1956/02/21  SUBJECTIVE: Patient denies any complaints but appears to be confused, saying that he received $20 to help him get cigarettes. And he has confusion on and off.   Came in with left leg swelling and erythema Now on HD Urine output 4500 ml last 24 hrs Still some confusion REVIEW OF SYSTEMS:   Review of Systems  Constitutional: Positive for fever. Negative for chills and weight loss.  HENT: Negative for ear discharge, ear pain and nosebleeds.   Eyes: Negative for blurred vision, pain and discharge.  Respiratory: Negative for sputum production, shortness of breath, wheezing and stridor.   Cardiovascular: Positive for leg swelling. Negative for chest pain, palpitations, orthopnea and PND.  Gastrointestinal: Negative for abdominal pain, diarrhea, nausea and vomiting.  Genitourinary: Negative for frequency and urgency.  Musculoskeletal: Positive for joint pain. Negative for back pain.  Neurological: Positive for weakness. Negative for sensory change, speech change and focal weakness.  Psychiatric/Behavioral: Negative for depression and hallucinations. The patient is not nervous/anxious.    Tolerating Diet:yes  DRUG ALLERGIES:   Allergies  Allergen Reactions  . Tetracyclines & Related Hives    VITALS:  Blood pressure 105/68, pulse 89, temperature 98.1 F (36.7 C), temperature source Oral, resp. rate 20, height 6\' 3"  (1.905 m), weight 86.1 kg (189 lb 13.1 oz), SpO2 99 %.  PHYSICAL EXAMINATION:   Physical Exam  GENERAL:  60 y.o.-year-old patient lying in the bed with no acute distress.  EYES: Pupils equal, round, reactive to light and accommodation. No scleral icterus. Extraocular muscles intact.  HEENT: Head atraumatic, normocephalic. Oropharynx and nasopharynx clear.  NECK:  Supple, no jugular venous distention. No thyroid enlargement, no  tenderness.  LUNGS: Normal breath sounds bilaterally, no wheezing, rales, rhonchi. No use of accessory muscles of respiration.  CARDIOVASCULAR: S1, S2 normal. No murmurs, rubs, or gallops.  ABDOMEN: Soft, nontender, nondistended. Bowel sounds present. No organomegaly or mass.  EXTREMITIES: bilateral chronic edema with left leg swelling and large blister+ with erythema and swelling appears worse. Pulse is dopplerable .dressing present to the right leg. NEUROLOGIC:un Able to obtain. Neurologic exam because patient is confused at this time. PSYCHIATRIC:  patient is alert but falls asleep instantly and intermittent confusion SKIN: as above  LABORATORY PANEL:  CBC  Recent Labs Lab 10/11/16 0809  WBC 12.9*  HGB 8.1*  HCT 23.8*  PLT 72*    Chemistries   Recent Labs Lab 10/12/16 0425  NA 144  K 4.9  CL 114*  CO2 22  GLUCOSE 103*  BUN 74*  CREATININE 4.26*  CALCIUM 7.8*   Cardiac Enzymes No results for input(s): TROPONINI in the last 168 hours. RADIOLOGY:  No results found. ASSESSMENT AND PLAN:   Brandon Maynard  is a 60 y.o. male with a known history of Bipolar disorder, cataract, chronic thrombocytopenia is presenting to the ED with a chief complaint of significant worsening of left lower extremity pain, redness and swelling which started with a small blister on the left foot. Denies any insect bites  #Sepsis meets criteria with leukocytosis, elevated lactic acid and hypotension. Secondary to left leg cellulitis Gen. the antibiotics to Ancef, patient has Escherichia coli bacteremia . WBC improved from admission, now down to 12.9. Previous MRSA before. Elevated the leg. Cultured the wounds, follow the results. Pain management Keep legs elevated -Vascular surgery eval noted. Dr Ronalee Belts does  not feel any need for intervention for PAD. CT tibia-fibula shows inflammation and swelling. No loculated collection of fluid.  #Delirium. Patient has schizophrenia as per medical records.  Depakote is stopped because of concern for thrombocytopenia.  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 PPD is placed. Nephrology is watching closely daily.  #Hyponatremia Resolved  #Thrombocytopenia- improving.  -plt 42K---18K--22k -multifactorial. Pt has been evaluated by Oncology in the past and appears due to meds/sepsis and CKD -Depakote has been discontinued by Dr clapacs  # DVT prophylaxis with SCDs  # Paranoid schizophrenia/ Bipolar disorder - Seen by Dr Weber Cooks  Case discussed with Care Management/Social Worker. Management plans discussed with the patient, family and they are in agreement.  CODE STATUS:FULL  DVT Prophylaxis: SCD  TOTAL TIME TAKING CARE OF THIS PATIENT 35 minutes.   Note: This dictation was prepared with Dragon dictation along with smaller phrase technology. Any transcriptional errors that result from this process are unintentional.  Kaelyn Innocent M.D on 10/12/2016 at 1:39 PM  Between 7am to 6pm - Pager - [phone]  After 6pm go to [email or web address] - password [email or web address] [facility]  [facility]  Office  [phone]  CC: Primary care physician; [name], MD

## 2016-10-12 NOTE — Progress Notes (Signed)
Initial Nutrition Assessment  DOCUMENTATION CODES:   Severe malnutrition in context of chronic illness  INTERVENTION:   Nepro Shake po BID, each supplement provides 425 kcal and 19 grams protein  Discussed thirst quenchers  NUTRITION DIAGNOSIS:   Malnutrition (Severe) related to chronic illness (kidney disease) as evidenced by severe depletion of body fat, severe depletion of muscle mass.  GOAL:   Patient will meet greater than or equal to 90% of their needs  MONITOR:   PO intake, Supplement acceptance, Skin, I & O's  REASON FOR ASSESSMENT:   Consult  (pt requesting fluids at night)  ASSESSMENT:   60 y.o. male with a PMHx of anemia, bipolar disorder, degenerative disc disease, GERD, hyperlipidemia, hypertension, paranoid schizophrenia, who was admitted to [facility] on 10/04/2016 for evaluation of left leg swelling and redness.   Meal Completion: 100% Pt confused. Requesting more fluid. Pt is putting out 3 L of urine. Per RN confusion about if pt should be on a fluid restriction or not.    Medications reviewed and include: colace, MVI Labs reviewed: PO4 5.7 Nutrition-Focused physical exam completed. Findings are severe fat depletion, severe muscle depletion, and severe edema.     Diet Order:  Diet renal with fluid restriction Fluid restriction: 1200 mL Fluid; Room service appropriate? Yes; Fluid consistency: Thin  Skin:  Wound (see comment) (leg cellulitis)  Last BM:  11/14  Height:   Ht Readings from Last 1 Encounters:  10/06/16 6\' 3"  (1.905 m)    Weight:   Wt Readings from Last 1 Encounters:  10/10/16 189 lb 13.1 oz (86.1 kg)    Ideal Body Weight:  89 kg  BMI:  Body mass index is 23.73 kg/m.  Estimated Nutritional Needs:   Kcal:  2100-2300  Protein:  90-100 grams  Fluid:  1.2 L/day  EDUCATION NEEDS:   Education needs addressed  Maylon Peppers RD, [place], [place] Pager [phone] After Hours Pager

## 2016-10-12 NOTE — Care Management (Signed)
Results for Brandon Maynard, Brandon Maynard (MRN [id number]) as of 10/12/2016 11:04  Ref. Range 10/09/2016 17:08 10/11/2016 11:49  Hepatitis B Surface Ag Latest Ref Range: Negative  Negative Negative  Hep B S Ab Unknown  Non Reactive  Hep B Core Ab, IgM Latest Ref Range: Negative   Negative  Hepatitis B-Post Latest Ref Range: Immunity>9.9 mIU/mL <3.1 (L)   HCV Ab Latest Ref Range: 0.0 - 0.9 s/co ratio  0.2

## 2016-10-12 NOTE — Care Management Important Message (Signed)
Important Message  Patient Details  Name: Brandon Maynard. MRN: [id number] Date of Birth: 1956-09-07   Medicare Important Message Given:  Yes    Beverly Sessions, RN 10/12/2016, 2:07 PM

## 2016-10-12 NOTE — Care Management (Signed)
Hepatitis results faxed to [facility] Patient Pathway Liaison.

## 2016-10-13 LAB — CBC
HEMATOCRIT: 23.7 % — AB (ref 40.0–52.0)
Hemoglobin: 7.6 g/dL — ABNORMAL LOW (ref 13.0–18.0)
MCH: 29.9 pg (ref 26.0–34.0)
MCHC: 32.2 g/dL (ref 32.0–36.0)
MCV: 92.9 fL (ref 80.0–100.0)
Platelets: 136 10*3/uL — ABNORMAL LOW (ref 150–440)
RBC: 2.55 MIL/uL — AB (ref 4.40–5.90)
RDW: 14.5 % (ref 11.5–14.5)
WBC: 13.1 10*3/uL — AB (ref 3.8–10.6)

## 2016-10-13 LAB — RENAL FUNCTION PANEL
ANION GAP: 8 (ref 5–15)
Albumin: 1.4 g/dL — ABNORMAL LOW (ref 3.5–5.0)
BUN: 76 mg/dL — ABNORMAL HIGH (ref 6–20)
CHLORIDE: 118 mmol/L — AB (ref 101–111)
CO2: 21 mmol/L — ABNORMAL LOW (ref 22–32)
Calcium: 7.9 mg/dL — ABNORMAL LOW (ref 8.9–10.3)
Creatinine, Ser: 4.63 mg/dL — ABNORMAL HIGH (ref 0.61–1.24)
GFR, EST AFRICAN AMERICAN: 14 mL/min — AB (ref >60)
GFR, EST NON AFRICAN AMERICAN: 12 mL/min — AB (ref >60)
Glucose, Bld: 98 mg/dL (ref 65–99)
POTASSIUM: 5.3 mmol/L — AB (ref 3.5–5.1)
Phosphorus: 6.8 mg/dL — ABNORMAL HIGH (ref 2.5–4.6)
Sodium: 147 mmol/L — ABNORMAL HIGH (ref 135–145)

## 2016-10-13 NOTE — Consult Note (Signed)
[facility] Consult   Reason for Consult:  Follow-up consult for 60 year old man with a history of chronic mental illness currently in the hospital with acute and complicated medical problems stemming from probably sepsis from a cellulitis and labile blood pressures. Updated problem as of today he is assessing capacity for decision-making Referring Physician:  Patel/Lateef Patient Identification: Brandon T Petron Jr. MRN:  [id number] Principal Diagnosis: Acute delirium Diagnosis:   Patient Active Problem List   Diagnosis Date Noted  . Acute delirium [R41.0] 10/05/2016  . Left leg cellulitis [Q28.638] 10/04/2016  . B12 deficiency [E53.8] 07/06/2016  . Anemia [D64.9] 06/16/2016  . Thrombocytopenia ([place]) [D69.6] 06/16/2016  . Weight loss [R63.4] 06/16/2016  . Esophageal reflux [K21.9] 05/12/2016  . 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 Schizoaffective disorder ([place]) [F25.9] 06/13/2014    Total Time spent with patient: 20 minutes  Subjective:   Brandon Vanderwerf. is a 60 y.o. male patient admitted with "I'm not feeling good".  HPI: Follow-up Wednesday the 15th. Patient seen this morning. Patient continues to look a little more agitated than I might of hoped although not clearly manic. He is a little bit animated in his affect and his speech tends to be tangential. Didn't make any bizarre or grandiose statements. Not acting in a dangerous manner. He says that he slept okay. Good news is that his platelet count continues to come up. Tolerated the increased dose of Saphris without difficulty. Tolerating medication and cooperative  Past Psychiatric History: Chronic schizophrenia or schizoaffective disorder. Recently we have discontinued his Depakote out of concern for it worsening his thrombocytopenia and medical problems. He is still on his antipsychotic.  Risk to Self: Is patient at risk for suicide?: No Risk to  Others:   Prior Inpatient Therapy:   Prior Outpatient Therapy:    Past Medical History:  Past Medical History:  Diagnosis Date  . Anemia   . Bipolar disorder ([place])   . Cancer ([place])   . Cataract    right eye  . DDD (degenerative disc disease)   . GERD (gastroesophageal reflux disease)   . Heart murmur   . Hyperlipidemia   . Hypertension   . Paranoid schizophrenia ([place])   . 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 Thyroid disease     Past Surgical History:  Procedure Laterality Date  . Dermal Abrasion  1973   Family History:  Family History  Problem Relation Age of Onset  . Brain cancer Maternal Uncle   . Breast cancer Mother   . Melanoma Mother   . Congestive Heart Failure Father   . Melanoma Father   . Breast cancer Maternal Aunt   . Diabetes Maternal Aunt    Family Psychiatric  History: No family history identified. Social History:  History  Alcohol Use No     History  Drug Use No    Social History   Social History  . Marital status: Single    Spouse name: N/A  . Number of children: N/A  . Years of education: N/A   Social History Main Topics  . Smoking status: Current Every Day Smoker    Last attempt to quit: 04/09/1991  . Smokeless tobacco: Never Used  . Alcohol use No  . Drug use: No  . Sexual activity: Not Asked   Other Topics Concern  . None   Social History Narrative  . None   Additional Social History:Patient's brother, who lives in [place], is  the only relative actively involved in making decisions. I am told by nephrology that the brother has been contacted and expresses agreement with proceeding with dialysis. Unfortunately he has not been made the healthcare power of attorney or guardian at this point.    Allergies:   Allergies  Allergen Reactions  . Tetracyclines & Related Hives    Labs:  Results for orders placed or performed during the hospital encounter of 10/04/16 (from the past 48 hour(s))  Aerobic Culture (superficial specimen)      Status: None (Preliminary result)   Collection Time: 10/11/16  4:03 PM  Result Value Ref Range   Specimen Description LEG    Special Requests Normal    Gram Stain NO WBC SEEN NO ORGANISMS SEEN     Culture      NO GROWTH 2 DAYS Performed at [facility]    Report Status PENDING   Renal function panel     Status: Abnormal   Collection Time: 10/12/16  4:25 AM  Result Value Ref Range   Sodium 144 135 - 145 mmol/L   Potassium 4.9 3.5 - 5.1 mmol/L   Chloride 114 (H) 101 - 111 mmol/L   CO2 22 22 - 32 mmol/L   Glucose, Bld 103 (H) 65 - 99 mg/dL   BUN 74 (H) 6 - 20 mg/dL   Creatinine, Ser 4.26 (H) 0.61 - 1.24 mg/dL   Calcium 7.8 (L) 8.9 - 10.3 mg/dL   Phosphorus 5.7 (H) 2.5 - 4.6 mg/dL   Albumin 1.3 (L) 3.5 - 5.0 g/dL   GFR calc non Af Amer 14 (L) >60 mL/min   GFR calc Af Amer 16 (L) >60 mL/min    Comment: (NOTE) The eGFR has been calculated using the CKD EPI equation. This calculation has not been validated in all clinical situations. eGFR's persistently <60 mL/min signify possible Chronic Kidney Disease.    Anion gap 8 5 - 15  Renal function panel     Status: Abnormal   Collection Time: 10/13/16  5:54 AM  Result Value Ref Range   Sodium 147 (H) 135 - 145 mmol/L   Potassium 5.3 (H) 3.5 - 5.1 mmol/L   Chloride 118 (H) 101 - 111 mmol/L   CO2 21 (L) 22 - 32 mmol/L   Glucose, Bld 98 65 - 99 mg/dL   BUN 76 (H) 6 - 20 mg/dL   Creatinine, Ser 4.63 (H) 0.61 - 1.24 mg/dL   Calcium 7.9 (L) 8.9 - 10.3 mg/dL   Phosphorus 6.8 (H) 2.5 - 4.6 mg/dL   Albumin 1.4 (L) 3.5 - 5.0 g/dL   GFR calc non Af Amer 12 (L) >60 mL/min   GFR calc Af Amer 14 (L) >60 mL/min    Comment: (NOTE) The eGFR has been calculated using the CKD EPI equation. This calculation has not been validated in all clinical situations. eGFR's persistently <60 mL/min signify possible Chronic Kidney Disease.    Anion gap 8 5 - 15  CBC     Status: Abnormal   Collection Time: 10/13/16  2:00 PM  Result Value Ref  Range   WBC 13.1 (H) 3.8 - 10.6 K/uL   RBC 2.55 (L) 4.40 - 5.90 MIL/uL   Hemoglobin 7.6 (L) 13.0 - 18.0 g/dL   HCT 23.7 (L) 40.0 - 52.0 %   MCV 92.9 80.0 - 100.0 fL   MCH 29.9 26.0 - 34.0 pg   MCHC 32.2 32.0 - 36.0 g/dL   RDW 14.5 11.5 - 14.5 %  Platelets 136 (L) 150 - 440 K/uL    Current Facility-Administered Medications  Medication Dose Route Frequency Provider Last Rate Last Dose  . 0.9 %  sodium chloride infusion  100 mL Intravenous PRN Munsoor Lateef, MD      . 0.9 %  sodium chloride infusion  100 mL Intravenous PRN Munsoor Lateef, MD      . acetaminophen (TYLENOL) tablet 650 mg  650 mg Oral Q6H PRN Fritzi Mandes, MD   650 mg at 10/13/16 1321  . alteplase (CATHFLO ACTIVASE) injection 2 mg  2 mg Intracatheter Once PRN Munsoor Lateef, MD      . asenapine (SAPHRIS) sublingual tablet 10 mg  10 mg Sublingual BID Gonzella Lex, MD   10 mg at 10/13/16 0931  . brimonidine (ALPHAGAN) 0.2 % ophthalmic solution 1 drop  1 drop Both Eyes Q12H Aruna Gouru, MD   1 drop at 10/13/16 0932   And  . timolol (TIMOPTIC) 0.5 % ophthalmic solution 1 drop  1 drop Both Eyes Q12H Nicholes Mango, MD   1 drop at 10/13/16 0932  . ceFAZolin (ANCEF) IVPB 1 g/50 mL premix  1 g Intravenous q1800 Napoleon Form, RPH   1 g at 10/12/16 1800  . docusate sodium (COLACE) capsule 100 mg  100 mg Oral BID Nicholes Mango, MD   100 mg at 10/13/16 0931  . feeding supplement (NEPRO CARB STEADY) liquid 237 mL  237 mL Oral BID BM Epifanio Lesches, MD   237 mL at 10/13/16 1333  . FLUoxetine (PROZAC) capsule 40 mg  40 mg Oral Daily Nicholes Mango, MD   40 mg at 10/13/16 0931  . haloperidol lactate (HALDOL) injection 2 mg  2 mg Intravenous Q6H PRN Gonzella Lex, MD   2 mg at 10/10/16 0316  . heparin injection 1,000 Units  1,000 Units Dialysis PRN Munsoor Lateef, MD      . latanoprost (XALATAN) 0.005 % ophthalmic solution 1 drop  1 drop Both Eyes QHS Nicholes Mango, MD   1 drop at 10/12/16 2200  . levothyroxine (SYNTHROID, LEVOTHROID) tablet  50 mcg  50 mcg Oral QAC breakfast Nicholes Mango, MD   50 mcg at 10/13/16 0833  . lidocaine (PF) (XYLOCAINE) 1 % injection 5 mL  5 mL Intradermal PRN Munsoor Lateef, MD      . lidocaine-prilocaine (EMLA) cream 1 application  1 application Topical PRN Munsoor Lateef, MD      . multivitamin with minerals tablet 1 tablet  1 tablet Oral Daily Nicholes Mango, MD   1 tablet at 10/13/16 0931  . ondansetron (ZOFRAN) tablet 4 mg  4 mg Oral Q6H PRN Nicholes Mango, MD       Or  . ondansetron (ZOFRAN) injection 4 mg  4 mg Intravenous Q6H PRN Nicholes Mango, MD      . oxyCODONE (Oxy IR/ROXICODONE) immediate release tablet 5 mg  5 mg Oral Q6H PRN Fritzi Mandes, MD   5 mg at 10/10/16 2244  . pentafluoroprop-tetrafluoroeth (GEBAUERS) aerosol 1 application  1 application Topical PRN Munsoor Lateef, MD      . pyridOXINE (VITAMIN B-6) tablet 100 mg  100 mg Oral BID Nicholes Mango, MD   100 mg at 10/13/16 0931  . silver sulfADIAZINE (SILVADENE) 1 % cream   Topical BID Fritzi Mandes, MD      . simvastatin (ZOCOR) tablet 20 mg  20 mg Oral QHS Nicholes Mango, MD   20 mg at 10/12/16 2232  . traZODone (DESYREL) tablet 100 mg  100 mg Oral QHS Nicholes Mango, MD   100 mg at 10/12/16 2232  . tuberculin injection 5 Units  5 Units Intradermal Once Lavonia Dana, MD   5 Units at 10/11/16 1748   Facility-Administered Medications Ordered in Other Encounters  Medication Dose Route Frequency Provider Last Rate Last Dose  . cyanocobalamin ((VITAMIN B-12)) injection 1,000 mcg  1,000 mcg Intramuscular Once Lequita Asal, MD        Musculoskeletal: Strength & Muscle Tone: decreased Gait & Station: unable to stand Patient leans: Backward  Psychiatric Specialty Exam: Physical Exam  Nursing note and vitals reviewed. Constitutional: He appears well-developed and well-nourished.  HENT:  Head: Normocephalic and atraumatic.  Eyes: Conjunctivae are normal. Pupils are equal, round, and reactive to light.  Neck: Normal range of motion.  Cardiovascular:  Normal heart sounds.   Respiratory: No respiratory distress.  GI: Soft.  Musculoskeletal: Normal range of motion.  Neurological: He is alert.  Skin: Skin is warm and dry.     Psychiatric: His affect is labile. His speech is tangential. His speech is not delayed and not slurred. He is slowed. He is not agitated. He does not express impulsivity. He expresses no suicidal ideation. He expresses no suicidal plans. He exhibits abnormal recent memory.    Review of Systems  Constitutional: Negative.   HENT: Negative.   Eyes: Negative.   Respiratory: Negative.   Cardiovascular: Negative.   Gastrointestinal: Negative.   Musculoskeletal: Positive for joint pain and myalgias.  Skin: Negative.   Neurological: Negative.   Psychiatric/Behavioral: Negative for depression, hallucinations, memory loss, substance abuse and suicidal ideas. The patient is not nervous/anxious and does not have insomnia.     Blood pressure 110/64, pulse 91, temperature 98.2 F (36.8 C), temperature source Oral, resp. rate 15, height 6' 3"  (1.905 m), weight 91.7 kg (202 lb 2.6 oz), SpO2 93 %.Body mass index is 25.27 kg/m.  General Appearance: Disheveled  Eye Contact:  None  Speech:  Garbled  Volume:  Decreased  Mood:  Irritable  Affect:  Full Range  Thought Process:  Goal Directed  Orientation:  Full (Time, Place, and Person)  Thought Content:  Patient has some disorganization of his thought but he is able to be redirected and he actually was able to discuss his current condition without reference to anything that appeared to be obviously delusional.  Suicidal Thoughts:  No  Homicidal Thoughts:  No  Memory:  Immediate;   Fair Recent;   Fair Remote;   Fair  Judgement:  Impaired  Insight:  Shallow  Psychomotor Activity:  Decreased  Concentration:  Concentration: Poor  Recall:  [employer] of Knowledge:  Fair  Language:  Fair  Akathisia:  No  Handed:  Right  AIMS (if indicated):     Assets:  Communication  Skills Desire for Improvement Social Support  ADL's:  Impaired  Cognition:  Impaired,  Mild  Sleep:        Treatment Plan Summary: Daily contact with patient to assess and evaluate symptoms and progress in treatment, Medication management and Plan Continuing to keep an eye on him. Concern is still that we don't want him to become overly manic or agitated or psychotic to a degree that will cause distress or interfere with appropriate treatment. Hasn't gotten to that point so far. Continue off of the Depakote. Continue on the higher dose of Saphris. Continued daily monitoring.   Patient does seem to be a little bit more hyper today. I am trying to  keep an eye on this and hope that he does not get inappropriately manic area so far staying off the Depakote seems to be associated with a recovery and his platelet count. I am going to increase the dose of his Saphris to 10 mg twice a day to try and head off the risk of mania and psychosis. Continue supportive counseling and daily check in. Disposition: Supportive therapy provided about ongoing stressors.  Alethia Berthold, MD 10/13/2016 3:07 PM

## 2016-10-13 NOTE — Progress Notes (Signed)
Pt PPD was negative.

## 2016-10-13 NOTE — Progress Notes (Signed)
Dialysis started 

## 2016-10-13 NOTE — Plan of Care (Signed)
Problem: Safety: Goal: Ability to remain free from injury will improve Outcome: Progressing Pt has remained free from falls during my care. Pt has been reminded to call repeatedly for assistance when needing to get OOB. Pt's bed alarm has been activated as a reminder, but has been triggered twice by pt getting out of bed to chair.   Problem: Pain Managment: Goal: General experience of comfort will improve Outcome: Progressing Pt has requested PRN Tylenol once during my shift for a pain score of 7/10. Re-assessment not completed because pt went to dialysis.  Problem: Fluid Volume: Goal: Ability to maintain a balanced intake and output will improve Outcome: Progressing Fluid restriction was released and pt now able to drink during my shift.

## 2016-10-13 NOTE — Progress Notes (Signed)
Post HD assessment  

## 2016-10-13 NOTE — Progress Notes (Signed)
Brandon Maynard at [place] NAME: Brandon Maynard    MR#:  [id number]  DATE OF BIRTH:  [birth year]  SUBJECTIVE: Patient is seen today, he is very happy that he got shower Brandon Maynard after 2 half months and appears to be very hyper today. For hemodialysis.   Came in with left leg swelling and erythema Now on HD Urine output 4500 ml last 24 hrs Still some confusion REVIEW OF SYSTEMS:   Review of Systems  Constitutional: Positive for fever. Negative for chills and weight loss.  HENT: Negative for ear discharge, ear pain and nosebleeds.   Eyes: Negative for blurred vision, pain and discharge.  Respiratory: Negative for sputum production, shortness of breath, wheezing and stridor.   Cardiovascular: Positive for leg swelling. Negative for chest pain, palpitations, orthopnea and PND.  Gastrointestinal: Negative for abdominal pain, diarrhea, nausea and vomiting.  Genitourinary: Negative for frequency and urgency.  Musculoskeletal: Positive for joint pain. Negative for back pain.  Neurological: Positive for weakness. Negative for sensory change, speech change and focal weakness.  Psychiatric/Behavioral: Negative for depression and hallucinations. The patient is not nervous/anxious.    Tolerating Diet:yes  DRUG ALLERGIES:   Allergies  Allergen Reactions  . Tetracyclines & Related Hives    VITALS:  Blood pressure 117/72, pulse 82, temperature 98.7 F (37.1 C), temperature source Oral, resp. rate 18, height 6\' 3"  (1.905 m), weight 86.1 kg (189 lb 13.1 oz), SpO2 100 %.  PHYSICAL EXAMINATION:   Physical Exam  GENERAL:  60 y.o.-year-old patient lying in the bed with no acute distress.  EYES: Pupils equal, round, reactive to light and accommodation. No scleral icterus. Extraocular muscles intact.  HEENT: Head atraumatic, normocephalic. Oropharynx and nasopharynx clear.  NECK:  Supple, no jugular venous distention. No thyroid enlargement, no tenderness.   LUNGS: Normal breath sounds bilaterally, no wheezing, rales, rhonchi. No use of accessory muscles of respiration.  CARDIOVASCULAR: S1, S2 normal. No murmurs, rubs, or gallops.  ABDOMEN: Soft, nontender, nondistended. Bowel sounds present. No organomegaly or mass.  EXTREMITIES: bilateral chronic edema with left leg swelling and large blister+ with erythema and swelling appears worse. Pulse is dopplerable .dressing present to the right leg. NEUROLOGIC:un Able to obtain. Neurologic exam because patient is confused at this time. PSYCHIATRIC:  patient is alert but falls asleep instantly and intermittent confusion SKIN: as above  LABORATORY PANEL:  CBC  Recent Labs Lab 10/11/16 0809  WBC 12.9*  HGB 8.1*  HCT 23.8*  PLT 72*    Chemistries   Recent Labs Lab 10/13/16 0554  NA 147*  K 5.3*  CL 118*  CO2 21*  GLUCOSE 98  BUN 76*  CREATININE 4.63*  CALCIUM 7.9*   Cardiac Enzymes No results for input(s): TROPONINI in the last 168 hours. RADIOLOGY:  No results found. ASSESSMENT AND PLAN:   Brandon Maynard  is a 60 y.o. male with a known history of Bipolar disorder, cataract, chronic thrombocytopenia is presenting to the ED with a chief complaint of significant worsening of left lower extremity pain, redness and swelling which started with a small blister on the left foot. Denies any insect bites  #Sepsis meets criteria with leukocytosis, elevated lactic acid and hypotension. Secondary to left leg cellulitis On   antibiotics  Ancef, patient has Escherichia coli bacteremia . WBC improved from admission, now down to 12.9. Previous MRSA before. Elevated the leg. Cultured the wounds, follow the results. Pain management Keep legs elevated -Vascular surgery eval noted. Dr Ronalee Belts  does not feel any need for intervention for PAD. CT tibia-fibula shows inflammation and swelling. No loculated collection of fluid.  #Delirium. Patient has schizophrenia as per medical records. Depakote is stopped  because of concern for thrombocytopenia.  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 PPD is placed. Nephrology is watching closely daily. For HD today, #Hyponatremia Resolved  #Thrombocytopenia- improving.  -plt 42K---18K--22k -multifactorial. Pt has been evaluated by Oncology in the past and appears due to meds/sepsis and CKD -Depakote has been discontinued by Dr clapacs  # DVT prophylaxis with SCDs  # Paranoid schizophrenia/ Bipolar disorder - Seen by Dr Weber Cooks  Case discussed with Care Management/Social Worker. Management plans discussed with the patient, family and they are in agreement.  CODE STATUS:FULL  DVT Prophylaxis: SCD  TOTAL TIME TAKING CARE OF THIS PATIENT 35 minutes.   Note: This dictation was prepared with Dragon dictation along with smaller phrase technology. Any transcriptional errors that result from this process are unintentional.  Brandon Maynard M.D on 10/13/2016 at 1:20 PM  Between 7am to 6pm - Pager - [phone]  After 6pm go to [email or web address] - password [email or web address] [facility]  [facility]  Office  [phone]  CC: Primary care physician; [name], MD

## 2016-10-13 NOTE — Progress Notes (Signed)
Pre Dialysis 

## 2016-10-13 NOTE — Progress Notes (Signed)
[facility]  ROUNDING NOTE   Subjective:   UOP 3800 Creatinine 4.63 (4.26) K 5.3   Objective:  Vital signs in last 24 hours:  Temp:  [98.2 F (36.8 C)-99.3 F (37.4 C)] 98.7 F (37.1 C) (11/15 1224) Pulse Rate:  [67-83] 82 (11/15 1224) Resp:  [17-18] 18 (11/15 1224) BP: (106-120)/(59-74) 117/72 (11/15 1224) SpO2:  [100 %] 100 % (11/15 1224)  Weight change:  Filed Weights   10/09/16 1910 10/10/16 1450 10/10/16 1654  Weight: 86.2 kg (190 lb 0.6 oz) 86.1 kg (189 lb 13.1 oz) 86.1 kg (189 lb 13.1 oz)    Intake/Output: I/O last 3 completed shifts: In: 1010 [P.O.:960; IV Piggyback:50] Out: 9379 [Urine:5875]   Intake/Output this shift:  Total I/O In: 120 [P.O.:120] Out: 825 [Urine:825]  Physical Exam: General: Laying in bed  Head: Normocephalic, atraumatic. Moist oral mucosal membranes  Eyes: Anicteric  Neck: Supple, trachea midline  Lungs:  Clear to auscultation, normal effort  Heart: S1S2 no rubs  Abdomen:  Soft, nontender, BS present  Extremities: 1+ edema left lower extremity, +erythema and clean dressings.   Neurologic: Awake, follows commands  Skin: Left foot induration  Access:  Right femoral temp HD catheter, 11/11 Dr. Delana Meyer    Basic Metabolic Panel:  Recent Labs Lab 10/09/16 1048 10/09/16 1708 10/10/16 1455 10/11/16 1149 10/12/16 0425 10/13/16 0554  NA 134*  --  138 143 144 147*  K 4.4  --  4.5 4.4 4.9 5.3*  CL 108  --  109 111 114* 118*  CO2 18*  --  21* 24 22 21*  GLUCOSE 141*  --  116* 113* 103* 98  BUN 109*  --  93* 70* 74* 76*  CREATININE 5.47*  --  4.81* 3.96* 4.26* 4.63*  CALCIUM 7.7*  --  7.6* 7.7* 7.8* 7.9*  PHOS  --  5.0* 5.6* 5.7* 5.7* 6.8*    Liver Function Tests:  Recent Labs Lab 10/08/16 0721 10/10/16 1455 10/11/16 1149 10/12/16 0425 10/13/16 0554  ALBUMIN 1.5* 1.4* 1.3* 1.3* 1.4*   No results for input(s): LIPASE, AMYLASE in the last 168 hours. No results for input(s): AMMONIA in the last 168  hours.  CBC:  Recent Labs Lab 10/07/16 0713 10/08/16 0721 10/11/16 0809  WBC 24.5* 24.3* 12.9*  NEUTROABS  --  20.1* 10.1*  HGB 10.1* 9.7* 8.1*  HCT 30.2* 29.4* 23.8*  MCV 93.0 92.7 88.8  PLT 33* 40* 72*    Cardiac Enzymes: No results for input(s): CKTOTAL, CKMB, CKMBINDEX, TROPONINI in the last 168 hours.  BNP: Invalid input(s): POCBNP  CBG: No results for input(s): GLUCAP in the last 168 hours.  Microbiology: Results for orders placed or performed during the hospital encounter of 10/04/16  Blood Culture (routine x 2)     Status: Abnormal   Collection Time: 10/04/16  3:51 PM  Result Value Ref Range Status   Specimen Description BLOOD RT [facility]  Final   Special Requests BOTTLES DRAWN AEROBIC AND ANAEROBIC 10CC  Final   Culture  Setup Time   Final    Organism ID to follow GRAM NEGATIVE RODS IN BOTH AEROBIC AND ANAEROBIC BOTTLES CRITICAL RESULT CALLED TO, READ BACK BY AND VERIFIED WITH: NATE COOKSON ON 10/05/16 AT 0448 BY TLB CONFIRMED BY TLB    Culture ESCHERICHIA COLI (A)  Final   Report Status 10/07/2016 FINAL  Final   Organism ID, Bacteria ESCHERICHIA COLI  Final      Susceptibility   Escherichia coli - MIC*  AMPICILLIN >=32 RESISTANT Resistant     CEFAZOLIN <=4 SENSITIVE Sensitive     CEFEPIME <=1 SENSITIVE Sensitive     CEFTAZIDIME <=1 SENSITIVE Sensitive     CEFTRIAXONE <=1 SENSITIVE Sensitive     CIPROFLOXACIN <=0.25 SENSITIVE Sensitive     GENTAMICIN <=1 SENSITIVE Sensitive     IMIPENEM <=0.25 SENSITIVE Sensitive     TRIMETH/SULFA >=320 RESISTANT Resistant     AMPICILLIN/SULBACTAM 16 INTERMEDIATE Intermediate     PIP/TAZO <=4 SENSITIVE Sensitive     Extended ESBL NEGATIVE Sensitive     * ESCHERICHIA COLI  Blood Culture ID Panel (Reflexed)     Status: Abnormal   Collection Time: 10/04/16  3:51 PM  Result Value Ref Range Status   Enterococcus species NOT DETECTED NOT DETECTED Final   Listeria monocytogenes NOT DETECTED NOT DETECTED Final    Staphylococcus species NOT DETECTED NOT DETECTED Final   Staphylococcus aureus NOT DETECTED NOT DETECTED Final   Streptococcus species NOT DETECTED NOT DETECTED Final   Streptococcus agalactiae NOT DETECTED NOT DETECTED Final   Streptococcus pneumoniae NOT DETECTED NOT DETECTED Final   Streptococcus pyogenes NOT DETECTED NOT DETECTED Final   Acinetobacter baumannii NOT DETECTED NOT DETECTED Final   Enterobacteriaceae species DETECTED (A) NOT DETECTED Final    Comment: CRITICAL RESULT CALLED TO, READ BACK BY AND VERIFIED WITH: NATE COOKSON ON 10/05/16 AT 0448 BY TLB    Enterobacter cloacae complex NOT DETECTED NOT DETECTED Final   Escherichia coli DETECTED (A) NOT DETECTED Final    Comment: CRITICAL RESULT CALLED TO, READ BACK BY AND VERIFIED WITH: NATE COOKSON ON 10/05/16 AT 0448 BY TLB                                                                                                Klebsiella oxytoca NOT DETECTED NOT DETECTED Final   Klebsiella pneumoniae NOT DETECTED NOT DETECTED Final   Proteus species NOT DETECTED NOT DETECTED Final   Serratia marcescens NOT DETECTED NOT DETECTED Final   Carbapenem resistance NOT DETECTED NOT DETECTED Final   Haemophilus influenzae NOT DETECTED NOT DETECTED Final   Neisseria meningitidis NOT DETECTED NOT DETECTED Final   Pseudomonas aeruginosa NOT DETECTED NOT DETECTED Final   Candida albicans NOT DETECTED NOT DETECTED Final   Candida glabrata NOT DETECTED NOT DETECTED Final   Candida krusei NOT DETECTED NOT DETECTED Final   Candida parapsilosis NOT DETECTED NOT DETECTED Final   Candida tropicalis NOT DETECTED NOT DETECTED Final  Blood Culture (routine x 2)     Status: Abnormal   Collection Time: 10/04/16  3:56 PM  Result Value Ref Range Status   Specimen Description BLOOD LT [facility]  Final   Special Requests BOTTLES DRAWN AEROBIC AND ANAEROBIC 10CC  Final   Culture  Setup Time   Final    GRAM NEGATIVE RODS IN BOTH AEROBIC AND ANAEROBIC  BOTTLES CRITICAL RESULT CALLED TO, READ BACK BY AND VERIFIED WITH: NATE COOKSON ON 10/05/16 AT 0448 BY TLB CONFIRMED BY TLB    Culture ESCHERICHIA COLI (A)  Final   Report Status 10/07/2016 FINAL  Final  Urine culture  Status: Abnormal   Collection Time: 10/04/16  9:01 PM  Result Value Ref Range Status   Specimen Description URINE, RANDOM  Final   Special Requests NONE  Final   Culture 30,000 COLONIES/mL ESCHERICHIA COLI (A)  Final   Report Status 10/07/2016 FINAL  Final   Organism ID, Bacteria ESCHERICHIA COLI (A)  Final      Susceptibility   Escherichia coli - MIC*    AMPICILLIN >=32 RESISTANT Resistant     CEFAZOLIN <=4 SENSITIVE Sensitive     CEFTRIAXONE <=1 SENSITIVE Sensitive     CIPROFLOXACIN <=0.25 SENSITIVE Sensitive     GENTAMICIN <=1 SENSITIVE Sensitive     IMIPENEM <=0.25 SENSITIVE Sensitive     NITROFURANTOIN <=16 SENSITIVE Sensitive     TRIMETH/SULFA >=320 RESISTANT Resistant     AMPICILLIN/SULBACTAM 16 INTERMEDIATE Intermediate     PIP/TAZO <=4 SENSITIVE Sensitive     Extended ESBL NEGATIVE Sensitive     * 30,000 COLONIES/mL ESCHERICHIA COLI  MRSA PCR Screening     Status: None   Collection Time: 10/04/16  9:01 PM  Result Value Ref Range Status   MRSA by PCR NEGATIVE NEGATIVE Final    Comment:        The GeneXpert MRSA Assay (FDA approved for NASAL specimens only), is one component of a comprehensive MRSA colonization surveillance program. It is not intended to diagnose MRSA infection nor to guide or monitor treatment for MRSA infections.   MRSA PCR Screening     Status: None   Collection Time: 10/06/16 11:59 AM  Result Value Ref Range Status   MRSA by PCR NEGATIVE NEGATIVE Final    Comment:        The GeneXpert MRSA Assay (FDA approved for NASAL specimens only), is one component of a comprehensive MRSA colonization surveillance program. It is not intended to diagnose MRSA infection nor to guide or monitor treatment for MRSA infections.    Aerobic Culture (superficial specimen)     Status: None (Preliminary result)   Collection Time: 10/11/16  4:03 PM  Result Value Ref Range Status   Specimen Description LEG  Final   Special Requests Normal  Final   Gram Stain NO WBC SEEN NO ORGANISMS SEEN   Final   Culture   Final    NO GROWTH < 24 HOURS Performed at [facility]    Report Status PENDING  Incomplete    Coagulation Studies: No results for input(s): LABPROT, INR in the last 72 hours.  Urinalysis: No results for input(s): COLORURINE, LABSPEC, PHURINE, GLUCOSEU, HGBUR, BILIRUBINUR, KETONESUR, PROTEINUR, UROBILINOGEN, NITRITE, LEUKOCYTESUR in the last 72 hours.  Invalid input(s): APPERANCEUR    Imaging: No results found.   Medications:    . asenapine  10 mg Sublingual BID  . brimonidine  1 drop Both Eyes Q12H   And  . timolol  1 drop Both Eyes Q12H  .  ceFAZolin (ANCEF) IV  1 g Intravenous q1800  . docusate sodium  100 mg Oral BID  . feeding supplement (NEPRO CARB STEADY)  237 mL Oral BID BM  . FLUoxetine  40 mg Oral Daily  . latanoprost  1 drop Both Eyes QHS  . levothyroxine  50 mcg Oral QAC breakfast  . multivitamin with minerals  1 tablet Oral Daily  . pyridOXINE  100 mg Oral BID  . silver sulfADIAZINE   Topical BID  . simvastatin  20 mg Oral QHS  . traZODone  100 mg Oral QHS  . tuberculin  5 Units  Intradermal Once   sodium chloride, sodium chloride, acetaminophen, alteplase, haloperidol lactate, heparin, lidocaine (PF), lidocaine-prilocaine, ondansetron **OR** ondansetron (ZOFRAN) IV, oxyCODONE, pentafluoroprop-tetrafluoroeth  Assessment/ Plan:  60 y.o. white male with past medication history of anemia, bipolar disorder, degenerative disc disease, GERD, hyperlipidemia, hypertension, paranoid schizophrenia, who was admitted to [facility] on 10/04/2016 for evaluation of left leg swelling and redness.   1.  Acute renal failure on chronic kidney disease stage III: baseline creatinine of 2.1 EGFR 32  with proteinuria Acute renal failure secondary to sepsis, hypotension and ATN Requiring renal replacement. Two dialysis treatments 11/11 and 11/12.  Nonoliguric urine output. However creatinine continues to rise Chronic kidney disease secondary to hypertension  - Hemodialysis today due to hyperkalemia, and metabolic acidosis. Orders prepared. No UF.   2.  Hypotension: blood pressure has improved. Secondary to sepsis.  Takes labetalol at home.   3.  Left lower extremity cellulitis: with sepsis. E coli from blood cultures 11/6  Leukocytosis improving. Afebrile - cefazolin  - repeat blood cultures - Appreciate vascular input.   4. Anemia and thrombocytopenia: hemoglobin 8.1. Platelets improved off valproic acid Hematology consulted on 11/9   LOS: [place], [place] 11/15/20171:07 PM

## 2016-10-13 NOTE — Progress Notes (Signed)
[place] INFECTIOUS DISEASE PROGRESS NOTE Date of Admission:  10/04/2016     ID: Brandon Maynard. is a 60 y.o. male with  E coli sepsis and LE cellulitis Principal Problem:   Acute delirium Active Problems:   Schizoaffective disorder (HCC)   Thrombocytopenia (HCC)   Left leg cellulitis   Subjective: No fevers, trying to elevate leg.   ROS  Eleven systems are reviewed and negative except per hpi  Medications:  Antibiotics Given (last 72 hours)    Date/Time Action Medication Dose Rate   10/10/16 1739 Given   meropenem (MERREM) 500 mg in sodium chloride 0.9 % 50 mL IVPB 500 mg 100 mL/hr   10/10/16 1739 Given   vancomycin (VANCOCIN) 2,000 mg in sodium chloride 0.9 % 500 mL IVPB 2,000 mg 250 mL/hr   10/11/16 1935 Given   ceFAZolin (ANCEF) IVPB 1 g/50 mL premix 1 g 100 mL/hr   10/12/16 1800 Given   ceFAZolin (ANCEF) IVPB 1 g/50 mL premix 1 g 100 mL/hr     . asenapine  10 mg Sublingual BID  . brimonidine  1 drop Both Eyes Q12H   And  . timolol  1 drop Both Eyes Q12H  .  ceFAZolin (ANCEF) IV  1 g Intravenous q1800  . docusate sodium  100 mg Oral BID  . feeding supplement (NEPRO CARB STEADY)  237 mL Oral BID BM  . FLUoxetine  40 mg Oral Daily  . latanoprost  1 drop Both Eyes QHS  . levothyroxine  50 mcg Oral QAC breakfast  . multivitamin with minerals  1 tablet Oral Daily  . pyridOXINE  100 mg Oral BID  . silver sulfADIAZINE   Topical BID  . simvastatin  20 mg Oral QHS  . traZODone  100 mg Oral QHS  . tuberculin  5 Units Intradermal Once    Objective: Vital signs in last 24 hours: Temp:  [98.2 F (36.8 C)-99.3 F (37.4 C)] 98.2 F (36.8 C) (11/15 1350) Pulse Rate:  [67-91] 91 (11/15 1500) Resp:  [15-18] 15 (11/15 1500) BP: (106-134)/(59-81) 110/64 (11/15 1500) SpO2:  [93 %-100 %] 93 % (11/15 1500) Weight:  [91.7 kg (202 lb 2.6 oz)] 91.7 kg (202 lb 2.6 oz) (11/15 1350) Constitutional: He is oriented to person, place, and time. Dishelved, chronically ill  appearing HENT:  Mouth/Throat: Oropharynx is clear and moist. No oropharyngeal exudate.  Cardiovascular: Normal rate, regular rhythm and normal heart sounds. Exam reveals no gallop and no friction rub.  No murmur heard.  Pulmonary/Chest: Effort normal and breath sounds normal. No respiratory distress. He has no wheezes.  Abdominal: Soft. Bowel sounds are normal. He exhibits no distension. There is no tenderness.  Lymphadenopathy:  He has no cervical adenopathy.  Neurological: He is alert and oriented to person, place, and time.  LLE 2 + edema Skin: L leg with diffuse erythema to knee, receding now from line drawn previously. large buallea on dorsum of foot, some yellow drainage. Mod ttp, extensive ecchymosis  Lab Results  Recent Labs  10/11/16 0809  10/12/16 0425 10/13/16 0554 10/13/16 1400  WBC 12.9*  --   --   --  13.1*  HGB 8.1*  --   --   --  7.6*  HCT 23.8*  --   --   --  23.7*  NA  --   < > 144 147*  --   K  --   < > 4.9 5.3*  --   CL  --   < >  114* 118*  --   CO2  --   < > 22 21*  --   BUN  --   < > 74* 76*  --   CREATININE  --   < > 4.26* 4.63*  --   < > = values in this interval not displayed.  Microbiology: Results for orders placed or performed during the hospital encounter of 10/04/16  Blood Culture (routine x 2)     Status: Abnormal   Collection Time: 10/04/16  3:51 PM  Result Value Ref Range Status   Specimen Description BLOOD RT [facility]  Final   Special Requests BOTTLES DRAWN AEROBIC AND ANAEROBIC 10CC  Final   Culture  Setup Time   Final    Organism ID to follow GRAM NEGATIVE RODS IN BOTH AEROBIC AND ANAEROBIC BOTTLES CRITICAL RESULT CALLED TO, READ BACK BY AND VERIFIED WITH: NATE COOKSON ON 10/05/16 AT 0448 BY TLB CONFIRMED BY TLB    Culture ESCHERICHIA COLI (A)  Final   Report Status 10/07/2016 FINAL  Final   Organism ID, Bacteria ESCHERICHIA COLI  Final      Susceptibility   Escherichia coli - MIC*    AMPICILLIN >=32 RESISTANT Resistant     CEFAZOLIN  <=4 SENSITIVE Sensitive     CEFEPIME <=1 SENSITIVE Sensitive     CEFTAZIDIME <=1 SENSITIVE Sensitive     CEFTRIAXONE <=1 SENSITIVE Sensitive     CIPROFLOXACIN <=0.25 SENSITIVE Sensitive     GENTAMICIN <=1 SENSITIVE Sensitive     IMIPENEM <=0.25 SENSITIVE Sensitive     TRIMETH/SULFA >=320 RESISTANT Resistant     AMPICILLIN/SULBACTAM 16 INTERMEDIATE Intermediate     PIP/TAZO <=4 SENSITIVE Sensitive     Extended ESBL NEGATIVE Sensitive     * ESCHERICHIA COLI  Blood Culture ID Panel (Reflexed)     Status: Abnormal   Collection Time: 10/04/16  3:51 PM  Result Value Ref Range Status   Enterococcus species NOT DETECTED NOT DETECTED Final   Listeria monocytogenes NOT DETECTED NOT DETECTED Final   Staphylococcus species NOT DETECTED NOT DETECTED Final   Staphylococcus aureus NOT DETECTED NOT DETECTED Final   Streptococcus species NOT DETECTED NOT DETECTED Final   Streptococcus agalactiae NOT DETECTED NOT DETECTED Final   Streptococcus pneumoniae NOT DETECTED NOT DETECTED Final   Streptococcus pyogenes NOT DETECTED NOT DETECTED Final   Acinetobacter baumannii NOT DETECTED NOT DETECTED Final   Enterobacteriaceae species DETECTED (A) NOT DETECTED Final    Comment: CRITICAL RESULT CALLED TO, READ BACK BY AND VERIFIED WITH: NATE COOKSON ON 10/05/16 AT 0448 BY TLB    Enterobacter cloacae complex NOT DETECTED NOT DETECTED Final   Escherichia coli DETECTED (A) NOT DETECTED Final    Comment: CRITICAL RESULT CALLED TO, READ BACK BY AND VERIFIED WITH: NATE COOKSON ON 10/05/16 AT 0448 BY TLB                                                                                                Klebsiella oxytoca NOT DETECTED NOT DETECTED Final   Klebsiella pneumoniae NOT DETECTED NOT DETECTED Final   Proteus species NOT DETECTED NOT  DETECTED Final   Serratia marcescens NOT DETECTED NOT DETECTED Final   Carbapenem resistance NOT DETECTED NOT DETECTED Final   Haemophilus influenzae NOT DETECTED NOT DETECTED  Final   Neisseria meningitidis NOT DETECTED NOT DETECTED Final   Pseudomonas aeruginosa NOT DETECTED NOT DETECTED Final   Candida albicans NOT DETECTED NOT DETECTED Final   Candida glabrata NOT DETECTED NOT DETECTED Final   Candida krusei NOT DETECTED NOT DETECTED Final   Candida parapsilosis NOT DETECTED NOT DETECTED Final   Candida tropicalis NOT DETECTED NOT DETECTED Final  Blood Culture (routine x 2)     Status: Abnormal   Collection Time: 10/04/16  3:56 PM  Result Value Ref Range Status   Specimen Description BLOOD LT [facility]  Final   Special Requests BOTTLES DRAWN AEROBIC AND ANAEROBIC 10CC  Final   Culture  Setup Time   Final    GRAM NEGATIVE RODS IN BOTH AEROBIC AND ANAEROBIC BOTTLES CRITICAL RESULT CALLED TO, READ BACK BY AND VERIFIED WITH: NATE COOKSON ON 10/05/16 AT 0448 BY TLB CONFIRMED BY TLB    Culture ESCHERICHIA COLI (A)  Final   Report Status 10/07/2016 FINAL  Final  Urine culture     Status: Abnormal   Collection Time: 10/04/16  9:01 PM  Result Value Ref Range Status   Specimen Description URINE, RANDOM  Final   Special Requests NONE  Final   Culture 30,000 COLONIES/mL ESCHERICHIA COLI (A)  Final   Report Status 10/07/2016 FINAL  Final   Organism ID, Bacteria ESCHERICHIA COLI (A)  Final      Susceptibility   Escherichia coli - MIC*    AMPICILLIN >=32 RESISTANT Resistant     CEFAZOLIN <=4 SENSITIVE Sensitive     CEFTRIAXONE <=1 SENSITIVE Sensitive     CIPROFLOXACIN <=0.25 SENSITIVE Sensitive     GENTAMICIN <=1 SENSITIVE Sensitive     IMIPENEM <=0.25 SENSITIVE Sensitive     NITROFURANTOIN <=16 SENSITIVE Sensitive     TRIMETH/SULFA >=320 RESISTANT Resistant     AMPICILLIN/SULBACTAM 16 INTERMEDIATE Intermediate     PIP/TAZO <=4 SENSITIVE Sensitive     Extended ESBL NEGATIVE Sensitive     * 30,000 COLONIES/mL ESCHERICHIA COLI  MRSA PCR Screening     Status: None   Collection Time: 10/04/16  9:01 PM  Result Value Ref Range Status   MRSA by PCR NEGATIVE NEGATIVE  Final    Comment:        The GeneXpert MRSA Assay (FDA approved for NASAL specimens only), is one component of a comprehensive MRSA colonization surveillance program. It is not intended to diagnose MRSA infection nor to guide or monitor treatment for MRSA infections.   MRSA PCR Screening     Status: None   Collection Time: 10/06/16 11:59 AM  Result Value Ref Range Status   MRSA by PCR NEGATIVE NEGATIVE Final    Comment:        The GeneXpert MRSA Assay (FDA approved for NASAL specimens only), is one component of a comprehensive MRSA colonization surveillance program. It is not intended to diagnose MRSA infection nor to guide or monitor treatment for MRSA infections.   Aerobic Culture (superficial specimen)     Status: None (Preliminary result)   Collection Time: 10/11/16  4:03 PM  Result Value Ref Range Status   Specimen Description LEG  Final   Special Requests Normal  Final   Gram Stain NO WBC SEEN NO ORGANISMS SEEN   Final   Culture   Final    NO GROWTH  2 DAYS Performed at [facility]    Report Status PENDING  Incomplete    Studies/Results: No results found.  Assessment/Plan: Brandon Maynard. is a 60 y.o. male with multiple medical problems admitted with severe bullous LLE cellulitis, ARF and E coli bacteremia with E coli UCX as well. His wbc markedly increased initially but now down to 12.9.  He reports he has had MRSA in past but I cannot find any records indicating such.  He is now on HD. Seems to be receding from line drawn and with less edema Initially on meropenem and vanco but then changed to keflex but it appeared to worsen although no fever and wbc nml now.  I suspect some worsening may be due to him not elevating and issues with compliance due to psych issue.  Recommendations cx pending Cont  ancef - this should could strep, staph and the E coli in his blood Instructed pt to elevate on 3-4 pillows Will follow Thank you very much for the  consult. Will follow with you.  [place], DAVID P   10/13/2016, 3:16 PM

## 2016-10-13 NOTE — Progress Notes (Signed)
HD completed without issue. 0UF as ordered

## 2016-10-13 NOTE — Consult Note (Signed)
Pharmacy Antibiotic Note  Brandon Maynard. is a 60 y.o. male admitted on 10/04/2016 with bacteremia, cellulitis and UTI.  Pharmacy has been consulted for vancomycin and meropenem dosing. Patient was treated with one dose of vanc and zosyn on 11/6. meropenem from 11/7-11/9 and keflex from 11/1- present. Patient has ecoli bacteremia and UTI (54, 000 colonies). Pt has left lower extremity swelling/ cellulits, erythema with an open blister that is worsening. Pharmacy consulted to broaden abx. Patient has now been transitioned to HD due to worsening renal function - first HD session 11/11; second HD session 11/12, HD on hold for 11/13 and 11/14.   ID narrowing abx to cefazolin.   Plan: Continue cefazolin 1 g iv q 24 hours.  Per pt's RN, planning on HD today.    Height: 6\' 3"  (190.5 cm) Weight: 189 lb 13.1 oz (86.1 kg) IBW/kg (Calculated) : 84.5  Temp (24hrs), Avg:98.7 F (37.1 C), Min:98.2 F (36.8 C), Max:99.3 F (37.4 C)   Recent Labs Lab 10/07/16 0713 10/08/16 0721 10/09/16 1048 10/10/16 1455 10/11/16 0809 10/11/16 1149 10/12/16 0425 10/13/16 0554  WBC 24.5* 24.3*  --   --  12.9*  --   --   --   CREATININE 5.11* 5.07* 5.47* 4.81*  --  3.96* 4.26* 4.63*    Estimated Creatinine Clearance: 19.8 mL/min (by C-G formula based on SCr of 4.63 mg/dL (H)).    Allergies  Allergen Reactions  . Tetracyclines & Related Hives    Antimicrobials this admission: vancomycin 11/6 >> 11/6, 11/12>> 11/13 zosyn 11/6 >> 11/6 Meropenem 11/7>> 11/9, 11/12>>11/13 CTX 11/9 >> 11/10 Keflex 11/10>>11/12 Cefazolin 11/13 >>  Dose adjustments this admission:   Microbiology results: 11/15 BCx x2 sent 11/13 WCx: NGTD 11/6 BCx: ecoli x2 11/6 UCx: 30,000 EColi 11/6 MRSA PCR: neg  Thank you for allowing pharmacy to be a part of this patient's care.  Rayna Sexton L 10/13/2016 1:00 PM

## 2016-10-14 ENCOUNTER — Encounter
Admission: EM | Disposition: A | Discharge status: Home Health | Payer: Self-pay | Source: Home / Self Care | Attending: Internal Medicine

## 2016-10-14 DIAGNOSIS — N186 End stage renal disease: Secondary | ICD-10-CM

## 2016-10-14 HISTORY — PX: PERIPHERAL VASCULAR CATHETERIZATION: SHX172C

## 2016-10-14 LAB — BASIC METABOLIC PANEL
Anion gap: 8 (ref 5–15)
BUN: 55 mg/dL — AB (ref 6–20)
CO2: 25 mmol/L (ref 22–32)
Calcium: 7.4 mg/dL — ABNORMAL LOW (ref 8.9–10.3)
Chloride: 108 mmol/L (ref 101–111)
Creatinine, Ser: 3.72 mg/dL — ABNORMAL HIGH (ref 0.61–1.24)
GFR calc Af Amer: 19 mL/min — ABNORMAL LOW (ref >60)
GFR, EST NON AFRICAN AMERICAN: 16 mL/min — AB (ref >60)
GLUCOSE: 111 mg/dL — AB (ref 65–99)
POTASSIUM: 5 mmol/L (ref 3.5–5.1)
Sodium: 141 mmol/L (ref 135–145)

## 2016-10-14 LAB — CBC
HEMATOCRIT: 18.7 % — AB (ref 40.0–52.0)
Hemoglobin: 6.3 g/dL — ABNORMAL LOW (ref 13.0–18.0)
MCH: 30.8 pg (ref 26.0–34.0)
MCHC: 33.7 g/dL (ref 32.0–36.0)
MCV: 91.6 fL (ref 80.0–100.0)
Platelets: 110 10*3/uL — ABNORMAL LOW (ref 150–440)
RBC: 2.05 MIL/uL — ABNORMAL LOW (ref 4.40–5.90)
RDW: 14 % (ref 11.5–14.5)
WBC: 10.9 10*3/uL — ABNORMAL HIGH (ref 3.8–10.6)

## 2016-10-14 LAB — PREPARE RBC (CROSSMATCH)

## 2016-10-14 LAB — AEROBIC CULTURE  (SUPERFICIAL SPECIMEN)
CULTURE: NO GROWTH
GRAM STAIN: NONE SEEN

## 2016-10-14 LAB — AEROBIC CULTURE W GRAM STAIN (SUPERFICIAL SPECIMEN): Special Requests: NORMAL

## 2016-10-14 LAB — HEMOGLOBIN AND HEMATOCRIT, BLOOD
HCT: 24.9 % — ABNORMAL LOW (ref 40.0–52.0)
Hemoglobin: 8.3 g/dL — ABNORMAL LOW (ref 13.0–18.0)

## 2016-10-14 SURGERY — DIALYSIS/PERMA CATHETER INSERTION
Anesthesia: Moderate Sedation

## 2016-10-14 MED ORDER — HEPARIN (PORCINE) IN NACL 2-0.9 UNIT/ML-% IJ SOLN
INTRAMUSCULAR | Status: AC
  Filled 2016-10-14: qty 500

## 2016-10-14 MED ORDER — MIDAZOLAM HCL 2 MG/2ML IJ SOLN
INTRAMUSCULAR | Status: DC | PRN
  Administered 2016-10-14 (×2): 2 mg via INTRAVENOUS

## 2016-10-14 MED ORDER — DEXTROSE 5 % IV SOLN
INTRAVENOUS | Status: AC
  Filled 2016-10-14: qty 1.5

## 2016-10-14 MED ORDER — FENTANYL CITRATE (PF) 100 MCG/2ML IJ SOLN
INTRAMUSCULAR | Status: DC | PRN
  Administered 2016-10-14 (×2): 50 ug via INTRAVENOUS

## 2016-10-14 MED ORDER — SODIUM CHLORIDE 0.9 % IV SOLN
Freq: Once | INTRAVENOUS | Status: AC
  Administered 2016-10-14: 12:00:00 via INTRAVENOUS

## 2016-10-14 MED ORDER — ONDANSETRON HCL 4 MG/2ML IJ SOLN
INTRAMUSCULAR | Status: AC
  Filled 2016-10-14: qty 2

## 2016-10-14 MED ORDER — SODIUM CHLORIDE 0.9 % IV SOLN
INTRAVENOUS | Status: DC
  Administered 2016-10-14: 16:00:00 via INTRAVENOUS

## 2016-10-14 MED ORDER — DEXTROSE 5 % IV SOLN
1.5000 g | Freq: Three times a day (TID) | INTRAVENOUS | Status: DC
  Administered 2016-10-14 – 2016-10-16 (×6): 1.5 g via INTRAVENOUS
  Filled 2016-10-14 (×8): qty 1.5

## 2016-10-14 MED ORDER — FENTANYL CITRATE (PF) 100 MCG/2ML IJ SOLN
INTRAMUSCULAR | Status: AC
  Filled 2016-10-14: qty 2

## 2016-10-14 MED ORDER — LIDOCAINE-EPINEPHRINE (PF) 1 %-1:200000 IJ SOLN
INTRAMUSCULAR | Status: AC
  Filled 2016-10-14: qty 30

## 2016-10-14 MED ORDER — MIDAZOLAM HCL 2 MG/2ML IJ SOLN
INTRAMUSCULAR | Status: AC
  Filled 2016-10-14: qty 4

## 2016-10-14 MED ORDER — HEPARIN SODIUM (PORCINE) 10000 UNIT/ML IJ SOLN
INTRAMUSCULAR | Status: AC
  Filled 2016-10-14: qty 1

## 2016-10-14 SURGICAL SUPPLY — 7 items
BIOPATCH WHT 1IN DISK W/4.0 H (GAUZE/BANDAGES/DRESSINGS) ×2 IMPLANT
CATH PALINDROME RT-P 15FX19CM (CATHETERS) ×3 IMPLANT
PACK ANGIOGRAPHY (CUSTOM PROCEDURE TRAY) ×2 IMPLANT
STOPCOCK PLUG RED CAP (CAP) ×4 IMPLANT
SUT MNCRL AB 4-0 PS2 18 (SUTURE) ×2 IMPLANT
SUT PROLENE 0 CT 1 30 (SUTURE) ×3 IMPLANT
TOWEL OR 17X26 4PK STRL BLUE (TOWEL DISPOSABLE) ×3 IMPLANT

## 2016-10-14 NOTE — Progress Notes (Signed)
[facility]  ROUNDING NOTE   Subjective:   Hemodialysis yesterday. Tolerated treatment. UF 0.   hgb 6.3 - PRBC ordered  Objective:  Vital signs in last 24 hours:  Temp:  [98 F (36.7 C)-99.1 F (37.3 C)] 99.1 F (37.3 C) (11/16 0359) Pulse Rate:  [70-91] 81 (11/16 0359) Resp:  [15-18] 18 (11/16 0359) BP: (102-134)/(59-81) 102/59 (11/16 0359) SpO2:  [93 %-100 %] 100 % (11/16 0359) Weight:  [91.7 kg (202 lb 2.6 oz)] 91.7 kg (202 lb 2.6 oz) (11/15 1705)  Weight change:  Filed Weights   10/10/16 1654 10/13/16 1350 10/13/16 1705  Weight: 86.1 kg (189 lb 13.1 oz) 91.7 kg (202 lb 2.6 oz) 91.7 kg (202 lb 2.6 oz)    Intake/Output: I/O last 3 completed shifts: In: 2100 [P.O.:2100] Out: 4700 [Urine:4700]   Intake/Output this shift:  Total I/O In: 240 [P.O.:240] Out: -   Physical Exam: General: Laying in bed  Head: Normocephalic, atraumatic. Moist oral mucosal membranes  Eyes: Anicteric  Neck: Supple, trachea midline  Lungs:  Clear to auscultation, normal effort  Heart: S1S2 no rubs  Abdomen:  Soft, nontender, BS present  Extremities: 1+ edema left lower extremity, +erythema and clean dressings.   Neurologic: Awake, follows commands  Skin: Left foot induration  Access:  Right femoral temp HD catheter, 11/11 Dr. Delana Meyer    Basic Metabolic Panel:  Recent Labs Lab 10/09/16 1708 10/10/16 1455 10/11/16 1149 10/12/16 0425 10/13/16 0554 10/14/16 0604  NA  --  138 143 144 147* 141  K  --  4.5 4.4 4.9 5.3* 5.0  CL  --  109 111 114* 118* 108  CO2  --  21* 24 22 21* 25  GLUCOSE  --  116* 113* 103* 98 111*  BUN  --  93* 70* 74* 76* 55*  CREATININE  --  4.81* 3.96* 4.26* 4.63* 3.72*  CALCIUM  --  7.6* 7.7* 7.8* 7.9* 7.4*  PHOS 5.0* 5.6* 5.7* 5.7* 6.8*  --     Liver Function Tests:  Recent Labs Lab 10/08/16 0721 10/10/16 1455 10/11/16 1149 10/12/16 0425 10/13/16 0554  ALBUMIN 1.5* 1.4* 1.3* 1.3* 1.4*   No results for input(s): LIPASE, AMYLASE in  the last 168 hours. No results for input(s): AMMONIA in the last 168 hours.  CBC:  Recent Labs Lab 10/08/16 0721 10/11/16 0809 10/13/16 1400 10/14/16 0604  WBC 24.3* 12.9* 13.1* 10.9*  NEUTROABS 20.1* 10.1*  --   --   HGB 9.7* 8.1* 7.6* 6.3*  HCT 29.4* 23.8* 23.7* 18.7*  MCV 92.7 88.8 92.9 91.6  PLT 40* 72* 136* 110*    Cardiac Enzymes: No results for input(s): CKTOTAL, CKMB, CKMBINDEX, TROPONINI in the last 168 hours.  BNP: Invalid input(s): POCBNP  CBG: No results for input(s): GLUCAP in the last 168 hours.  Microbiology: Results for orders placed or performed during the hospital encounter of 10/04/16  Blood Culture (routine x 2)     Status: Abnormal   Collection Time: 10/04/16  3:51 PM  Result Value Ref Range Status   Specimen Description BLOOD RT [facility]  Final   Special Requests BOTTLES DRAWN AEROBIC AND ANAEROBIC 10CC  Final   Culture  Setup Time   Final    Organism ID to follow GRAM NEGATIVE RODS IN BOTH AEROBIC AND ANAEROBIC BOTTLES CRITICAL RESULT CALLED TO, READ BACK BY AND VERIFIED WITH: NATE COOKSON ON 10/05/16 AT 0448 BY TLB CONFIRMED BY TLB    Culture ESCHERICHIA COLI (A)  Final  Report Status 10/07/2016 FINAL  Final   Organism ID, Bacteria ESCHERICHIA COLI  Final      Susceptibility   Escherichia coli - MIC*    AMPICILLIN >=32 RESISTANT Resistant     CEFAZOLIN <=4 SENSITIVE Sensitive     CEFEPIME <=1 SENSITIVE Sensitive     CEFTAZIDIME <=1 SENSITIVE Sensitive     CEFTRIAXONE <=1 SENSITIVE Sensitive     CIPROFLOXACIN <=0.25 SENSITIVE Sensitive     GENTAMICIN <=1 SENSITIVE Sensitive     IMIPENEM <=0.25 SENSITIVE Sensitive     TRIMETH/SULFA >=320 RESISTANT Resistant     AMPICILLIN/SULBACTAM 16 INTERMEDIATE Intermediate     PIP/TAZO <=4 SENSITIVE Sensitive     Extended ESBL NEGATIVE Sensitive     * ESCHERICHIA COLI  Blood Culture ID Panel (Reflexed)     Status: Abnormal   Collection Time: 10/04/16  3:51 PM  Result Value Ref Range Status    Enterococcus species NOT DETECTED NOT DETECTED Final   Listeria monocytogenes NOT DETECTED NOT DETECTED Final   Staphylococcus species NOT DETECTED NOT DETECTED Final   Staphylococcus aureus NOT DETECTED NOT DETECTED Final   Streptococcus species NOT DETECTED NOT DETECTED Final   Streptococcus agalactiae NOT DETECTED NOT DETECTED Final   Streptococcus pneumoniae NOT DETECTED NOT DETECTED Final   Streptococcus pyogenes NOT DETECTED NOT DETECTED Final   Acinetobacter baumannii NOT DETECTED NOT DETECTED Final   Enterobacteriaceae species DETECTED (A) NOT DETECTED Final    Comment: CRITICAL RESULT CALLED TO, READ BACK BY AND VERIFIED WITH: NATE COOKSON ON 10/05/16 AT 0448 BY TLB    Enterobacter cloacae complex NOT DETECTED NOT DETECTED Final   Escherichia coli DETECTED (A) NOT DETECTED Final    Comment: CRITICAL RESULT CALLED TO, READ BACK BY AND VERIFIED WITH: NATE COOKSON ON 10/05/16 AT 0448 BY TLB                                                                                                Klebsiella oxytoca NOT DETECTED NOT DETECTED Final   Klebsiella pneumoniae NOT DETECTED NOT DETECTED Final   Proteus species NOT DETECTED NOT DETECTED Final   Serratia marcescens NOT DETECTED NOT DETECTED Final   Carbapenem resistance NOT DETECTED NOT DETECTED Final   Haemophilus influenzae NOT DETECTED NOT DETECTED Final   Neisseria meningitidis NOT DETECTED NOT DETECTED Final   Pseudomonas aeruginosa NOT DETECTED NOT DETECTED Final   Candida albicans NOT DETECTED NOT DETECTED Final   Candida glabrata NOT DETECTED NOT DETECTED Final   Candida krusei NOT DETECTED NOT DETECTED Final   Candida parapsilosis NOT DETECTED NOT DETECTED Final   Candida tropicalis NOT DETECTED NOT DETECTED Final  Blood Culture (routine x 2)     Status: Abnormal   Collection Time: 10/04/16  3:56 PM  Result Value Ref Range Status   Specimen Description BLOOD LT [facility]  Final   Special Requests BOTTLES DRAWN AEROBIC AND ANAEROBIC  10CC  Final   Culture  Setup Time   Final    GRAM NEGATIVE RODS IN BOTH AEROBIC AND ANAEROBIC BOTTLES CRITICAL RESULT CALLED TO, READ BACK BY AND VERIFIED WITH: NATE  COOKSON ON 10/05/16 AT 0448 BY TLB CONFIRMED BY TLB    Culture ESCHERICHIA COLI (A)  Final   Report Status 10/07/2016 FINAL  Final  Urine culture     Status: Abnormal   Collection Time: 10/04/16  9:01 PM  Result Value Ref Range Status   Specimen Description URINE, RANDOM  Final   Special Requests NONE  Final   Culture 30,000 COLONIES/mL ESCHERICHIA COLI (A)  Final   Report Status 10/07/2016 FINAL  Final   Organism ID, Bacteria ESCHERICHIA COLI (A)  Final      Susceptibility   Escherichia coli - MIC*    AMPICILLIN >=32 RESISTANT Resistant     CEFAZOLIN <=4 SENSITIVE Sensitive     CEFTRIAXONE <=1 SENSITIVE Sensitive     CIPROFLOXACIN <=0.25 SENSITIVE Sensitive     GENTAMICIN <=1 SENSITIVE Sensitive     IMIPENEM <=0.25 SENSITIVE Sensitive     NITROFURANTOIN <=16 SENSITIVE Sensitive     TRIMETH/SULFA >=320 RESISTANT Resistant     AMPICILLIN/SULBACTAM 16 INTERMEDIATE Intermediate     PIP/TAZO <=4 SENSITIVE Sensitive     Extended ESBL NEGATIVE Sensitive     * 30,000 COLONIES/mL ESCHERICHIA COLI  MRSA PCR Screening     Status: None   Collection Time: 10/04/16  9:01 PM  Result Value Ref Range Status   MRSA by PCR NEGATIVE NEGATIVE Final    Comment:        The GeneXpert MRSA Assay (FDA approved for NASAL specimens only), is one component of a comprehensive MRSA colonization surveillance program. It is not intended to diagnose MRSA infection nor to guide or monitor treatment for MRSA infections.   MRSA PCR Screening     Status: None   Collection Time: 10/06/16 11:59 AM  Result Value Ref Range Status   MRSA by PCR NEGATIVE NEGATIVE Final    Comment:        The GeneXpert MRSA Assay (FDA approved for NASAL specimens only), is one component of a comprehensive MRSA colonization surveillance program. It is  not intended to diagnose MRSA infection nor to guide or monitor treatment for MRSA infections.   Aerobic Culture (superficial specimen)     Status: None   Collection Time: 10/11/16  4:03 PM  Result Value Ref Range Status   Specimen Description LEG  Final   Special Requests Normal  Final   Gram Stain NO WBC SEEN NO ORGANISMS SEEN   Final   Culture   Final    NO GROWTH 3 DAYS Performed at [facility]    Report Status 10/14/2016 FINAL  Final  CULTURE, BLOOD (ROUTINE X 2) w Reflex to ID Panel     Status: None (Preliminary result)   Collection Time: 10/13/16  9:33 AM  Result Value Ref Range Status   Specimen Description BLOOD RIGHT HAND  Final   Special Requests   Final    BOTTLES DRAWN AEROBIC AND ANAEROBIC [place]   Culture NO GROWTH < 24 HOURS  Final   Report Status PENDING  Incomplete  CULTURE, BLOOD (ROUTINE X 2) w Reflex to ID Panel     Status: None (Preliminary result)   Collection Time: 10/13/16  9:40 AM  Result Value Ref Range Status   Specimen Description BLOOD LEFT ASSIST CONTROL  Final   Special Requests   Final    BOTTLES DRAWN AEROBIC AND ANAEROBIC [place]   Culture NO GROWTH < 24 HOURS  Final   Report Status PENDING  Incomplete    Coagulation Studies: No results  for input(s): LABPROT, INR in the last 72 hours.  Urinalysis: No results for input(s): COLORURINE, LABSPEC, PHURINE, GLUCOSEU, HGBUR, BILIRUBINUR, KETONESUR, PROTEINUR, UROBILINOGEN, NITRITE, LEUKOCYTESUR in the last 72 hours.  Invalid input(s): APPERANCEUR    Imaging: No results found.   Medications:    . sodium chloride   Intravenous Once  . asenapine  10 mg Sublingual BID  . brimonidine  1 drop Both Eyes Q12H   And  . timolol  1 drop Both Eyes Q12H  .  ceFAZolin (ANCEF) IV  1 g Intravenous q1800  . docusate sodium  100 mg Oral BID  . feeding supplement (NEPRO CARB STEADY)  237 mL Oral BID BM  . FLUoxetine  40 mg Oral Daily  . latanoprost  1 drop Both Eyes QHS   . levothyroxine  50 mcg Oral QAC breakfast  . multivitamin with minerals  1 tablet Oral Daily  . pyridOXINE  100 mg Oral BID  . silver sulfADIAZINE   Topical BID  . simvastatin  20 mg Oral QHS  . traZODone  100 mg Oral QHS   sodium chloride, sodium chloride, acetaminophen, alteplase, haloperidol lactate, heparin, lidocaine (PF), lidocaine-prilocaine, ondansetron **OR** ondansetron (ZOFRAN) IV, oxyCODONE, pentafluoroprop-tetrafluoroeth  Assessment/ Plan:  59 y.o. white male with past medication history of anemia, bipolar disorder, degenerative disc disease, GERD, hyperlipidemia, hypertension, paranoid schizophrenia, who was admitted to [facility] on 10/04/2016 for evaluation of left leg swelling and redness.   1.  Acute renal failure on chronic kidney disease stage III: baseline creatinine of 2.1 EGFR 32 with proteinuria Acute renal failure secondary to sepsis, hypotension and ATN Requiring renal replacement. Two dialysis treatments 11/11 and 11/12.  Nonoliguric urine output. However creatinine continues to rise Chronic kidney disease secondary to hypertension  - Hemodialysis yesterday 11/15 due to hyperkalemia, and metabolic acidosis. .   2.  Hypotension: blood pressure has improved. Secondary to sepsis.  Takes labetalol at home.   3.  Left lower extremity cellulitis: with sepsis. E coli from blood cultures 11/6  Leukocytosis improving. Afebrile - cefazolin  - repeat blood cultures negative.  - Appreciate vascular input.   4. Anemia and thrombocytopenia: hemoglobin low. Platelets improved off valproic acid Hematology consulted on 11/9 - PRBC ordered.    LOS: [place], [place] 11/16/201710:33 AM

## 2016-10-14 NOTE — Consult Note (Signed)
[facility] Consult   Reason for Consult:  Follow-up consult for 60 year old man with a history of chronic mental illness currently in the hospital with acute and complicated medical problems stemming from probably sepsis from a cellulitis and labile blood pressures. Updated problem as of today he is assessing capacity for decision-making Referring Physician:  Patel/Lateef Patient Identification: Brandon T Argabright Jr. MRN:  [id number] Principal Diagnosis: Acute delirium Diagnosis:   Patient Active Problem List   Diagnosis Date Noted  . Acute delirium [R41.0] 10/05/2016  . Left leg cellulitis [W09.811] 10/04/2016  . B12 deficiency [E53.8] 07/06/2016  . Anemia [D64.9] 06/16/2016  . Thrombocytopenia ([place]) [D69.6] 06/16/2016  . Weight loss [R63.4] 06/16/2016  . Esophageal reflux [K21.9] 05/12/2016  . 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 Schizoaffective disorder ([place]) [F25.9] 06/13/2014    Total Time spent with patient: 20 minutes  Subjective:   Brandon Malay. is a 60 y.o. male patient admitted with "I'm not feeling good".  HPI: Follow-up for Thursday the 16th. No new complaints. He had a dialysis catheter placed today. He is awake alert and very appropriate. Speech is fairly lucid. No sign of agitation or mania.  Past Psychiatric History: Chronic schizophrenia or schizoaffective disorder. Recently we have discontinued his Depakote out of concern for it worsening his thrombocytopenia and medical problems. He is still on his antipsychotic.  Risk to Self: Is patient at risk for suicide?: No Risk to Others:   Prior Inpatient Therapy:   Prior Outpatient Therapy:    Past Medical History:  Past Medical History:  Diagnosis Date  . Anemia   . Bipolar disorder ([place])   . Cancer ([place])   . Cataract    right eye  . DDD (degenerative disc disease)   . GERD (gastroesophageal reflux disease)   . Heart murmur   .  Hyperlipidemia   . Hypertension   . Paranoid schizophrenia ([place])   . 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 Thyroid disease     Past Surgical History:  Procedure Laterality Date  . Dermal Abrasion  1973   Family History:  Family History  Problem Relation Age of Onset  . Brain cancer Maternal Uncle   . Breast cancer Mother   . Melanoma Mother   . Congestive Heart Failure Father   . Melanoma Father   . Breast cancer Maternal Aunt   . Diabetes Maternal Aunt    Family Psychiatric  History: No family history identified. Social History:  History  Alcohol Use No     History  Drug Use No    Social History   Social History  . Marital status: Single    Spouse name: N/A  . Number of children: N/A  . Years of education: N/A   Social History Main Topics  . Smoking status: Current Every Day Smoker    Last attempt to quit: 04/09/1991  . Smokeless tobacco: Never Used  . Alcohol use No  . Drug use: No  . Sexual activity: Not Asked   Other Topics Concern  . None   Social History Narrative  . None   Additional Social History:Patient's brother, who lives in [place], is the only relative actively involved in making decisions. I am told by nephrology that the brother has been contacted and expresses agreement with proceeding with dialysis. Unfortunately he has not been made the healthcare power of attorney or guardian at this point.    Allergies:   Allergies  Allergen  Reactions  . Tetracyclines & Related Hives    Labs:  Results for orders placed or performed during the hospital encounter of 10/04/16 (from the past 48 hour(s))  Renal function panel     Status: Abnormal   Collection Time: 10/13/16  5:54 AM  Result Value Ref Range   Sodium 147 (H) 135 - 145 mmol/L   Potassium 5.3 (H) 3.5 - 5.1 mmol/L   Chloride 118 (H) 101 - 111 mmol/L   CO2 21 (L) 22 - 32 mmol/L   Glucose, Bld 98 65 - 99 mg/dL   BUN 76 (H) 6 - 20 mg/dL   Creatinine, Ser 4.63 (H) 0.61 - 1.24 mg/dL   Calcium 7.9  (L) 8.9 - 10.3 mg/dL   Phosphorus 6.8 (H) 2.5 - 4.6 mg/dL   Albumin 1.4 (L) 3.5 - 5.0 g/dL   GFR calc non Af Amer 12 (L) >60 mL/min   GFR calc Af Amer 14 (L) >60 mL/min    Comment: (NOTE) The eGFR has been calculated using the CKD EPI equation. This calculation has not been validated in all clinical situations. eGFR's persistently <60 mL/min signify possible Chronic Kidney Disease.    Anion gap 8 5 - 15  CULTURE, BLOOD (ROUTINE X 2) w Reflex to ID Panel     Status: None (Preliminary result)   Collection Time: 10/13/16  9:33 AM  Result Value Ref Range   Specimen Description BLOOD RIGHT HAND    Special Requests      BOTTLES DRAWN AEROBIC AND ANAEROBIC [place]   Culture NO GROWTH < 24 HOURS    Report Status PENDING   CULTURE, BLOOD (ROUTINE X 2) w Reflex to ID Panel     Status: None (Preliminary result)   Collection Time: 10/13/16  9:40 AM  Result Value Ref Range   Specimen Description BLOOD LEFT ASSIST CONTROL    Special Requests      BOTTLES DRAWN AEROBIC AND ANAEROBIC [place]   Culture NO GROWTH < 24 HOURS    Report Status PENDING   CBC     Status: Abnormal   Collection Time: 10/13/16  2:00 PM  Result Value Ref Range   WBC 13.1 (H) 3.8 - 10.6 K/uL   RBC 2.55 (L) 4.40 - 5.90 MIL/uL   Hemoglobin 7.6 (L) 13.0 - 18.0 g/dL   HCT 23.7 (L) 40.0 - 52.0 %   MCV 92.9 80.0 - 100.0 fL   MCH 29.9 26.0 - 34.0 pg   MCHC 32.2 32.0 - 36.0 g/dL   RDW 14.5 11.5 - 14.5 %   Platelets 136 (L) 150 - 440 K/uL  Basic metabolic panel     Status: Abnormal   Collection Time: 10/14/16  6:04 AM  Result Value Ref Range   Sodium 141 135 - 145 mmol/L   Potassium 5.0 3.5 - 5.1 mmol/L   Chloride 108 101 - 111 mmol/L   CO2 25 22 - 32 mmol/L   Glucose, Bld 111 (H) 65 - 99 mg/dL   BUN 55 (H) 6 - 20 mg/dL   Creatinine, Ser 3.72 (H) 0.61 - 1.24 mg/dL   Calcium 7.4 (L) 8.9 - 10.3 mg/dL   GFR calc non Af Amer 16 (L) >60 mL/min   GFR calc Af Amer 19 (L) >60 mL/min    Comment: (NOTE) The  eGFR has been calculated using the CKD EPI equation. This calculation has not been validated in all clinical situations. eGFR's persistently <60 mL/min signify possible Chronic Kidney Disease.    Anion  gap 8 5 - 15  CBC     Status: Abnormal   Collection Time: 10/14/16  6:04 AM  Result Value Ref Range   WBC 10.9 (H) 3.8 - 10.6 K/uL   RBC 2.05 (L) 4.40 - 5.90 MIL/uL   Hemoglobin 6.3 (L) 13.0 - 18.0 g/dL   HCT 18.7 (L) 40.0 - 52.0 %   MCV 91.6 80.0 - 100.0 fL   MCH 30.8 26.0 - 34.0 pg   MCHC 33.7 32.0 - 36.0 g/dL   RDW 14.0 11.5 - 14.5 %   Platelets 110 (L) 150 - 440 K/uL  Type and screen [facility]     Status: None (Preliminary result)   Collection Time: 10/14/16  9:15 AM  Result Value Ref Range   ABO/RH(D) O POS    Antibody Screen NEG    Sample Expiration 10/17/2016    Unit Number [id number]    Blood Component Type RED CELLS,LR    Unit division 00    Status of Unit ISSUED    Transfusion Status OK TO TRANSFUSE    Crossmatch Result Compatible   Prepare RBC     Status: None   Collection Time: 10/14/16  9:15 AM  Result Value Ref Range   Order Confirmation ORDER PROCESSED BY BLOOD BANK     Current Facility-Administered Medications  Medication Dose Route Frequency Provider Last Rate Last Dose  . 0.9 %  sodium chloride infusion  100 mL Intravenous PRN Munsoor Lateef, MD      . 0.9 %  sodium chloride infusion  100 mL Intravenous PRN Munsoor Lateef, MD      . acetaminophen (TYLENOL) tablet 650 mg  650 mg Oral Q6H PRN Fritzi Mandes, MD   650 mg at 10/14/16 1237  . alteplase (CATHFLO ACTIVASE) injection 2 mg  2 mg Intracatheter Once PRN Munsoor Lateef, MD      . asenapine (SAPHRIS) sublingual tablet 10 mg  10 mg Sublingual BID Gonzella Lex, MD   10 mg at 10/14/16 1244  . brimonidine (ALPHAGAN) 0.2 % ophthalmic solution 1 drop  1 drop Both Eyes Q12H Aruna Gouru, MD   1 drop at 10/14/16 1214   And  . timolol (TIMOPTIC) 0.5 % ophthalmic solution 1 drop  1 drop  Both Eyes Q12H Nicholes Mango, MD   1 drop at 10/14/16 1214  . ceFAZolin (ANCEF) IVPB 1 g/50 mL premix  1 g Intravenous q1800 Napoleon Form, RPH   1 g at 10/13/16 1753  . cefUROXime (ZINACEF) 1.5 g in dextrose 5 % 50 mL IVPB  1.5 g Intravenous Q8H Algernon Huxley, MD   1.5 g at 10/14/16 1600  . docusate sodium (COLACE) capsule 100 mg  100 mg Oral BID Nicholes Mango, MD   100 mg at 10/14/16 1208  . feeding supplement (NEPRO CARB STEADY) liquid 237 mL  237 mL Oral BID BM Epifanio Lesches, MD   237 mL at 10/13/16 1333  . FLUoxetine (PROZAC) capsule 40 mg  40 mg Oral Daily Nicholes Mango, MD   40 mg at 10/14/16 1208  . haloperidol lactate (HALDOL) injection 2 mg  2 mg Intravenous Q6H PRN Gonzella Lex, MD   2 mg at 10/14/16 0253  . heparin injection 1,000 Units  1,000 Units Dialysis PRN Munsoor Lateef, MD      . latanoprost (XALATAN) 0.005 % ophthalmic solution 1 drop  1 drop Both Eyes QHS Nicholes Mango, MD   1 drop at 10/13/16 2200  . levothyroxine (  SYNTHROID, LEVOTHROID) tablet 50 mcg  50 mcg Oral QAC breakfast Nicholes Mango, MD   50 mcg at 10/14/16 0851  . lidocaine (PF) (XYLOCAINE) 1 % injection 5 mL  5 mL Intradermal PRN Munsoor Lateef, MD      . lidocaine-prilocaine (EMLA) cream 1 application  1 application Topical PRN Munsoor Lateef, MD      . multivitamin with minerals tablet 1 tablet  1 tablet Oral Daily Nicholes Mango, MD   1 tablet at 10/14/16 1208  . ondansetron (ZOFRAN) tablet 4 mg  4 mg Oral Q6H PRN Nicholes Mango, MD       Or  . ondansetron (ZOFRAN) injection 4 mg  4 mg Intravenous Q6H PRN Nicholes Mango, MD   4 mg at 10/14/16 1615  . oxyCODONE (Oxy IR/ROXICODONE) immediate release tablet 5 mg  5 mg Oral Q6H PRN Fritzi Mandes, MD   5 mg at 10/10/16 2244  . pentafluoroprop-tetrafluoroeth (GEBAUERS) aerosol 1 application  1 application Topical PRN Munsoor Lateef, MD      . pyridOXINE (VITAMIN B-6) tablet 100 mg  100 mg Oral BID Nicholes Mango, MD   100 mg at 10/14/16 1208  . silver sulfADIAZINE (SILVADENE) 1 %  cream   Topical BID Fritzi Mandes, MD      . simvastatin (ZOCOR) tablet 20 mg  20 mg Oral QHS Nicholes Mango, MD   20 mg at 10/13/16 2115  . traZODone (DESYREL) tablet 100 mg  100 mg Oral QHS Nicholes Mango, MD   100 mg at 10/13/16 2200   Facility-Administered Medications Ordered in Other Encounters  Medication Dose Route Frequency Provider Last Rate Last Dose  . cyanocobalamin ((VITAMIN B-12)) injection 1,000 mcg  1,000 mcg Intramuscular Once Lequita Asal, MD        Musculoskeletal: Strength & Muscle Tone: decreased Gait & Station: unable to stand Patient leans: Backward  Psychiatric Specialty Exam: Physical Exam  Nursing note and vitals reviewed. Constitutional: He appears well-developed and well-nourished.  HENT:  Head: Normocephalic and atraumatic.  Eyes: Conjunctivae are normal. Pupils are equal, round, and reactive to light.  Neck: Normal range of motion.  Cardiovascular: Normal heart sounds.   Respiratory: No respiratory distress.  GI: Soft.  Musculoskeletal: Normal range of motion.  Neurological: He is alert.  Skin: Skin is warm and dry.     Psychiatric: His affect is labile. His speech is tangential. His speech is not delayed and not slurred. He is slowed. He is not agitated. He does not express impulsivity. He expresses no suicidal ideation. He expresses no suicidal plans. He exhibits abnormal recent memory.    Review of Systems  Constitutional: Negative.   HENT: Negative.   Eyes: Negative.   Respiratory: Negative.   Cardiovascular: Negative.   Gastrointestinal: Negative.   Musculoskeletal: Positive for joint pain and myalgias.  Skin: Negative.   Neurological: Negative.   Psychiatric/Behavioral: Negative for depression, hallucinations, memory loss, substance abuse and suicidal ideas. The patient is not nervous/anxious and does not have insomnia.     Blood pressure 116/64, pulse 64, temperature 97.6 F (36.4 C), temperature source Oral, resp. rate 15, height 6' 3"  (1.905 m), weight 91.6 kg (202 lb), SpO2 100 %.Body mass index is 25.25 kg/m.  General Appearance: Disheveled  Eye Contact:  None  Speech:  Garbled  Volume:  Decreased  Mood:  Irritable  Affect:  Full Range  Thought Process:  Goal Directed  Orientation:  Full (Time, Place, and Person)  Thought Content:  Patient has some disorganization  of his thought but he is able to be redirected and he actually was able to discuss his current condition without reference to anything that appeared to be obviously delusional.  Suicidal Thoughts:  No  Homicidal Thoughts:  No  Memory:  Immediate;   Fair Recent;   Fair Remote;   Fair  Judgement:  Impaired  Insight:  Shallow  Psychomotor Activity:  Decreased  Concentration:  Concentration: Poor  Recall:  [employer] of Knowledge:  Fair  Language:  Fair  Akathisia:  No  Handed:  Right  AIMS (if indicated):     Assets:  Communication Skills Desire for Improvement Social Support  ADL's:  Impaired  Cognition:  Impaired,  Mild  Sleep:        Treatment Plan Summary: Daily contact with patient to assess and evaluate symptoms and progress in treatment, Medication management and Plan Continuing to keep an eye on him. Concern is still that we don't want him to become overly manic or agitated or psychotic to a degree that will cause distress or interfere with appropriate treatment. Hasn't gotten to that point so far. Continue off of the Depakote. Continue on the higher dose of Saphris. Continued daily monitoring.   Seems to be staying stable psychiatrically. No change to Saphris. Continue off of Depakote. Disposition: Supportive therapy provided about ongoing stressors.  Alethia Berthold, MD 10/14/2016 6:23 PM

## 2016-10-14 NOTE — Progress Notes (Signed)
[facility] NAME: Brandon Maynard    MR#:  [id number]  DATE OF BIRTH:  07/29/1956  SUBJECTIVE: seen today,says has some left foot pain.getting blood transfusion..   Came in with left leg swelling and erythema Now on HD  REVIEW OF SYSTEMS:   Review of Systems  Constitutional: Positive for fever. Negative for chills and weight loss.  HENT: Negative for ear discharge, ear pain and nosebleeds.   Eyes: Negative for blurred vision, pain and discharge.  Respiratory: Negative for sputum production, shortness of breath, wheezing and stridor.   Cardiovascular: Positive for leg swelling. Negative for chest pain, palpitations, orthopnea and PND.  Gastrointestinal: Negative for abdominal pain, diarrhea, nausea and vomiting.  Genitourinary: Negative for frequency and urgency.  Musculoskeletal: Positive for joint pain. Negative for back pain.  Neurological: Positive for weakness. Negative for sensory change, speech change and focal weakness.  Psychiatric/Behavioral: Negative for depression and hallucinations. The patient is not nervous/anxious.    Tolerating Diet:yes  DRUG ALLERGIES:   Allergies  Allergen Reactions  . Tetracyclines & Related Hives    VITALS:  Blood pressure 116/64, pulse 64, temperature 97.6 F (36.4 C), temperature source Oral, resp. rate 15, height 6\' 3"  (1.905 m), weight 91.6 kg (202 lb), SpO2 100 %.  PHYSICAL EXAMINATION:   Physical Exam  GENERAL:  60 y.o.-year-old patient lying in the bed with no acute distress.  EYES: Pupils equal, round, reactive to light and accommodation. No scleral icterus. Extraocular muscles intact.  HEENT: Head atraumatic, normocephalic. Oropharynx and nasopharynx clear.  NECK:  Supple, no jugular venous distention. No thyroid enlargement, no tenderness.  LUNGS: Normal breath sounds bilaterally, no wheezing, rales, rhonchi. No use of accessory muscles of respiration.  CARDIOVASCULAR: S1,  S2 normal. No murmurs, rubs, or gallops.  ABDOMEN: Soft, nontender, nondistended. Bowel sounds present. No organomegaly or mass.  EXTREMITIES: dressing  Present for left foot. NEUROLOGIC;Alert,awake,oriented during my visit. PSYCHIATRIC: Alert,awake,oriented,  LABORATORY PANEL:  CBC  Recent Labs Lab 10/14/16 0604  WBC 10.9*  HGB 6.3*  HCT 18.7*  PLT 110*    Chemistries   Recent Labs Lab 10/14/16 0604  NA 141  K 5.0  CL 108  CO2 25  GLUCOSE 111*  BUN 55*  CREATININE 3.72*  CALCIUM 7.4*   Cardiac Enzymes No results for input(s): TROPONINI in the last 168 hours. RADIOLOGY:  No results found. ASSESSMENT AND PLAN:   [name]  is a 60 y.o. male with a known history of Bipolar disorder, cataract, chronic thrombocytopenia is presenting to the ED with a chief complaint of significant worsening of left lower extremity pain, redness and swelling which started with a small blister on the left foot. Denies any insect bites  #Sepsis meets criteria with leukocytosis, elevated lactic acid and hypotension. Secondary to left leg cellulitis On   antibiotics  Ancef, patient has Escherichia coli bacteremia . WBC improved from admission, now down to 12.9. Previous MRSA before. Elevated the leg. -Vascular surgery eval noted. Dr Ronalee Belts does not feel any need for intervention for PAD. CT tibia-fibula shows inflammation and swelling. No loculated collection of fluid. rpt blood cultures from 11/15 negative,wound cultures are negative.  #Delirium. Patient has schizophrenia as per medical records. Depakote is stopped because of concern for thrombocytopenia.  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  -plt 42K---18K--22k -multifactorial. Pt has been evaluated by  Oncology in the past and appears due to meds/sepsis and  CKD -Depakote has been discontinued by Dr clapacs  # DVT prophylaxis with SCDs  # Paranoid schizophrenia/ Bipolar disorder - Seen by Dr Weber Cooks  Acute on chronic anemia;;s/p  One unit blood transfusion.  Case discussed with Care Management/Social Worker. Management plans discussed with the patient, family and they are in agreement.  CODE STATUS:FULL  DVT Prophylaxis: SCD  TOTAL TIME TAKING CARE OF THIS PATIENT 35 minutes.   Note: This dictation was prepared with Dragon dictation along with smaller phrase technology. Any transcriptional errors that result from this process are unintentional.  Alonie Gazzola M.D on 10/14/2016 at 6:01 PM  Between 7am to 6pm - Pager - [phone]  After 6pm go to [email or web address] - password [email or web address] [facility]  [facility]  Office  [phone]  CC: Primary care physician; [name], MD

## 2016-10-14 NOTE — OR Nursing (Signed)
Brief fleeting nausea reported, zofran given per order dr dew

## 2016-10-14 NOTE — Progress Notes (Signed)
Pt post perm cath placement, right chest and patent, alert and oriented eating jello, sr per monitor, called and spoke with care nurse Pryor Montes RN with plan reviewed .

## 2016-10-14 NOTE — Op Note (Signed)
OPERATIVE NOTE    PRE-OPERATIVE DIAGNOSIS: 1. ESRD   POST-OPERATIVE DIAGNOSIS: same as above  PROCEDURE: 1. Ultrasound guidance for vascular access to the right internal jugular vein 2. Fluoroscopic guidance for placement of catheter 3. Placement of a 19 cm tip to cuff tunneled hemodialysis catheter via the right internal jugular vein  SURGEON: Leotis Pain, MD  ANESTHESIA:  Local with Moderate conscious sedation for approximately 20 minutes using 4 mg of Versed and 100 mcg of Fentanyl  ESTIMATED BLOOD LOSS: 10 cc  FLUORO TIME: less than one minute  CONTRAST: none  FINDING(S): 1.  Patent right internal jugular vein  SPECIMEN(S):  None  INDICATIONS:   Brandon Maynard. is a 60 y.o.male who presents with ESRD.  The patient needs long term dialysis access for their ESRD, and a Permcath is necessary.  Risks and benefits are discussed and informed consent is obtained.    DESCRIPTION: After obtaining full informed written consent, the patient was brought back to the vascular suited. The patient's right neck and chest were sterilely prepped and draped in a sterile surgical field was created. Moderate conscious sedation was administered during a face to face encounter with the patient throughout the procedure with my supervision of the RN administering medicines and monitoring the patient's vital signs, pulse oximetry, telemetry and mental status throughout from the start of the procedure until the patient was taken to the recovery room.  The right internal jugular vein was visualized with ultrasound and found to be patent. It was then accessed under direct ultrasound guidance and a permanent image was recorded. A wire was placed. After skin nick and dilatation, the peel-away sheath was placed over the wire. I then turned my attention to an area under the clavicle. Approximately 1-2 fingerbreadths below the clavicle a small counterincision was created and tunneled from the subclavicular incision  to the access site. Using fluoroscopic guidance, a 19 centimeter tip to cuff tunneled hemodialysis catheter was selected, and tunneled from the subclavicular incision to the access site. It was then placed through the peel-away sheath and the peel-away sheath was removed. Using fluoroscopic guidance the catheter tips were parked in the right atrium. The appropriate distal connectors were placed. It withdrew blood well and flushed easily with heparinized saline and a concentrated heparin solution was then placed. It was secured to the chest wall with 2 Prolene sutures. The access incision was closed single 4-0 Monocryl. A 4-0 Monocryl pursestring suture was placed around the exit site. Sterile dressings were placed. The patient tolerated the procedure well and was taken to the recovery room in stable condition.  COMPLICATIONS: None  CONDITION: Stable  Leotis Pain  09/06/2016, 12:56 PM   This note was created with Dragon Medical transcription system. Any errors in dictation are purely unintentional.

## 2016-10-15 ENCOUNTER — Encounter: Payer: Self-pay | Admitting: Vascular Surgery

## 2016-10-15 LAB — CBC
HEMATOCRIT: 23 % — AB (ref 40.0–52.0)
HEMOGLOBIN: 7.6 g/dL — AB (ref 13.0–18.0)
MCH: 30.5 pg (ref 26.0–34.0)
MCHC: 32.9 g/dL (ref 32.0–36.0)
MCV: 92.5 fL (ref 80.0–100.0)
Platelets: 123 10*3/uL — ABNORMAL LOW (ref 150–440)
RBC: 2.49 MIL/uL — AB (ref 4.40–5.90)
RDW: 14.5 % (ref 11.5–14.5)
WBC: 12.1 10*3/uL — ABNORMAL HIGH (ref 3.8–10.6)

## 2016-10-15 LAB — RENAL FUNCTION PANEL
ANION GAP: 6 (ref 5–15)
Albumin: 1.4 g/dL — ABNORMAL LOW (ref 3.5–5.0)
BUN: 65 mg/dL — ABNORMAL HIGH (ref 6–20)
CALCIUM: 7.8 mg/dL — AB (ref 8.9–10.3)
CHLORIDE: 110 mmol/L (ref 101–111)
CO2: 24 mmol/L (ref 22–32)
Creatinine, Ser: 4.13 mg/dL — ABNORMAL HIGH (ref 0.61–1.24)
GFR calc non Af Amer: 14 mL/min — ABNORMAL LOW (ref >60)
GFR, EST AFRICAN AMERICAN: 16 mL/min — AB (ref >60)
GLUCOSE: 94 mg/dL (ref 65–99)
Phosphorus: 5.6 mg/dL — ABNORMAL HIGH (ref 2.5–4.6)
Potassium: 4.8 mmol/L (ref 3.5–5.1)
SODIUM: 140 mmol/L (ref 135–145)

## 2016-10-15 LAB — TYPE AND SCREEN
ABO/RH(D): O POS
ANTIBODY SCREEN: NEGATIVE
UNIT DIVISION: 0

## 2016-10-15 NOTE — Clinical Social Work Note (Signed)
Patient still awaiting an outpatient hemodialysis schedule at this time. Plan is to return to family care home at discharge. Shela Leff MSW,LCSW [phone]

## 2016-10-15 NOTE — Progress Notes (Signed)
Nutrition Follow-up  DOCUMENTATION CODES:   Severe malnutrition in context of chronic illness  INTERVENTION:  -Spoke with Dr. Juleen China and fluid restriction not needed at this time but wants to continue renal diet (2 gm Na, low phosphorus and K diet) order modified.   -Continue nepro at this time   NUTRITION DIAGNOSIS:   Malnutrition (Severe) related to chronic illness (kidney disease) as evidenced by severe depletion of body fat, severe depletion of muscle mass.  ongoing  GOAL:   Patient will meet greater than or equal to 90% of their needs  Improving  MONITOR:   PO intake, Supplement acceptance, Skin, I & O's  REASON FOR ASSESSMENT:   Consult  (pt requesting fluids at night)  ASSESSMENT:   Spoke with RN, Lauren and pt ate about 25% of lunch. Noted per I and O sheet intake 75-100% of meals.  RN reports pt is drinking nepro well and tolerating.  Medications reviewed Labs reviewed: Phosphorus 5.6  Urine output: 3.3 L/d  Diet Order:  Diet 2 gram sodium Room service appropriate? Yes; Fluid consistency: Thin  Skin:  Wound (see comment) (leg cellulitis)  Last BM:  11/15  Height:   Ht Readings from Last 1 Encounters:  10/14/16 6\' 3"  (1.905 m)    Weight:   Wt Readings from Last 1 Encounters:  10/14/16 202 lb (91.6 kg)    Ideal Body Weight:  89 kg  BMI:  Body mass index is 25.25 kg/m.  Estimated Nutritional Needs:   Kcal:  2100-2300  Protein:  90-100 grams  Fluid:  1.2 L/day  EDUCATION NEEDS:   Education needs addressed  Tiandra Swoveland B. Zenia Resides, [place], [place] (pager)

## 2016-10-15 NOTE — Care Management Important Message (Signed)
Important Message  Patient Details  Name: Brandon Maynard. MRN: [id number] Date of Birth: 1956-08-10   Medicare Important Message Given:  Yes    Beverly Sessions, RN 10/15/2016, 9:58 AM

## 2016-10-15 NOTE — Progress Notes (Signed)
Brandon Maynard NAME: Brandon Maynard    MR#:  [id number]  DATE OF BIRTH:  June 10, 1956  SUBJECTIVE: seen today c/o SOB.  Came in with left leg swelling and erythema Now on HD  REVIEW OF SYSTEMS:   Review of Systems  Constitutional: Negative for chills, fever and weight loss.  HENT: Negative for ear discharge, ear pain and nosebleeds.   Eyes: Negative for blurred vision, pain and discharge.  Respiratory: Negative for sputum production, shortness of breath, wheezing and stridor.   Cardiovascular: Positive for leg swelling. Negative for chest pain, palpitations, orthopnea and PND.  Gastrointestinal: Negative for abdominal pain, diarrhea, nausea and vomiting.  Genitourinary: Negative for frequency and urgency.  Musculoskeletal: Positive for joint pain. Negative for back pain.  Neurological: Positive for weakness. Negative for sensory change, speech change and focal weakness.  Psychiatric/Behavioral: Negative for depression and hallucinations. The patient is not nervous/anxious.    Tolerating Diet:yes  DRUG ALLERGIES:   Allergies  Allergen Reactions  . Tetracyclines & Related Hives    VITALS:  Blood pressure (!) 106/58, pulse 66, temperature 98.3 F (36.8 C), temperature source Oral, resp. rate 19, height 6\' 3"  (1.905 m), weight 91.6 kg (202 lb), SpO2 98 %.  PHYSICAL EXAMINATION:   Physical Exam  GENERAL:  60 y.o.-year-old patient lying in the bed with no acute distress.  EYES: Pupils equal, round, reactive to light and accommodation. No scleral icterus. Extraocular muscles intact.  HEENT: Head atraumatic, normocephalic. Oropharynx and nasopharynx clear.  NECK:  Supple, no jugular venous distention. No thyroid enlargement, no tenderness.  LUNGS: Normal breath sounds bilaterally, no wheezing, rales, rhonchi. No use of accessory muscles of respiration.  CARDIOVASCULAR: S1, S2 normal. No murmurs, rubs, or gallops.  ABDOMEN: Soft,  nontender, nondistended. Bowel sounds present. No organomegaly or mass.  EXTREMITIES: dressing  Present for left foot. NEUROLOGIC;Alert,awake,oriented during my visit. PSYCHIATRIC: Alert,awake,oriented,  LABORATORY PANEL:  CBC  Recent Labs Lab 10/15/16 0534  WBC 12.1*  HGB 7.6*  HCT 23.0*  PLT 123*    Chemistries   Recent Labs Lab 10/15/16 0534  NA 140  K 4.8  CL 110  CO2 24  GLUCOSE 94  BUN 65*  CREATININE 4.13*  CALCIUM 7.8*   Cardiac Enzymes No results for input(s): TROPONINI in the last 168 hours. RADIOLOGY:  No results found. ASSESSMENT AND PLAN:   Brandon Maynard  is a 60 y.o. male with a known history of Bipolar disorder, cataract, chronic thrombocytopenia is presenting to the ED with a chief complaint of significant worsening of left lower extremity pain, redness and swelling which started with a small blister on the left foot. Denies any insect bites  #Sepsis meets criteria with leukocytosis, elevated lactic acid and hypotension. Secondary to left leg cellulitis On   antibiotics  Ancef, patient has Escherichia coli bacteremia . WBC improved from admission, now down to 12.9. Previous MRSA before. Elevated the leg. -Vascular surgery eval noted. Brandon Maynard does not feel any need for intervention for PAD. CT tibia-fibula shows inflammation and swelling. No loculated collection of fluid. rpt blood cultures from 11/15 negative,wound cultures are negative.  #Delirium. Patient has schizophrenia as per medical records. 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 With nephrology today, patient is making urine, now not decided about future dialysis needs, patient will be here  for  Some time  because of his the fluctuating kidney function and the ongoing need for hemodialysis on and  off.  #Hyponatremia Resolved  #Thrombocytopenia- improving.  -plt 42K---18K--22k -multifactorial. Pt has been evaluated by Oncology in the past and appears due to meds/sepsis and CKD -Depakote has been discontinued by Brandon Maynard  # DVT prophylaxis with SCDs  # Paranoid schizophrenia/ Bipolar disorder - Seen by Brandon Maynard  Acute on chronic anemia;;s/p  One unit blood transfusion.  Case discussed with Care Management/Social Worker. Management plans discussed with the patient, family and they are in agreement.  CODE STATUS:FULL  DVT Prophylaxis: SCD  TOTAL TIME TAKING CARE OF THIS PATIENT 35 minutes.   Note: This dictation was prepared with Dragon dictation along with smaller phrase technology. Any transcriptional errors that result from this process are unintentional.  Brandon Maynard M.D on 10/15/2016 at 11:07 AM  Between 7am to 6pm - Pager - [phone]  After 6pm go to [email or web address] - password [email or web address] [facility]  [facility]  Office  [phone]  CC: Primary care physician; Brandon Miu, MD

## 2016-10-15 NOTE — Consult Note (Signed)
[facility] Consult   Reason for Consult:  Follow-up consult for 60 year old man with a history of chronic mental illness currently in the hospital with acute and complicated medical problems stemming from probably sepsis from a cellulitis and labile blood pressures. Updated problem as of today he is assessing capacity for decision-making Referring Physician:  Patel/Lateef Patient Identification: Brandon T Bonawitz Jr. MRN:  [id number] Principal Diagnosis: Acute delirium Diagnosis:   Patient Active Problem List   Diagnosis Date Noted  . Acute delirium [R41.0] 10/05/2016  . Left leg cellulitis [L84.536] 10/04/2016  . B12 deficiency [E53.8] 07/06/2016  . Anemia [D64.9] 06/16/2016  . Thrombocytopenia ([place]) [D69.6] 06/16/2016  . Weight loss [R63.4] 06/16/2016  . Esophageal reflux [K21.9] 05/12/2016  . 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 Schizoaffective disorder ([place]) [F25.9] 06/13/2014    Total Time spent with patient: 20 minutes  Subjective:   Brandon Somers. is a 60 y.o. male patient admitted with "I'm not feeling good".  HPI:  Follow-up for patient with schizoaffective disorder currently in the hospital being treated for medical problems including renal failure. He is off of his Depakote because of thrombocytopenia. On interview today the patient has no new complaints. Thoughts are still a little disorganized but not frankly bizarre. Denies any suicidal thoughts. Does not appear to be responding to internal stimuli. Not particularly agitated. Not disruptive or violent.  Past Psychiatric History: Chronic schizophrenia or schizoaffective disorder. Recently we have discontinued his Depakote out of concern for it worsening his thrombocytopenia and medical problems. He is still on his antipsychotic.  Risk to Self: Is patient at risk for suicide?: No Risk to Others:   Prior Inpatient Therapy:   Prior  Outpatient Therapy:    Past Medical History:  Past Medical History:  Diagnosis Date  . Anemia   . Bipolar disorder ([place])   . Cancer ([place])   . Cataract    right eye  . DDD (degenerative disc disease)   . GERD (gastroesophageal reflux disease)   . Heart murmur   . Hyperlipidemia   . Hypertension   . Paranoid schizophrenia ([place])   . 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 Thyroid disease     Past Surgical History:  Procedure Laterality Date  . Dermal Abrasion  1973  . PERIPHERAL VASCULAR CATHETERIZATION N/A 10/14/2016   Procedure: Dialysis/Perma Catheter Insertion;  Surgeon: Algernon Huxley, MD;  Location: [facility];  Service: Cardiovascular;  Laterality: N/A;   Family History:  Family History  Problem Relation Age of Onset  . Brain cancer Maternal Uncle   . Breast cancer Mother   . Melanoma Mother   . Congestive Heart Failure Father   . Melanoma Father   . Breast cancer Maternal Aunt   . Diabetes Maternal Aunt    Family Psychiatric  History: No family history identified. Social History:  History  Alcohol Use No     History  Drug Use No    Social History   Social History  . Marital status: Single    Spouse name: N/A  . Number of children: N/A  . Years of education: N/A   Social History Main Topics  . Smoking status: Current Every Day Smoker    Last attempt to quit: 04/09/1991  . Smokeless tobacco: Never Used  . Alcohol use No  . Drug use: No  . Sexual activity: Not Asked   Other Topics Concern  . None   Social History  Narrative  . None   Additional Social History:Patient's brother, who lives in [place], is the only relative actively involved in making decisions. I am told by nephrology that the brother has been contacted and expresses agreement with proceeding with dialysis. Unfortunately he has not been made the healthcare power of attorney or guardian at this point.    Allergies:   Allergies  Allergen Reactions  . Tetracyclines & Related Hives     Labs:  Results for orders placed or performed during the hospital encounter of 10/04/16 (from the past 48 hour(s))  Basic metabolic panel     Status: Abnormal   Collection Time: 10/14/16  6:04 AM  Result Value Ref Range   Sodium 141 135 - 145 mmol/L   Potassium 5.0 3.5 - 5.1 mmol/L   Chloride 108 101 - 111 mmol/L   CO2 25 22 - 32 mmol/L   Glucose, Bld 111 (H) 65 - 99 mg/dL   BUN 55 (H) 6 - 20 mg/dL   Creatinine, Ser 3.72 (H) 0.61 - 1.24 mg/dL   Calcium 7.4 (L) 8.9 - 10.3 mg/dL   GFR calc non Af Amer 16 (L) >60 mL/min   GFR calc Af Amer 19 (L) >60 mL/min    Comment: (NOTE) The eGFR has been calculated using the CKD EPI equation. This calculation has not been validated in all clinical situations. eGFR's persistently <60 mL/min signify possible Chronic Kidney Disease.    Anion gap 8 5 - 15  CBC     Status: Abnormal   Collection Time: 10/14/16  6:04 AM  Result Value Ref Range   WBC 10.9 (H) 3.8 - 10.6 K/uL   RBC 2.05 (L) 4.40 - 5.90 MIL/uL   Hemoglobin 6.3 (L) 13.0 - 18.0 g/dL   HCT 18.7 (L) 40.0 - 52.0 %   MCV 91.6 80.0 - 100.0 fL   MCH 30.8 26.0 - 34.0 pg   MCHC 33.7 32.0 - 36.0 g/dL   RDW 14.0 11.5 - 14.5 %   Platelets 110 (L) 150 - 440 K/uL  Type and screen [place]     Status: None   Collection Time: 10/14/16  9:15 AM  Result Value Ref Range   ABO/RH(D) O POS    Antibody Screen NEG    Sample Expiration 10/17/2016    Unit Number [id number]    Blood Component Type RED CELLS,LR    Unit division 00    Status of Unit ISSUED,FINAL    Transfusion Status OK TO TRANSFUSE    Crossmatch Result Compatible   Prepare RBC     Status: None   Collection Time: 10/14/16  9:15 AM  Result Value Ref Range   Order Confirmation ORDER PROCESSED BY BLOOD BANK   Hemoglobin and hematocrit, blood     Status: Abnormal   Collection Time: 10/14/16  7:12 PM  Result Value Ref Range   Hemoglobin 8.3 (L) 13.0 - 18.0 g/dL    Comment: REPEATED TO VERIFY   HCT 24.9  (L) 40.0 - 52.0 %  CBC     Status: Abnormal   Collection Time: 10/15/16  5:34 AM  Result Value Ref Range   WBC 12.1 (H) 3.8 - 10.6 K/uL   RBC 2.49 (L) 4.40 - 5.90 MIL/uL   Hemoglobin 7.6 (L) 13.0 - 18.0 g/dL   HCT 23.0 (L) 40.0 - 52.0 %   MCV 92.5 80.0 - 100.0 fL   MCH 30.5 26.0 - 34.0 pg   MCHC 32.9 32.0 - 36.0 g/dL  RDW 14.5 11.5 - 14.5 %   Platelets 123 (L) 150 - 440 K/uL  Renal function panel     Status: Abnormal   Collection Time: 10/15/16  5:34 AM  Result Value Ref Range   Sodium 140 135 - 145 mmol/L   Potassium 4.8 3.5 - 5.1 mmol/L   Chloride 110 101 - 111 mmol/L   CO2 24 22 - 32 mmol/L   Glucose, Bld 94 65 - 99 mg/dL   BUN 65 (H) 6 - 20 mg/dL   Creatinine, Ser 4.13 (H) 0.61 - 1.24 mg/dL   Calcium 7.8 (L) 8.9 - 10.3 mg/dL   Phosphorus 5.6 (H) 2.5 - 4.6 mg/dL   Albumin 1.4 (L) 3.5 - 5.0 g/dL   GFR calc non Af Amer 14 (L) >60 mL/min   GFR calc Af Amer 16 (L) >60 mL/min    Comment: (NOTE) The eGFR has been calculated using the CKD EPI equation. This calculation has not been validated in all clinical situations. eGFR's persistently <60 mL/min signify possible Chronic Kidney Disease.    Anion gap 6 5 - 15    Current Facility-Administered Medications  Medication Dose Route Frequency Provider Last Rate Last Dose  . 0.9 %  sodium chloride infusion  100 mL Intravenous PRN Munsoor Lateef, MD      . 0.9 %  sodium chloride infusion  100 mL Intravenous PRN Munsoor Lateef, MD      . acetaminophen (TYLENOL) tablet 650 mg  650 mg Oral Q6H PRN Sona Patel, MD   650 mg at 10/15/16 1455  . alteplase (CATHFLO ACTIVASE) injection 2 mg  2 mg Intracatheter Once PRN Munsoor Lateef, MD      . asenapine (SAPHRIS) sublingual tablet 10 mg  10 mg Sublingual BID Caley T , MD   10 mg at 10/15/16 1026  . brimonidine (ALPHAGAN) 0.2 % ophthalmic solution 1 drop  1 drop Both Eyes Q12H Aruna Gouru, MD   1 drop at 10/15/16 1029   And  . timolol (TIMOPTIC) 0.5 % ophthalmic solution 1 drop  1  drop Both Eyes Q12H Aruna Gouru, MD   1 drop at 10/15/16 1029  . ceFAZolin (ANCEF) IVPB 1 g/50 mL premix  1 g Intravenous q1800 Scott D Christy, RPH   1 g at 10/15/16 1724  . cefUROXime (ZINACEF) 1.5 g in dextrose 5 % 50 mL IVPB  1.5 g Intravenous Q8H Jason S Dew, MD   1.5 g at 10/15/16 1419  . docusate sodium (COLACE) capsule 100 mg  100 mg Oral BID Aruna Gouru, MD   100 mg at 10/15/16 1026  . feeding supplement (NEPRO CARB STEADY) liquid 237 mL  237 mL Oral BID BM Snehalatha Konidena, MD   237 mL at 10/15/16 1419  . FLUoxetine (PROZAC) capsule 40 mg  40 mg Oral Daily Aruna Gouru, MD   40 mg at 10/15/16 1027  . haloperidol lactate (HALDOL) injection 2 mg  2 mg Intravenous Q6H PRN Jance T , MD   2 mg at 10/14/16 0253  . heparin injection 1,000 Units  1,000 Units Dialysis PRN Munsoor Lateef, MD      . latanoprost (XALATAN) 0.005 % ophthalmic solution 1 drop  1 drop Both Eyes QHS Aruna Gouru, MD   1 drop at 10/14/16 2123  . levothyroxine (SYNTHROID, LEVOTHROID) tablet 50 mcg  50 mcg Oral QAC breakfast Aruna Gouru, MD   50 mcg at 10/15/16 0753  . lidocaine (PF) (XYLOCAINE) 1 % injection 5 mL  5 mL   Intradermal PRN Munsoor Lateef, MD      . lidocaine-prilocaine (EMLA) cream 1 application  1 application Topical PRN Munsoor Lateef, MD      . multivitamin with minerals tablet 1 tablet  1 tablet Oral Daily Aruna Gouru, MD   1 tablet at 10/15/16 1027  . ondansetron (ZOFRAN) tablet 4 mg  4 mg Oral Q6H PRN Aruna Gouru, MD       Or  . ondansetron (ZOFRAN) injection 4 mg  4 mg Intravenous Q6H PRN Aruna Gouru, MD   4 mg at 10/14/16 1615  . oxyCODONE (Oxy IR/ROXICODONE) immediate release tablet 5 mg  5 mg Oral Q6H PRN Sona Patel, MD   5 mg at 10/15/16 0558  . pentafluoroprop-tetrafluoroeth (GEBAUERS) aerosol 1 application  1 application Topical PRN Munsoor Lateef, MD      . pyridOXINE (VITAMIN B-6) tablet 100 mg  100 mg Oral BID Aruna Gouru, MD   100 mg at 10/15/16 1047  . silver sulfADIAZINE (SILVADENE) 1  % cream   Topical BID Sona Patel, MD      . simvastatin (ZOCOR) tablet 20 mg  20 mg Oral QHS Aruna Gouru, MD   20 mg at 10/14/16 2120  . traZODone (DESYREL) tablet 100 mg  100 mg Oral QHS Aruna Gouru, MD   100 mg at 10/14/16 2120   Facility-Administered Medications Ordered in Other Encounters  Medication Dose Route Frequency Provider Last Rate Last Dose  . cyanocobalamin ((VITAMIN B-12)) injection 1,000 mcg  1,000 mcg Intramuscular Once Melissa C Corcoran, MD        Musculoskeletal: Strength & Muscle Tone: decreased Gait & Station: unable to stand Patient leans: Backward  Psychiatric Specialty Exam: Physical Exam  Nursing note and vitals reviewed. Constitutional: He appears well-developed and well-nourished.  HENT:  Head: Normocephalic and atraumatic.  Eyes: Conjunctivae are normal. Pupils are equal, round, and reactive to light.  Neck: Normal range of motion.  Cardiovascular: Normal heart sounds.   Respiratory: No respiratory distress.  GI: Soft.  Musculoskeletal: Normal range of motion.  Neurological: He is alert.  Skin: Skin is warm and dry.     Psychiatric: His affect is labile. His speech is tangential. His speech is not delayed and not slurred. He is slowed. He is not agitated. He does not express impulsivity. He expresses no suicidal ideation. He expresses no suicidal plans. He exhibits abnormal recent memory.    Review of Systems  Constitutional: Negative.   HENT: Negative.   Eyes: Negative.   Respiratory: Negative.   Cardiovascular: Negative.   Gastrointestinal: Negative.   Musculoskeletal: Positive for joint pain and myalgias.  Skin: Negative.   Neurological: Negative.   Psychiatric/Behavioral: Negative for depression, hallucinations, memory loss, substance abuse and suicidal ideas. The patient is not nervous/anxious and does not have insomnia.     Blood pressure 108/65, pulse 70, temperature 99.7 F (37.6 C), temperature source Oral, resp. rate 16, height 6' 3"  (1.905 m), weight 91.6 kg (202 lb), SpO2 98 %.Body mass index is 25.25 kg/m.  General Appearance: Disheveled  Eye Contact:  None  Speech:  Garbled  Volume:  Decreased  Mood:  Irritable  Affect:  Full Range  Thought Process:  Goal Directed  Orientation:  Full (Time, Place, and Person)  Thought Content:  Patient has some disorganization of his thought but he is able to be redirected and he actually was able to discuss his current condition without reference to anything that appeared to be obviously delusional.  Suicidal Thoughts:    No  Homicidal Thoughts:  No  Memory:  Immediate;   Fair Recent;   Fair Remote;   Fair  Judgement:  Impaired  Insight:  Shallow  Psychomotor Activity:  Decreased  Concentration:  Concentration: Poor  Recall:  Fair  Fund of Knowledge:  Fair  Language:  Fair  Akathisia:  No  Handed:  Right  AIMS (if indicated):     Assets:  Communication Skills Desire for Improvement Social Support  ADL's:  Impaired  Cognition:  Impaired,  Mild  Sleep:        Treatment Plan Summary: Daily contact with patient to assess and evaluate symptoms and progress in treatment, Medication management and Plan Continue off of Depakote. Follow-up regularly. Please call the on-call psychiatrist over the weekend if needed otherwise I will follow-up on Monday. He seems to be doing okay at this point without the mood stabilizer as long as he stays on the Saphris.   Seems to be staying stable psychiatrically. No change to Saphris. Continue off of Depakote. Disposition: Supportive therapy provided about ongoing stressors.  Brandon Maynard , MD 10/15/2016 6:03 PM 

## 2016-10-15 NOTE — Progress Notes (Signed)
[facility]  ROUNDING NOTE   Subjective:   Tunneled catheter placed yesterday.  PRBC transfusion yesterday.   UOP 3300  Objective:  Vital signs in last 24 hours:  Temp:  [97.6 F (36.4 C)-98.8 F (37.1 C)] 98.3 F (36.8 C) (11/17 0458) Pulse Rate:  [57-88] 66 (11/17 0458) Resp:  [11-19] 19 (11/17 0458) BP: (106-122)/(57-94) 106/58 (11/17 0458) SpO2:  [98 %-100 %] 98 % (11/17 0458) Weight:  [91.6 kg (202 lb)] 91.6 kg (202 lb) (11/16 1504)  Weight change: -0.073 kg (-2.6 oz) Filed Weights   10/13/16 1350 10/13/16 1705 10/14/16 1504  Weight: 91.7 kg (202 lb 2.6 oz) 91.7 kg (202 lb 2.6 oz) 91.6 kg (202 lb)    Intake/Output: I/O last 3 completed shifts: In: 1994.6 [P.O.:1497; I.V.:2.9; Blood:370; IV Piggyback:124.7] Out: 4700 [Urine:4700]   Intake/Output this shift:  Total I/O In: 240 [P.O.:240] Out: 850 [Urine:850]  Physical Exam: General: Laying in bed  Head: Normocephalic, atraumatic. Moist oral mucosal membranes  Eyes: Anicteric  Neck: Supple, trachea midline  Lungs:  Clear to auscultation, normal effort  Heart: S1S2 no rubs  Abdomen:  Soft, nontender, BS present  Extremities: 1+ edema left lower extremity, +erythema and clean dressings.   Neurologic: Awake, follows commands  Skin: Left foot induration  Access:  Right IJ permcath 11/16 Dr. Lucky Cowboy    Basic Metabolic Panel:  Recent Labs Lab 10/10/16 1455 10/11/16 1149 10/12/16 0425 10/13/16 0554 10/14/16 0604 10/15/16 0534  NA 138 143 144 147* 141 140  K 4.5 4.4 4.9 5.3* 5.0 4.8  CL 109 111 114* 118* 108 110  CO2 21* 24 22 21* 25 24  GLUCOSE 116* 113* 103* 98 111* 94  BUN 93* 70* 74* 76* 55* 65*  CREATININE 4.81* 3.96* 4.26* 4.63* 3.72* 4.13*  CALCIUM 7.6* 7.7* 7.8* 7.9* 7.4* 7.8*  PHOS 5.6* 5.7* 5.7* 6.8*  --  5.6*    Liver Function Tests:  Recent Labs Lab 10/10/16 1455 10/11/16 1149 10/12/16 0425 10/13/16 0554 10/15/16 0534  ALBUMIN 1.4* 1.3* 1.3* 1.4* 1.4*   No results for  input(s): LIPASE, AMYLASE in the last 168 hours. No results for input(s): AMMONIA in the last 168 hours.  CBC:  Recent Labs Lab 10/11/16 0809 10/13/16 1400 10/14/16 0604 10/14/16 1912 10/15/16 0534  WBC 12.9* 13.1* 10.9*  --  12.1*  NEUTROABS 10.1*  --   --   --   --   HGB 8.1* 7.6* 6.3* 8.3* 7.6*  HCT 23.8* 23.7* 18.7* 24.9* 23.0*  MCV 88.8 92.9 91.6  --  92.5  PLT 72* 136* 110*  --  123*    Cardiac Enzymes: No results for input(s): CKTOTAL, CKMB, CKMBINDEX, TROPONINI in the last 168 hours.  BNP: Invalid input(s): POCBNP  CBG: No results for input(s): GLUCAP in the last 168 hours.  Microbiology: Results for orders placed or performed during the hospital encounter of 10/04/16  Blood Culture (routine x 2)     Status: Abnormal   Collection Time: 10/04/16  3:51 PM  Result Value Ref Range Status   Specimen Description BLOOD RT [facility]  Final   Special Requests BOTTLES DRAWN AEROBIC AND ANAEROBIC 10CC  Final   Culture  Setup Time   Final    Organism ID to follow GRAM NEGATIVE RODS IN BOTH AEROBIC AND ANAEROBIC BOTTLES CRITICAL RESULT CALLED TO, READ BACK BY AND VERIFIED WITH: NATE COOKSON ON 10/05/16 AT 0448 BY TLB CONFIRMED BY TLB    Culture ESCHERICHIA COLI (A)  Final  Report Status 10/07/2016 FINAL  Final   Organism ID, Bacteria ESCHERICHIA COLI  Final      Susceptibility   Escherichia coli - MIC*    AMPICILLIN >=32 RESISTANT Resistant     CEFAZOLIN <=4 SENSITIVE Sensitive     CEFEPIME <=1 SENSITIVE Sensitive     CEFTAZIDIME <=1 SENSITIVE Sensitive     CEFTRIAXONE <=1 SENSITIVE Sensitive     CIPROFLOXACIN <=0.25 SENSITIVE Sensitive     GENTAMICIN <=1 SENSITIVE Sensitive     IMIPENEM <=0.25 SENSITIVE Sensitive     TRIMETH/SULFA >=320 RESISTANT Resistant     AMPICILLIN/SULBACTAM 16 INTERMEDIATE Intermediate     PIP/TAZO <=4 SENSITIVE Sensitive     Extended ESBL NEGATIVE Sensitive     * ESCHERICHIA COLI  Blood Culture ID Panel (Reflexed)     Status: Abnormal    Collection Time: 10/04/16  3:51 PM  Result Value Ref Range Status   Enterococcus species NOT DETECTED NOT DETECTED Final   Listeria monocytogenes NOT DETECTED NOT DETECTED Final   Staphylococcus species NOT DETECTED NOT DETECTED Final   Staphylococcus aureus NOT DETECTED NOT DETECTED Final   Streptococcus species NOT DETECTED NOT DETECTED Final   Streptococcus agalactiae NOT DETECTED NOT DETECTED Final   Streptococcus pneumoniae NOT DETECTED NOT DETECTED Final   Streptococcus pyogenes NOT DETECTED NOT DETECTED Final   Acinetobacter baumannii NOT DETECTED NOT DETECTED Final   Enterobacteriaceae species DETECTED (A) NOT DETECTED Final    Comment: CRITICAL RESULT CALLED TO, READ BACK BY AND VERIFIED WITH: NATE COOKSON ON 10/05/16 AT 0448 BY TLB    Enterobacter cloacae complex NOT DETECTED NOT DETECTED Final   Escherichia coli DETECTED (A) NOT DETECTED Final    Comment: CRITICAL RESULT CALLED TO, READ BACK BY AND VERIFIED WITH: NATE COOKSON ON 10/05/16 AT 0448 BY TLB                                                                                                Klebsiella oxytoca NOT DETECTED NOT DETECTED Final   Klebsiella pneumoniae NOT DETECTED NOT DETECTED Final   Proteus species NOT DETECTED NOT DETECTED Final   Serratia marcescens NOT DETECTED NOT DETECTED Final   Carbapenem resistance NOT DETECTED NOT DETECTED Final   Haemophilus influenzae NOT DETECTED NOT DETECTED Final   Neisseria meningitidis NOT DETECTED NOT DETECTED Final   Pseudomonas aeruginosa NOT DETECTED NOT DETECTED Final   Candida albicans NOT DETECTED NOT DETECTED Final   Candida glabrata NOT DETECTED NOT DETECTED Final   Candida krusei NOT DETECTED NOT DETECTED Final   Candida parapsilosis NOT DETECTED NOT DETECTED Final   Candida tropicalis NOT DETECTED NOT DETECTED Final  Blood Culture (routine x 2)     Status: Abnormal   Collection Time: 10/04/16  3:56 PM  Result Value Ref Range Status   Specimen Description  BLOOD LT [facility]  Final   Special Requests BOTTLES DRAWN AEROBIC AND ANAEROBIC 10CC  Final   Culture  Setup Time   Final    GRAM NEGATIVE RODS IN BOTH AEROBIC AND ANAEROBIC BOTTLES CRITICAL RESULT CALLED TO, READ BACK BY AND VERIFIED WITH: NATE  COOKSON ON 10/05/16 AT 0448 BY TLB CONFIRMED BY TLB    Culture ESCHERICHIA COLI (A)  Final   Report Status 10/07/2016 FINAL  Final  Urine culture     Status: Abnormal   Collection Time: 10/04/16  9:01 PM  Result Value Ref Range Status   Specimen Description URINE, RANDOM  Final   Special Requests NONE  Final   Culture 30,000 COLONIES/mL ESCHERICHIA COLI (A)  Final   Report Status 10/07/2016 FINAL  Final   Organism ID, Bacteria ESCHERICHIA COLI (A)  Final      Susceptibility   Escherichia coli - MIC*    AMPICILLIN >=32 RESISTANT Resistant     CEFAZOLIN <=4 SENSITIVE Sensitive     CEFTRIAXONE <=1 SENSITIVE Sensitive     CIPROFLOXACIN <=0.25 SENSITIVE Sensitive     GENTAMICIN <=1 SENSITIVE Sensitive     IMIPENEM <=0.25 SENSITIVE Sensitive     NITROFURANTOIN <=16 SENSITIVE Sensitive     TRIMETH/SULFA >=320 RESISTANT Resistant     AMPICILLIN/SULBACTAM 16 INTERMEDIATE Intermediate     PIP/TAZO <=4 SENSITIVE Sensitive     Extended ESBL NEGATIVE Sensitive     * 30,000 COLONIES/mL ESCHERICHIA COLI  MRSA PCR Screening     Status: None   Collection Time: 10/04/16  9:01 PM  Result Value Ref Range Status   MRSA by PCR NEGATIVE NEGATIVE Final    Comment:        The GeneXpert MRSA Assay (FDA approved for NASAL specimens only), is one component of a comprehensive MRSA colonization surveillance program. It is not intended to diagnose MRSA infection nor to guide or monitor treatment for MRSA infections.   MRSA PCR Screening     Status: None   Collection Time: 10/06/16 11:59 AM  Result Value Ref Range Status   MRSA by PCR NEGATIVE NEGATIVE Final    Comment:        The GeneXpert MRSA Assay (FDA approved for NASAL specimens only), is one component  of a comprehensive MRSA colonization surveillance program. It is not intended to diagnose MRSA infection nor to guide or monitor treatment for MRSA infections.   Aerobic Culture (superficial specimen)     Status: None   Collection Time: 10/11/16  4:03 PM  Result Value Ref Range Status   Specimen Description LEG  Final   Special Requests Normal  Final   Gram Stain NO WBC SEEN NO ORGANISMS SEEN   Final   Culture   Final    NO GROWTH 3 DAYS Performed at [facility]    Report Status 10/14/2016 FINAL  Final  CULTURE, BLOOD (ROUTINE X 2) w Reflex to ID Panel     Status: None (Preliminary result)   Collection Time: 10/13/16  9:33 AM  Result Value Ref Range Status   Specimen Description BLOOD RIGHT HAND  Final   Special Requests   Final    BOTTLES DRAWN AEROBIC AND ANAEROBIC [place]   Culture NO GROWTH 2 DAYS  Final   Report Status PENDING  Incomplete  CULTURE, BLOOD (ROUTINE X 2) w Reflex to ID Panel     Status: None (Preliminary result)   Collection Time: 10/13/16  9:40 AM  Result Value Ref Range Status   Specimen Description BLOOD LEFT ASSIST CONTROL  Final   Special Requests   Final    BOTTLES DRAWN AEROBIC AND ANAEROBIC [place]   Culture NO GROWTH 2 DAYS  Final   Report Status PENDING  Incomplete    Coagulation Studies: No results for input(s):  LABPROT, INR in the last 72 hours.  Urinalysis: No results for input(s): COLORURINE, LABSPEC, PHURINE, GLUCOSEU, HGBUR, BILIRUBINUR, KETONESUR, PROTEINUR, UROBILINOGEN, NITRITE, LEUKOCYTESUR in the last 72 hours.  Invalid input(s): APPERANCEUR    Imaging: No results found.   Medications:    . asenapine  10 mg Sublingual BID  . brimonidine  1 drop Both Eyes Q12H   And  . timolol  1 drop Both Eyes Q12H  .  ceFAZolin (ANCEF) IV  1 g Intravenous q1800  . cefUROXime (ZINACEF)  IV  1.5 g Intravenous Q8H  . docusate sodium  100 mg Oral BID  . feeding supplement (NEPRO CARB STEADY)  237 mL Oral BID BM   . FLUoxetine  40 mg Oral Daily  . latanoprost  1 drop Both Eyes QHS  . levothyroxine  50 mcg Oral QAC breakfast  . multivitamin with minerals  1 tablet Oral Daily  . pyridOXINE  100 mg Oral BID  . silver sulfADIAZINE   Topical BID  . simvastatin  20 mg Oral QHS  . traZODone  100 mg Oral QHS   sodium chloride, sodium chloride, acetaminophen, alteplase, haloperidol lactate, heparin, lidocaine (PF), lidocaine-prilocaine, ondansetron **OR** ondansetron (ZOFRAN) IV, oxyCODONE, pentafluoroprop-tetrafluoroeth  Assessment/ Plan:  60 y.o. white male with past medication history of anemia, bipolar disorder, degenerative disc disease, GERD, hyperlipidemia, hypertension, paranoid schizophrenia, who was admitted to [facility] on 10/04/2016 for evaluation of left leg swelling and redness.   1.  Acute renal failure on chronic kidney disease stage III: baseline creatinine of 2.1 EGFR 32 with proteinuria Acute renal failure secondary to sepsis, hypotension and ATN Requiring renal replacement. Two dialysis treatments 11/11 and 11/12.  Nonoliguric urine output. However creatinine continues to rise Chronic kidney disease secondary to hypertension  - Last Hemodialysis 11/15 due to hyperkalemia, and metabolic acidosis.   2.  Hypotension: blood pressure has improved. Secondary to sepsis.  Takes labetalol at home.   3.  Left lower extremity cellulitis: with sepsis. E coli from blood cultures 11/6  Leukocytosis improving. Afebrile - cefazolin  - repeat blood cultures negative.  - Appreciate vascular input.   4. Anemia and thrombocytopenia: hemoglobin low. Platelets improved off valproic acid Hematology consulted on 11/9 PRBC transfusion on 11/16   LOS: [place], [place] 11/17/201711:50 AM

## 2016-10-15 NOTE — Care Management (Signed)
PPD results faxed to [facility]wner 10/14/16

## 2016-10-15 NOTE — Progress Notes (Signed)
[place] INFECTIOUS DISEASE PROGRESS NOTE Date of Admission:  10/04/2016     ID: Brandon Oiler. is a 60 y.o. male with  E coli sepsis and LE cellulitis Principal Problem:   Acute delirium Active Problems:   Schizoaffective disorder (HCC)   Thrombocytopenia (HCC)   Left leg cellulitis   Subjective: No fevers, trying to elevate leg.   ROS  Eleven systems are reviewed and negative except per hpi  Medications:  Antibiotics Given (last 72 hours)    Date/Time Action Medication Dose Rate   10/12/16 1800 Given   ceFAZolin (ANCEF) IVPB 1 g/50 mL premix 1 g 100 mL/hr   10/13/16 1753 Given   ceFAZolin (ANCEF) IVPB 1 g/50 mL premix 1 g 100 mL/hr   10/14/16 1600 Given   cefUROXime (ZINACEF) 1.5 g in dextrose 5 % 50 mL IVPB 1.5 g 100 mL/hr   10/14/16 1800 Given   ceFAZolin (ANCEF) IVPB 1 g/50 mL premix 1 g 100 mL/hr   10/14/16 2124 Given   cefUROXime (ZINACEF) 1.5 g in dextrose 5 % 50 mL IVPB 1.5 g 100 mL/hr   10/15/16 0530 Given   cefUROXime (ZINACEF) 1.5 g in dextrose 5 % 50 mL IVPB 1.5 g 100 mL/hr   10/15/16 1419 Given   cefUROXime (ZINACEF) 1.5 g in dextrose 5 % 50 mL IVPB 1.5 g 100 mL/hr     . asenapine  10 mg Sublingual BID  . brimonidine  1 drop Both Eyes Q12H   And  . timolol  1 drop Both Eyes Q12H  .  ceFAZolin (ANCEF) IV  1 g Intravenous q1800  . cefUROXime (ZINACEF)  IV  1.5 g Intravenous Q8H  . docusate sodium  100 mg Oral BID  . feeding supplement (NEPRO CARB STEADY)  237 mL Oral BID BM  . FLUoxetine  40 mg Oral Daily  . latanoprost  1 drop Both Eyes QHS  . levothyroxine  50 mcg Oral QAC breakfast  . multivitamin with minerals  1 tablet Oral Daily  . pyridOXINE  100 mg Oral BID  . silver sulfADIAZINE   Topical BID  . simvastatin  20 mg Oral QHS  . traZODone  100 mg Oral QHS    Objective: Vital signs in last 24 hours: Temp:  [97.6 F (36.4 C)-99.7 F (37.6 C)] 99.7 F (37.6 C) (11/17 1434) Pulse Rate:  [57-88] 70 (11/17 1434) Resp:  [11-19] 16 (11/17  1434) BP: (106-122)/(58-94) 108/65 (11/17 1434) SpO2:  [98 %-100 %] 98 % (11/17 1434) Weight:  [91.6 kg (202 lb)] 91.6 kg (202 lb) (11/16 1504) Constitutional: He is oriented to person, place, and time. Dishelved, chronically ill appearing HENT:  Mouth/Throat: Oropharynx is clear and moist. No oropharyngeal exudate.  Cardiovascular: Normal rate, regular rhythm and normal heart sounds. Exam reveals no gallop and no friction rub.  No murmur heard.  Pulmonary/Chest: Effort normal and breath sounds normal. No respiratory distress. He has no wheezes.  Abdominal: Soft. Bowel sounds are normal. He exhibits no distension. There is no tenderness.  Lymphadenopathy:  He has no cervical adenopathy.  Neurological: He is alert and oriented to person, place, and time.  LLE 2 + edema Skin: L leg with diffuse erythema to knee, receding now from line drawn previously. large buallea on dorsum of foot, some yellow drainage. Mod ttp, extensive ecchymosis  Lab Results  Recent Labs  10/14/16 0604 10/14/16 1912 10/15/16 0534  WBC 10.9*  --  12.1*  HGB 6.3* 8.3* 7.6*  HCT 18.7*  24.9* 23.0*  NA 141  --  140  K 5.0  --  4.8  CL 108  --  110  CO2 25  --  24  BUN 55*  --  65*  CREATININE 3.72*  --  4.13*    Microbiology: Results for orders placed or performed during the hospital encounter of 10/04/16  Blood Culture (routine x 2)     Status: Abnormal   Collection Time: 10/04/16  3:51 PM  Result Value Ref Range Status   Specimen Description BLOOD RT [facility]  Final   Special Requests BOTTLES DRAWN AEROBIC AND ANAEROBIC 10CC  Final   Culture  Setup Time   Final    Organism ID to follow GRAM NEGATIVE RODS IN BOTH AEROBIC AND ANAEROBIC BOTTLES CRITICAL RESULT CALLED TO, READ BACK BY AND VERIFIED WITH: NATE COOKSON ON 10/05/16 AT 0448 BY TLB CONFIRMED BY TLB    Culture ESCHERICHIA COLI (A)  Final   Report Status 10/07/2016 FINAL  Final   Organism ID, Bacteria ESCHERICHIA COLI  Final      Susceptibility    Escherichia coli - MIC*    AMPICILLIN >=32 RESISTANT Resistant     CEFAZOLIN <=4 SENSITIVE Sensitive     CEFEPIME <=1 SENSITIVE Sensitive     CEFTAZIDIME <=1 SENSITIVE Sensitive     CEFTRIAXONE <=1 SENSITIVE Sensitive     CIPROFLOXACIN <=0.25 SENSITIVE Sensitive     GENTAMICIN <=1 SENSITIVE Sensitive     IMIPENEM <=0.25 SENSITIVE Sensitive     TRIMETH/SULFA >=320 RESISTANT Resistant     AMPICILLIN/SULBACTAM 16 INTERMEDIATE Intermediate     PIP/TAZO <=4 SENSITIVE Sensitive     Extended ESBL NEGATIVE Sensitive     * ESCHERICHIA COLI  Blood Culture ID Panel (Reflexed)     Status: Abnormal   Collection Time: 10/04/16  3:51 PM  Result Value Ref Range Status   Enterococcus species NOT DETECTED NOT DETECTED Final   Listeria monocytogenes NOT DETECTED NOT DETECTED Final   Staphylococcus species NOT DETECTED NOT DETECTED Final   Staphylococcus aureus NOT DETECTED NOT DETECTED Final   Streptococcus species NOT DETECTED NOT DETECTED Final   Streptococcus agalactiae NOT DETECTED NOT DETECTED Final   Streptococcus pneumoniae NOT DETECTED NOT DETECTED Final   Streptococcus pyogenes NOT DETECTED NOT DETECTED Final   Acinetobacter baumannii NOT DETECTED NOT DETECTED Final   Enterobacteriaceae species DETECTED (A) NOT DETECTED Final    Comment: CRITICAL RESULT CALLED TO, READ BACK BY AND VERIFIED WITH: NATE COOKSON ON 10/05/16 AT 0448 BY TLB    Enterobacter cloacae complex NOT DETECTED NOT DETECTED Final   Escherichia coli DETECTED (A) NOT DETECTED Final    Comment: CRITICAL RESULT CALLED TO, READ BACK BY AND VERIFIED WITH: NATE COOKSON ON 10/05/16 AT 0448 BY TLB                                                                                                Klebsiella oxytoca NOT DETECTED NOT DETECTED Final   Klebsiella pneumoniae NOT DETECTED NOT DETECTED Final   Proteus species NOT DETECTED NOT DETECTED Final   Serratia marcescens NOT DETECTED  NOT DETECTED Final   Carbapenem resistance NOT  DETECTED NOT DETECTED Final   Haemophilus influenzae NOT DETECTED NOT DETECTED Final   Neisseria meningitidis NOT DETECTED NOT DETECTED Final   Pseudomonas aeruginosa NOT DETECTED NOT DETECTED Final   Candida albicans NOT DETECTED NOT DETECTED Final   Candida glabrata NOT DETECTED NOT DETECTED Final   Candida krusei NOT DETECTED NOT DETECTED Final   Candida parapsilosis NOT DETECTED NOT DETECTED Final   Candida tropicalis NOT DETECTED NOT DETECTED Final  Blood Culture (routine x 2)     Status: Abnormal   Collection Time: 10/04/16  3:56 PM  Result Value Ref Range Status   Specimen Description BLOOD LT [place]  Final   Special Requests BOTTLES DRAWN AEROBIC AND ANAEROBIC 10CC  Final   Culture  Setup Time   Final    GRAM NEGATIVE RODS IN BOTH AEROBIC AND ANAEROBIC BOTTLES CRITICAL RESULT CALLED TO, READ BACK BY AND VERIFIED WITH: NATE COOKSON ON 10/05/16 AT 0448 BY TLB CONFIRMED BY TLB    Culture ESCHERICHIA COLI (A)  Final   Report Status 10/07/2016 FINAL  Final  Urine culture     Status: Abnormal   Collection Time: 10/04/16  9:01 PM  Result Value Ref Range Status   Specimen Description URINE, RANDOM  Final   Special Requests NONE  Final   Culture 30,000 COLONIES/mL ESCHERICHIA COLI (A)  Final   Report Status 10/07/2016 FINAL  Final   Organism ID, Bacteria ESCHERICHIA COLI (A)  Final      Susceptibility   Escherichia coli - MIC*    AMPICILLIN >=32 RESISTANT Resistant     CEFAZOLIN <=4 SENSITIVE Sensitive     CEFTRIAXONE <=1 SENSITIVE Sensitive     CIPROFLOXACIN <=0.25 SENSITIVE Sensitive     GENTAMICIN <=1 SENSITIVE Sensitive     IMIPENEM <=0.25 SENSITIVE Sensitive     NITROFURANTOIN <=16 SENSITIVE Sensitive     TRIMETH/SULFA >=320 RESISTANT Resistant     AMPICILLIN/SULBACTAM 16 INTERMEDIATE Intermediate     PIP/TAZO <=4 SENSITIVE Sensitive     Extended ESBL NEGATIVE Sensitive     * 30,000 COLONIES/mL ESCHERICHIA COLI  MRSA PCR Screening     Status: None   Collection Time:  10/04/16  9:01 PM  Result Value Ref Range Status   MRSA by PCR NEGATIVE NEGATIVE Final    Comment:        The GeneXpert MRSA Assay (FDA approved for NASAL specimens only), is one component of a comprehensive MRSA colonization surveillance program. It is not intended to diagnose MRSA infection nor to guide or monitor treatment for MRSA infections.   MRSA PCR Screening     Status: None   Collection Time: 10/06/16 11:59 AM  Result Value Ref Range Status   MRSA by PCR NEGATIVE NEGATIVE Final    Comment:        The GeneXpert MRSA Assay (FDA approved for NASAL specimens only), is one component of a comprehensive MRSA colonization surveillance program. It is not intended to diagnose MRSA infection nor to guide or monitor treatment for MRSA infections.   Aerobic Culture (superficial specimen)     Status: None   Collection Time: 10/11/16  4:03 PM  Result Value Ref Range Status   Specimen Description LEG  Final   Special Requests Normal  Final   Gram Stain NO WBC SEEN NO ORGANISMS SEEN   Final   Culture   Final    NO GROWTH 3 DAYS Performed at [facility]  Report Status 10/14/2016 FINAL  Final  CULTURE, BLOOD (ROUTINE X 2) w Reflex to ID Panel     Status: None (Preliminary result)   Collection Time: 10/13/16  9:33 AM  Result Value Ref Range Status   Specimen Description BLOOD RIGHT HAND  Final   Special Requests   Final    BOTTLES DRAWN AEROBIC AND ANAEROBIC [place]   Culture NO GROWTH 2 DAYS  Final   Report Status PENDING  Incomplete  CULTURE, BLOOD (ROUTINE X 2) w Reflex to ID Panel     Status: None (Preliminary result)   Collection Time: 10/13/16  9:40 AM  Result Value Ref Range Status   Specimen Description BLOOD LEFT ASSIST CONTROL  Final   Special Requests   Final    BOTTLES DRAWN AEROBIC AND ANAEROBIC [place]   Culture NO GROWTH 2 DAYS  Final   Report Status PENDING  Incomplete    Studies/Results: No results  found.  Assessment/Plan: Brandon Maynard. is a 60 y.o. male with multiple medical problems admitted with severe bullous LLE cellulitis, ARF and E coli bacteremia with E coli UCX as well. His wbc markedly increased initially but now down to 12.1  He reports he has had MRSA in past but I cannot find any records indicating such.  He is now on HD. Initially on meropenem and vanco but then changed to keflex but it appeared to worsen although no fever or increased wbc.  Day 12 of abx, cx wound negative.   Recommendations Cont  Ancef for total 14 days then can change to keflex for another week  - this should could strep, staph and the E coli in his blood Instructed pt to elevate on 3-4 pillows I will see again Monday if still inpatient  [employer],  Jon P   10/15/2016, 2:53 PM

## 2016-10-16 LAB — RENAL FUNCTION PANEL
Albumin: 1.3 g/dL — ABNORMAL LOW (ref 3.5–5.0)
Anion gap: 8 (ref 5–15)
BUN: 67 mg/dL — ABNORMAL HIGH (ref 6–20)
CHLORIDE: 110 mmol/L (ref 101–111)
CO2: 19 mmol/L — AB (ref 22–32)
Calcium: 7.7 mg/dL — ABNORMAL LOW (ref 8.9–10.3)
Creatinine, Ser: 4.39 mg/dL — ABNORMAL HIGH (ref 0.61–1.24)
GFR, EST AFRICAN AMERICAN: 15 mL/min — AB (ref >60)
GFR, EST NON AFRICAN AMERICAN: 13 mL/min — AB (ref >60)
Glucose, Bld: 97 mg/dL (ref 65–99)
POTASSIUM: 4.9 mmol/L (ref 3.5–5.1)
Phosphorus: 5.3 mg/dL — ABNORMAL HIGH (ref 2.5–4.6)
Sodium: 137 mmol/L (ref 135–145)

## 2016-10-16 MED ORDER — ALUM & MAG HYDROXIDE-SIMETH 200-200-20 MG/5ML PO SUSP
30.0000 mL | Freq: Four times a day (QID) | ORAL | Status: DC | PRN
  Administered 2016-10-16 – 2016-10-18 (×3): 30 mL via ORAL
  Filled 2016-10-16 (×4): qty 30

## 2016-10-16 NOTE — Consult Note (Signed)
Pharmacy Antibiotic Note  Brandon Maynard. is a 60 y.o. male admitted on 10/04/2016 with bacteremia, cellulitis and UTI.  Pharmacy has been consulted for vancomycin and meropenem dosing. Patient was treated with one dose of vanc and zosyn on 11/6. meropenem from 11/7-11/9 and keflex from 11/1- present. Patient has ecoli bacteremia and UTI (13, 000 colonies).   ID narrowing abx to cefazolin.   Plan: Will Continue cefazolin 1 g iv q 24 hours. Patient receiving intermittent hemodialysis as needed by nephrology; therefore will dose as if patient's Crcl is <10 ml/min (AKI).      Height: 6\' 3"  (190.5 cm) Weight: 202 lb (91.6 kg) IBW/kg (Calculated) : 84.5  Temp (24hrs), Avg:98.8 F (37.1 C), Min:97.9 F (36.6 C), Max:99.7 F (37.6 C)   Recent Labs Lab 10/11/16 0809  10/12/16 0425 10/13/16 0554 10/13/16 1400 10/14/16 0604 10/15/16 0534 10/16/16 0704  WBC 12.9*  --   --   --  13.1* 10.9* 12.1*  --   CREATININE  --   < > 4.26* 4.63*  --  3.72* 4.13* 4.39*  < > = values in this interval not displayed.  Estimated Creatinine Clearance: 20.9 mL/min (by C-G formula based on SCr of 4.39 mg/dL (H)).    Allergies  Allergen Reactions  . Tetracyclines & Related Hives    Antimicrobials this admission: vancomycin 11/6 >> 11/6, 11/12>> 11/13 zosyn 11/6 >> 11/6 Meropenem 11/7>> 11/9, 11/12>>11/13 CTX 11/9 >> 11/10 Keflex 11/10>>11/12 Cefazolin 11/13 >>  Dose adjustments this admission:   Microbiology results: 11/15 BCx x2 sent 11/13 WCx: NGTD 11/6 BCx: ecoli x2 11/6 UCx: 30,000 EColi 11/6 MRSA PCR: neg  Thank you for allowing pharmacy to be a part of this patient's care.  Kayliee Atienza D 10/16/2016 11:56 AM

## 2016-10-16 NOTE — Progress Notes (Signed)
[facility]  ROUNDING NOTE   Subjective:   No complaints.  UOP 4675 Creatinine 4.39 (4.13)  Objective:  Vital signs in last 24 hours:  Temp:  [97.9 F (36.6 C)-99.7 F (37.6 C)] 98.9 F (37.2 C) (11/18 0448) Pulse Rate:  [70-83] 78 (11/18 0448) Resp:  [16-20] 18 (11/18 0448) BP: (92-113)/(63-74) 92/74 (11/18 0448) SpO2:  [98 %-100 %] 100 % (11/18 0448)  Weight change:  Filed Weights   10/13/16 1350 10/13/16 1705 10/14/16 1504  Weight: 91.7 kg (202 lb 2.6 oz) 91.7 kg (202 lb 2.6 oz) 91.6 kg (202 lb)    Intake/Output: I/O last 3 completed shifts: In: 2031.7 [P.O.:1277; NG/GT:480; IV Piggyback:274.7] Out: 6575 [Urine:6575]   Intake/Output this shift:  Total I/O In: 477 [P.O.:477] Out: 400 [Urine:400]  Physical Exam: General: Laying in bed  Head: Normocephalic, atraumatic. Moist oral mucosal membranes  Eyes: Anicteric  Neck: Supple, trachea midline  Lungs:  Clear to auscultation, normal effort  Heart: S1S2 no rubs  Abdomen:  Soft, nontender, BS present  Extremities: trace edema left lower extremity, +erythema and clean dressings.   Neurologic: Awake, follows commands  Skin: Left foot induration  Access:  Right IJ permcath 11/16 Dr. Lucky Cowboy    Basic Metabolic Panel:  Recent Labs Lab 10/11/16 1149 10/12/16 0425 10/13/16 0554 10/14/16 0604 10/15/16 0534 10/16/16 0704  NA 143 144 147* 141 140 137  K 4.4 4.9 5.3* 5.0 4.8 4.9  CL 111 114* 118* 108 110 110  CO2 24 22 21* 25 24 19*  GLUCOSE 113* 103* 98 111* 94 97  BUN 70* 74* 76* 55* 65* 67*  CREATININE 3.96* 4.26* 4.63* 3.72* 4.13* 4.39*  CALCIUM 7.7* 7.8* 7.9* 7.4* 7.8* 7.7*  PHOS 5.7* 5.7* 6.8*  --  5.6* 5.3*    Liver Function Tests:  Recent Labs Lab 10/11/16 1149 10/12/16 0425 10/13/16 0554 10/15/16 0534 10/16/16 0704  ALBUMIN 1.3* 1.3* 1.4* 1.4* 1.3*   No results for input(s): LIPASE, AMYLASE in the last 168 hours. No results for input(s): AMMONIA in the last 168  hours.  CBC:  Recent Labs Lab 10/11/16 0809 10/13/16 1400 10/14/16 0604 10/14/16 1912 10/15/16 0534  WBC 12.9* 13.1* 10.9*  --  12.1*  NEUTROABS 10.1*  --   --   --   --   HGB 8.1* 7.6* 6.3* 8.3* 7.6*  HCT 23.8* 23.7* 18.7* 24.9* 23.0*  MCV 88.8 92.9 91.6  --  92.5  PLT 72* 136* 110*  --  123*    Cardiac Enzymes: No results for input(s): CKTOTAL, CKMB, CKMBINDEX, TROPONINI in the last 168 hours.  BNP: Invalid input(s): POCBNP  CBG: No results for input(s): GLUCAP in the last 168 hours.  Microbiology: Results for orders placed or performed during the hospital encounter of 10/04/16  Blood Culture (routine x 2)     Status: Abnormal   Collection Time: 10/04/16  3:51 PM  Result Value Ref Range Status   Specimen Description BLOOD RT [facility]  Final   Special Requests BOTTLES DRAWN AEROBIC AND ANAEROBIC 10CC  Final   Culture  Setup Time   Final    Organism ID to follow GRAM NEGATIVE RODS IN BOTH AEROBIC AND ANAEROBIC BOTTLES CRITICAL RESULT CALLED TO, READ BACK BY AND VERIFIED WITH: NATE COOKSON ON 10/05/16 AT 0448 BY TLB CONFIRMED BY TLB    Culture ESCHERICHIA COLI (A)  Final   Report Status 10/07/2016 FINAL  Final   Organism ID, Bacteria ESCHERICHIA COLI  Final  Susceptibility   Escherichia coli - MIC*    AMPICILLIN >=32 RESISTANT Resistant     CEFAZOLIN <=4 SENSITIVE Sensitive     CEFEPIME <=1 SENSITIVE Sensitive     CEFTAZIDIME <=1 SENSITIVE Sensitive     CEFTRIAXONE <=1 SENSITIVE Sensitive     CIPROFLOXACIN <=0.25 SENSITIVE Sensitive     GENTAMICIN <=1 SENSITIVE Sensitive     IMIPENEM <=0.25 SENSITIVE Sensitive     TRIMETH/SULFA >=320 RESISTANT Resistant     AMPICILLIN/SULBACTAM 16 INTERMEDIATE Intermediate     PIP/TAZO <=4 SENSITIVE Sensitive     Extended ESBL NEGATIVE Sensitive     * ESCHERICHIA COLI  Blood Culture ID Panel (Reflexed)     Status: Abnormal   Collection Time: 10/04/16  3:51 PM  Result Value Ref Range Status   Enterococcus species NOT DETECTED  NOT DETECTED Final   Listeria monocytogenes NOT DETECTED NOT DETECTED Final   Staphylococcus species NOT DETECTED NOT DETECTED Final   Staphylococcus aureus NOT DETECTED NOT DETECTED Final   Streptococcus species NOT DETECTED NOT DETECTED Final   Streptococcus agalactiae NOT DETECTED NOT DETECTED Final   Streptococcus pneumoniae NOT DETECTED NOT DETECTED Final   Streptococcus pyogenes NOT DETECTED NOT DETECTED Final   Acinetobacter baumannii NOT DETECTED NOT DETECTED Final   Enterobacteriaceae species DETECTED (A) NOT DETECTED Final    Comment: CRITICAL RESULT CALLED TO, READ BACK BY AND VERIFIED WITH: NATE COOKSON ON 10/05/16 AT 0448 BY TLB    Enterobacter cloacae complex NOT DETECTED NOT DETECTED Final   Escherichia coli DETECTED (A) NOT DETECTED Final    Comment: CRITICAL RESULT CALLED TO, READ BACK BY AND VERIFIED WITH: NATE COOKSON ON 10/05/16 AT 0448 BY TLB                                                                                                Klebsiella oxytoca NOT DETECTED NOT DETECTED Final   Klebsiella pneumoniae NOT DETECTED NOT DETECTED Final   Proteus species NOT DETECTED NOT DETECTED Final   Serratia marcescens NOT DETECTED NOT DETECTED Final   Carbapenem resistance NOT DETECTED NOT DETECTED Final   Haemophilus influenzae NOT DETECTED NOT DETECTED Final   Neisseria meningitidis NOT DETECTED NOT DETECTED Final   Pseudomonas aeruginosa NOT DETECTED NOT DETECTED Final   Candida albicans NOT DETECTED NOT DETECTED Final   Candida glabrata NOT DETECTED NOT DETECTED Final   Candida krusei NOT DETECTED NOT DETECTED Final   Candida parapsilosis NOT DETECTED NOT DETECTED Final   Candida tropicalis NOT DETECTED NOT DETECTED Final  Blood Culture (routine x 2)     Status: Abnormal   Collection Time: 10/04/16  3:56 PM  Result Value Ref Range Status   Specimen Description BLOOD LT [facility]  Final   Special Requests BOTTLES DRAWN AEROBIC AND ANAEROBIC 10CC  Final   Culture  Setup  Time   Final    GRAM NEGATIVE RODS IN BOTH AEROBIC AND ANAEROBIC BOTTLES CRITICAL RESULT CALLED TO, READ BACK BY AND VERIFIED WITH: NATE COOKSON ON 10/05/16 AT 0448 BY TLB CONFIRMED BY TLB    Culture ESCHERICHIA COLI (A)  Final  Report Status 10/07/2016 FINAL  Final  Urine culture     Status: Abnormal   Collection Time: 10/04/16  9:01 PM  Result Value Ref Range Status   Specimen Description URINE, RANDOM  Final   Special Requests NONE  Final   Culture 30,000 COLONIES/mL ESCHERICHIA COLI (A)  Final   Report Status 10/07/2016 FINAL  Final   Organism ID, Bacteria ESCHERICHIA COLI (A)  Final      Susceptibility   Escherichia coli - MIC*    AMPICILLIN >=32 RESISTANT Resistant     CEFAZOLIN <=4 SENSITIVE Sensitive     CEFTRIAXONE <=1 SENSITIVE Sensitive     CIPROFLOXACIN <=0.25 SENSITIVE Sensitive     GENTAMICIN <=1 SENSITIVE Sensitive     IMIPENEM <=0.25 SENSITIVE Sensitive     NITROFURANTOIN <=16 SENSITIVE Sensitive     TRIMETH/SULFA >=320 RESISTANT Resistant     AMPICILLIN/SULBACTAM 16 INTERMEDIATE Intermediate     PIP/TAZO <=4 SENSITIVE Sensitive     Extended ESBL NEGATIVE Sensitive     * 30,000 COLONIES/mL ESCHERICHIA COLI  MRSA PCR Screening     Status: None   Collection Time: 10/04/16  9:01 PM  Result Value Ref Range Status   MRSA by PCR NEGATIVE NEGATIVE Final    Comment:        The GeneXpert MRSA Assay (FDA approved for NASAL specimens only), is one component of a comprehensive MRSA colonization surveillance program. It is not intended to diagnose MRSA infection nor to guide or monitor treatment for MRSA infections.   MRSA PCR Screening     Status: None   Collection Time: 10/06/16 11:59 AM  Result Value Ref Range Status   MRSA by PCR NEGATIVE NEGATIVE Final    Comment:        The GeneXpert MRSA Assay (FDA approved for NASAL specimens only), is one component of a comprehensive MRSA colonization surveillance program. It is not intended to diagnose  MRSA infection nor to guide or monitor treatment for MRSA infections.   Aerobic Culture (superficial specimen)     Status: None   Collection Time: 10/11/16  4:03 PM  Result Value Ref Range Status   Specimen Description LEG  Final   Special Requests Normal  Final   Gram Stain NO WBC SEEN NO ORGANISMS SEEN   Final   Culture   Final    NO GROWTH 3 DAYS Performed at [facility]    Report Status 10/14/2016 FINAL  Final  CULTURE, BLOOD (ROUTINE X 2) w Reflex to ID Panel     Status: None (Preliminary result)   Collection Time: 10/13/16  9:33 AM  Result Value Ref Range Status   Specimen Description BLOOD RIGHT HAND  Final   Special Requests   Final    BOTTLES DRAWN AEROBIC AND ANAEROBIC 8CCAERO,10CCANA   Culture NO GROWTH 2 DAYS  Final   Report Status PENDING  Incomplete  CULTURE, BLOOD (ROUTINE X 2) w Reflex to ID Panel     Status: None (Preliminary result)   Collection Time: 10/13/16  9:40 AM  Result Value Ref Range Status   Specimen Description BLOOD LEFT ASSIST CONTROL  Final   Special Requests   Final    BOTTLES DRAWN AEROBIC AND ANAEROBIC 10CCAERO,5CCANA   Culture NO GROWTH 2 DAYS  Final   Report Status PENDING  Incomplete    Coagulation Studies: No results for input(s): LABPROT, INR in the last 72 hours.  Urinalysis: No results for input(s): COLORURINE, LABSPEC, PHURINE, GLUCOSEU, HGBUR, BILIRUBINUR, KETONESUR, PROTEINUR,  UROBILINOGEN, NITRITE, LEUKOCYTESUR in the last 72 hours.  Invalid input(s): APPERANCEUR    Imaging: No results found.   Medications:    . asenapine  10 mg Sublingual BID  . brimonidine  1 drop Both Eyes Q12H   And  . timolol  1 drop Both Eyes Q12H  .  ceFAZolin (ANCEF) IV  1 g Intravenous q1800  . cefUROXime (ZINACEF)  IV  1.5 g Intravenous Q8H  . docusate sodium  100 mg Oral BID  . feeding supplement (NEPRO CARB STEADY)  237 mL Oral BID BM  . FLUoxetine  40 mg Oral Daily  . latanoprost  1 drop Both Eyes QHS  . levothyroxine  50  mcg Oral QAC breakfast  . multivitamin with minerals  1 tablet Oral Daily  . pyridOXINE  100 mg Oral BID  . silver sulfADIAZINE   Topical BID  . simvastatin  20 mg Oral QHS  . traZODone  100 mg Oral QHS   sodium chloride, sodium chloride, acetaminophen, alteplase, haloperidol lactate, heparin, lidocaine (PF), lidocaine-prilocaine, ondansetron **OR** ondansetron (ZOFRAN) IV, oxyCODONE, pentafluoroprop-tetrafluoroeth  Assessment/ Plan:  60 y.o. white male with past medication history of anemia, bipolar disorder, degenerative disc disease, GERD, hyperlipidemia, hypertension, paranoid schizophrenia, who was admitted to [facility] on 10/04/2016 for evaluation of left leg swelling and redness.   1.  Acute renal failure on chronic kidney disease stage III: baseline creatinine of 2.1 EGFR 32 with proteinuria Acute renal failure secondary to sepsis, hypotension and ATN Requiring renal replacement. Two dialysis treatments 11/11 and 11/12.  Nonoliguric urine output. However creatinine continues to rise Chronic kidney disease secondary to hypertension  - Last Hemodialysis 11/15 due to hyperkalemia, and metabolic acidosis.  - Will need a 24 hour collection  2.  Hypotension: blood pressure has improved. Secondary to sepsis.  Takes labetalol at home. Hold all blood pressure agents for now.   3.  Left lower extremity cellulitis: with sepsis. E coli from blood cultures 11/6  Leukocytosis improving. Afebrile - cefazolin and now also on cefuroxime: will need to have this clarified.  - repeat blood cultures negative.  - Appreciate vascular input.   4. Anemia and thrombocytopenia: hemoglobin low. Platelets improved off valproic acid Hematology consulted on 11/9 PRBC transfusion on 11/16   LOS: [place], [place] 11/18/201711:38 AM

## 2016-10-16 NOTE — Progress Notes (Signed)
Brandon Maynard NAME: Brandon Maynard    MR#:  [id number]  DATE OF BIRTH:  Aug 30, 1956  SUBJECTIVE: pt  denies any complaints.   Came in with left leg swelling and erythema Now on HD  REVIEW OF SYSTEMS:   Review of Systems  Constitutional: Negative for chills, fever and weight loss.  HENT: Negative for ear discharge, ear pain and nosebleeds.   Eyes: Negative for blurred vision, pain and discharge.  Respiratory: Negative for sputum production, shortness of breath, wheezing and stridor.   Cardiovascular: Positive for leg swelling. Negative for chest pain, palpitations, orthopnea and PND.  Gastrointestinal: Negative for abdominal pain, diarrhea, nausea and vomiting.  Genitourinary: Negative for frequency and urgency.  Musculoskeletal: Positive for joint pain. Negative for back pain.  Neurological: Positive for weakness. Negative for sensory change, speech change and focal weakness.  Psychiatric/Behavioral: Negative for depression and hallucinations. The patient is not nervous/anxious.    Tolerating Diet:yes  DRUG ALLERGIES:   Allergies  Allergen Reactions  . Tetracyclines & Related Hives    VITALS:  Blood pressure 92/74, pulse 78, temperature 98.9 F (37.2 C), temperature source Oral, resp. rate 18, height 6\' 3"  (1.905 m), weight 91.6 kg (202 lb), SpO2 100 %.  PHYSICAL EXAMINATION:   Physical Exam  GENERAL:  60 y.o.-year-old patient lying in the bed with no acute distress.  EYES: Pupils equal, round, reactive to light and accommodation. No scleral icterus. Extraocular muscles intact.  HEENT: Head atraumatic, normocephalic. Oropharynx and nasopharynx clear.  NECK:  Supple, no jugular venous distention. No thyroid enlargement, no tenderness.  LUNGS: Normal breath sounds bilaterally, no wheezing, rales, rhonchi. No use of accessory muscles of respiration.  CARDIOVASCULAR: S1, S2 normal. No murmurs, rubs, or gallops.  ABDOMEN:  Soft, nontender, nondistended. Bowel sounds present. No organomegaly or mass.  EXTREMITIES: dressing  Present for left foot. NEUROLOGIC;Alert,awake,oriented during my visit. PSYCHIATRIC: Alert,awake,oriented,  LABORATORY PANEL:  CBC  Recent Labs Lab 10/15/16 0534  WBC 12.1*  HGB 7.6*  HCT 23.0*  PLT 123*    Chemistries   Recent Labs Lab 10/16/16 0704  NA 137  K 4.9  CL 110  CO2 19*  GLUCOSE 97  BUN 67*  CREATININE 4.39*  CALCIUM 7.7*   Cardiac Enzymes No results for input(s): TROPONINI in the last 168 hours. RADIOLOGY:  No results found. ASSESSMENT AND PLAN:   Brandon Maynard  is a 60 y.o. male with a known history of Bipolar disorder, cataract, chronic thrombocytopenia is presenting to the ED with a chief complaint of significant worsening of left lower extremity pain, redness and swelling which started with a small blister on the left foot. Denies any insect bites  #Sepsis meets criteria with leukocytosis, elevated lactic acid and hypotension. Secondary to left leg cellulitis On   antibiotics  Ancef, patient has Escherichia coli bacteremia . WBC improved from admission, now down to 12.9. Previous MRSA before. Elevated the leg. -Vascular surgery eval noted. Brandon Maynard does not feel any need for intervention for PAD. CT tibia-fibula shows inflammation and swelling. No loculated collection of fluid. rpt blood cultures from 11/15 negative,wound cultures are negative.  #Delirium. Patient has schizophrenia as per medical records. 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 With nephrology today, patient is making urine, now not decided about future dialysis needs, patient will be here  for  Some time  because of his the fluctuating kidney function and the ongoing need for hemodialysis on and  off.  #Hyponatremia Resolved  #Thrombocytopenia- improving.  -plt 42K---18K--22k -multifactorial. Pt has been evaluated by Oncology in the past and appears due to meds/sepsis and CKD -Depakote has been discontinued by Brandon clapacs  # DVT prophylaxis with SCDs  # Paranoid schizophrenia/ Bipolar disorder - Seen by Brandon Maynard  Acute on chronic anemia;;s/p  One unit blood transfusion.  Case discussed with Care Management/Social Worker. Management plans discussed with the patient, family and they are in agreement.  CODE STATUS:FULL  DVT Prophylaxis: SCD  TOTAL TIME TAKING CARE OF THIS PATIENT 35 minutes.   Note: This dictation was prepared with Dragon dictation along with smaller phrase technology. Any transcriptional errors that result from this process are unintentional.  Brandon Maynard M.D on 10/16/2016 at 10:53 AM  Between 7am to 6pm - Pager - [phone]  After 6pm go to [email or web address] - password [email or web address] [facility]  [facility] [facility]  Office  [phone]  CC: Primary care physician; Brandon Miu, MD

## 2016-10-17 ENCOUNTER — Inpatient Hospital Stay: Payer: Medicare Other

## 2016-10-17 LAB — RENAL FUNCTION PANEL
ANION GAP: 6 (ref 5–15)
Albumin: 1.5 g/dL — ABNORMAL LOW (ref 3.5–5.0)
BUN: 75 mg/dL — ABNORMAL HIGH (ref 6–20)
CHLORIDE: 111 mmol/L (ref 101–111)
CO2: 20 mmol/L — AB (ref 22–32)
Calcium: 7.8 mg/dL — ABNORMAL LOW (ref 8.9–10.3)
Creatinine, Ser: 4.61 mg/dL — ABNORMAL HIGH (ref 0.61–1.24)
GFR calc Af Amer: 14 mL/min — ABNORMAL LOW (ref >60)
GFR calc non Af Amer: 12 mL/min — ABNORMAL LOW (ref >60)
GLUCOSE: 121 mg/dL — AB (ref 65–99)
PHOSPHORUS: 4.2 mg/dL (ref 2.5–4.6)
POTASSIUM: 4.8 mmol/L (ref 3.5–5.1)
Sodium: 137 mmol/L (ref 135–145)

## 2016-10-17 NOTE — Progress Notes (Signed)
[place] [facility] [place] NAME: Brandon Maynard    MR#:  [id number]  DATE OF BIRTH:  [birth year]  SUBJECTIVE: Patient denies any complaints.   Came in with left leg swelling and erythema Now on HD  REVIEW OF SYSTEMS:   Review of Systems  Constitutional: Negative for chills, fever and weight loss.  HENT: Negative for ear discharge, ear pain and nosebleeds.   Eyes: Negative for blurred vision, pain and discharge.  Respiratory: Negative for sputum production, shortness of breath, wheezing and stridor.   Cardiovascular: Negative for chest pain, palpitations, orthopnea, leg swelling and PND.  Gastrointestinal: Negative for abdominal pain, diarrhea, nausea and vomiting.  Genitourinary: Negative for frequency and urgency.  Musculoskeletal: Negative for back pain and joint pain.  Neurological: Negative for sensory change, speech change, focal weakness and weakness.  Psychiatric/Behavioral: Negative for depression and hallucinations. The patient is not nervous/anxious.    Tolerating Diet:yes  DRUG ALLERGIES:   Allergies  Allergen Reactions  . Tetracyclines & Related Hives    VITALS:  Blood pressure (!) 112/54, pulse 78, temperature 99.1 F (37.3 C), temperature source Oral, resp. rate 18, height 6\' 3"  (1.905 m), weight 91.6 kg (202 lb), SpO2 100 %.  PHYSICAL EXAMINATION:   Physical Exam  GENERAL:  60 y.o.-year-old patient lying in the bed with no acute distress.  EYES: Pupils equal, round, reactive to light and accommodation. No scleral icterus. Extraocular muscles intact.  HEENT: Head atraumatic, normocephalic. Oropharynx and nasopharynx clear.  NECK:  Supple, no jugular venous distention. No thyroid enlargement, no tenderness.  LUNGS: Normal breath sounds bilaterally, no wheezing, rales, rhonchi. No use of accessory muscles of respiration.  CARDIOVASCULAR: S1, S2 normal. No murmurs, rubs, or gallops.  ABDOMEN: Soft, nontender, nondistended.  Bowel sounds present. No organomegaly or mass.  EXTREMITIES: dressing  Present for left foot. NEUROLOGIC;Alert,awake,oriented during my visit. PSYCHIATRIC: Alert,awake,oriented,  LABORATORY PANEL:  CBC  Recent Labs Lab 10/15/16 0534  WBC 12.1*  HGB 7.6*  HCT 23.0*  PLT 123*    Chemistries   Recent Labs Lab 10/17/16 0549  NA 137  K 4.8  CL 111  CO2 20*  GLUCOSE 121*  BUN 75*  CREATININE 4.61*  CALCIUM 7.8*   Cardiac Enzymes No results for input(s): TROPONINI in the last 168 hours. RADIOLOGY:  No results found. ASSESSMENT AND PLAN:   Brandon Maynard  is a 60 y.o. male with a known history of Bipolar disorder, cataract, chronic thrombocytopenia is presenting to the ED with a chief complaint of significant worsening of left lower extremity pain, redness and swelling which started with a small blister on the left foot. Denies any insect bites  #Sepsis meets criteria with leukocytosis, elevated lactic acid and hypotension. Secondary to left leg cellulitis On   antibiotics  Ancef, patient has Escherichia coli bacteremia . WBC improved from admission, now down to 12.9. Previous MRSA before. Elevated the leg. -Vascular surgery eval noted. Dr Ronalee Belts does not feel any need for intervention for PAD. CT tibia-fibula shows inflammation and swelling. No loculated collection of fluid. rpt blood cultures from 11/15 negative,wound cultures are negative. As per ID patient needs total of 14 days Ancef, changed to Keflex for another week.   #Delirium. Patient has schizophrenia as per medical records. 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 . With nephrology today, patient is making urine, now not decided about future dialysis needs, patient will be here   for  Some time  because  of his the fluctuating kidney function and the ongoing need for hemodialysis on and off.  #Hyponatremia Resolved  #Thrombocytopenia- improving.  -plt 42K---18K--22k -multifactorial. Pt has been evaluated by Oncology in the past and appears due to meds/sepsis and CKD -Depakote has been discontinued by Dr clapacs  # DVT prophylaxis with SCDs  # Paranoid schizophrenia/ Bipolar disorder - Seen by Dr Weber Cooks  Acute on chronic anemia;;s/p  One unit blood transfusion.  Case discussed with Care Management/Social Worker. Management plans discussed with the patient, family and they are in agreement.  CODE STATUS:FULL  DVT Prophylaxis: SCD  TOTAL TIME TAKING CARE OF THIS PATIENT 35 minutes.   Note: This dictation was prepared with Dragon dictation along with smaller phrase technology. Any transcriptional errors that result from this process are unintentional.  Brandon Maynard M.D on 10/17/2016 at 10:29 AM  Between 7am to 6pm - Pager - [phone]  After 6pm go to [email or web address] - password [email or web address]  [place] [facility]  Office  [phone]  CC: Primary care physician; [name], MD

## 2016-10-17 NOTE — Progress Notes (Signed)
[facility]  ROUNDING NOTE   Subjective:   Apparently without IV, using trialysis port.   UOP 3250 (4675) Creatinine 4.61 (4.39) (4.13)  Objective:  Vital signs in last 24 hours:  Temp:  [97.9 F (36.6 C)-99.1 F (37.3 C)] 99.1 F (37.3 C) (11/19 0553) Pulse Rate:  [75-78] 78 (11/19 0553) Resp:  [17-20] 18 (11/19 0553) BP: (101-117)/(54-61) 112/54 (11/19 0553) SpO2:  [100 %] 100 % (11/19 0553)  Weight change:  Filed Weights   10/13/16 1350 10/13/16 1705 10/14/16 1504  Weight: 91.7 kg (202 lb 2.6 oz) 91.7 kg (202 lb 2.6 oz) 91.6 kg (202 lb)    Intake/Output: I/O last 3 completed shifts: In: 1954 [P.O.:1854; IV Piggyback:100] Out: 5400 [Urine:5400]   Intake/Output this shift:  Total I/O In: 240 [P.O.:240] Out: 500 [Urine:500]  Physical Exam: General: Laying in bed  Head: Normocephalic, atraumatic. Moist oral mucosal membranes  Eyes: Anicteric  Neck: Supple, trachea midline  Lungs:  Clear to auscultation, normal effort  Heart: S1S2 no rubs  Abdomen:  Soft, nontender, BS present  Extremities: trace edema left lower extremity, +erythema and clean dressings.   Neurologic: Awake, follows commands  Skin: Left foot induration  Access:  Right IJ permcath 11/16 Dr. Lucky Cowboy, left femoral temp HD catheter 22/63    Basic Metabolic Panel:  Recent Labs Lab 10/12/16 0425 10/13/16 0554 10/14/16 0604 10/15/16 0534 10/16/16 0704 10/17/16 0549  NA 144 147* 141 140 137 137  K 4.9 5.3* 5.0 4.8 4.9 4.8  CL 114* 118* 108 110 110 111  CO2 22 21* 25 24 19* 20*  GLUCOSE 103* 98 111* 94 97 121*  BUN 74* 76* 55* 65* 67* 75*  CREATININE 4.26* 4.63* 3.72* 4.13* 4.39* 4.61*  CALCIUM 7.8* 7.9* 7.4* 7.8* 7.7* 7.8*  PHOS 5.7* 6.8*  --  5.6* 5.3* 4.2    Liver Function Tests:  Recent Labs Lab 10/12/16 0425 10/13/16 0554 10/15/16 0534 10/16/16 0704 10/17/16 0549  ALBUMIN 1.3* 1.4* 1.4* 1.3* 1.5*   No results for input(s): LIPASE, AMYLASE in the last 168 hours. No  results for input(s): AMMONIA in the last 168 hours.  CBC:  Recent Labs Lab 10/11/16 0809 10/13/16 1400 10/14/16 0604 10/14/16 1912 10/15/16 0534  WBC 12.9* 13.1* 10.9*  --  12.1*  NEUTROABS 10.1*  --   --   --   --   HGB 8.1* 7.6* 6.3* 8.3* 7.6*  HCT 23.8* 23.7* 18.7* 24.9* 23.0*  MCV 88.8 92.9 91.6  --  92.5  PLT 72* 136* 110*  --  123*    Cardiac Enzymes: No results for input(s): CKTOTAL, CKMB, CKMBINDEX, TROPONINI in the last 168 hours.  BNP: Invalid input(s): POCBNP  CBG: No results for input(s): GLUCAP in the last 168 hours.  Microbiology: Results for orders placed or performed during the hospital encounter of 10/04/16  Blood Culture (routine x 2)     Status: Abnormal   Collection Time: 10/04/16  3:51 PM  Result Value Ref Range Status   Specimen Description BLOOD RT [facility]  Final   Special Requests BOTTLES DRAWN AEROBIC AND ANAEROBIC 10CC  Final   Culture  Setup Time   Final    Organism ID to follow GRAM NEGATIVE RODS IN BOTH AEROBIC AND ANAEROBIC BOTTLES CRITICAL RESULT CALLED TO, READ BACK BY AND VERIFIED WITH: NATE COOKSON ON 10/05/16 AT 0448 BY TLB CONFIRMED BY TLB    Culture ESCHERICHIA COLI (A)  Final   Report Status 10/07/2016 FINAL  Final  Organism ID, Bacteria ESCHERICHIA COLI  Final      Susceptibility   Escherichia coli - MIC*    AMPICILLIN >=32 RESISTANT Resistant     CEFAZOLIN <=4 SENSITIVE Sensitive     CEFEPIME <=1 SENSITIVE Sensitive     CEFTAZIDIME <=1 SENSITIVE Sensitive     CEFTRIAXONE <=1 SENSITIVE Sensitive     CIPROFLOXACIN <=0.25 SENSITIVE Sensitive     GENTAMICIN <=1 SENSITIVE Sensitive     IMIPENEM <=0.25 SENSITIVE Sensitive     TRIMETH/SULFA >=320 RESISTANT Resistant     AMPICILLIN/SULBACTAM 16 INTERMEDIATE Intermediate     PIP/TAZO <=4 SENSITIVE Sensitive     Extended ESBL NEGATIVE Sensitive     * ESCHERICHIA COLI  Blood Culture ID Panel (Reflexed)     Status: Abnormal   Collection Time: 10/04/16  3:51 PM  Result Value Ref  Range Status   Enterococcus species NOT DETECTED NOT DETECTED Final   Listeria monocytogenes NOT DETECTED NOT DETECTED Final   Staphylococcus species NOT DETECTED NOT DETECTED Final   Staphylococcus aureus NOT DETECTED NOT DETECTED Final   Streptococcus species NOT DETECTED NOT DETECTED Final   Streptococcus agalactiae NOT DETECTED NOT DETECTED Final   Streptococcus pneumoniae NOT DETECTED NOT DETECTED Final   Streptococcus pyogenes NOT DETECTED NOT DETECTED Final   Acinetobacter baumannii NOT DETECTED NOT DETECTED Final   Enterobacteriaceae species DETECTED (A) NOT DETECTED Final    Comment: CRITICAL RESULT CALLED TO, READ BACK BY AND VERIFIED WITH: NATE COOKSON ON 10/05/16 AT 0448 BY TLB    Enterobacter cloacae complex NOT DETECTED NOT DETECTED Final   Escherichia coli DETECTED (A) NOT DETECTED Final    Comment: CRITICAL RESULT CALLED TO, READ BACK BY AND VERIFIED WITH: NATE COOKSON ON 10/05/16 AT 0448 BY TLB                                                                                                Klebsiella oxytoca NOT DETECTED NOT DETECTED Final   Klebsiella pneumoniae NOT DETECTED NOT DETECTED Final   Proteus species NOT DETECTED NOT DETECTED Final   Serratia marcescens NOT DETECTED NOT DETECTED Final   Carbapenem resistance NOT DETECTED NOT DETECTED Final   Haemophilus influenzae NOT DETECTED NOT DETECTED Final   Neisseria meningitidis NOT DETECTED NOT DETECTED Final   Pseudomonas aeruginosa NOT DETECTED NOT DETECTED Final   Candida albicans NOT DETECTED NOT DETECTED Final   Candida glabrata NOT DETECTED NOT DETECTED Final   Candida krusei NOT DETECTED NOT DETECTED Final   Candida parapsilosis NOT DETECTED NOT DETECTED Final   Candida tropicalis NOT DETECTED NOT DETECTED Final  Blood Culture (routine x 2)     Status: Abnormal   Collection Time: 10/04/16  3:56 PM  Result Value Ref Range Status   Specimen Description BLOOD LT [facility]  Final   Special Requests BOTTLES DRAWN  AEROBIC AND ANAEROBIC 10CC  Final   Culture  Setup Time   Final    GRAM NEGATIVE RODS IN BOTH AEROBIC AND ANAEROBIC BOTTLES CRITICAL RESULT CALLED TO, READ BACK BY AND VERIFIED WITH: NATE COOKSON ON 10/05/16 AT 0448 BY TLB CONFIRMED  BY TLB    Culture ESCHERICHIA COLI (A)  Final   Report Status 10/07/2016 FINAL  Final  Urine culture     Status: Abnormal   Collection Time: 10/04/16  9:01 PM  Result Value Ref Range Status   Specimen Description URINE, RANDOM  Final   Special Requests NONE  Final   Culture 30,000 COLONIES/mL ESCHERICHIA COLI (A)  Final   Report Status 10/07/2016 FINAL  Final   Organism ID, Bacteria ESCHERICHIA COLI (A)  Final      Susceptibility   Escherichia coli - MIC*    AMPICILLIN >=32 RESISTANT Resistant     CEFAZOLIN <=4 SENSITIVE Sensitive     CEFTRIAXONE <=1 SENSITIVE Sensitive     CIPROFLOXACIN <=0.25 SENSITIVE Sensitive     GENTAMICIN <=1 SENSITIVE Sensitive     IMIPENEM <=0.25 SENSITIVE Sensitive     NITROFURANTOIN <=16 SENSITIVE Sensitive     TRIMETH/SULFA >=320 RESISTANT Resistant     AMPICILLIN/SULBACTAM 16 INTERMEDIATE Intermediate     PIP/TAZO <=4 SENSITIVE Sensitive     Extended ESBL NEGATIVE Sensitive     * 30,000 COLONIES/mL ESCHERICHIA COLI  MRSA PCR Screening     Status: None   Collection Time: 10/04/16  9:01 PM  Result Value Ref Range Status   MRSA by PCR NEGATIVE NEGATIVE Final    Comment:        The GeneXpert MRSA Assay (FDA approved for NASAL specimens only), is one component of a comprehensive MRSA colonization surveillance program. It is not intended to diagnose MRSA infection nor to guide or monitor treatment for MRSA infections.   MRSA PCR Screening     Status: None   Collection Time: 10/06/16 11:59 AM  Result Value Ref Range Status   MRSA by PCR NEGATIVE NEGATIVE Final    Comment:        The GeneXpert MRSA Assay (FDA approved for NASAL specimens only), is one component of a comprehensive MRSA colonization surveillance  program. It is not intended to diagnose MRSA infection nor to guide or monitor treatment for MRSA infections.   Aerobic Culture (superficial specimen)     Status: None   Collection Time: 10/11/16  4:03 PM  Result Value Ref Range Status   Specimen Description LEG  Final   Special Requests Normal  Final   Gram Stain NO WBC SEEN NO ORGANISMS SEEN   Final   Culture   Final    NO GROWTH 3 DAYS Performed at [facility]    Report Status 10/14/2016 FINAL  Final  CULTURE, BLOOD (ROUTINE X 2) w Reflex to ID Panel     Status: None (Preliminary result)   Collection Time: 10/13/16  9:33 AM  Result Value Ref Range Status   Specimen Description BLOOD RIGHT HAND  Final   Special Requests   Final    BOTTLES DRAWN AEROBIC AND ANAEROBIC [place]   Culture NO GROWTH 4 DAYS  Final   Report Status PENDING  Incomplete  CULTURE, BLOOD (ROUTINE X 2) w Reflex to ID Panel     Status: None (Preliminary result)   Collection Time: 10/13/16  9:40 AM  Result Value Ref Range Status   Specimen Description BLOOD LEFT ASSIST CONTROL  Final   Special Requests   Final    BOTTLES DRAWN AEROBIC AND ANAEROBIC [place]   Culture NO GROWTH 4 DAYS  Final   Report Status PENDING  Incomplete    Coagulation Studies: No results for input(s): LABPROT, INR in the last 72 hours.  Urinalysis: No results for input(s): COLORURINE, LABSPEC, PHURINE, GLUCOSEU, HGBUR, BILIRUBINUR, KETONESUR, PROTEINUR, UROBILINOGEN, NITRITE, LEUKOCYTESUR in the last 72 hours.  Invalid input(s): APPERANCEUR    Imaging: No results found.   Medications:    . asenapine  10 mg Sublingual BID  . brimonidine  1 drop Both Eyes Q12H   And  . timolol  1 drop Both Eyes Q12H  .  ceFAZolin (ANCEF) IV  1 g Intravenous q1800  . docusate sodium  100 mg Oral BID  . feeding supplement (NEPRO CARB STEADY)  237 mL Oral BID BM  . FLUoxetine  40 mg Oral Daily  . latanoprost  1 drop Both Eyes QHS  . levothyroxine  50 mcg Oral  QAC breakfast  . multivitamin with minerals  1 tablet Oral Daily  . pyridOXINE  100 mg Oral BID  . silver sulfADIAZINE   Topical BID  . simvastatin  20 mg Oral QHS  . traZODone  100 mg Oral QHS   sodium chloride, sodium chloride, acetaminophen, alteplase, alum & mag hydroxide-simeth, haloperidol lactate, heparin, lidocaine (PF), lidocaine-prilocaine, ondansetron **OR** ondansetron (ZOFRAN) IV, oxyCODONE, pentafluoroprop-tetrafluoroeth  Assessment/ Plan:  60 y.o. white male with past medication history of anemia, bipolar disorder, degenerative disc disease, GERD, hyperlipidemia, hypertension, paranoid schizophrenia, who was admitted to [facility] on 10/04/2016 for evaluation of left leg swelling and redness.   1.  Acute renal failure on chronic kidney disease stage III: baseline creatinine of 2.1 EGFR 32 with proteinuria Acute renal failure secondary to sepsis, hypotension and ATN Requiring renal replacement. Two dialysis treatments 11/11 and 11/12.  Nonoliguric urine output. However creatinine continues to rise Chronic kidney disease secondary to hypertension  - Last Hemodialysis 11/15 due to hyperkalemia, and metabolic acidosis.  - Monitor daily for dialysis need.   2.  Hypotension: blood pressure has improved. Secondary to sepsis.  Takes labetalol at home. Hold all blood pressure agents for now.   3.  Left lower extremity cellulitis: with sepsis. E coli from blood cultures 11/6  Leukocytosis improving. Afebrile - cefazolin   - repeat blood cultures negative.  - Appreciate vascular input.   4. Anemia and thrombocytopenia: hemoglobin low. Platelets improved off valproic acid Hematology consulted on 11/9 PRBC transfusion on 11/16  5. Catheter: now with RIJ permcath.  - will ask to have trialysis catheter removed as this is a risk for infection (>7 days) and try for a central line today.    LOS: [place], [place] 11/19/201710:52 AM

## 2016-10-17 NOTE — Progress Notes (Signed)
Per MD, okay to get consent for IJ from brother.

## 2016-10-17 NOTE — Progress Notes (Signed)
R femoral trialysis catheter removed per MD order. Pt tolerated well.

## 2016-10-18 ENCOUNTER — Ambulatory Visit: Payer: Self-pay

## 2016-10-18 LAB — RENAL FUNCTION PANEL
Albumin: 1.5 g/dL — ABNORMAL LOW (ref 3.5–5.0)
Anion gap: 8 (ref 5–15)
BUN: 80 mg/dL — AB (ref 6–20)
CALCIUM: 8.1 mg/dL — AB (ref 8.9–10.3)
CO2: 19 mmol/L — AB (ref 22–32)
CREATININE: 4.71 mg/dL — AB (ref 0.61–1.24)
Chloride: 109 mmol/L (ref 101–111)
GFR calc non Af Amer: 12 mL/min — ABNORMAL LOW (ref >60)
GFR, EST AFRICAN AMERICAN: 14 mL/min — AB (ref >60)
GLUCOSE: 148 mg/dL — AB (ref 65–99)
Phosphorus: 4.9 mg/dL — ABNORMAL HIGH (ref 2.5–4.6)
Potassium: 4.7 mmol/L (ref 3.5–5.1)
SODIUM: 136 mmol/L (ref 135–145)

## 2016-10-18 LAB — CULTURE, BLOOD (ROUTINE X 2)
Culture: NO GROWTH
Culture: NO GROWTH

## 2016-10-18 MED ORDER — POLYETHYLENE GLYCOL 3350 17 G PO PACK
17.0000 g | PACK | Freq: Every day | ORAL | Status: DC
  Administered 2016-10-18 – 2016-10-22 (×4): 17 g via ORAL
  Filled 2016-10-18 (×5): qty 1

## 2016-10-18 MED ORDER — SENNA 8.6 MG PO TABS
1.0000 | ORAL_TABLET | Freq: Every day | ORAL | Status: DC
  Administered 2016-10-19 – 2016-10-22 (×3): 8.6 mg via ORAL
  Filled 2016-10-18 (×4): qty 1

## 2016-10-18 NOTE — Progress Notes (Signed)
Patient placed in recliner at 1700 on 10/18/2016. Stand-by 2 person assist.

## 2016-10-18 NOTE — Progress Notes (Deleted)
Foley inserted per order, patient tolerated well. 700 mL returned, yellow/cloudy urine.

## 2016-10-18 NOTE — Consult Note (Signed)
[facility] Consult   Reason for Consult:  Follow-up consult for 60 year old man with a history of chronic mental illness currently in the hospital with acute and complicated medical problems stemming from probably sepsis from a cellulitis and labile blood pressures. Updated problem as of today he is assessing capacity for decision-making Referring Physician:  Patel/Lateef Patient Identification: Fransisco T Kates Jr. MRN:  [id number] Principal Diagnosis: Acute delirium Diagnosis:   Patient Active Problem List   Diagnosis Date Noted  . Acute delirium [R41.0] 10/05/2016  . Left leg cellulitis [R51.884] 10/04/2016  . B12 deficiency [E53.8] 07/06/2016  . Anemia [D64.9] 06/16/2016  . Thrombocytopenia ([place]) [D69.6] 06/16/2016  . Weight loss [R63.4] 06/16/2016  . Esophageal reflux [K21.9] 05/12/2016  . 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 Schizoaffective disorder ([place]) [F25.9] 06/13/2014    Total Time spent with patient: 20 minutes  Subjective:   Gerik Coberly. is a 60 y.o. male patient admitted with "I'm not feeling good".  HPI:  Follow-up for Monday the 20th. Patient says he is feeling more or less okay. He says he's had overall a good day. Sounds like nursing is not convinced he's been irritable at times and acting out a little bit. On interview however he was not pressured and was fairly calm in his thinking. Wasn't acting in a particularly grandiose or agitated way. No threatening statements. Patient is continuing to be treated for a significant medical problems. Sounds like his kidneys may be continuing to improve a little bit.  Past Psychiatric History: Chronic schizophrenia or schizoaffective disorder. Recently we have discontinued his Depakote out of concern for it worsening his thrombocytopenia and medical problems. He is still on his antipsychotic.  Risk to Self: Is patient at risk for suicide?:  No Risk to Others:   Prior Inpatient Therapy:   Prior Outpatient Therapy:    Past Medical History:  Past Medical History:  Diagnosis Date  . Anemia   . Bipolar disorder ([place])   . Cancer ([place])   . Cataract    right eye  . DDD (degenerative disc disease)   . GERD (gastroesophageal reflux disease)   . Heart murmur   . Hyperlipidemia   . Hypertension   . Paranoid schizophrenia ([place])   . 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 Thyroid disease     Past Surgical History:  Procedure Laterality Date  . Dermal Abrasion  1973  . PERIPHERAL VASCULAR CATHETERIZATION N/A 10/14/2016   Procedure: Dialysis/Perma Catheter Insertion;  Surgeon: Algernon Huxley, MD;  Location: [facility];  Service: Cardiovascular;  Laterality: N/A;   Family History:  Family History  Problem Relation Age of Onset  . Brain cancer Maternal Uncle   . Breast cancer Mother   . Melanoma Mother   . Congestive Heart Failure Father   . Melanoma Father   . Breast cancer Maternal Aunt   . Diabetes Maternal Aunt    Family Psychiatric  History: No family history identified. Social History:  History  Alcohol Use No     History  Drug Use No    Social History   Social History  . Marital status: Single    Spouse name: N/A  . Number of children: N/A  . Years of education: N/A   Social History Main Topics  . Smoking status: Current Every Day Smoker    Last attempt to quit: 04/09/1991  . Smokeless tobacco: Never Used  . Alcohol use No  .  Drug use: No  . Sexual activity: Not Asked   Other Topics Concern  . None   Social History Narrative  . None   Additional Social History:Patient's brother, who lives in [place], is the only relative actively involved in making decisions. I am told by nephrology that the brother has been contacted and expresses agreement with proceeding with dialysis. Unfortunately he has not been made the healthcare power of attorney or guardian at this point.    Allergies:   Allergies   Allergen Reactions  . Tetracyclines & Related Hives    Labs:  Results for orders placed or performed during the hospital encounter of 10/04/16 (from the past 48 hour(s))  Renal function panel     Status: Abnormal   Collection Time: 10/17/16  5:49 AM  Result Value Ref Range   Sodium 137 135 - 145 mmol/L   Potassium 4.8 3.5 - 5.1 mmol/L   Chloride 111 101 - 111 mmol/L   CO2 20 (L) 22 - 32 mmol/L   Glucose, Bld 121 (H) 65 - 99 mg/dL   BUN 75 (H) 6 - 20 mg/dL   Creatinine, Ser 4.61 (H) 0.61 - 1.24 mg/dL   Calcium 7.8 (L) 8.9 - 10.3 mg/dL   Phosphorus 4.2 2.5 - 4.6 mg/dL   Albumin 1.5 (L) 3.5 - 5.0 g/dL   GFR calc non Af Amer 12 (L) >60 mL/min   GFR calc Af Amer 14 (L) >60 mL/min    Comment: (NOTE) The eGFR has been calculated using the CKD EPI equation. This calculation has not been validated in all clinical situations. eGFR's persistently <60 mL/min signify possible Chronic Kidney Disease.    Anion gap 6 5 - 15  Renal function panel     Status: Abnormal   Collection Time: 10/18/16  9:55 AM  Result Value Ref Range   Sodium 136 135 - 145 mmol/L   Potassium 4.7 3.5 - 5.1 mmol/L   Chloride 109 101 - 111 mmol/L   CO2 19 (L) 22 - 32 mmol/L   Glucose, Bld 148 (H) 65 - 99 mg/dL   BUN 80 (H) 6 - 20 mg/dL   Creatinine, Ser 4.71 (H) 0.61 - 1.24 mg/dL   Calcium 8.1 (L) 8.9 - 10.3 mg/dL   Phosphorus 4.9 (H) 2.5 - 4.6 mg/dL   Albumin 1.5 (L) 3.5 - 5.0 g/dL   GFR calc non Af Amer 12 (L) >60 mL/min   GFR calc Af Amer 14 (L) >60 mL/min    Comment: (NOTE) The eGFR has been calculated using the CKD EPI equation. This calculation has not been validated in all clinical situations. eGFR's persistently <60 mL/min signify possible Chronic Kidney Disease.    Anion gap 8 5 - 15    Current Facility-Administered Medications  Medication Dose Route Frequency Provider Last Rate Last Dose  . 0.9 %  sodium chloride infusion  100 mL Intravenous PRN Munsoor Lateef, MD      . 0.9 %  sodium chloride  infusion  100 mL Intravenous PRN Munsoor Lateef, MD      . acetaminophen (TYLENOL) tablet 650 mg  650 mg Oral Q6H PRN Fritzi Mandes, MD   650 mg at 10/18/16 0931  . alteplase (CATHFLO ACTIVASE) injection 2 mg  2 mg Intracatheter Once PRN Munsoor Lateef, MD      . alum & mag hydroxide-simeth (MAALOX/MYLANTA) 200-200-20 MG/5ML suspension 30 mL  30 mL Oral Q6H PRN Epifanio Lesches, MD   30 mL at 10/17/16 2148  . asenapine (  SAPHRIS) sublingual tablet 10 mg  10 mg Sublingual BID Gonzella Lex, MD   10 mg at 10/18/16 0932  . brimonidine (ALPHAGAN) 0.2 % ophthalmic solution 1 drop  1 drop Both Eyes Q12H Aruna Gouru, MD   1 drop at 10/18/16 0946   And  . timolol (TIMOPTIC) 0.5 % ophthalmic solution 1 drop  1 drop Both Eyes Q12H Nicholes Mango, MD   1 drop at 10/18/16 0945  . ceFAZolin (ANCEF) IVPB 1 g/50 mL premix  1 g Intravenous q1800 Napoleon Form, RPH   1 g at 10/17/16 1800  . docusate sodium (COLACE) capsule 100 mg  100 mg Oral BID Nicholes Mango, MD   100 mg at 10/18/16 0931  . feeding supplement (NEPRO CARB STEADY) liquid 237 mL  237 mL Oral BID BM Epifanio Lesches, MD   237 mL at 10/16/16 0906  . FLUoxetine (PROZAC) capsule 40 mg  40 mg Oral Daily Nicholes Mango, MD   40 mg at 10/18/16 0931  . haloperidol lactate (HALDOL) injection 2 mg  2 mg Intravenous Q6H PRN Gonzella Lex, MD   2 mg at 10/14/16 0253  . heparin injection 1,000 Units  1,000 Units Dialysis PRN Munsoor Lateef, MD      . latanoprost (XALATAN) 0.005 % ophthalmic solution 1 drop  1 drop Both Eyes QHS Nicholes Mango, MD   1 drop at 10/17/16 2254  . levothyroxine (SYNTHROID, LEVOTHROID) tablet 50 mcg  50 mcg Oral QAC breakfast Nicholes Mango, MD   50 mcg at 10/18/16 0931  . lidocaine (PF) (XYLOCAINE) 1 % injection 5 mL  5 mL Intradermal PRN Munsoor Lateef, MD      . lidocaine-prilocaine (EMLA) cream 1 application  1 application Topical PRN Munsoor Lateef, MD      . multivitamin with minerals tablet 1 tablet  1 tablet Oral Daily Nicholes Mango, MD    1 tablet at 10/18/16 0931  . ondansetron (ZOFRAN) tablet 4 mg  4 mg Oral Q6H PRN Nicholes Mango, MD       Or  . ondansetron (ZOFRAN) injection 4 mg  4 mg Intravenous Q6H PRN Nicholes Mango, MD   4 mg at 10/14/16 1615  . oxyCODONE (Oxy IR/ROXICODONE) immediate release tablet 5 mg  5 mg Oral Q6H PRN Fritzi Mandes, MD   5 mg at 10/16/16 2014  . pentafluoroprop-tetrafluoroeth (GEBAUERS) aerosol 1 application  1 application Topical PRN Munsoor Lateef, MD      . polyethylene glycol (MIRALAX / GLYCOLAX) packet 17 g  17 g Oral Daily Aruna Gouru, MD      . pyridOXINE (VITAMIN B-6) tablet 100 mg  100 mg Oral BID Nicholes Mango, MD   100 mg at 10/18/16 0945  . senna (SENOKOT) tablet 8.6 mg  1 tablet Oral Daily Nicholes Mango, MD      . silver sulfADIAZINE (SILVADENE) 1 % cream   Topical BID Fritzi Mandes, MD      . simvastatin (ZOCOR) tablet 20 mg  20 mg Oral QHS Nicholes Mango, MD   20 mg at 10/17/16 2251  . traZODone (DESYREL) tablet 100 mg  100 mg Oral QHS Nicholes Mango, MD   100 mg at 10/17/16 2251   Facility-Administered Medications Ordered in Other Encounters  Medication Dose Route Frequency Provider Last Rate Last Dose  . cyanocobalamin ((VITAMIN B-12)) injection 1,000 mcg  1,000 mcg Intramuscular Once Lequita Asal, MD        Musculoskeletal: Strength & Muscle Tone: decreased Gait & Station: unable  to stand Patient leans: Backward  Psychiatric Specialty Exam: Physical Exam  Nursing note and vitals reviewed. Constitutional: He appears well-developed and well-nourished.  HENT:  Head: Normocephalic and atraumatic.  Eyes: Conjunctivae are normal. Pupils are equal, round, and reactive to light.  Neck: Normal range of motion.  Cardiovascular: Normal heart sounds.   Respiratory: No respiratory distress.  GI: Soft.  Musculoskeletal: Normal range of motion.  Neurological: He is alert.  Skin: Skin is warm and dry.     Psychiatric: His affect is labile. His speech is tangential. His speech is not delayed and  not slurred. He is slowed. He is not agitated. He does not express impulsivity. He expresses no suicidal ideation. He expresses no suicidal plans. He exhibits abnormal recent memory.    Review of Systems  Constitutional: Negative.   HENT: Negative.   Eyes: Negative.   Respiratory: Negative.   Cardiovascular: Negative.   Gastrointestinal: Negative.   Musculoskeletal: Positive for joint pain and myalgias.  Skin: Negative.   Neurological: Negative.   Psychiatric/Behavioral: Negative for depression, hallucinations, memory loss, substance abuse and suicidal ideas. The patient is not nervous/anxious and does not have insomnia.     Blood pressure 113/70, pulse 77, temperature 98.2 F (36.8 C), temperature source Oral, resp. rate 16, height 6' 3"  (1.905 m), weight 91.6 kg (202 lb), SpO2 100 %.Body mass index is 25.25 kg/m.  General Appearance: Disheveled  Eye Contact:  None  Speech:  Garbled  Volume:  Decreased  Mood:  Irritable  Affect:  Full Range  Thought Process:  Goal Directed  Orientation:  Full (Time, Place, and Person)  Thought Content:  Patient has some disorganization of his thought but he is able to be redirected and he actually was able to discuss his current condition without reference to anything that appeared to be obviously delusional.  Suicidal Thoughts:  No  Homicidal Thoughts:  No  Memory:  Immediate;   Fair Recent;   Fair Remote;   Fair  Judgement:  Impaired  Insight:  Shallow  Psychomotor Activity:  Decreased  Concentration:  Concentration: Poor  Recall:  [employer] of Knowledge:  Fair  Language:  Fair  Akathisia:  No  Handed:  Right  AIMS (if indicated):     Assets:  Communication Skills Desire for Improvement Social Support  ADL's:  Impaired  Cognition:  Impaired,  Mild  Sleep:        Treatment Plan Summary: Daily contact with patient to assess and evaluate symptoms and progress in treatment, Medication management and Plan Continue off of Depakote.  Continue on current psychiatric medicine. Allowed patient to ventilate slightly and encouraged him to continue being patient. No further change to psychiatric plan at this point. I will follow-up as needed.   Seems to be staying stable psychiatrically. No change to [place]. Continue off of Depakote. Disposition: Supportive therapy provided about ongoing stressors.  Alethia Berthold, MD 10/18/2016 4:47 PM

## 2016-10-18 NOTE — Progress Notes (Signed)
[facility]  ROUNDING NOTE   Subjective:   S Cr still high at 4.71 No shortness of breath No edema good urine output   Objective:  Vital signs in last 24 hours:  Temp:  [98.2 F (36.8 C)-98.5 F (36.9 C)] 98.2 F (36.8 C) (11/20 1356) Pulse Rate:  [77-82] 77 (11/20 1356) Resp:  [16-20] 16 (11/20 1356) BP: (108-121)/(62-70) 113/70 (11/20 1356) SpO2:  [99 %-100 %] 100 % (11/20 1356)  Weight change:  Filed Weights   10/13/16 1350 10/13/16 1705 10/14/16 1504  Weight: 91.7 kg (202 lb 2.6 oz) 91.7 kg (202 lb 2.6 oz) 91.6 kg (202 lb)    Intake/Output: I/O last 3 completed shifts: In: 73 [P.O.:820] Out: 4250 [Urine:4250]   Intake/Output this shift:  Total I/O In: 240 [P.O.:240] Out: 575 [Urine:575]  Physical Exam: General: Laying in bed  Head: Normocephalic, atraumatic. Moist oral mucosal membranes  Eyes: Anicteric  Neck: Supple, trachea midline  Lungs:  Clear to auscultation, normal effort  Heart: S1S2 no rubs  Abdomen:  Soft, nontender, BS present  Extremities: trace edema left lower extremity, +erythema and clean dressings.   Neurologic: Awake, follows commands  Skin: Left foot induration  Access:  Right IJ permcath 11/16 Dr. Lucky Cowboy, left femoral temp HD catheter 16/38    Basic Metabolic Panel:  Recent Labs Lab 10/13/16 0554 10/14/16 0604 10/15/16 0534 10/16/16 0704 10/17/16 0549 10/18/16 0955  NA 147* 141 140 137 137 136  K 5.3* 5.0 4.8 4.9 4.8 4.7  CL 118* 108 110 110 111 109  CO2 21* 25 24 19* 20* 19*  GLUCOSE 98 111* 94 97 121* 148*  BUN 76* 55* 65* 67* 75* 80*  CREATININE 4.63* 3.72* 4.13* 4.39* 4.61* 4.71*  CALCIUM 7.9* 7.4* 7.8* 7.7* 7.8* 8.1*  PHOS 6.8*  --  5.6* 5.3* 4.2 4.9*    Liver Function Tests:  Recent Labs Lab 10/13/16 0554 10/15/16 0534 10/16/16 0704 10/17/16 0549 10/18/16 0955  ALBUMIN 1.4* 1.4* 1.3* 1.5* 1.5*   No results for input(s): LIPASE, AMYLASE in the last 168 hours. No results for input(s): AMMONIA  in the last 168 hours.  CBC:  Recent Labs Lab 10/13/16 1400 10/14/16 0604 10/14/16 1912 10/15/16 0534  WBC 13.1* 10.9*  --  12.1*  HGB 7.6* 6.3* 8.3* 7.6*  HCT 23.7* 18.7* 24.9* 23.0*  MCV 92.9 91.6  --  92.5  PLT 136* 110*  --  123*    Cardiac Enzymes: No results for input(s): CKTOTAL, CKMB, CKMBINDEX, TROPONINI in the last 168 hours.  BNP: Invalid input(s): POCBNP  CBG: No results for input(s): GLUCAP in the last 168 hours.  Microbiology: Results for orders placed or performed during the hospital encounter of 10/04/16  Blood Culture (routine x 2)     Status: Abnormal   Collection Time: 10/04/16  3:51 PM  Result Value Ref Range Status   Specimen Description BLOOD RT [facility]  Final   Special Requests BOTTLES DRAWN AEROBIC AND ANAEROBIC 10CC  Final   Culture  Setup Time   Final    Organism ID to follow GRAM NEGATIVE RODS IN BOTH AEROBIC AND ANAEROBIC BOTTLES CRITICAL RESULT CALLED TO, READ BACK BY AND VERIFIED WITH: NATE COOKSON ON 10/05/16 AT 0448 BY TLB CONFIRMED BY TLB    Culture ESCHERICHIA COLI (A)  Final   Report Status 10/07/2016 FINAL  Final   Organism ID, Bacteria ESCHERICHIA COLI  Final      Susceptibility   Escherichia coli - MIC*  AMPICILLIN >=32 RESISTANT Resistant     CEFAZOLIN <=4 SENSITIVE Sensitive     CEFEPIME <=1 SENSITIVE Sensitive     CEFTAZIDIME <=1 SENSITIVE Sensitive     CEFTRIAXONE <=1 SENSITIVE Sensitive     CIPROFLOXACIN <=0.25 SENSITIVE Sensitive     GENTAMICIN <=1 SENSITIVE Sensitive     IMIPENEM <=0.25 SENSITIVE Sensitive     TRIMETH/SULFA >=320 RESISTANT Resistant     AMPICILLIN/SULBACTAM 16 INTERMEDIATE Intermediate     PIP/TAZO <=4 SENSITIVE Sensitive     Extended ESBL NEGATIVE Sensitive     * ESCHERICHIA COLI  Blood Culture ID Panel (Reflexed)     Status: Abnormal   Collection Time: 10/04/16  3:51 PM  Result Value Ref Range Status   Enterococcus species NOT DETECTED NOT DETECTED Final   Listeria monocytogenes NOT DETECTED NOT  DETECTED Final   Staphylococcus species NOT DETECTED NOT DETECTED Final   Staphylococcus aureus NOT DETECTED NOT DETECTED Final   Streptococcus species NOT DETECTED NOT DETECTED Final   Streptococcus agalactiae NOT DETECTED NOT DETECTED Final   Streptococcus pneumoniae NOT DETECTED NOT DETECTED Final   Streptococcus pyogenes NOT DETECTED NOT DETECTED Final   Acinetobacter baumannii NOT DETECTED NOT DETECTED Final   Enterobacteriaceae species DETECTED (A) NOT DETECTED Final    Comment: CRITICAL RESULT CALLED TO, READ BACK BY AND VERIFIED WITH: NATE COOKSON ON 10/05/16 AT 0448 BY TLB    Enterobacter cloacae complex NOT DETECTED NOT DETECTED Final   Escherichia coli DETECTED (A) NOT DETECTED Final    Comment: CRITICAL RESULT CALLED TO, READ BACK BY AND VERIFIED WITH: NATE COOKSON ON 10/05/16 AT 0448 BY TLB                                                                                                Klebsiella oxytoca NOT DETECTED NOT DETECTED Final   Klebsiella pneumoniae NOT DETECTED NOT DETECTED Final   Proteus species NOT DETECTED NOT DETECTED Final   Serratia marcescens NOT DETECTED NOT DETECTED Final   Carbapenem resistance NOT DETECTED NOT DETECTED Final   Haemophilus influenzae NOT DETECTED NOT DETECTED Final   Neisseria meningitidis NOT DETECTED NOT DETECTED Final   Pseudomonas aeruginosa NOT DETECTED NOT DETECTED Final   Candida albicans NOT DETECTED NOT DETECTED Final   Candida glabrata NOT DETECTED NOT DETECTED Final   Candida krusei NOT DETECTED NOT DETECTED Final   Candida parapsilosis NOT DETECTED NOT DETECTED Final   Candida tropicalis NOT DETECTED NOT DETECTED Final  Blood Culture (routine x 2)     Status: Abnormal   Collection Time: 10/04/16  3:56 PM  Result Value Ref Range Status   Specimen Description BLOOD LT [facility]  Final   Special Requests BOTTLES DRAWN AEROBIC AND ANAEROBIC 10CC  Final   Culture  Setup Time   Final    GRAM NEGATIVE RODS IN BOTH AEROBIC AND  ANAEROBIC BOTTLES CRITICAL RESULT CALLED TO, READ BACK BY AND VERIFIED WITH: NATE COOKSON ON 10/05/16 AT 0448 BY TLB CONFIRMED BY TLB    Culture ESCHERICHIA COLI (A)  Final   Report Status 10/07/2016 FINAL  Final  Urine culture  Status: Abnormal   Collection Time: 10/04/16  9:01 PM  Result Value Ref Range Status   Specimen Description URINE, RANDOM  Final   Special Requests NONE  Final   Culture 30,000 COLONIES/mL ESCHERICHIA COLI (A)  Final   Report Status 10/07/2016 FINAL  Final   Organism ID, Bacteria ESCHERICHIA COLI (A)  Final      Susceptibility   Escherichia coli - MIC*    AMPICILLIN >=32 RESISTANT Resistant     CEFAZOLIN <=4 SENSITIVE Sensitive     CEFTRIAXONE <=1 SENSITIVE Sensitive     CIPROFLOXACIN <=0.25 SENSITIVE Sensitive     GENTAMICIN <=1 SENSITIVE Sensitive     IMIPENEM <=0.25 SENSITIVE Sensitive     NITROFURANTOIN <=16 SENSITIVE Sensitive     TRIMETH/SULFA >=320 RESISTANT Resistant     AMPICILLIN/SULBACTAM 16 INTERMEDIATE Intermediate     PIP/TAZO <=4 SENSITIVE Sensitive     Extended ESBL NEGATIVE Sensitive     * 30,000 COLONIES/mL ESCHERICHIA COLI  MRSA PCR Screening     Status: None   Collection Time: 10/04/16  9:01 PM  Result Value Ref Range Status   MRSA by PCR NEGATIVE NEGATIVE Final    Comment:        The GeneXpert MRSA Assay (FDA approved for NASAL specimens only), is one component of a comprehensive MRSA colonization surveillance program. It is not intended to diagnose MRSA infection nor to guide or monitor treatment for MRSA infections.   MRSA PCR Screening     Status: None   Collection Time: 10/06/16 11:59 AM  Result Value Ref Range Status   MRSA by PCR NEGATIVE NEGATIVE Final    Comment:        The GeneXpert MRSA Assay (FDA approved for NASAL specimens only), is one component of a comprehensive MRSA colonization surveillance program. It is not intended to diagnose MRSA infection nor to guide or monitor treatment for MRSA  infections.   Aerobic Culture (superficial specimen)     Status: None   Collection Time: 10/11/16  4:03 PM  Result Value Ref Range Status   Specimen Description LEG  Final   Special Requests Normal  Final   Gram Stain NO WBC SEEN NO ORGANISMS SEEN   Final   Culture   Final    NO GROWTH 3 DAYS Performed at [facility]    Report Status 10/14/2016 FINAL  Final  CULTURE, BLOOD (ROUTINE X 2) w Reflex to ID Panel     Status: None   Collection Time: 10/13/16  9:33 AM  Result Value Ref Range Status   Specimen Description BLOOD RIGHT HAND  Final   Special Requests   Final    BOTTLES DRAWN AEROBIC AND ANAEROBIC [place]   Culture NO GROWTH 5 DAYS  Final   Report Status 10/18/2016 FINAL  Final  CULTURE, BLOOD (ROUTINE X 2) w Reflex to ID Panel     Status: None   Collection Time: 10/13/16  9:40 AM  Result Value Ref Range Status   Specimen Description BLOOD LEFT ASSIST CONTROL  Final   Special Requests   Final    BOTTLES DRAWN AEROBIC AND ANAEROBIC [place]   Culture NO GROWTH 5 DAYS  Final   Report Status 10/18/2016 FINAL  Final    Coagulation Studies: No results for input(s): LABPROT, INR in the last 72 hours.  Urinalysis: No results for input(s): COLORURINE, LABSPEC, PHURINE, GLUCOSEU, HGBUR, BILIRUBINUR, KETONESUR, PROTEINUR, UROBILINOGEN, NITRITE, LEUKOCYTESUR in the last 72 hours.  Invalid input(s): APPERANCEUR  Imaging: Dg Chest 1 View  Result Date: 10/17/2016 CLINICAL DATA:  New line placement. EXAM: CHEST 1 VIEW COMPARISON:  Apr 23, 2010 FINDINGS: A mechanical device overlies the chest limiting evaluation. A new double lumen central line terminates near the caval atrial junction. A single-lumen left central line terminates in the SVC. No pneumothorax. Atelectasis seen in the bases. The heart, hila, and mediastinum are unremarkable. IMPRESSION: Central lines are in good position with no pneumothorax. Bibasilar atelectasis. Electronically Signed   By:  Dorise Bullion III M.D   On: 10/17/2016 14:40     Medications:    . asenapine  10 mg Sublingual BID  . brimonidine  1 drop Both Eyes Q12H   And  . timolol  1 drop Both Eyes Q12H  .  ceFAZolin (ANCEF) IV  1 g Intravenous q1800  . docusate sodium  100 mg Oral BID  . feeding supplement (NEPRO CARB STEADY)  237 mL Oral BID BM  . FLUoxetine  40 mg Oral Daily  . latanoprost  1 drop Both Eyes QHS  . levothyroxine  50 mcg Oral QAC breakfast  . multivitamin with minerals  1 tablet Oral Daily  . pyridOXINE  100 mg Oral BID  . senna  1 tablet Oral Daily  . silver sulfADIAZINE   Topical BID  . simvastatin  20 mg Oral QHS  . traZODone  100 mg Oral QHS   sodium chloride, sodium chloride, acetaminophen, alteplase, alum & mag hydroxide-simeth, haloperidol lactate, heparin, lidocaine (PF), lidocaine-prilocaine, ondansetron **OR** ondansetron (ZOFRAN) IV, oxyCODONE, pentafluoroprop-tetrafluoroeth  Assessment/ Plan:  60 y.o. white male with past medication history of anemia, bipolar disorder, degenerative disc disease, GERD, hyperlipidemia, hypertension, paranoid schizophrenia, who was admitted to [facility] on 10/04/2016 for evaluation of left leg swelling and redness.   1.  Acute renal failure on chronic kidney disease stage III: baseline creatinine of 2.1 EGFR 32 with proteinuria Acute renal failure secondary to sepsis, hypotension and ATN Requiring renal replacement. Two dialysis treatments 11/11 and 11/12.  Nonoliguric urine output. However creatinine continues to rise Chronic kidney disease secondary to hypertension  - Last Hemodialysis 11/15 due to hyperkalemia, and metabolic acidosis.  - Monitor daily for dialysis need.   2.  Hypotension: blood pressure has improved. Secondary to sepsis.  Takes labetalol at home. Hold all blood pressure agents for now.   3.  Left lower extremity cellulitis: with sepsis. E coli from blood cultures 11/6  Leukocytosis improving. Afebrile - cefazolin   -  repeat blood cultures negative.  - Appreciate vascular input.   4. Anemia and thrombocytopenia: hemoglobin low. Platelets improved off valproic acid Hematology consulted on 11/9 PRBC transfusion on 11/16  5. Catheter: now with RIJ permcath.  Femoral trialysis has been removed  6/ Disposition To be determined based on continued need for Hemodialysis - Renal function is expected to improve - UOP is good - No acute indication for HD at present, will continue to monitor   LOS: 14 Breana Litts 11/20/20174:09 PM

## 2016-10-18 NOTE — Progress Notes (Signed)
Patient currently tolerating sitting in chair for 2hrs and 24min., encouraged patient to continue sitting in recliner for total 4 hours. Patient receptive.

## 2016-10-18 NOTE — Progress Notes (Signed)
[place] [facility] [place] NAME: Brandon Maynard    MR#:  [id number]  DATE OF BIRTH:  November 01, 1956  SUBJECTIVE: Patient denies any complaints. Patient had a triple lumen catheter on the left side of the neck   Came in with left leg swelling and erythema   REVIEW OF SYSTEMS:   Review of Systems  Constitutional: Negative for chills, fever and weight loss.  HENT: Negative for ear discharge, ear pain and nosebleeds.   Eyes: Negative for blurred vision, pain and discharge.  Respiratory: Negative for sputum production, shortness of breath, wheezing and stridor.   Cardiovascular: Negative for chest pain, palpitations, orthopnea, leg swelling and PND.  Gastrointestinal: Negative for abdominal pain, diarrhea, nausea and vomiting.  Genitourinary: Negative for frequency and urgency.  Musculoskeletal: Negative for back pain and joint pain.  Neurological: Negative for sensory change, speech change, focal weakness and weakness.  Psychiatric/Behavioral: Negative for depression and hallucinations. The patient is not nervous/anxious.    Tolerating Diet:yes  DRUG ALLERGIES:   Allergies  Allergen Reactions  . Tetracyclines & Related Hives    VITALS:  Blood pressure 113/70, pulse 77, temperature 98.2 F (36.8 C), temperature source Oral, resp. rate 16, height 6\' 3"  (1.905 m), weight 91.6 kg (202 lb), SpO2 100 %.  PHYSICAL EXAMINATION:   Physical Exam  GENERAL:  60 y.o.-year-old patient lying in the bed with no acute distress.  EYES: Pupils equal, round, reactive to light and accommodation. No scleral icterus. Extraocular muscles intact.  HEENT: Head atraumatic, normocephalic. Oropharynx and nasopharynx clear.  NECK:  Supple, no jugular venous distention. No thyroid enlargement, no tenderness.  LUNGS: Normal breath sounds bilaterally, no wheezing, rales, rhonchi. No use of accessory muscles of respiration.  CARDIOVASCULAR: S1, S2 normal. No murmurs, rubs,  or gallops.  ABDOMEN: Soft, nontender, nondistended. Bowel sounds present. No organomegaly or mass.  EXTREMITIES: dressing  Present for left foot. NEUROLOGIC;Alert,awake,oriented during my visit. PSYCHIATRIC: Alert,awake,oriented,  LABORATORY PANEL:  CBC  Recent Labs Lab 10/15/16 0534  WBC 12.1*  HGB 7.6*  HCT 23.0*  PLT 123*    Chemistries   Recent Labs Lab 10/18/16 0955  NA 136  K 4.7  CL 109  CO2 19*  GLUCOSE 148*  BUN 80*  CREATININE 4.71*  CALCIUM 8.1*   Cardiac Enzymes No results for input(s): TROPONINI in the last 168 hours. RADIOLOGY:  Dg Chest 1 View  Result Date: 10/17/2016 CLINICAL DATA:  New line placement. EXAM: CHEST 1 VIEW COMPARISON:  Apr 23, 2010 FINDINGS: A mechanical device overlies the chest limiting evaluation. A new double lumen central line terminates near the caval atrial junction. A single-lumen left central line terminates in the SVC. No pneumothorax. Atelectasis seen in the bases. The heart, hila, and mediastinum are unremarkable. IMPRESSION: Central lines are in good position with no pneumothorax. Bibasilar atelectasis. Electronically Signed   By: Dorise Bullion III M.D   On: 10/17/2016 14:40   ASSESSMENT AND PLAN:   Brandon Maynard  is a 60 y.o. male with a known history of Bipolar disorder, cataract, chronic thrombocytopenia is presenting to the ED with a chief complaint of significant worsening of left lower extremity pain, redness and swelling which started with a small blister on the left foot. Denies any insect bites  #Sepsis meets criteria with leukocytosis, elevated lactic acid and hypotension. Secondary to left leg cellulitis On   antibiotics  Ancef, patient has Escherichia coli bacteremia . WBC improved from admission, now down to 12.9.  Previous MRSA Elevated the leg. . Dr Ronalee Belts does not feel any need for intervention for PAD. CT tibia-fibula shows inflammation and swelling. No loculated collection of fluid. rpt blood cultures from  11/15 negative,wound cultures are negative. As per ID patient needs total of 14 days Ancef, changed to Keflex for another week.   #Delirium with past medical history of paranoid schizophrenia and bipolar disorder . 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  #Hyponatremia Resolved  #Thrombocytopenia- improving.  -plt 42K---18K--22k -multifactorial. Pt has been evaluated by Oncology in the past and appears due to meds/sepsis and CKD -Depakote has been discontinued by Dr clapacs Repeat CBC in a.m.  # DVT prophylaxis with SCDs  # Acute on chronic anemia;;s/p  One unit blood transfusion.  Case discussed with Care Management/Social Worker. Management plans discussed with the patient, family and they are in agreement.  CODE STATUS:FULL  DVT Prophylaxis: SCD  TOTAL TIME TAKING CARE OF THIS PATIENT 35 minutes.   Note: This dictation was prepared with Dragon dictation along with smaller phrase technology. Any transcriptional errors that result from this process are unintentional.  Nicholes Mango M.D on 10/18/2016 at 3:11 PM  Between 7am to 6pm - Pager - [phone]  After 6pm go to [email or web address] - password [email or web address] [facility]  [facility]  Office  [phone]  CC: Primary care physician; [name], MD

## 2016-10-18 NOTE — Progress Notes (Signed)
[facility] PROGRESS NOTE Date of Admission:  10/04/2016     ID: Brandon Maynard. is a 60 y.o. male with  E coli sepsis and LE cellulitis Principal Problem:   Acute delirium Active Problems:   Schizoaffective disorder (HCC)   Thrombocytopenia (HCC)   Left leg cellulitis   Subjective: No fevers, trying to elevate leg.   ROS  Eleven systems are reviewed and negative except per hpi  Medications:  Antibiotics Given (last 72 hours)    Date/Time Action Medication Dose Rate   10/15/16 1724 Given   ceFAZolin (ANCEF) IVPB 1 g/50 mL premix 1 g 100 mL/hr   10/15/16 2200 Given   cefUROXime (ZINACEF) 1.5 g in dextrose 5 % 50 mL IVPB 1.5 g 100 mL/hr   10/16/16 0539 Given   cefUROXime (ZINACEF) 1.5 g in dextrose 5 % 50 mL IVPB 1.5 g 100 mL/hr   10/16/16 1743 Given   ceFAZolin (ANCEF) IVPB 1 g/50 mL premix 1 g 100 mL/hr   10/17/16 1800 Given   ceFAZolin (ANCEF) IVPB 1 g/50 mL premix 1 g 100 mL/hr     . asenapine  10 mg Sublingual BID  . brimonidine  1 drop Both Eyes Q12H   And  . timolol  1 drop Both Eyes Q12H  .  ceFAZolin (ANCEF) IV  1 g Intravenous q1800  . docusate sodium  100 mg Oral BID  . feeding supplement (NEPRO CARB STEADY)  237 mL Oral BID BM  . FLUoxetine  40 mg Oral Daily  . latanoprost  1 drop Both Eyes QHS  . levothyroxine  50 mcg Oral QAC breakfast  . multivitamin with minerals  1 tablet Oral Daily  . pyridOXINE  100 mg Oral BID  . silver sulfADIAZINE   Topical BID  . simvastatin  20 mg Oral QHS  . traZODone  100 mg Oral QHS    Objective: Vital signs in last 24 hours: Temp:  [98.2 F (36.8 C)-98.5 F (36.9 C)] 98.2 F (36.8 C) (11/20 1356) Pulse Rate:  [77-82] 77 (11/20 1356) Resp:  [16-20] 16 (11/20 1356) BP: (108-121)/(62-70) 113/70 (11/20 1356) SpO2:  [99 %-100 %] 100 % (11/20 1356) Constitutional: He is oriented to person, place, and time. Dishelved, chronically ill appearing HENT:  Mouth/Throat: Oropharynx is clear and moist. No  oropharyngeal exudate.  Cardiovascular: Normal rate, regular rhythm and normal heart sounds. Exam reveals no gallop and no friction rub.  No murmur heard.  Pulmonary/Chest: Effort normal and breath sounds normal. No respiratory distress. He has no wheezes.  Abdominal: Soft. Bowel sounds are normal. He exhibits no distension. There is no tenderness.  Lymphadenopathy:  He has no cervical adenopathy.  Neurological: He is alert and oriented to person, place, and time.  LLE 2 + edema Skin: L leg with brawny hyperpigmentation and dorsal of foot with denuded bullae with minimal drainage  Lab Results  Recent Labs  10/17/16 0549 10/18/16 0955  NA 137 136  K 4.8 4.7  CL 111 109  CO2 20* 19*  BUN 75* 80*  CREATININE 4.61* 4.71*    Microbiology: Results for orders placed or performed during the hospital encounter of 10/04/16  Blood Culture (routine x 2)     Status: Abnormal   Collection Time: 10/04/16  3:51 PM  Result Value Ref Range Status   Specimen Description BLOOD RT [facility]  Final   Special Requests BOTTLES DRAWN AEROBIC AND ANAEROBIC 10CC  Final   Culture  Setup Time  Final    Organism ID to follow GRAM NEGATIVE RODS IN BOTH AEROBIC AND ANAEROBIC BOTTLES CRITICAL RESULT CALLED TO, READ BACK BY AND VERIFIED WITH: NATE COOKSON ON 10/05/16 AT 0448 BY TLB CONFIRMED BY TLB    Culture ESCHERICHIA COLI (A)  Final   Report Status 10/07/2016 FINAL  Final   Organism ID, Bacteria ESCHERICHIA COLI  Final      Susceptibility   Escherichia coli - MIC*    AMPICILLIN >=32 RESISTANT Resistant     CEFAZOLIN <=4 SENSITIVE Sensitive     CEFEPIME <=1 SENSITIVE Sensitive     CEFTAZIDIME <=1 SENSITIVE Sensitive     CEFTRIAXONE <=1 SENSITIVE Sensitive     CIPROFLOXACIN <=0.25 SENSITIVE Sensitive     GENTAMICIN <=1 SENSITIVE Sensitive     IMIPENEM <=0.25 SENSITIVE Sensitive     TRIMETH/SULFA >=320 RESISTANT Resistant     AMPICILLIN/SULBACTAM 16 INTERMEDIATE Intermediate     PIP/TAZO <=4 SENSITIVE  Sensitive     Extended ESBL NEGATIVE Sensitive     * ESCHERICHIA COLI  Blood Culture ID Panel (Reflexed)     Status: Abnormal   Collection Time: 10/04/16  3:51 PM  Result Value Ref Range Status   Enterococcus species NOT DETECTED NOT DETECTED Final   Listeria monocytogenes NOT DETECTED NOT DETECTED Final   Staphylococcus species NOT DETECTED NOT DETECTED Final   Staphylococcus aureus NOT DETECTED NOT DETECTED Final   Streptococcus species NOT DETECTED NOT DETECTED Final   Streptococcus agalactiae NOT DETECTED NOT DETECTED Final   Streptococcus pneumoniae NOT DETECTED NOT DETECTED Final   Streptococcus pyogenes NOT DETECTED NOT DETECTED Final   Acinetobacter baumannii NOT DETECTED NOT DETECTED Final   Enterobacteriaceae species DETECTED (A) NOT DETECTED Final    Comment: CRITICAL RESULT CALLED TO, READ BACK BY AND VERIFIED WITH: NATE COOKSON ON 10/05/16 AT 0448 BY TLB    Enterobacter cloacae complex NOT DETECTED NOT DETECTED Final   Escherichia coli DETECTED (A) NOT DETECTED Final    Comment: CRITICAL RESULT CALLED TO, READ BACK BY AND VERIFIED WITH: NATE COOKSON ON 10/05/16 AT 0448 BY TLB                                                                                                Klebsiella oxytoca NOT DETECTED NOT DETECTED Final   Klebsiella pneumoniae NOT DETECTED NOT DETECTED Final   Proteus species NOT DETECTED NOT DETECTED Final   Serratia marcescens NOT DETECTED NOT DETECTED Final   Carbapenem resistance NOT DETECTED NOT DETECTED Final   Haemophilus influenzae NOT DETECTED NOT DETECTED Final   Neisseria meningitidis NOT DETECTED NOT DETECTED Final   Pseudomonas aeruginosa NOT DETECTED NOT DETECTED Final   Candida albicans NOT DETECTED NOT DETECTED Final   Candida glabrata NOT DETECTED NOT DETECTED Final   Candida krusei NOT DETECTED NOT DETECTED Final   Candida parapsilosis NOT DETECTED NOT DETECTED Final   Candida tropicalis NOT DETECTED NOT DETECTED Final  Blood Culture  (routine x 2)     Status: Abnormal   Collection Time: 10/04/16  3:56 PM  Result Value Ref Range Status   Specimen Description  BLOOD LT AC  Final   Special Requests BOTTLES DRAWN AEROBIC AND ANAEROBIC 10CC  Final   Culture  Setup Time   Final    GRAM NEGATIVE RODS IN BOTH AEROBIC AND ANAEROBIC BOTTLES CRITICAL RESULT CALLED TO, READ BACK BY AND VERIFIED WITH: NATE COOKSON ON 10/05/16 AT 0448 BY TLB CONFIRMED BY TLB    Culture ESCHERICHIA COLI (A)  Final   Report Status 10/07/2016 FINAL  Final  Urine culture     Status: Abnormal   Collection Time: 10/04/16  9:01 PM  Result Value Ref Range Status   Specimen Description URINE, RANDOM  Final   Special Requests NONE  Final   Culture 30,000 COLONIES/mL ESCHERICHIA COLI (A)  Final   Report Status 10/07/2016 FINAL  Final   Organism ID, Bacteria ESCHERICHIA COLI (A)  Final      Susceptibility   Escherichia coli - MIC*    AMPICILLIN >=32 RESISTANT Resistant     CEFAZOLIN <=4 SENSITIVE Sensitive     CEFTRIAXONE <=1 SENSITIVE Sensitive     CIPROFLOXACIN <=0.25 SENSITIVE Sensitive     GENTAMICIN <=1 SENSITIVE Sensitive     IMIPENEM <=0.25 SENSITIVE Sensitive     NITROFURANTOIN <=16 SENSITIVE Sensitive     TRIMETH/SULFA >=320 RESISTANT Resistant     AMPICILLIN/SULBACTAM 16 INTERMEDIATE Intermediate     PIP/TAZO <=4 SENSITIVE Sensitive     Extended ESBL NEGATIVE Sensitive     * 30,000 COLONIES/mL ESCHERICHIA COLI  MRSA PCR Screening     Status: None   Collection Time: 10/04/16  9:01 PM  Result Value Ref Range Status   MRSA by PCR NEGATIVE NEGATIVE Final    Comment:        The GeneXpert MRSA Assay (FDA approved for NASAL specimens only), is one component of a comprehensive MRSA colonization surveillance program. It is not intended to diagnose MRSA infection nor to guide or monitor treatment for MRSA infections.   MRSA PCR Screening     Status: None   Collection Time: 10/06/16 11:59 AM  Result Value Ref Range Status   MRSA by PCR  NEGATIVE NEGATIVE Final    Comment:        The GeneXpert MRSA Assay (FDA approved for NASAL specimens only), is one component of a comprehensive MRSA colonization surveillance program. It is not intended to diagnose MRSA infection nor to guide or monitor treatment for MRSA infections.   Aerobic Culture (superficial specimen)     Status: None   Collection Time: 10/11/16  4:03 PM  Result Value Ref Range Status   Specimen Description LEG  Final   Special Requests Normal  Final   Gram Stain NO WBC SEEN NO ORGANISMS SEEN   Final   Culture   Final    NO GROWTH 3 DAYS Performed at [facility]    Report Status 10/14/2016 FINAL  Final  CULTURE, BLOOD (ROUTINE X 2) w Reflex to ID Panel     Status: None   Collection Time: 10/13/16  9:33 AM  Result Value Ref Range Status   Specimen Description BLOOD RIGHT HAND  Final   Special Requests   Final    BOTTLES DRAWN AEROBIC AND ANAEROBIC [place]   Culture NO GROWTH 5 DAYS  Final   Report Status 10/18/2016 FINAL  Final  CULTURE, BLOOD (ROUTINE X 2) w Reflex to ID Panel     Status: None   Collection Time: 10/13/16  9:40 AM  Result Value Ref Range Status   Specimen Description BLOOD  LEFT ASSIST CONTROL  Final   Special Requests   Final    BOTTLES DRAWN AEROBIC AND ANAEROBIC [place]   Culture NO GROWTH 5 DAYS  Final   Report Status 10/18/2016 FINAL  Final    Studies/Results: Dg Chest 1 View  Result Date: 10/17/2016 CLINICAL DATA:  New line placement. EXAM: CHEST 1 VIEW COMPARISON:  Apr 23, 2010 FINDINGS: A mechanical device overlies the chest limiting evaluation. A new double lumen central line terminates near the caval atrial junction. A single-lumen left central line terminates in the SVC. No pneumothorax. Atelectasis seen in the bases. The heart, hila, and mediastinum are unremarkable. IMPRESSION: Central lines are in good position with no pneumothorax. Bibasilar atelectasis. Electronically Signed   By: Dorise Bullion III M.D   On: 10/17/2016 14:40    Assessment/Plan: Brandon Maynard. is a 60 y.o. male with multiple medical problems admitted with severe bullous LLE cellulitis, ARF and E coli bacteremia with E coli UCX as well. His wbc markedly increased initially but now down to 12.1  He reports he has had MRSA in past but I cannot find any records indicating such.  He is now on HD. Initially on meropenem and vanco but then changed to keflex but it appeared to worsen although no fever or increased wbc.  Day 14 of abx, cx wound negative.   Recommendations Can change to oral keflex for one more week.  - this should could strep, staph and the E coli in his blood Instructed pt to elevate on 3-4 pillows   Sherrill Buikema P   10/18/2016, 3:33 PM

## 2016-10-19 LAB — BASIC METABOLIC PANEL
Anion gap: 6 (ref 5–15)
BUN: 75 mg/dL — ABNORMAL HIGH (ref 6–20)
CHLORIDE: 110 mmol/L (ref 101–111)
CO2: 18 mmol/L — ABNORMAL LOW (ref 22–32)
Calcium: 8.3 mg/dL — ABNORMAL LOW (ref 8.9–10.3)
Creatinine, Ser: 4.68 mg/dL — ABNORMAL HIGH (ref 0.61–1.24)
GFR, EST AFRICAN AMERICAN: 14 mL/min — AB (ref >60)
GFR, EST NON AFRICAN AMERICAN: 12 mL/min — AB (ref >60)
Glucose, Bld: 117 mg/dL — ABNORMAL HIGH (ref 65–99)
POTASSIUM: 4.7 mmol/L (ref 3.5–5.1)
SODIUM: 134 mmol/L — AB (ref 135–145)

## 2016-10-19 LAB — CBC
HEMATOCRIT: 18.6 % — AB (ref 40.0–52.0)
Hemoglobin: 6.3 g/dL — ABNORMAL LOW (ref 13.0–18.0)
MCH: 30.1 pg (ref 26.0–34.0)
MCHC: 33.9 g/dL (ref 32.0–36.0)
MCV: 88.7 fL (ref 80.0–100.0)
PLATELETS: 154 10*3/uL (ref 150–440)
RBC: 2.1 MIL/uL — AB (ref 4.40–5.90)
RDW: 13.8 % (ref 11.5–14.5)
WBC: 7.8 10*3/uL (ref 3.8–10.6)

## 2016-10-19 LAB — HEMOGLOBIN AND HEMATOCRIT, BLOOD
HCT: 19.8 % — ABNORMAL LOW (ref 40.0–52.0)
HEMATOCRIT: 19.9 % — AB (ref 40.0–52.0)
HEMOGLOBIN: 6.7 g/dL — AB (ref 13.0–18.0)
Hemoglobin: 6.8 g/dL — ABNORMAL LOW (ref 13.0–18.0)

## 2016-10-19 LAB — PREPARE RBC (CROSSMATCH)

## 2016-10-19 MED ORDER — SODIUM CHLORIDE 0.9 % IV SOLN
Freq: Once | INTRAVENOUS | Status: AC
  Administered 2016-10-19: 19:00:00 via INTRAVENOUS

## 2016-10-19 NOTE — Progress Notes (Signed)
Patient tolerated sitting in chair until 2115, in excess of 4 hours. 2 person assist to place into bed.

## 2016-10-19 NOTE — Evaluation (Signed)
Physical Therapy Evaluation Patient Details Name: Brandon Maynard. MRN: [id number] DOB: Jul 03, 1956 Today's Date: 10/19/2016   History of Present Illness  60 y.o. male with a known history of Bipolar disorder, cataract, chronic thrombocytopenia is presenting to the ED with a chief complaint of significant worsening of left lower extremity pain, redness and swelling which started with a small blister on the left foot (insect bites?) Pt now with conisederable amount of open wound on dorsum of L foot.  Clinical Impression  Pt with temporary femoral catheter removed, able to work with PT.  He has a significant wound on L foot and though he showed good effort in trying to stand/walk/mobilize with walker he ultimately was very limited and generally unsafe with all standing acts.  He displayed good bed mobility and relative strength, but functionally is very limited.  Pt agrees that he would have a very hard time at the group home and be unsafe given his current mobility status.     Follow Up Recommendations SNF (pt unsafe to return to group home at this time)    Equipment Recommendations  Rolling walker with 5" wheels    Recommendations for Other Services       Precautions / Restrictions Restrictions Weight Bearing Restrictions: Yes LLE Weight Bearing: Weight bearing as tolerated Other Position/Activity Restrictions: pt very uncomfortable trying to take weight through L foot      Mobility  Bed Mobility Overal bed mobility: Modified Independent             General bed mobility comments: Pt was able to get himself up to EOB w/o assist  Transfers Overall transfer level: Needs assistance Equipment used: Rolling walker (2 wheeled) Transfers: Sit to/from Stand Sit to Stand: Min assist         General transfer comment: Pt needing a lot of encouragement and cuing for set up and hand placement - able to rise with heavy use of rails and only light assist to rise.  Pt did need max assist  to safely land on bed after initial failed attempt at walking.  Ambulation/Gait Ambulation/Gait assistance: Max assist Ambulation Distance (Feet): 3 Feet Assistive device: Rolling walker (2 wheeled)       General Gait Details: Pt was unsafe with both efforts of moving what standing.  He was unable to take weight through L foot effectively and was not safe with LOBs and abrupt/unsafe attempts at sitting before aligned with sitting surface.  Pt needed considerable assist just to remain safe/upright after just a step or 2.  Stairs            Wheelchair Mobility    Modified Rankin (Stroke Patients Only)       Balance Overall balance assessment: Needs assistance Sitting-balance support: No upper extremity supported Sitting balance-Leahy Scale: Good     Standing balance support: Bilateral upper extremity supported Standing balance-Leahy Scale: Poor Standing balance comment: Pt unable to take weight through L heel and ultimately showed poor balance and awareness with standing.                             Pertinent Vitals/Pain Pain Assessment: 0-10 Pain Score: 4  Pain Location: L foot Pain Intervention(s): Limited activity within patient's tolerance    Home Living Family/patient expects to be discharged to:: [place]  Prior Function Level of Independence: Independent with assistive device(s)         Comments: Pt lives at group home where he apparently normally he is able to be active and independent.       Hand Dominance        Extremity/Trunk Assessment   Upper Extremity Assessment: Overall WFL for tasks assessed           Lower Extremity Assessment: Generalized weakness;Overall WFL for tasks assessed (hesitant/limited with L AROM in general and especially ankle)         Communication   Communication: No difficulties  Cognition Arousal/Alertness:  Awake/alert Behavior During Therapy: Impulsive Overall Cognitive Status:  (appears close to baseline, has a psych history)                      General Comments      Exercises     Assessment/Plan    PT Assessment Patient needs continued PT services  PT Problem List Decreased strength;Decreased activity tolerance;Decreased range of motion;Decreased balance;Decreased mobility;Decreased coordination;Decreased knowledge of use of DME;Decreased safety awareness          PT Treatment Interventions DME instruction;Gait training;Functional mobility training;Therapeutic activities;Therapeutic exercise;Balance training;Neuromuscular re-education;Patient/family education;Cognitive remediation    PT Goals (Current goals can be found in the Care Plan section)  Acute Rehab PT Goals Patient Stated Goal: get back to walking w/o the walker PT Goal Formulation: With patient Time For Goal Achievement: 11/02/16 Potential to Achieve Goals: Fair    Frequency Min 2X/week   Barriers to discharge        Co-evaluation               End of Session Equipment Utilized During Treatment: Gait belt Activity Tolerance: Patient limited by pain Patient left: with chair alarm set;with call bell/phone within reach Nurse Communication: Mobility status         Time: [id number] PT Time Calculation (min) (ACUTE ONLY): 20 min   Charges:   PT Evaluation $PT Eval Low Complexity: 1 Procedure     PT G CodesKreg Shropshire, DPT 10/19/2016, 10:31 AM

## 2016-10-19 NOTE — Progress Notes (Addendum)
[place] [facility] [place] NAME: Brandon Maynard    MR#:  [id number]  DATE OF BIRTH:  11-Apr-1956  SUBJECTIVE: Patient denies any complaints. Patient had a triple lumen catheter on the left side of the neck , hemoglobin at 6.3. Patient denies any shortness of breath   Came in with left leg swelling and erythema   REVIEW OF SYSTEMS:   Review of Systems  Constitutional: Negative for chills, fever and weight loss.  HENT: Negative for ear discharge, ear pain and nosebleeds.   Eyes: Negative for blurred vision, pain and discharge.  Respiratory: Negative for sputum production, shortness of breath, wheezing and stridor.   Cardiovascular: Negative for chest pain, palpitations, orthopnea, leg swelling and PND.  Gastrointestinal: Negative for abdominal pain, diarrhea, nausea and vomiting.  Genitourinary: Negative for frequency and urgency.  Musculoskeletal: Negative for back pain and joint pain.  Neurological: Negative for sensory change, speech change, focal weakness and weakness.  Psychiatric/Behavioral: Negative for depression and hallucinations. The patient is not nervous/anxious.    Tolerating Diet:yes  DRUG ALLERGIES:   Allergies  Allergen Reactions  . Tetracyclines & Related Hives    VITALS:  Blood pressure 111/67, pulse 72, temperature 98.5 F (36.9 C), temperature source Oral, resp. rate 20, height 6\' 3"  (1.905 m), weight 91.6 kg (202 lb), SpO2 100 %.  PHYSICAL EXAMINATION:   Physical Exam  GENERAL:  60 y.o.-year-old patient lying in the bed with no acute distress.  EYES: Pupils equal, round, reactive to light and accommodation. No scleral icterus. Extraocular muscles intact.  HEENT: Head atraumatic, normocephalic. Oropharynx and nasopharynx clear.  NECK:  Supple, no jugular venous distention. No thyroid enlargement, no tenderness.  LUNGS: Normal breath sounds bilaterally, no wheezing, rales, rhonchi. No use of accessory muscles of  respiration.  CARDIOVASCULAR: S1, S2 normal. No murmurs, rubs, or gallops.  ABDOMEN: Soft, nontender, nondistended. Bowel sounds present. No organomegaly or mass.  EXTREMITIES: dressing  Present for left foot. NEUROLOGIC;Alert,awake,oriented during my visit. PSYCHIATRIC: Alert,awake,oriented,  LABORATORY PANEL:  CBC  Recent Labs Lab 10/19/16 0630  WBC 7.8  HGB 6.3*  HCT 18.6*  PLT 154    Chemistries   Recent Labs Lab 10/19/16 0630  NA 134*  K 4.7  CL 110  CO2 18*  GLUCOSE 117*  BUN 75*  CREATININE 4.68*  CALCIUM 8.3*   Cardiac Enzymes No results for input(s): TROPONINI in the last 168 hours. RADIOLOGY:  No results found. ASSESSMENT AND PLAN:   Brandon Maynard  is a 60 y.o. male with a known history of Bipolar disorder, cataract, chronic thrombocytopenia is presenting to the ED with a chief complaint of significant worsening of left lower extremity pain, redness and swelling which started with a small blister on the left foot. 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  #Sepsis meets criteria with leukocytosis, elevated lactic acid and hypotension. Clinically improved  Secondary to left leg cellulitis On   antibiotics  Ancef, patient has Escherichia coli bacteremia . WBC improved  . Dr Ronalee Belts does not feel any need for intervention for PAD. CT tibia-fibula shows inflammation and swelling. No loculated collection of fluid. rpt blood cultures from 11/15 negative,wound cultures are negative.  As per ID patient needs total of 14 days Ancef, changed to Keflex for another  week.   #Delirium with past medical history of paranoid schizophrenia and bipolar disorder . Depakote is stopped because of concern for thrombocytopenia.psych is following  . #Hyponatremia Resolved  #Thrombocytopenia- improving.  -plt 1 54,000 -multifactorial. Pt has been evaluated by Oncology in the past and appears due to meds/sepsis and CKD -Depakote has been discontinued by Dr clapacs Repeat CBC in a.m.  # DVT prophylaxis with SCDs and resume Lovenox from tomorrow if the hemoglobin is stable  # Acute on chronic anemia;;s/p  One unit blood transfusion. Today's hemoglobin is at 6.3 we will transfuse 1 more unit today and check CBC in a.m.  Case discussed with Care Management/Social Worker. Management plans discussed with the patient, family and they are in agreement.  CODE STATUS:FULL  DVT Prophylaxis: SCD  TOTAL TIME TAKING CARE OF THIS PATIENT 33 minutes.   Note: This dictation was prepared with Dragon dictation along with smaller phrase technology. Any transcriptional errors that result from this process are unintentional.  Brandon Maynard M.D on 10/19/2016 at 2:48 PM  Between 7am to 6pm - Pager - [phone]  After 6pm go to [email or web address] - password [email or web address] [facility]  [facility]  Office  [phone]  CC: Primary care physician; [name], MD

## 2016-10-19 NOTE — Progress Notes (Signed)
[facility]  ROUNDING NOTE   Subjective:   S Cr still critically high but slight improvement today to 4.68.  BUN down to 75 from 80. No shortness of breath No edema good urine output However, hemoglobin noted to be low at 6.3 Albumin also severely low at 1.5  Objective:  Vital signs in last 24 hours:  Temp:  [98.5 F (36.9 C)] 98.5 F (36.9 C) (11/20 2112) Pulse Rate:  [72] 72 (11/20 2112) Resp:  [20] 20 (11/20 2112) BP: (111)/(67) 111/67 (11/20 2112) SpO2:  [100 %] 100 % (11/20 2112)  Weight change:  Filed Weights   10/13/16 1350 10/13/16 1705 10/14/16 1504  Weight: 91.7 kg (202 lb 2.6 oz) 91.7 kg (202 lb 2.6 oz) 91.6 kg (202 lb)    Intake/Output: I/O last 3 completed shifts: In: 54 [P.O.:420; IV Piggyback:50] Out: 2825 [Urine:2825]   Intake/Output this shift:  Total I/O In: 120 [P.O.:120] Out: 800 [Urine:800]  Physical Exam: General: Laying in bed  Head: Normocephalic, atraumatic. Moist oral mucosal membranes  Eyes: Anicteric  Neck: Supple, trachea midline  Lungs:  Clear to auscultation, normal effort  Heart: S1S2 no rubs  Abdomen:  Soft, nontender, BS present  Extremities: trace edema left lower extremity, +erythema and clean dressings.   Neurologic: Awake, follows commands  Skin: Left foot induration  Access:  Right IJ permcath 11/16 Dr. Lucky Cowboy,    Basic Metabolic Panel:  Recent Labs Lab 10/13/16 0554  10/15/16 0534 10/16/16 0704 10/17/16 0549 10/18/16 0955 10/19/16 0630  NA 147*  < > 140 137 137 136 134*  K 5.3*  < > 4.8 4.9 4.8 4.7 4.7  CL 118*  < > 110 110 111 109 110  CO2 21*  < > 24 19* 20* 19* 18*  GLUCOSE 98  < > 94 97 121* 148* 117*  BUN 76*  < > 65* 67* 75* 80* 75*  CREATININE 4.63*  < > 4.13* 4.39* 4.61* 4.71* 4.68*  CALCIUM 7.9*  < > 7.8* 7.7* 7.8* 8.1* 8.3*  PHOS 6.8*  --  5.6* 5.3* 4.2 4.9*  --   < > = values in this interval not displayed.  Liver Function Tests:  Recent Labs Lab 10/13/16 0554 10/15/16 0534  10/16/16 0704 10/17/16 0549 10/18/16 0955  ALBUMIN 1.4* 1.4* 1.3* 1.5* 1.5*   No results for input(s): LIPASE, AMYLASE in the last 168 hours. No results for input(s): AMMONIA in the last 168 hours.  CBC:  Recent Labs Lab 10/13/16 1400 10/14/16 0604 10/14/16 1912 10/15/16 0534 10/19/16 0630  WBC 13.1* 10.9*  --  12.1* 7.8  HGB 7.6* 6.3* 8.3* 7.6* 6.3*  HCT 23.7* 18.7* 24.9* 23.0* 18.6*  MCV 92.9 91.6  --  92.5 88.7  PLT 136* 110*  --  123* 154    Cardiac Enzymes: No results for input(s): CKTOTAL, CKMB, CKMBINDEX, TROPONINI in the last 168 hours.  BNP: Invalid input(s): POCBNP  CBG: No results for input(s): GLUCAP in the last 168 hours.  Microbiology: Results for orders placed or performed during the hospital encounter of 10/04/16  Blood Culture (routine x 2)     Status: Abnormal   Collection Time: 10/04/16  3:51 PM  Result Value Ref Range Status   Specimen Description BLOOD RT [facility]  Final   Special Requests BOTTLES DRAWN AEROBIC AND ANAEROBIC 10CC  Final   Culture  Setup Time   Final    Organism ID to follow GRAM NEGATIVE RODS IN BOTH AEROBIC AND ANAEROBIC BOTTLES CRITICAL RESULT  CALLED TO, READ BACK BY AND VERIFIED WITH: NATE COOKSON ON 10/05/16 AT 0448 BY TLB CONFIRMED BY TLB    Culture ESCHERICHIA COLI (A)  Final   Report Status 10/07/2016 FINAL  Final   Organism ID, Bacteria ESCHERICHIA COLI  Final      Susceptibility   Escherichia coli - MIC*    AMPICILLIN >=32 RESISTANT Resistant     CEFAZOLIN <=4 SENSITIVE Sensitive     CEFEPIME <=1 SENSITIVE Sensitive     CEFTAZIDIME <=1 SENSITIVE Sensitive     CEFTRIAXONE <=1 SENSITIVE Sensitive     CIPROFLOXACIN <=0.25 SENSITIVE Sensitive     GENTAMICIN <=1 SENSITIVE Sensitive     IMIPENEM <=0.25 SENSITIVE Sensitive     TRIMETH/SULFA >=320 RESISTANT Resistant     AMPICILLIN/SULBACTAM 16 INTERMEDIATE Intermediate     PIP/TAZO <=4 SENSITIVE Sensitive     Extended ESBL NEGATIVE Sensitive     * ESCHERICHIA COLI   Blood Culture ID Panel (Reflexed)     Status: Abnormal   Collection Time: 10/04/16  3:51 PM  Result Value Ref Range Status   Enterococcus species NOT DETECTED NOT DETECTED Final   Listeria monocytogenes NOT DETECTED NOT DETECTED Final   Staphylococcus species NOT DETECTED NOT DETECTED Final   Staphylococcus aureus NOT DETECTED NOT DETECTED Final   Streptococcus species NOT DETECTED NOT DETECTED Final   Streptococcus agalactiae NOT DETECTED NOT DETECTED Final   Streptococcus pneumoniae NOT DETECTED NOT DETECTED Final   Streptococcus pyogenes NOT DETECTED NOT DETECTED Final   Acinetobacter baumannii NOT DETECTED NOT DETECTED Final   Enterobacteriaceae species DETECTED (A) NOT DETECTED Final    Comment: CRITICAL RESULT CALLED TO, READ BACK BY AND VERIFIED WITH: NATE COOKSON ON 10/05/16 AT 0448 BY TLB    Enterobacter cloacae complex NOT DETECTED NOT DETECTED Final   Escherichia coli DETECTED (A) NOT DETECTED Final    Comment: CRITICAL RESULT CALLED TO, READ BACK BY AND VERIFIED WITH: NATE COOKSON ON 10/05/16 AT 0448 BY TLB                                                                                                Klebsiella oxytoca NOT DETECTED NOT DETECTED Final   Klebsiella pneumoniae NOT DETECTED NOT DETECTED Final   Proteus species NOT DETECTED NOT DETECTED Final   Serratia marcescens NOT DETECTED NOT DETECTED Final   Carbapenem resistance NOT DETECTED NOT DETECTED Final   Haemophilus influenzae NOT DETECTED NOT DETECTED Final   Neisseria meningitidis NOT DETECTED NOT DETECTED Final   Pseudomonas aeruginosa NOT DETECTED NOT DETECTED Final   Candida albicans NOT DETECTED NOT DETECTED Final   Candida glabrata NOT DETECTED NOT DETECTED Final   Candida krusei NOT DETECTED NOT DETECTED Final   Candida parapsilosis NOT DETECTED NOT DETECTED Final   Candida tropicalis NOT DETECTED NOT DETECTED Final  Blood Culture (routine x 2)     Status: Abnormal   Collection Time: 10/04/16  3:56 PM   Result Value Ref Range Status   Specimen Description BLOOD LT [facility]  Final   Special Requests BOTTLES DRAWN AEROBIC AND ANAEROBIC 10CC  Final  Culture  Setup Time   Final    GRAM NEGATIVE RODS IN BOTH AEROBIC AND ANAEROBIC BOTTLES CRITICAL RESULT CALLED TO, READ BACK BY AND VERIFIED WITH: NATE COOKSON ON 10/05/16 AT 0448 BY TLB CONFIRMED BY TLB    Culture ESCHERICHIA COLI (A)  Final   Report Status 10/07/2016 FINAL  Final  Urine culture     Status: Abnormal   Collection Time: 10/04/16  9:01 PM  Result Value Ref Range Status   Specimen Description URINE, RANDOM  Final   Special Requests NONE  Final   Culture 30,000 COLONIES/mL ESCHERICHIA COLI (A)  Final   Report Status 10/07/2016 FINAL  Final   Organism ID, Bacteria ESCHERICHIA COLI (A)  Final      Susceptibility   Escherichia coli - MIC*    AMPICILLIN >=32 RESISTANT Resistant     CEFAZOLIN <=4 SENSITIVE Sensitive     CEFTRIAXONE <=1 SENSITIVE Sensitive     CIPROFLOXACIN <=0.25 SENSITIVE Sensitive     GENTAMICIN <=1 SENSITIVE Sensitive     IMIPENEM <=0.25 SENSITIVE Sensitive     NITROFURANTOIN <=16 SENSITIVE Sensitive     TRIMETH/SULFA >=320 RESISTANT Resistant     AMPICILLIN/SULBACTAM 16 INTERMEDIATE Intermediate     PIP/TAZO <=4 SENSITIVE Sensitive     Extended ESBL NEGATIVE Sensitive     * 30,000 COLONIES/mL ESCHERICHIA COLI  MRSA PCR Screening     Status: None   Collection Time: 10/04/16  9:01 PM  Result Value Ref Range Status   MRSA by PCR NEGATIVE NEGATIVE Final    Comment:        The GeneXpert MRSA Assay (FDA approved for NASAL specimens only), is one component of a comprehensive MRSA colonization surveillance program. It is not intended to diagnose MRSA infection nor to guide or monitor treatment for MRSA infections.   MRSA PCR Screening     Status: None   Collection Time: 10/06/16 11:59 AM  Result Value Ref Range Status   MRSA by PCR NEGATIVE NEGATIVE Final    Comment:        The GeneXpert MRSA Assay  (FDA approved for NASAL specimens only), is one component of a comprehensive MRSA colonization surveillance program. It is not intended to diagnose MRSA infection nor to guide or monitor treatment for MRSA infections.   Aerobic Culture (superficial specimen)     Status: None   Collection Time: 10/11/16  4:03 PM  Result Value Ref Range Status   Specimen Description LEG  Final   Special Requests Normal  Final   Gram Stain NO WBC SEEN NO ORGANISMS SEEN   Final   Culture   Final    NO GROWTH 3 DAYS Performed at [facility]    Report Status 10/14/2016 FINAL  Final  CULTURE, BLOOD (ROUTINE X 2) w Reflex to ID Panel     Status: None   Collection Time: 10/13/16  9:33 AM  Result Value Ref Range Status   Specimen Description BLOOD RIGHT HAND  Final   Special Requests   Final    BOTTLES DRAWN AEROBIC AND ANAEROBIC [place]   Culture NO GROWTH 5 DAYS  Final   Report Status 10/18/2016 FINAL  Final  CULTURE, BLOOD (ROUTINE X 2) w Reflex to ID Panel     Status: None   Collection Time: 10/13/16  9:40 AM  Result Value Ref Range Status   Specimen Description BLOOD LEFT ASSIST CONTROL  Final   Special Requests   Final    BOTTLES DRAWN AEROBIC AND  ANAEROBIC 10CCAERO,5CCANA   Culture NO GROWTH 5 DAYS  Final   Report Status 10/18/2016 FINAL  Final    Coagulation Studies: No results for input(s): LABPROT, INR in the last 72 hours.  Urinalysis: No results for input(s): COLORURINE, LABSPEC, PHURINE, GLUCOSEU, HGBUR, BILIRUBINUR, KETONESUR, PROTEINUR, UROBILINOGEN, NITRITE, LEUKOCYTESUR in the last 72 hours.  Invalid input(s): APPERANCEUR    Imaging: Dg Chest 1 View  Result Date: 10/17/2016 CLINICAL DATA:  New line placement. EXAM: CHEST 1 VIEW COMPARISON:  Apr 23, 2010 FINDINGS: A mechanical device overlies the chest limiting evaluation. A new double lumen central line terminates near the caval atrial junction. A single-lumen left central line terminates in the SVC. No  pneumothorax. Atelectasis seen in the bases. The heart, hila, and mediastinum are unremarkable. IMPRESSION: Central lines are in good position with no pneumothorax. Bibasilar atelectasis. Electronically Signed   By: Dorise Bullion III M.D   On: 10/17/2016 14:40     Medications:    . asenapine  10 mg Sublingual BID  . brimonidine  1 drop Both Eyes Q12H   And  . timolol  1 drop Both Eyes Q12H  .  ceFAZolin (ANCEF) IV  1 g Intravenous q1800  . docusate sodium  100 mg Oral BID  . feeding supplement (NEPRO CARB STEADY)  237 mL Oral BID BM  . FLUoxetine  40 mg Oral Daily  . latanoprost  1 drop Both Eyes QHS  . levothyroxine  50 mcg Oral QAC breakfast  . multivitamin with minerals  1 tablet Oral Daily  . polyethylene glycol  17 g Oral Daily  . pyridOXINE  100 mg Oral BID  . senna  1 tablet Oral Daily  . silver sulfADIAZINE   Topical BID  . simvastatin  20 mg Oral QHS  . traZODone  100 mg Oral QHS   sodium chloride, sodium chloride, acetaminophen, alteplase, alum & mag hydroxide-simeth, haloperidol lactate, heparin, lidocaine (PF), lidocaine-prilocaine, ondansetron **OR** ondansetron (ZOFRAN) IV, oxyCODONE, pentafluoroprop-tetrafluoroeth  Assessment/ Plan:  59 y.o. white male with past medication history of anemia, bipolar disorder, degenerative disc disease, GERD, hyperlipidemia, hypertension, paranoid schizophrenia, who was admitted to [facility] on 10/04/2016 for evaluation of left leg swelling and redness.   1.  Acute renal failure on chronic kidney disease stage III: baseline creatinine of 2.1 EGFR 32 with proteinuria Acute renal failure secondary to sepsis, hypotension and ATN Requiring renal replacement. Two dialysis treatments 11/11 and 11/12.  Nonoliguric urine output. Chronic kidney disease secondary to hypertension  - Last Hemodialysis 11/15 due to hyperkalemia, and metabolic acidosis.  - Monitor daily for dialysis need.  - serum creatininestarting to turn around.  Continue to  monitor 1-2 days.  If trend continues, he may be able to be discharged without dialysis.  2.  Hypotension: blood pressure has improved. Secondary to sepsis.  Takes labetalol at home. Hold all blood pressure agents for now.   3.  Left lower extremity cellulitis: with sepsis. E coli from blood cultures 11/6  Leukocytosis improving. Afebrile - cefazolin   - repeat blood cultures negative.  - Appreciate vascular input.   4. Anemia and thrombocytopenia: hemoglobin low. Platelets improved off valproic acid Hematology consulted on 11/9 PRBC transfusion on 11/16 Hemoglobin level again at 6.3 today  5. Catheter: now with RIJ permcath.  Femoral trialysis has been removed  6/ Disposition To be determined based on continued need for Hemodialysis - Renal function is expected to improve - UOP is good - No acute indication for HD at present,  will continue to monitor   LOS: 15 [place] 11/21/20172:25 PM

## 2016-10-20 LAB — BASIC METABOLIC PANEL
Anion gap: 6 (ref 5–15)
BUN: 81 mg/dL — ABNORMAL HIGH (ref 6–20)
CO2: 19 mmol/L — ABNORMAL LOW (ref 22–32)
Calcium: 8.1 mg/dL — ABNORMAL LOW (ref 8.9–10.3)
Chloride: 108 mmol/L (ref 101–111)
Creatinine, Ser: 4.44 mg/dL — ABNORMAL HIGH (ref 0.61–1.24)
GFR calc Af Amer: 15 mL/min — ABNORMAL LOW (ref >60)
GFR calc non Af Amer: 13 mL/min — ABNORMAL LOW (ref >60)
Glucose, Bld: 108 mg/dL — ABNORMAL HIGH (ref 65–99)
Potassium: 4.4 mmol/L (ref 3.5–5.1)
Sodium: 133 mmol/L — ABNORMAL LOW (ref 135–145)

## 2016-10-20 LAB — CBC
HCT: 20.5 % — ABNORMAL LOW (ref 40.0–52.0)
Hemoglobin: 7.2 g/dL — ABNORMAL LOW (ref 13.0–18.0)
MCH: 30.8 pg (ref 26.0–34.0)
MCHC: 35.1 g/dL (ref 32.0–36.0)
MCV: 87.8 fL (ref 80.0–100.0)
Platelets: 151 10*3/uL (ref 150–440)
RBC: 2.33 MIL/uL — ABNORMAL LOW (ref 4.40–5.90)
RDW: 13.7 % (ref 11.5–14.5)
WBC: 7.1 10*3/uL (ref 3.8–10.6)

## 2016-10-20 LAB — TYPE AND SCREEN
ABO/RH(D): O POS
ANTIBODY SCREEN: NEGATIVE
UNIT DIVISION: 0

## 2016-10-20 MED ORDER — EPOETIN ALFA 20000 UNIT/ML IJ SOLN: 20000.0000 [IU] | Freq: Once | INTRAMUSCULAR | Status: DC

## 2016-10-20 MED ORDER — EPOETIN ALFA 10000 UNIT/ML IJ SOLN
20000.0000 [IU] | Freq: Once | INTRAMUSCULAR | Status: AC
  Administered 2016-10-20: 20000 [IU] via SUBCUTANEOUS
  Filled 2016-10-20 (×2): qty 2

## 2016-10-20 NOTE — Care Management (Signed)
Notified Alda Lea 10/19/16 that per Dr. Candiss Norse outpatient HD set up was to be put on hold.  It is now anticipated that he will not need HD at discharge.  PT has assessed patient and currently recommending SNF. If patient returns to group home, may would benefit from Home health services.

## 2016-10-20 NOTE — Consult Note (Signed)
[facility] Consult   Reason for Consult:  Follow-up consult for 60 year old man with a history of chronic mental illness currently in the hospital with acute and complicated medical problems stemming from probably sepsis from a cellulitis and labile blood pressures. Updated problem as of today he is assessing capacity for decision-making Referring Physician:  Patel/Lateef Patient Identification: Brandon T Klaus Jr. MRN:  [id number] Principal Diagnosis: Acute delirium Diagnosis:   Patient Active Problem List   Diagnosis Date Noted  . Acute delirium [R41.0] 10/05/2016  . Left leg cellulitis [H60.737] 10/04/2016  . B12 deficiency [E53.8] 07/06/2016  . Anemia [D64.9] 06/16/2016  . Thrombocytopenia ([place]) [D69.6] 06/16/2016  . Weight loss [R63.4] 06/16/2016  . Esophageal reflux [K21.9] 05/12/2016  . 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 Schizoaffective disorder ([place]) [F25.9] 06/13/2014    Total Time spent with patient: 20 minutes  Subjective:   Brandon Maynard. is a 60 y.o. male patient admitted with "I'm not feeling good".  HPI:  Follow-up for Wednesday the 22nd. Patient seen. Chart reviewed. Spoke with nephrology. Patient had no new complaint. He says that he is gradually feeling better. Eating well. He says he is looking forward to a visit today from someone who might take him for a walk downstairs to the lobby and bring him some new clothes. He still is a little disorganized in his speech but with redirection is able to stay enough on topic to answer questions. He says he is not feeling grumpy or angry today and not having as many explosive episodes.  Past Psychiatric History: Chronic schizophrenia or schizoaffective disorder. Recently we have discontinued his Depakote out of concern for it worsening his thrombocytopenia and medical problems. He is still on his antipsychotic.  Risk to Self: Is patient at  risk for suicide?: No Risk to Others:   Prior Inpatient Therapy:   Prior Outpatient Therapy:    Past Medical History:  Past Medical History:  Diagnosis Date  . Anemia   . Bipolar disorder ([place])   . Cancer ([place])   . Cataract    right eye  . DDD (degenerative disc disease)   . GERD (gastroesophageal reflux disease)   . Heart murmur   . Hyperlipidemia   . Hypertension   . Paranoid schizophrenia ([place])   . 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 Thyroid disease     Past Surgical History:  Procedure Laterality Date  . Dermal Abrasion  1973  . PERIPHERAL VASCULAR CATHETERIZATION N/A 10/14/2016   Procedure: Dialysis/Perma Catheter Insertion;  Surgeon: Algernon Huxley, MD;  Location: [facility];  Service: Cardiovascular;  Laterality: N/A;   Family History:  Family History  Problem Relation Age of Onset  . Brain cancer Maternal Uncle   . Breast cancer Mother   . Melanoma Mother   . Congestive Heart Failure Father   . Melanoma Father   . Breast cancer Maternal Aunt   . Diabetes Maternal Aunt    Family Psychiatric  History: No family history identified. Social History:  History  Alcohol Use No     History  Drug Use No    Social History   Social History  . Marital status: Single    Spouse name: N/A  . Number of children: N/A  . Years of education: N/A   Social History Main Topics  . Smoking status: Current Every Day Smoker    Last attempt to quit: 04/09/1991  . Smokeless tobacco: Never  Used  . Alcohol use No  . Drug use: No  . Sexual activity: Not Asked   Other Topics Concern  . None   Social History Narrative  . None   Additional Social History:Patient's brother, who lives in [place], is the only relative actively involved in making decisions. I am told by nephrology that the brother has been contacted and expresses agreement with proceeding with dialysis. Unfortunately he has not been made the healthcare power of attorney or guardian at this point.     Allergies:   Allergies  Allergen Reactions  . Tetracyclines & Related Hives    Labs:  Results for orders placed or performed during the hospital encounter of 10/04/16 (from the past 48 hour(s))  Basic metabolic panel     Status: Abnormal   Collection Time: 10/19/16  6:30 AM  Result Value Ref Range   Sodium 134 (L) 135 - 145 mmol/L   Potassium 4.7 3.5 - 5.1 mmol/L   Chloride 110 101 - 111 mmol/L   CO2 18 (L) 22 - 32 mmol/L   Glucose, Bld 117 (H) 65 - 99 mg/dL   BUN 75 (H) 6 - 20 mg/dL   Creatinine, Ser 4.68 (H) 0.61 - 1.24 mg/dL   Calcium 8.3 (L) 8.9 - 10.3 mg/dL   GFR calc non Af Amer 12 (L) >60 mL/min   GFR calc Af Amer 14 (L) >60 mL/min    Comment: (NOTE) The eGFR has been calculated using the CKD EPI equation. This calculation has not been validated in all clinical situations. eGFR's persistently <60 mL/min signify possible Chronic Kidney Disease.    Anion gap 6 5 - 15  CBC     Status: Abnormal   Collection Time: 10/19/16  6:30 AM  Result Value Ref Range   WBC 7.8 3.8 - 10.6 K/uL   RBC 2.10 (L) 4.40 - 5.90 MIL/uL   Hemoglobin 6.3 (L) 13.0 - 18.0 g/dL   HCT 18.6 (L) 40.0 - 52.0 %   MCV 88.7 80.0 - 100.0 fL   MCH 30.1 26.0 - 34.0 pg   MCHC 33.9 32.0 - 36.0 g/dL   RDW 13.8 11.5 - 14.5 %   Platelets 154 150 - 440 K/uL  Type and screen     Status: None   Collection Time: 10/19/16  2:51 PM  Result Value Ref Range   ABO/RH(D) O POS    Antibody Screen NEG    Sample Expiration 10/22/2016    Unit Number [id number]    Blood Component Type RBC LR PHER1    Unit division 00    Status of Unit ISSUED,FINAL    Transfusion Status OK TO TRANSFUSE    Crossmatch Result Compatible   Prepare RBC     Status: None   Collection Time: 10/19/16  2:51 PM  Result Value Ref Range   Order Confirmation ORDER PROCESSED BY BLOOD BANK   Hemoglobin and hematocrit, blood     Status: Abnormal   Collection Time: 10/19/16  2:51 PM  Result Value Ref Range   Hemoglobin 6.7 (L) 13.0 - 18.0  g/dL   HCT 19.8 (L) 40.0 - 52.0 %  Hemoglobin and hematocrit, blood     Status: Abnormal   Collection Time: 10/19/16 11:15 PM  Result Value Ref Range   Hemoglobin 6.8 (L) 13.0 - 18.0 g/dL   HCT 19.9 (L) 40.0 - 52.0 %  CBC     Status: Abnormal   Collection Time: 10/20/16  4:50 AM  Result Value Ref Range  WBC 7.1 3.8 - 10.6 K/uL   RBC 2.33 (L) 4.40 - 5.90 MIL/uL   Hemoglobin 7.2 (L) 13.0 - 18.0 g/dL   HCT 20.5 (L) 40.0 - 52.0 %   MCV 87.8 80.0 - 100.0 fL   MCH 30.8 26.0 - 34.0 pg   MCHC 35.1 32.0 - 36.0 g/dL   RDW 13.7 11.5 - 14.5 %   Platelets 151 150 - 440 K/uL  Basic metabolic panel     Status: Abnormal   Collection Time: 10/20/16  4:50 AM  Result Value Ref Range   Sodium 133 (L) 135 - 145 mmol/L   Potassium 4.4 3.5 - 5.1 mmol/L   Chloride 108 101 - 111 mmol/L   CO2 19 (L) 22 - 32 mmol/L   Glucose, Bld 108 (H) 65 - 99 mg/dL   BUN 81 (H) 6 - 20 mg/dL   Creatinine, Ser 4.44 (H) 0.61 - 1.24 mg/dL   Calcium 8.1 (L) 8.9 - 10.3 mg/dL   GFR calc non Af Amer 13 (L) >60 mL/min   GFR calc Af Amer 15 (L) >60 mL/min    Comment: (NOTE) The eGFR has been calculated using the CKD EPI equation. This calculation has not been validated in all clinical situations. eGFR's persistently <60 mL/min signify possible Chronic Kidney Disease.    Anion gap 6 5 - 15    Current Facility-Administered Medications  Medication Dose Route Frequency Provider Last Rate Last Dose  . 0.9 %  sodium chloride infusion  100 mL Intravenous PRN Munsoor Lateef, MD      . 0.9 %  sodium chloride infusion  100 mL Intravenous PRN Munsoor Lateef, MD      . acetaminophen (TYLENOL) tablet 650 mg  650 mg Oral Q6H PRN Fritzi Mandes, MD   650 mg at 10/20/16 1417  . alteplase (CATHFLO ACTIVASE) injection 2 mg  2 mg Intracatheter Once PRN Munsoor Lateef, MD      . alum & mag hydroxide-simeth (MAALOX/MYLANTA) 200-200-20 MG/5ML suspension 30 mL  30 mL Oral Q6H PRN Epifanio Lesches, MD   30 mL at 10/18/16 2337  . asenapine  (SAPHRIS) sublingual tablet 10 mg  10 mg Sublingual BID Gonzella Lex, MD   10 mg at 10/20/16 1418  . brimonidine (ALPHAGAN) 0.2 % ophthalmic solution 1 drop  1 drop Both Eyes Q12H Aruna Gouru, MD   1 drop at 10/20/16 1059   And  . timolol (TIMOPTIC) 0.5 % ophthalmic solution 1 drop  1 drop Both Eyes Q12H Nicholes Mango, MD   1 drop at 10/20/16 1059  . ceFAZolin (ANCEF) IVPB 1 g/50 mL premix  1 g Intravenous q1800 Napoleon Form, RPH   1 g at 10/19/16 1738  . docusate sodium (COLACE) capsule 100 mg  100 mg Oral BID Nicholes Mango, MD   100 mg at 10/20/16 1059  . epoetin alfa (EPOGEN,PROCRIT) injection 20,000 Units  20,000 Units Subcutaneous Once Lytle Butte, MD      . FLUoxetine (PROZAC) capsule 40 mg  40 mg Oral Daily Nicholes Mango, MD   40 mg at 10/20/16 1058  . haloperidol lactate (HALDOL) injection 2 mg  2 mg Intravenous Q6H PRN Gonzella Lex, MD   2 mg at 10/14/16 0253  . heparin injection 1,000 Units  1,000 Units Dialysis PRN Munsoor Lateef, MD      . latanoprost (XALATAN) 0.005 % ophthalmic solution 1 drop  1 drop Both Eyes QHS Nicholes Mango, MD   1 drop at 10/19/16 2247  .  levothyroxine (SYNTHROID, LEVOTHROID) tablet 50 mcg  50 mcg Oral QAC breakfast Nicholes Mango, MD   50 mcg at 10/20/16 0800  . lidocaine (PF) (XYLOCAINE) 1 % injection 5 mL  5 mL Intradermal PRN Munsoor Lateef, MD      . lidocaine-prilocaine (EMLA) cream 1 application  1 application Topical PRN Munsoor Lateef, MD      . multivitamin with minerals tablet 1 tablet  1 tablet Oral Daily Nicholes Mango, MD   1 tablet at 10/20/16 1059  . ondansetron (ZOFRAN) tablet 4 mg  4 mg Oral Q6H PRN Nicholes Mango, MD       Or  . ondansetron (ZOFRAN) injection 4 mg  4 mg Intravenous Q6H PRN Nicholes Mango, MD   4 mg at 10/14/16 1615  . oxyCODONE (Oxy IR/ROXICODONE) immediate release tablet 5 mg  5 mg Oral Q6H PRN Fritzi Mandes, MD   5 mg at 10/20/16 0407  . pentafluoroprop-tetrafluoroeth (GEBAUERS) aerosol 1 application  1 application Topical PRN Munsoor  Lateef, MD      . polyethylene glycol (MIRALAX / GLYCOLAX) packet 17 g  17 g Oral Daily Nicholes Mango, MD   17 g at 10/20/16 1100  . pyridOXINE (VITAMIN B-6) tablet 100 mg  100 mg Oral BID Nicholes Mango, MD   100 mg at 10/20/16 1107  . senna (SENOKOT) tablet 8.6 mg  1 tablet Oral Daily Nicholes Mango, MD   8.6 mg at 10/20/16 1059  . silver sulfADIAZINE (SILVADENE) 1 % cream   Topical BID Fritzi Mandes, MD      . simvastatin (ZOCOR) tablet 20 mg  20 mg Oral QHS Nicholes Mango, MD   20 mg at 10/19/16 2246  . traZODone (DESYREL) tablet 100 mg  100 mg Oral QHS Nicholes Mango, MD   100 mg at 10/19/16 2246   Facility-Administered Medications Ordered in Other Encounters  Medication Dose Route Frequency Provider Last Rate Last Dose  . cyanocobalamin ((VITAMIN B-12)) injection 1,000 mcg  1,000 mcg Intramuscular Once Lequita Asal, MD        Musculoskeletal: Strength & Muscle Tone: decreased Gait & Station: unable to stand Patient leans: Backward  Psychiatric Specialty Exam: Physical Exam  Nursing note and vitals reviewed. Constitutional: He appears well-developed and well-nourished.  HENT:  Head: Normocephalic and atraumatic.  Eyes: Conjunctivae are normal. Pupils are equal, round, and reactive to light.  Neck: Normal range of motion.  Cardiovascular: Normal heart sounds.   Respiratory: No respiratory distress.  GI: Soft.  Musculoskeletal: Normal range of motion.  Neurological: He is alert.  Skin: Skin is warm and dry.     Psychiatric: His affect is labile. His speech is tangential. His speech is not delayed and not slurred. He is slowed. He is not agitated. He does not express impulsivity. He expresses no suicidal ideation. He expresses no suicidal plans. He exhibits abnormal recent memory.    Review of Systems  Constitutional: Negative.   HENT: Negative.   Eyes: Negative.   Respiratory: Negative.   Cardiovascular: Negative.   Gastrointestinal: Negative.   Musculoskeletal: Positive for joint  pain and myalgias.  Skin: Negative.   Neurological: Negative.   Psychiatric/Behavioral: Negative for depression, hallucinations, memory loss, substance abuse and suicidal ideas. The patient is not nervous/anxious and does not have insomnia.     Blood pressure 130/60, pulse 75, temperature 98 F (36.7 C), temperature source Oral, resp. rate 16, height 6' 3"  (1.905 m), weight 91.6 kg (202 lb), SpO2 100 %.Body mass index is 25.25 kg/m.  General Appearance: Disheveled  Eye Contact:  None  Speech:  Garbled  Volume:  Decreased  Mood:  Irritable  Affect:  Full Range  Thought Process:  Goal Directed  Orientation:  Full (Time, Place, and Person)  Thought Content:  Patient has some disorganization of his thought but he is able to be redirected and he actually was able to discuss his current condition without reference to anything that appeared to be obviously delusional.  Suicidal Thoughts:  No  Homicidal Thoughts:  No  Memory:  Immediate;   Fair Recent;   Fair Remote;   Fair  Judgement:  Impaired  Insight:  Shallow  Psychomotor Activity:  Decreased  Concentration:  Concentration: Poor  Recall:  [employer] of Knowledge:  Fair  Language:  Fair  Akathisia:  No  Handed:  Right  AIMS (if indicated):     Assets:  Communication Skills Desire for Improvement Social Support  ADL's:  Impaired  Cognition:  Impaired,  Mild  Sleep:        Treatment Plan Summary: Daily contact with patient to assess and evaluate symptoms and progress in treatment, Medication management and Plan Continue treatment with Saphris as primary medicine and continue off of Depakote. It's not clear to me whether he will still and be in the hospital after the weekend but I will follow-up if he is. Please call psychiatrist on call over the weekend if something is needed.   Seems to be staying stable psychiatrically. No change to Saphris. Continue off of Depakote. Disposition: Supportive therapy provided about ongoing  stressors.  Alethia Berthold, MD 10/20/2016 2:51 PM

## 2016-10-20 NOTE — Consult Note (Signed)
Pharmacy Antibiotic Note  Brandon Maynard. is a 60 y.o. male admitted on 10/04/2016 with bacteremia, cellulitis and UTI.  Pharmacy has been consulted for vancomycin and meropenem dosing. Patient was treated with one dose of vanc and zosyn on 11/6. meropenem from 11/7-11/9 and keflex from 11/1- present. Patient has ecoli bacteremia and UTI (80, 000 colonies).   ID narrowing abx to cefazolin.  Per ID note continue Cefazolin for 14 days then transition to keflex for one week. Day 10  Plan: Will Continue cefazolin 1 g iv q 24 hours. Patient receiving intermittent hemodialysis as needed by nephrology; therefore will dose as if patient's Crcl is <10 ml/min (AKI).    Height: 6\' 3"  (190.5 cm) Weight: 202 lb (91.6 kg) IBW/kg (Calculated) : 84.5  Temp (24hrs), Avg:98.4 F (36.9 C), Min:97.5 F (36.4 C), Max:99.6 F (37.6 C)   Recent Labs Lab 10/13/16 1400  10/14/16 0604 10/15/16 0534 10/16/16 0704 10/17/16 0549 10/18/16 0955 10/19/16 0630 10/20/16 0450  WBC 13.1*  --  10.9* 12.1*  --   --   --  7.8 7.1  CREATININE  --   < > 3.72* 4.13* 4.39* 4.61* 4.71* 4.68* 4.44*  < > = values in this interval not displayed.  Estimated Creatinine Clearance: 20.6 mL/min (by C-G formula based on SCr of 4.44 mg/dL (H)).    Allergies  Allergen Reactions  . Tetracyclines & Related Hives    Antimicrobials this admission: vancomycin 11/6 >> 11/6, 11/12>> 11/13 zosyn 11/6 >> 11/6 Meropenem 11/7>> 11/9, 11/12>>11/13 CTX 11/9 >> 11/10 Keflex 11/10>>11/12 Cefazolin 11/13 >>  Dose adjustments this admission:   Microbiology results: 11/15 BCx x2 sent 11/13 WCx: NGTD 11/6 BCx: ecoli x2 11/6 UCx: 30,000 EColi 11/6 MRSA PCR: neg  Thank you for allowing pharmacy to be a part of this patient's care.  Loree Fee, PharmD 10/20/2016 9:59 AM

## 2016-10-20 NOTE — Progress Notes (Signed)
[facility] [place] NAME: Brandon Maynard    MRN#:  [id number]  DATE OF BIRTH:  [birth year]  SUBJECTIVE:  Hospital Day: 16 days Brandon Maynard is a 60 y.o. male presenting with Foot Pain .   Overnight events: No acute overnight events Interval Events: Numerous complaints but none of them medical, states he feels better slowly gaining his strength  REVIEW OF SYSTEMS:  CONSTITUTIONAL: No fever, fatigue or weakness.  EYES: No blurred or double vision.  EARS, NOSE, AND THROAT: No tinnitus or ear pain.  RESPIRATORY: No cough, shortness of breath, wheezing or hemoptysis.  CARDIOVASCULAR: No chest pain, orthopnea, edema.  GASTROINTESTINAL: No nausea, vomiting, diarrhea or abdominal pain.  GENITOURINARY: No dysuria, hematuria.  ENDOCRINE: No polyuria, nocturia,  HEMATOLOGY: No anemia, easy bruising or bleeding SKIN: No rash or lesion. MUSCULOSKELETAL: No joint pain or arthritis.   NEUROLOGIC: No tingling, numbness, weakness.  PSYCHIATRY: No anxiety or depression.   DRUG ALLERGIES:   Allergies  Allergen Reactions  . Tetracyclines & Related Hives    VITALS:  Blood pressure (!) 97/50, pulse 77, temperature 97.5 F (36.4 C), temperature source Oral, resp. rate 20, height 6\' 3"  (1.905 m), weight 91.6 kg (202 lb), SpO2 97 %.  PHYSICAL EXAMINATION:  VITAL SIGNS: Vitals:   10/19/16 2203 10/20/16 0453  BP: (!) 99/59 (!) 97/50  Pulse: 90 77  Resp: 16 20  Temp: 98.5 F (36.9 C) 97.5 F (36.4 C)   GENERAL:60 y.o.male currently in no acute distress.  HEAD: Normocephalic, atraumatic.  EYES: Pupils equal, round, reactive to light. Extraocular muscles intact. No scleral icterus.  MOUTH: Moist mucosal membrane. Dentition intact. No abscess noted.  EAR, NOSE, THROAT: Clear without exudates. No external lesions.  NECK: Supple. No thyromegaly. No nodules. No JVD.  PULMONARY: Clear to ascultation, without wheeze rails or rhonci. No use of accessory  muscles, Good respiratory effort. good air entry bilaterally CHEST: Nontender to palpation.  CARDIOVASCULAR: S1 and S2. Regular rate and rhythm. No murmurs, rubs, or gallops. No edema. Pedal pulses 2+ bilaterally.  GASTROINTESTINAL: Soft, nontender, nondistended. No masses. Positive bowel sounds. No hepatosplenomegaly.  MUSCULOSKELETAL: No swelling, clubbing, or edema. Range of motion full in all extremities.  NEUROLOGIC: Cranial nerves II through XII are intact. No gross focal neurological deficits. Sensation intact. Reflexes intact.  SKIN: No ulceration, lesions, rashes, or cyanosis. Skin warm and dry. Turgor intact.  PSYCHIATRIC: Mood, affect within normal limits. The patient is awake, alert and oriented x 3. Insight, judgment intact.      LABORATORY PANEL:   CBC  Recent Labs Lab 10/20/16 0450  WBC 7.1  HGB 7.2*  HCT 20.5*  PLT 151   ------------------------------------------------------------------------------------------------------------------  Chemistries   Recent Labs Lab 10/20/16 0450  NA 133*  K 4.4  CL 108  CO2 19*  GLUCOSE 108*  BUN 81*  CREATININE 4.44*  CALCIUM 8.1*   ------------------------------------------------------------------------------------------------------------------  Cardiac Enzymes No results for input(s): TROPONINI in the last 168 hours. ------------------------------------------------------------------------------------------------------------------  RADIOLOGY:  No results found.  EKG:   Orders placed or performed during the hospital encounter of 10/04/16  . ED EKG 12-Lead  . ED EKG 12-Lead  . EKG 12-Lead  . EKG 12-Lead    ASSESSMENT AND PLAN:   Brandon Maynard is a 60 y.o. male presenting with Foot Pain . Admitted 10/04/2016 : Day #: 16 days  1. Acute kidney injury on chronic kidney disease stage IV appreciate nephrology input continued improvement 2. Sepsis-improved, cellulitis-Escherichia  coli bacteremia Ancef #10/14 with  transition to Keflex #0/7 on discharge 3. Anemia of chronic disease, on received blood transfusion yesterday, stable this morning  Disposition: Patient be discharged to group home in 2 days  All the records are reviewed and case discussed with Care Management/Social Workerr. Management plans discussed with the patient, family and they are in agreement.  CODE STATUS: full TOTAL TIME TAKING CARE OF THIS PATIENT: 28 minutes.   POSSIBLE D/C IN 1-2DAYS, DEPENDING ON CLINICAL CONDITION.   Brandon Maynard,  Brandon Maynard on 10/20/2016 at 1:22 PM  Between 7am to 6pm - Pager - [phone]  After 6pm: House Pager: - [phone]  [facility]  Office  [phone]  CC: Primary care physician; [name], MD

## 2016-10-20 NOTE — Care Management Important Message (Signed)
Important Message  Patient Details  Name: Brandon Maynard. MRN: [id number] Date of Birth: 08-06-1956   Medicare Important Message Given:  Yes    Beverly Sessions, RN 10/20/2016, 12:09 PM

## 2016-10-20 NOTE — Progress Notes (Signed)
Nutrition Follow-up  DOCUMENTATION CODES:   Severe malnutrition in context of chronic illness  INTERVENTION:  -Plan to discontinue Nepro as pt is not drinking.  -Will add Mighty Shakes, Magic Cup instead as nutritional supplement  NUTRITION DIAGNOSIS:   Malnutrition (Severe) related to chronic illness (kidney disease) as evidenced by severe depletion of body fat, severe depletion of muscle mass.  Improving  GOAL:   Patient will meet greater than or equal to 90% of their needs  MONITOR:   PO intake, Supplement acceptance, Skin, I & O's  REASON FOR ASSESSMENT:   Consult  (pt requesting fluids at night)  ASSESSMENT:   60 y.o. male with a PMHx of anemia, bipolar disorder, degenerative disc disease, GERD, hyperlipidemia, hypertension, paranoid schizophrenia, who was admitted to [facility] on 10/04/2016 for evaluation of left leg swelling and redness.   Last dialysis on 11/15, pt may not require outpatient dialysis at discharge at this point. UOP remains good Recorded po intake 78% of meals on average, pt has been refusing Nepro Labs: sodium 133, potassium wdl, phosphorus 4.9  Meds: reviewed  Filed Weights   10/13/16 1350 10/13/16 1705 10/14/16 1504  Weight: 202 lb 2.6 oz (91.7 kg) 202 lb 2.6 oz (91.7 kg) 202 lb (91.6 kg)    Diet Order:  Diet 2 gram sodium Room service appropriate? Yes; Fluid consistency: Thin  Skin:  Wound (see comment) (leg cellulitis)  Last BM:  11/15  Height:   Ht Readings from Last 1 Encounters:  10/14/16 6\' 3"  (1.905 m)    Weight:   Wt Readings from Last 1 Encounters:  10/14/16 202 lb (91.6 kg)   Filed Weights   10/13/16 1350 10/13/16 1705 10/14/16 1504  Weight: 202 lb 2.6 oz (91.7 kg) 202 lb 2.6 oz (91.7 kg) 202 lb (91.6 kg)    Ideal Body Weight:  89 kg  BMI:  Body mass index is 25.25 kg/m.  Estimated Nutritional Needs:   Kcal:  2100-2300  Protein:  90-100 grams  Fluid:  1.2 L/day  EDUCATION NEEDS:   Education needs  addressed  Kerman Passey [place], [place], LDN [phone] Pager  [phone] Weekend/On-Call Pager

## 2016-10-20 NOTE — Clinical Social Work Note (Signed)
Informed by nephrology that patient will not require outpatient dialysis at this time. PT assessed yesterday and changed their recommendation to SNF however, CSW asked nursing staff to ambulate patient today and patient was able to ambulate with a walker with contact guard assist. Patient was able to bathe himself and then return to the bed from the bathroom. CSW hopeful patient will be able to return to his family care home Friday. Shela Leff MSW,LCSW [phone]

## 2016-10-20 NOTE — Progress Notes (Signed)
[facility]  ROUNDING NOTE   Subjective:   S Cr still critically high but slight improvement today to 4.68->4.44.   BUN high again to 81. No shortness of breath No edema good urine output However,  Albumin severely low at 1.5; Hgb 7.2  Objective:  Vital signs in last 24 hours:  Temp:  [97.5 F (36.4 C)-99.6 F (37.6 C)] 97.5 F (36.4 C) (11/22 0453) Pulse Rate:  [77-97] 77 (11/22 0453) Resp:  [16-20] 20 (11/22 0453) BP: (97-113)/(50-65) 97/50 (11/22 0453) SpO2:  [97 %-100 %] 97 % (11/22 0453)  Weight change:  Filed Weights   10/13/16 1350 10/13/16 1705 10/14/16 1504  Weight: 91.7 kg (202 lb 2.6 oz) 91.7 kg (202 lb 2.6 oz) 91.6 kg (202 lb)    Intake/Output: I/O last 3 completed shifts: In: 6222 [P.O.:960; I.V.:21; Blood:674; IV Piggyback:50] Out: 4000 [Urine:4000]   Intake/Output this shift:  Total I/O In: 240 [P.O.:240] Out: 500 [Urine:500]  Physical Exam: General: Laying in bed  Head: Normocephalic, atraumatic. Moist oral mucosal membranes  Eyes: Anicteric  Neck: Supple, trachea midline  Lungs:  Clear to auscultation, normal effort  Heart: S1S2 no rubs  Abdomen:  Soft, nontender, BS present  Extremities: trace edema left lower extremity, +erythema and clean dressings.   Neurologic: Awake, follows commands  Skin: Left foot induration  Access:  Right IJ permcath 11/16 Dr. Lucky Cowboy,    Basic Metabolic Panel:  Recent Labs Lab 10/15/16 0534 10/16/16 0704 10/17/16 0549 10/18/16 0955 10/19/16 0630 10/20/16 0450  NA 140 137 137 136 134* 133*  K 4.8 4.9 4.8 4.7 4.7 4.4  CL 110 110 111 109 110 108  CO2 24 19* 20* 19* 18* 19*  GLUCOSE 94 97 121* 148* 117* 108*  BUN 65* 67* 75* 80* 75* 81*  CREATININE 4.13* 4.39* 4.61* 4.71* 4.68* 4.44*  CALCIUM 7.8* 7.7* 7.8* 8.1* 8.3* 8.1*  PHOS 5.6* 5.3* 4.2 4.9*  --   --     Liver Function Tests:  Recent Labs Lab 10/15/16 0534 10/16/16 0704 10/17/16 0549 10/18/16 0955  ALBUMIN 1.4* 1.3* 1.5* 1.5*    No results for input(s): LIPASE, AMYLASE in the last 168 hours. No results for input(s): AMMONIA in the last 168 hours.  CBC:  Recent Labs Lab 10/13/16 1400 10/14/16 0604  10/15/16 0534 10/19/16 0630 10/19/16 1451 10/19/16 2315 10/20/16 0450  WBC 13.1* 10.9*  --  12.1* 7.8  --   --  7.1  HGB 7.6* 6.3*  < > 7.6* 6.3* 6.7* 6.8* 7.2*  HCT 23.7* 18.7*  < > 23.0* 18.6* 19.8* 19.9* 20.5*  MCV 92.9 91.6  --  92.5 88.7  --   --  87.8  PLT 136* 110*  --  123* 154  --   --  151  < > = values in this interval not displayed.  Cardiac Enzymes: No results for input(s): CKTOTAL, CKMB, CKMBINDEX, TROPONINI in the last 168 hours.  BNP: Invalid input(s): POCBNP  CBG: No results for input(s): GLUCAP in the last 168 hours.  Microbiology: Results for orders placed or performed during the hospital encounter of 10/04/16  Blood Culture (routine x 2)     Status: Abnormal   Collection Time: 10/04/16  3:51 PM  Result Value Ref Range Status   Specimen Description BLOOD RT [facility]  Final   Special Requests BOTTLES DRAWN AEROBIC AND ANAEROBIC 10CC  Final   Culture  Setup Time   Final    Organism ID to follow [place]  IN BOTH AEROBIC AND ANAEROBIC BOTTLES CRITICAL RESULT CALLED TO, READ BACK BY AND VERIFIED WITH: NATE COOKSON ON 10/05/16 AT 0448 BY TLB CONFIRMED BY TLB    Culture ESCHERICHIA COLI (A)  Final   Report Status 10/07/2016 FINAL  Final   Organism ID, Bacteria ESCHERICHIA COLI  Final      Susceptibility   Escherichia coli - MIC*    AMPICILLIN >=32 RESISTANT Resistant     CEFAZOLIN <=4 SENSITIVE Sensitive     CEFEPIME <=1 SENSITIVE Sensitive     CEFTAZIDIME <=1 SENSITIVE Sensitive     CEFTRIAXONE <=1 SENSITIVE Sensitive     CIPROFLOXACIN <=0.25 SENSITIVE Sensitive     GENTAMICIN <=1 SENSITIVE Sensitive     IMIPENEM <=0.25 SENSITIVE Sensitive     TRIMETH/SULFA >=320 RESISTANT Resistant     AMPICILLIN/SULBACTAM 16 INTERMEDIATE Intermediate     PIP/TAZO <=4 SENSITIVE  Sensitive     Extended ESBL NEGATIVE Sensitive     * ESCHERICHIA COLI  Blood Culture ID Panel (Reflexed)     Status: Abnormal   Collection Time: 10/04/16  3:51 PM  Result Value Ref Range Status   Enterococcus species NOT DETECTED NOT DETECTED Final   Listeria monocytogenes NOT DETECTED NOT DETECTED Final   Staphylococcus species NOT DETECTED NOT DETECTED Final   Staphylococcus aureus NOT DETECTED NOT DETECTED Final   Streptococcus species NOT DETECTED NOT DETECTED Final   Streptococcus agalactiae NOT DETECTED NOT DETECTED Final   Streptococcus pneumoniae NOT DETECTED NOT DETECTED Final   Streptococcus pyogenes NOT DETECTED NOT DETECTED Final   Acinetobacter baumannii NOT DETECTED NOT DETECTED Final   Enterobacteriaceae species DETECTED (A) NOT DETECTED Final    Comment: CRITICAL RESULT CALLED TO, READ BACK BY AND VERIFIED WITH: NATE COOKSON ON 10/05/16 AT 0448 BY TLB    Enterobacter cloacae complex NOT DETECTED NOT DETECTED Final   Escherichia coli DETECTED (A) NOT DETECTED Final    Comment: CRITICAL RESULT CALLED TO, READ BACK BY AND VERIFIED WITH: NATE COOKSON ON 10/05/16 AT 0448 BY TLB                                                                                                Klebsiella oxytoca NOT DETECTED NOT DETECTED Final   Klebsiella pneumoniae NOT DETECTED NOT DETECTED Final   Proteus species NOT DETECTED NOT DETECTED Final   Serratia marcescens NOT DETECTED NOT DETECTED Final   Carbapenem resistance NOT DETECTED NOT DETECTED Final   Haemophilus influenzae NOT DETECTED NOT DETECTED Final   Neisseria meningitidis NOT DETECTED NOT DETECTED Final   Pseudomonas aeruginosa NOT DETECTED NOT DETECTED Final   Candida albicans NOT DETECTED NOT DETECTED Final   Candida glabrata NOT DETECTED NOT DETECTED Final   Candida krusei NOT DETECTED NOT DETECTED Final   Candida parapsilosis NOT DETECTED NOT DETECTED Final   Candida tropicalis NOT DETECTED NOT DETECTED Final  Blood Culture  (routine x 2)     Status: Abnormal   Collection Time: 10/04/16  3:56 PM  Result Value Ref Range Status   Specimen Description BLOOD LT [facility]  Final   Special Requests BOTTLES DRAWN  AEROBIC AND ANAEROBIC 10CC  Final   Culture  Setup Time   Final    GRAM NEGATIVE RODS IN BOTH AEROBIC AND ANAEROBIC BOTTLES CRITICAL RESULT CALLED TO, READ BACK BY AND VERIFIED WITH: NATE COOKSON ON 10/05/16 AT 0448 BY TLB CONFIRMED BY TLB    Culture ESCHERICHIA COLI (A)  Final   Report Status 10/07/2016 FINAL  Final  Urine culture     Status: Abnormal   Collection Time: 10/04/16  9:01 PM  Result Value Ref Range Status   Specimen Description URINE, RANDOM  Final   Special Requests NONE  Final   Culture 30,000 COLONIES/mL ESCHERICHIA COLI (A)  Final   Report Status 10/07/2016 FINAL  Final   Organism ID, Bacteria ESCHERICHIA COLI (A)  Final      Susceptibility   Escherichia coli - MIC*    AMPICILLIN >=32 RESISTANT Resistant     CEFAZOLIN <=4 SENSITIVE Sensitive     CEFTRIAXONE <=1 SENSITIVE Sensitive     CIPROFLOXACIN <=0.25 SENSITIVE Sensitive     GENTAMICIN <=1 SENSITIVE Sensitive     IMIPENEM <=0.25 SENSITIVE Sensitive     NITROFURANTOIN <=16 SENSITIVE Sensitive     TRIMETH/SULFA >=320 RESISTANT Resistant     AMPICILLIN/SULBACTAM 16 INTERMEDIATE Intermediate     PIP/TAZO <=4 SENSITIVE Sensitive     Extended ESBL NEGATIVE Sensitive     * 30,000 COLONIES/mL ESCHERICHIA COLI  MRSA PCR Screening     Status: None   Collection Time: 10/04/16  9:01 PM  Result Value Ref Range Status   MRSA by PCR NEGATIVE NEGATIVE Final    Comment:        The GeneXpert MRSA Assay (FDA approved for NASAL specimens only), is one component of a comprehensive MRSA colonization surveillance program. It is not intended to diagnose MRSA infection nor to guide or monitor treatment for MRSA infections.   MRSA PCR Screening     Status: None   Collection Time: 10/06/16 11:59 AM  Result Value Ref Range Status   MRSA by PCR  NEGATIVE NEGATIVE Final    Comment:        The GeneXpert MRSA Assay (FDA approved for NASAL specimens only), is one component of a comprehensive MRSA colonization surveillance program. It is not intended to diagnose MRSA infection nor to guide or monitor treatment for MRSA infections.   Aerobic Culture (superficial specimen)     Status: None   Collection Time: 10/11/16  4:03 PM  Result Value Ref Range Status   Specimen Description LEG  Final   Special Requests Normal  Final   Gram Stain NO WBC SEEN NO ORGANISMS SEEN   Final   Culture   Final    NO GROWTH 3 DAYS Performed at [facility]    Report Status 10/14/2016 FINAL  Final  CULTURE, BLOOD (ROUTINE X 2) w Reflex to ID Panel     Status: None   Collection Time: 10/13/16  9:33 AM  Result Value Ref Range Status   Specimen Description BLOOD RIGHT HAND  Final   Special Requests   Final    BOTTLES DRAWN AEROBIC AND ANAEROBIC [place]   Culture NO GROWTH 5 DAYS  Final   Report Status 10/18/2016 FINAL  Final  CULTURE, BLOOD (ROUTINE X 2) w Reflex to ID Panel     Status: None   Collection Time: 10/13/16  9:40 AM  Result Value Ref Range Status   Specimen Description BLOOD LEFT ASSIST CONTROL  Final   Special Requests  Final    BOTTLES DRAWN AEROBIC AND ANAEROBIC [place]   Culture NO GROWTH 5 DAYS  Final   Report Status 10/18/2016 FINAL  Final    Coagulation Studies: No results for input(s): LABPROT, INR in the last 72 hours.  Urinalysis: No results for input(s): COLORURINE, LABSPEC, PHURINE, GLUCOSEU, HGBUR, BILIRUBINUR, KETONESUR, PROTEINUR, UROBILINOGEN, NITRITE, LEUKOCYTESUR in the last 72 hours.  Invalid input(s): APPERANCEUR    Imaging: No results found.   Medications:    . asenapine  10 mg Sublingual BID  . brimonidine  1 drop Both Eyes Q12H   And  . timolol  1 drop Both Eyes Q12H  .  ceFAZolin (ANCEF) IV  1 g Intravenous q1800  . docusate sodium  100 mg Oral BID  . feeding  supplement (NEPRO CARB STEADY)  237 mL Oral BID BM  . FLUoxetine  40 mg Oral Daily  . latanoprost  1 drop Both Eyes QHS  . levothyroxine  50 mcg Oral QAC breakfast  . multivitamin with minerals  1 tablet Oral Daily  . polyethylene glycol  17 g Oral Daily  . pyridOXINE  100 mg Oral BID  . senna  1 tablet Oral Daily  . silver sulfADIAZINE   Topical BID  . simvastatin  20 mg Oral QHS  . traZODone  100 mg Oral QHS   sodium chloride, sodium chloride, acetaminophen, alteplase, alum & mag hydroxide-simeth, haloperidol lactate, heparin, lidocaine (PF), lidocaine-prilocaine, ondansetron **OR** ondansetron (ZOFRAN) IV, oxyCODONE, pentafluoroprop-tetrafluoroeth  Assessment/ Plan:  60 y.o. white male with past medication history of anemia, bipolar disorder, degenerative disc disease, GERD, hyperlipidemia, hypertension, paranoid schizophrenia, who was admitted to [facility] on 10/04/2016 for evaluation of left leg swelling and redness.   1.  Acute renal failure on chronic kidney disease stage III: baseline creatinine of 2.1 EGFR 32 with proteinuria Acute renal failure secondary to sepsis, hypotension and ATN; Chronic kidney disease secondary to hypertension  Required renal replacement. dialysis treatments 11/11 and 11/12. And Last Hemodialysis 11/15 due to hyperkalemia, and metabolic acidosis Nonoliguric urine output.  S Cr improved to 4.44 without dialysis - Monitor daily for dialysis need.  - serum creatinine is starting to turn around.  Continue to monitor 1-2 days.  If trend continues, he may be able to be discharged without dialysis.  2.    Anemia and thrombocytopenia: hemoglobin low. Platelets improved off valproic acid Hematology consulted on 11/9 PRBC transfusion on 11/16, 11/21 Hemoglobin  7.2 today Give EPO SQ   3.  Left lower extremity cellulitis: with sepsis. E coli from blood cultures 11/6  Leukocytosis improving. Afebrile     4.  Disposition To be determined based on continued need  for Hemodialysis - Renal function is expected to improve - UOP is good - No acute indication for HD at present, will continue to monitor   LOS: 16 [place] 11/22/201712:24 PM

## 2016-10-21 LAB — RENAL FUNCTION PANEL
ALBUMIN: 1.5 g/dL — AB (ref 3.5–5.0)
ANION GAP: 6 (ref 5–15)
BUN: 73 mg/dL — ABNORMAL HIGH (ref 6–20)
CALCIUM: 8.3 mg/dL — AB (ref 8.9–10.3)
CO2: 19 mmol/L — ABNORMAL LOW (ref 22–32)
Chloride: 110 mmol/L (ref 101–111)
Creatinine, Ser: 3.96 mg/dL — ABNORMAL HIGH (ref 0.61–1.24)
GFR calc Af Amer: 17 mL/min — ABNORMAL LOW (ref >60)
GFR, EST NON AFRICAN AMERICAN: 15 mL/min — AB (ref >60)
Glucose, Bld: 83 mg/dL (ref 65–99)
PHOSPHORUS: 4.9 mg/dL — AB (ref 2.5–4.6)
POTASSIUM: 4.4 mmol/L (ref 3.5–5.1)
SODIUM: 135 mmol/L (ref 135–145)

## 2016-10-21 LAB — TRANSFERRIN: Transferrin: 145 mg/dL — ABNORMAL LOW (ref 180–329)

## 2016-10-21 LAB — IRON AND TIBC
IRON: 19 ug/dL — AB (ref 45–182)
SATURATION RATIOS: 10 % — AB (ref 17.9–39.5)
TIBC: 197 ug/dL — ABNORMAL LOW (ref 250–450)
UIBC: 178 ug/dL

## 2016-10-21 LAB — FERRITIN: Ferritin: 359 ng/mL — ABNORMAL HIGH (ref 24–336)

## 2016-10-21 MED ORDER — FERROUS GLUCONATE 324 (38 FE) MG PO TABS
324.0000 mg | ORAL_TABLET | Freq: Two times a day (BID) | ORAL | Status: DC
  Administered 2016-10-21 – 2016-10-22 (×2): 324 mg via ORAL
  Filled 2016-10-21 (×3): qty 1

## 2016-10-21 MED ORDER — CEFAZOLIN IN D5W 1 GM/50ML IV SOLN
1.0000 g | Freq: Two times a day (BID) | INTRAVENOUS | Status: DC
  Administered 2016-10-21 – 2016-10-22 (×3): 1 g via INTRAVENOUS
  Filled 2016-10-21 (×5): qty 50

## 2016-10-21 NOTE — Progress Notes (Signed)
Pt requested coke and ice chips. Primary nurse took coke and Ice to pt room and began to pour coke into cup of Ice. Pt then began to yell at the top of his Lungs "No don't do that" Pt threw the coke on the Floor. Pt continued screaming " This is my day" Primary nurse left the room;  notified charge nurse and called code 300. Supervisor along with Security came to assess situation. Nurse Supervisor spoke to pt at length. Primary nurse to continue to monitor pt.

## 2016-10-21 NOTE — Progress Notes (Signed)
[facility]  ROUNDING NOTE   Subjective:   S Cr still critically high but slight improvement today to 4.68->4.44->3.96.   BUN lower today to 73. No shortness of breath No edema good urine output However,  Albumin severely low at 1.5; Hgb 7.2  Objective:  Vital signs in last 24 hours:  Temp:  [98 F (36.7 C)-98.9 F (37.2 C)] 98.7 F (37.1 C) (11/23 0616) Pulse Rate:  [75-99] 75 (11/23 0616) Resp:  [16-22] 22 (11/23 0616) BP: (106-130)/(60-68) 106/68 (11/23 0616) SpO2:  [98 %-100 %] 99 % (11/23 0616)  Weight change:  Filed Weights   10/13/16 1350 10/13/16 1705 10/14/16 1504  Weight: 91.7 kg (202 lb 2.6 oz) 91.7 kg (202 lb 2.6 oz) 91.6 kg (202 lb)    Intake/Output: I/O last 3 completed shifts: In: 1955 [P.O.:1260; I.V.:21; Blood:674] Out: 3750 [Urine:3750]   Intake/Output this shift:  Total I/O In: -  Out: 950 [Urine:950]  Physical Exam: General: Laying in bed  Head: Normocephalic, atraumatic. Moist oral mucosal membranes  Eyes: Anicteric  Neck: Supple, trachea midline  Lungs:  Clear to auscultation, normal effort  Heart: S1S2 no rubs  Abdomen:  Soft, nontender, BS present  Extremities: trace edema left lower extremity, +erythema and clean dressings.   Neurologic: Awake, follows commands  Skin: Left foot induration  Access:  Right IJ permcath 11/16 Dr. Lucky Cowboy,    Basic Metabolic Panel:  Recent Labs Lab 10/15/16 0534 10/16/16 0704 10/17/16 0549 10/18/16 0955 10/19/16 0630 10/20/16 0450 10/21/16 0654  NA 140 137 137 136 134* 133* 135  K 4.8 4.9 4.8 4.7 4.7 4.4 4.4  CL 110 110 111 109 110 108 110  CO2 24 19* 20* 19* 18* 19* 19*  GLUCOSE 94 97 121* 148* 117* 108* 83  BUN 65* 67* 75* 80* 75* 81* 73*  CREATININE 4.13* 4.39* 4.61* 4.71* 4.68* 4.44* 3.96*  CALCIUM 7.8* 7.7* 7.8* 8.1* 8.3* 8.1* 8.3*  PHOS 5.6* 5.3* 4.2 4.9*  --   --  4.9*    Liver Function Tests:  Recent Labs Lab 10/15/16 0534 10/16/16 0704 10/17/16 0549 10/18/16 0955  10/21/16 0654  ALBUMIN 1.4* 1.3* 1.5* 1.5* 1.5*   No results for input(s): LIPASE, AMYLASE in the last 168 hours. No results for input(s): AMMONIA in the last 168 hours.  CBC:  Recent Labs Lab 10/15/16 0534 10/19/16 0630 10/19/16 1451 10/19/16 2315 10/20/16 0450  WBC 12.1* 7.8  --   --  7.1  HGB 7.6* 6.3* 6.7* 6.8* 7.2*  HCT 23.0* 18.6* 19.8* 19.9* 20.5*  MCV 92.5 88.7  --   --  87.8  PLT 123* 154  --   --  151    Cardiac Enzymes: No results for input(s): CKTOTAL, CKMB, CKMBINDEX, TROPONINI in the last 168 hours.  BNP: Invalid input(s): POCBNP  CBG: No results for input(s): GLUCAP in the last 168 hours.  Microbiology: Results for orders placed or performed during the hospital encounter of 10/04/16  Blood Culture (routine x 2)     Status: Abnormal   Collection Time: 10/04/16  3:51 PM  Result Value Ref Range Status   Specimen Description BLOOD RT [facility]  Final   Special Requests BOTTLES DRAWN AEROBIC AND ANAEROBIC 10CC  Final   Culture  Setup Time   Final    Organism ID to follow GRAM NEGATIVE RODS IN BOTH AEROBIC AND ANAEROBIC BOTTLES CRITICAL RESULT CALLED TO, READ BACK BY AND VERIFIED WITH: NATE COOKSON ON 10/05/16 AT 0448 BY TLB CONFIRMED BY  TLB    Culture ESCHERICHIA COLI (A)  Final   Report Status 10/07/2016 FINAL  Final   Organism ID, Bacteria ESCHERICHIA COLI  Final      Susceptibility   Escherichia coli - MIC*    AMPICILLIN >=32 RESISTANT Resistant     CEFAZOLIN <=4 SENSITIVE Sensitive     CEFEPIME <=1 SENSITIVE Sensitive     CEFTAZIDIME <=1 SENSITIVE Sensitive     CEFTRIAXONE <=1 SENSITIVE Sensitive     CIPROFLOXACIN <=0.25 SENSITIVE Sensitive     GENTAMICIN <=1 SENSITIVE Sensitive     IMIPENEM <=0.25 SENSITIVE Sensitive     TRIMETH/SULFA >=320 RESISTANT Resistant     AMPICILLIN/SULBACTAM 16 INTERMEDIATE Intermediate     PIP/TAZO <=4 SENSITIVE Sensitive     Extended ESBL NEGATIVE Sensitive     * ESCHERICHIA COLI  Blood Culture ID Panel (Reflexed)      Status: Abnormal   Collection Time: 10/04/16  3:51 PM  Result Value Ref Range Status   Enterococcus species NOT DETECTED NOT DETECTED Final   Listeria monocytogenes NOT DETECTED NOT DETECTED Final   Staphylococcus species NOT DETECTED NOT DETECTED Final   Staphylococcus aureus NOT DETECTED NOT DETECTED Final   Streptococcus species NOT DETECTED NOT DETECTED Final   Streptococcus agalactiae NOT DETECTED NOT DETECTED Final   Streptococcus pneumoniae NOT DETECTED NOT DETECTED Final   Streptococcus pyogenes NOT DETECTED NOT DETECTED Final   Acinetobacter baumannii NOT DETECTED NOT DETECTED Final   Enterobacteriaceae species DETECTED (A) NOT DETECTED Final    Comment: CRITICAL RESULT CALLED TO, READ BACK BY AND VERIFIED WITH: NATE COOKSON ON 10/05/16 AT 0448 BY TLB    Enterobacter cloacae complex NOT DETECTED NOT DETECTED Final   Escherichia coli DETECTED (A) NOT DETECTED Final    Comment: CRITICAL RESULT CALLED TO, READ BACK BY AND VERIFIED WITH: NATE COOKSON ON 10/05/16 AT 0448 BY TLB                                                                                                Klebsiella oxytoca NOT DETECTED NOT DETECTED Final   Klebsiella pneumoniae NOT DETECTED NOT DETECTED Final   Proteus species NOT DETECTED NOT DETECTED Final   Serratia marcescens NOT DETECTED NOT DETECTED Final   Carbapenem resistance NOT DETECTED NOT DETECTED Final   Haemophilus influenzae NOT DETECTED NOT DETECTED Final   Neisseria meningitidis NOT DETECTED NOT DETECTED Final   Pseudomonas aeruginosa NOT DETECTED NOT DETECTED Final   Candida albicans NOT DETECTED NOT DETECTED Final   Candida glabrata NOT DETECTED NOT DETECTED Final   Candida krusei NOT DETECTED NOT DETECTED Final   Candida parapsilosis NOT DETECTED NOT DETECTED Final   Candida tropicalis NOT DETECTED NOT DETECTED Final  Blood Culture (routine x 2)     Status: Abnormal   Collection Time: 10/04/16  3:56 PM  Result Value Ref Range Status    Specimen Description BLOOD LT [facility]  Final   Special Requests BOTTLES DRAWN AEROBIC AND ANAEROBIC 10CC  Final   Culture  Setup Time   Final    GRAM NEGATIVE RODS IN BOTH AEROBIC AND ANAEROBIC  BOTTLES CRITICAL RESULT CALLED TO, READ BACK BY AND VERIFIED WITH: NATE COOKSON ON 10/05/16 AT 0448 BY TLB CONFIRMED BY TLB    Culture ESCHERICHIA COLI (A)  Final   Report Status 10/07/2016 FINAL  Final  Urine culture     Status: Abnormal   Collection Time: 10/04/16  9:01 PM  Result Value Ref Range Status   Specimen Description URINE, RANDOM  Final   Special Requests NONE  Final   Culture 30,000 COLONIES/mL ESCHERICHIA COLI (A)  Final   Report Status 10/07/2016 FINAL  Final   Organism ID, Bacteria ESCHERICHIA COLI (A)  Final      Susceptibility   Escherichia coli - MIC*    AMPICILLIN >=32 RESISTANT Resistant     CEFAZOLIN <=4 SENSITIVE Sensitive     CEFTRIAXONE <=1 SENSITIVE Sensitive     CIPROFLOXACIN <=0.25 SENSITIVE Sensitive     GENTAMICIN <=1 SENSITIVE Sensitive     IMIPENEM <=0.25 SENSITIVE Sensitive     NITROFURANTOIN <=16 SENSITIVE Sensitive     TRIMETH/SULFA >=320 RESISTANT Resistant     AMPICILLIN/SULBACTAM 16 INTERMEDIATE Intermediate     PIP/TAZO <=4 SENSITIVE Sensitive     Extended ESBL NEGATIVE Sensitive     * 30,000 COLONIES/mL ESCHERICHIA COLI  MRSA PCR Screening     Status: None   Collection Time: 10/04/16  9:01 PM  Result Value Ref Range Status   MRSA by PCR NEGATIVE NEGATIVE Final    Comment:        The GeneXpert MRSA Assay (FDA approved for NASAL specimens only), is one component of a comprehensive MRSA colonization surveillance program. It is not intended to diagnose MRSA infection nor to guide or monitor treatment for MRSA infections.   MRSA PCR Screening     Status: None   Collection Time: 10/06/16 11:59 AM  Result Value Ref Range Status   MRSA by PCR NEGATIVE NEGATIVE Final    Comment:        The GeneXpert MRSA Assay (FDA approved for NASAL  specimens only), is one component of a comprehensive MRSA colonization surveillance program. It is not intended to diagnose MRSA infection nor to guide or monitor treatment for MRSA infections.   Aerobic Culture (superficial specimen)     Status: None   Collection Time: 10/11/16  4:03 PM  Result Value Ref Range Status   Specimen Description LEG  Final   Special Requests Normal  Final   Gram Stain NO WBC SEEN NO ORGANISMS SEEN   Final   Culture   Final    NO GROWTH 3 DAYS Performed at [facility]    Report Status 10/14/2016 FINAL  Final  CULTURE, BLOOD (ROUTINE X 2) w Reflex to ID Panel     Status: None   Collection Time: 10/13/16  9:33 AM  Result Value Ref Range Status   Specimen Description BLOOD RIGHT HAND  Final   Special Requests   Final    BOTTLES DRAWN AEROBIC AND ANAEROBIC [place]   Culture NO GROWTH 5 DAYS  Final   Report Status 10/18/2016 FINAL  Final  CULTURE, BLOOD (ROUTINE X 2) w Reflex to ID Panel     Status: None   Collection Time: 10/13/16  9:40 AM  Result Value Ref Range Status   Specimen Description BLOOD LEFT ASSIST CONTROL  Final   Special Requests   Final    BOTTLES DRAWN AEROBIC AND ANAEROBIC [place]   Culture NO GROWTH 5 DAYS  Final   Report Status 10/18/2016 FINAL  Final    Coagulation Studies: No results for input(s): LABPROT, INR in the last 72 hours.  Urinalysis: No results for input(s): COLORURINE, LABSPEC, PHURINE, GLUCOSEU, HGBUR, BILIRUBINUR, KETONESUR, PROTEINUR, UROBILINOGEN, NITRITE, LEUKOCYTESUR in the last 72 hours.  Invalid input(s): APPERANCEUR    Imaging: No results found.   Medications:    . asenapine  10 mg Sublingual BID  . brimonidine  1 drop Both Eyes Q12H   And  . timolol  1 drop Both Eyes Q12H  .  ceFAZolin (ANCEF) IV  1 g Intravenous q1800  . docusate sodium  100 mg Oral BID  . FLUoxetine  40 mg Oral Daily  . latanoprost  1 drop Both Eyes QHS  . levothyroxine  50 mcg Oral QAC  breakfast  . multivitamin with minerals  1 tablet Oral Daily  . polyethylene glycol  17 g Oral Daily  . pyridOXINE  100 mg Oral BID  . senna  1 tablet Oral Daily  . silver sulfADIAZINE   Topical BID  . simvastatin  20 mg Oral QHS  . traZODone  100 mg Oral QHS   sodium chloride, sodium chloride, acetaminophen, alteplase, alum & mag hydroxide-simeth, haloperidol lactate, heparin, lidocaine (PF), lidocaine-prilocaine, ondansetron **OR** ondansetron (ZOFRAN) IV, oxyCODONE, pentafluoroprop-tetrafluoroeth  Assessment/ Plan:  60 y.o. white male with past medication history of anemia, bipolar disorder, degenerative disc disease, GERD, hyperlipidemia, hypertension, paranoid schizophrenia, who was admitted to [facility] on 10/04/2016 for evaluation of left leg swelling and redness.   1.  Acute renal failure on chronic kidney disease stage III: baseline creatinine of 2.1 EGFR 32 with proteinuria Acute renal failure secondary to sepsis, hypotension and ATN; Chronic kidney disease secondary to hypertension  Required renal replacement. dialysis treatments 11/11 and 11/12. And Last Hemodialysis 11/15 due to hyperkalemia, and metabolic acidosis UOP 1610  S Cr improved to 3.96 without dialysis - Monitor daily for dialysis need.  - serum creatinine is starting to turn around.   - If serum creatinine is lower yesterday, may be able to remove dialysis cathter  2.  Iron deficency Anemia   Hematology consulted on 11/9 PRBC transfusion on 11/16, 11/21 Hemoglobin  7.2  Iron deficiency noted. Start oral iron. Avoid iv iron while he has active infection   3.  Left lower extremity cellulitis: with sepsis. E coli from blood cultures 11/6  Leukocytosis improving. Afebrile        LOS: 17 , 11/23/20179:51 AM

## 2016-10-21 NOTE — Consult Note (Signed)
Pharmacy Antibiotic Note  Brandon Maynard. is a 60 y.o. male admitted on 10/04/2016 with bacteremia, cellulitis and UTI.  Patient has ecoli bacteremia and UTI.  Per ID note continue Cefazolin for 14 days then transition to keflex for one week. Today is day 11 of IV antibiotics.   Plan: Will change dose to cefazolin 1 g IV q12h.   Patient has HD catheter and was receiving intermittent hemodialysis as needed by nephrology. Last HD session was 11/15 and catheter may be removed soon. Will dose based on CrCl.    Height: 6\' 3"  (190.5 cm) Weight: 202 lb (91.6 kg) IBW/kg (Calculated) : 84.5  Temp (24hrs), Avg:98.5 F (36.9 C), Min:98 F (36.7 C), Max:98.9 F (37.2 C)   Recent Labs Lab 10/15/16 0534  10/17/16 0549 10/18/16 0955 10/19/16 0630 10/20/16 0450 10/21/16 0654  WBC 12.1*  --   --   --  7.8 7.1  --   CREATININE 4.13*  < > 4.61* 4.71* 4.68* 4.44* 3.96*  < > = values in this interval not displayed.  Estimated Creatinine Clearance: 23.1 mL/min (by C-G formula based on SCr of 3.96 mg/dL (H)).    Allergies  Allergen Reactions  . Tetracyclines & Related Hives    Antimicrobials this admission: vancomycin 11/6 >> 11/6, 11/12>> 11/13 zosyn 11/6 >> 11/6 Meropenem 11/7>> 11/9, 11/12>>11/13 CTX 11/9 >> 11/10 Keflex 11/10>>11/12 Cefazolin 11/13 >>  Dose adjustments this admission:   Microbiology results: 11/15 BCx x2 sent 11/13 WCx: NGTD 11/6 BCx: ecoli x2 11/6 UCx: 30,000 EColi 11/6 MRSA PCR: neg  Thank you for allowing pharmacy to be a part of this patient's care.  Lenis Noon, PharmD Clinical Pharmacist 10/21/2016 10:05 AM

## 2016-10-21 NOTE — Progress Notes (Signed)
[place] at [place] NAME: Carmyne Rudy    MRN#:  [id number]  DATE OF BIRTH:  January 14, 1956  SUBJECTIVE:  Hospital Day: 17 days Brandon Maynard is a 60 y.o. male presenting with Foot Pain .   Overnight events: No acute overnight events Interval Events: Sleeping but arousable no medical complaints  REVIEW OF SYSTEMS:  CONSTITUTIONAL: No fever, fatigue or weakness.  EYES: No blurred or double vision.  EARS, NOSE, AND THROAT: No tinnitus or ear pain.  RESPIRATORY: No cough, shortness of breath, wheezing or hemoptysis.  CARDIOVASCULAR: No chest pain, orthopnea, edema.  GASTROINTESTINAL: No nausea, vomiting, diarrhea or abdominal pain.  GENITOURINARY: No dysuria, hematuria.  ENDOCRINE: No polyuria, nocturia,  HEMATOLOGY: No anemia, easy bruising or bleeding SKIN: No rash or lesion. MUSCULOSKELETAL: No joint pain or arthritis.   NEUROLOGIC: No tingling, numbness, weakness.  PSYCHIATRY: No anxiety or depression.   DRUG ALLERGIES:   Allergies  Allergen Reactions  . Tetracyclines & Related Hives    VITALS:  Blood pressure 106/68, pulse 75, temperature 98.7 F (37.1 C), temperature source Oral, resp. rate (!) 22, height 6\' 3"  (1.905 m), weight 91.6 kg (202 lb), SpO2 99 %.  PHYSICAL EXAMINATION:  VITAL SIGNS: Vitals:   10/20/16 2124 10/21/16 0616  BP: 113/63 106/68  Pulse: 99 75  Resp: 18 (!) 22  Temp: 98.9 F (37.2 C) 98.7 F (37.1 C)   GENERAL:60 y.o.male currently in no acute distress.  HEAD: Normocephalic, atraumatic.  EYES: Pupils equal, round, reactive to light. Extraocular muscles intact. No scleral icterus.  MOUTH: Moist mucosal membrane. Dentition intact. No abscess noted.  EAR, NOSE, THROAT: Clear without exudates. No external lesions.  NECK: Supple. No thyromegaly. No nodules. No JVD.  PULMONARY: Clear to ascultation, without wheeze rails or rhonci. No use of accessory muscles, Good respiratory effort. good air entry  bilaterally CHEST: Nontender to palpation.  CARDIOVASCULAR: S1 and S2. Regular rate and rhythm. No murmurs, rubs, or gallops. No edema. Pedal pulses 2+ bilaterally.  GASTROINTESTINAL: Soft, nontender, nondistended. No masses. Positive bowel sounds. No hepatosplenomegaly.  MUSCULOSKELETAL: No swelling, clubbing, or edema. Range of motion full in all extremities.  NEUROLOGIC: Cranial nerves II through XII are intact. No gross focal neurological deficits. Sensation intact. Reflexes intact.  SKIN: No ulceration, lesions, rashes, or cyanosis. Skin warm and dry. Turgor intact.  PSYCHIATRIC: Mood, affect within normal limits. The patient is awake, alert and oriented x 3. Insight, judgment intact.      LABORATORY PANEL:   CBC  Recent Labs Lab 10/20/16 0450  WBC 7.1  HGB 7.2*  HCT 20.5*  PLT 151   ------------------------------------------------------------------------------------------------------------------  Chemistries   Recent Labs Lab 10/21/16 0654  NA 135  K 4.4  CL 110  CO2 19*  GLUCOSE 83  BUN 73*  CREATININE 3.96*  CALCIUM 8.3*   ------------------------------------------------------------------------------------------------------------------  Cardiac Enzymes No results for input(s): TROPONINI in the last 168 hours. ------------------------------------------------------------------------------------------------------------------  RADIOLOGY:  No results found.  EKG:   Orders placed or performed during the hospital encounter of 10/04/16  . ED EKG 12-Lead  . ED EKG 12-Lead  . EKG 12-Lead  . EKG 12-Lead    ASSESSMENT AND PLAN:   Brandon Maynard is a 60 y.o. male presenting with Foot Pain . Admitted 10/04/2016 : Day #: 17 days  1. Acute kidney injury on chronic kidney disease stage IV appreciate nephrology input continued improvement 2. Sepsis-improved, cellulitis-Escherichia coli bacteremia Ancef #11/14 with transition to Keflex #0/7 on  discharge 3. Anemia of  chronic disease: stable this morning  Disposition: Patient be discharged to group home in 1 days  All the records are reviewed and case discussed with Care Management/Social Workerr. Management plans discussed with the patient, family and they are in agreement.  CODE STATUS: full TOTAL TIME TAKING CARE OF THIS PATIENT: 28 minutes.   POSSIBLE D/C IN 1-2DAYS, DEPENDING ON CLINICAL CONDITION.   Niccolo Burggraf,  Karenann Cai.D on 10/21/2016 at 11:58 AM  Between 7am to 6pm - Pager - [phone]  After 6pm: House Pager: - [phone]  [facility]  Office  [phone]  CC: Primary care physician; [name], MD

## 2016-10-22 LAB — BASIC METABOLIC PANEL
ANION GAP: 7 (ref 5–15)
BUN: 71 mg/dL — ABNORMAL HIGH (ref 6–20)
CHLORIDE: 108 mmol/L (ref 101–111)
CO2: 21 mmol/L — AB (ref 22–32)
Calcium: 8.5 mg/dL — ABNORMAL LOW (ref 8.9–10.3)
Creatinine, Ser: 3.64 mg/dL — ABNORMAL HIGH (ref 0.61–1.24)
GFR calc non Af Amer: 16 mL/min — ABNORMAL LOW (ref >60)
GFR, EST AFRICAN AMERICAN: 19 mL/min — AB (ref >60)
GLUCOSE: 109 mg/dL — AB (ref 65–99)
Potassium: 3.8 mmol/L (ref 3.5–5.1)
Sodium: 136 mmol/L (ref 135–145)

## 2016-10-22 MED ORDER — CLONAZEPAM 1 MG PO TABS: 1.0000 mg | ORAL_TABLET | Freq: Every day | ORAL | 0 refills | Status: DC

## 2016-10-22 MED ORDER — TRAZODONE HCL 100 MG PO TABS: 100.0000 mg | ORAL_TABLET | Freq: Every day | ORAL | 0 refills | Status: DC

## 2016-10-22 MED ORDER — OXYCODONE HCL 5 MG PO TABS: 5.0000 mg | ORAL_TABLET | Freq: Four times a day (QID) | ORAL | 0 refills | Status: DC | PRN

## 2016-10-22 MED ORDER — CEPHALEXIN 500 MG PO CAPS: 500.0000 mg | ORAL_CAPSULE | Freq: Two times a day (BID) | ORAL | 0 refills | Status: AC

## 2016-10-22 NOTE — Progress Notes (Signed)
Per Dr. Lavetta Nielsen do not have patient to discharge on labetalol

## 2016-10-22 NOTE — Progress Notes (Addendum)
Pt A and O x 4. VSS. Pt tolerating diet well. No complaints of pain or nausea.PICC line removed with tip intact, prescriptions given. Pt and caretaker from group home voiced understanding of discharge instructions with no further questions. Discussed how to perform dressing change both. Patient received a walker prior to being discharged and discussed that permcath would be coming out at a lalter date. Home health has been set-up to come see patient. Pt discharged via wheelchair with nurse tech.

## 2016-10-22 NOTE — Clinical Social Work Note (Signed)
Patient is to discharge today to return to the group home. PT has not assessed in a while and nursing staff had patient ambulate today and he ambulated with his walker from his room to the nurse's station and then back to his room with only contact guard assist. Patient will have home health to follow for PT and RN. RN CM to arrange this. CSW has contacted [name] at [facility]: [phone] and he stated for the nurse to call him at the above number and he will come pick patient up. Shela Leff MSW,LCSW [phone]

## 2016-10-22 NOTE — Discharge Summary (Addendum)
[place] at [place] NAME: Brandon Maynard    MR#:  [id number]  DATE OF BIRTH:  07/07/1956  DATE OF ADMISSION:  10/04/2016 ADMITTING PHYSICIAN: Nicholes Mango, MD  DATE OF DISCHARGE: 10/22/16  PRIMARY CARE PHYSICIAN: [name], MD    ADMISSION DIAGNOSIS:  Cellulitis of left lower extremity [W11.914]  DISCHARGE DIAGNOSIS:  Sepsis present on admission Bacteremia Acute kidney injury on chronic kidney disease   Left leg cellulitis   Schizoaffective disorder ([place])   Thrombocytopenia ([place])    SECONDARY DIAGNOSIS:   Past Medical History:  Diagnosis Date  . Anemia   . Bipolar disorder ([place])   . Cancer ([place])   . Cataract    right eye  . DDD (degenerative disc disease)   . GERD (gastroesophageal reflux disease)   . Heart murmur   . Hyperlipidemia   . Hypertension   . Paranoid schizophrenia ([place])   . 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 Thyroid disease     HOSPITAL COURSE:  Brandon Maynard  is a 60 y.o. male admitted 10/04/2016 with chief complaint Foot Pain . Please see H&P performed by Nicholes Mango, MD for further information. Patient presented with the above symptoms - found to have cellulitis. Meeting sepsis criteria started on broad antibiotics, with positive blood cultures. Worsening renal function, nephrology following - had to undergo HD, he actually declined HD for multiple days prior to agreeing. First HD 10/09/16, last HD 10/13/16 with slowly improving renal function  1. Acute renal failure on chronic kidney disease stage III: baseline creatinine of 2.1 EGFR 32   --- HD as above 2. Left lower extremity cellulitis: with sepsis. E coli from blood cultures 11/6   --- completed 12 days ancef with transition to 9 days Keflex 3. Anemia and thrombocytopenia:  --- Transfused 1 unit prbc   DISCHARGE CONDITIONS:   Stable VasCath in place -- plan on removal as outpatient  CONSULTS OBTAINED:  Treatment Team:  Katha Cabal, MD Gonzella Lex, MD Lequita Asal, MD Leonel Ramsay, MD  DRUG ALLERGIES:   Allergies  Allergen Reactions  . Tetracyclines & Related Hives    DISCHARGE MEDICATIONS:   Current Discharge Medication List    START taking these medications   Details  cephALEXin (KEFLEX) 500 MG capsule Take 1 capsule (500 mg total) by mouth 2 (two) times daily. Qty: 18 capsule, Refills: 0    oxyCODONE (OXY IR/ROXICODONE) 5 MG immediate release tablet Take 1 tablet (5 mg total) by mouth every 6 (six) hours as needed for moderate pain, severe pain or breakthrough pain. Qty: 30 tablet, Refills: 0      CONTINUE these medications which have CHANGED   Details  clonazePAM (KLONOPIN) 1 MG tablet Take 1 tablet (1 mg total) by mouth at bedtime. Qty: 30 tablet, Refills: 0    traZODone (DESYREL) 100 MG tablet Take 1 tablet (100 mg total) by mouth at bedtime. Qty: 30 tablet, Refills: 0      CONTINUE these medications which have NOT CHANGED   Details  asenapine (SAPHRIS) 5 MG SUBL 24 hr tablet Place 5-10 mg under the tongue 2 (two) times daily. Take 74m daily in morning and take 163mdaily at bedtime    bimatoprost (LUMIGAN) 0.01 % SOLN Place 1 drop into both eyes at bedtime.    brimonidine-timolol (COMBIGAN) 0.2-0.5 % ophthalmic solution Place 1 drop into both eyes every 12 (twelve) hours.    divalproex (DEPAKOTE ER) 500 MG 24  hr tablet Take 2 tablets (1,000 mg total) by mouth at bedtime. Qty: 60 tablet, Refills: 0    FLUoxetine (PROZAC) 20 MG capsule Take 40 mg by mouth daily.    Multiple Vitamin (MULTIVITAMIN WITH MINERALS) TABS tablet Take 1 tablet by mouth daily.    pyridOXINE (VITAMIN B-6) 50 MG tablet Take 100 mg by mouth 2 (two) times daily.    simvastatin (ZOCOR) 20 MG tablet TAKE 1 TABLET BY MOUTH EVERY NIGHT AT BEDTIME Qty: 30 tablet, Refills: 0   Associated Diagnoses: Hyperlipidemia    esomeprazole (NEXIUM) 40 MG capsule TAKE 1 CAPSULE BY MOUTH DAILY Qty: 30 capsule, Refills: 0    Associated Diagnoses: Gastroesophageal reflux disease, esophagitis presence not specified    levothyroxine (SYNTHROID, LEVOTHROID) 50 MCG tablet TAKE 1 TABLET BY MOUTH EVERY MORNING Qty: 30 tablet, Refills: 0   Associated Diagnoses: Hypothyroidism      STOP taking these medications     labetalol (NORMODYNE) 100 MG tablet      FLUoxetine (PROZAC) 20 MG tablet      labetalol (NORMODYNE) 100 MG tablet      labetalol (NORMODYNE) 200 MG tablet          DISCHARGE INSTRUCTIONS:  Wound care: twice daily  Silvadene cream to the blistered areas of the left leg then pad with ABD wrap with Kerlix and wrap with an ACE wrap If old silvadene is building up gently wash the leg with a mild soap like Dreft before reapplying the new Silvadene  DIET:  Renal diet  DISCHARGE CONDITION:  Stable  ACTIVITY:  Activity as tolerated  OXYGEN:  Home Oxygen: No.   Oxygen Delivery: room air  DISCHARGE LOCATION:  group home   If you experience worsening of your admission symptoms, develop shortness of breath, life threatening emergency, suicidal or homicidal thoughts you must seek medical attention immediately by calling 911 or calling your MD immediately  if symptoms less severe.  You Must read complete instructions/literature along with all the possible adverse reactions/side effects for all the Medicines you take and that have been prescribed to you. Take any new Medicines after you have completely understood and accpet all the possible adverse reactions/side effects.   Please note  You were cared for by a hospitalist during your hospital stay. If you have any questions about your discharge medications or the care you received while you were in the hospital after you are discharged, you can call the unit and asked to speak with the hospitalist on call if the hospitalist that took care of you is not available. Once you are discharged, your primary care physician will handle any further medical  issues. Please note that NO REFILLS for any discharge medications will be authorized once you are discharged, as it is imperative that you return to your primary care physician (or establish a relationship with a primary care physician if you do not have one) for your aftercare needs so that they can reassess your need for medications and monitor your lab values.    On the day of Discharge:   VITAL SIGNS:  Blood pressure 109/64, pulse 81, temperature 98.4 F (36.9 C), temperature source Oral, resp. rate 16, height _0  (1.905 m), weight 76.1 kg (167 lb 11.2 oz), SpO2 100 %.  I/O:   Intake/Output Summary (Last 24 hours) at 10/22/16 1327 Last data filed at 10/22/16 1147  Gross per 24 hour  Intake              667  ml  Output             2200 ml  Net            -1533 ml    PHYSICAL EXAMINATION:  GENERAL:  60 y.o.-year-old patient lying in the bed with no acute distress.  EYES: Pupils equal, round, reactive to light and accommodation. No scleral icterus. Extraocular muscles intact.  HEENT: Head atraumatic, normocephalic. Oropharynx and nasopharynx clear.  NECK:  Supple, no jugular venous distention. No thyroid enlargement, no tenderness.  LUNGS: Normal breath sounds bilaterally, no wheezing, rales,rhonchi or crepitation. No use of accessory muscles of respiration.  CARDIOVASCULAR: S1, S2 normal. No murmurs, rubs, or gallops.  ABDOMEN: Soft, non-tender, non-distended. Bowel sounds present. No organomegaly or mass.  EXTREMITIES: No pedal edema, cyanosis, or clubbing.  NEUROLOGIC: Cranial nerves II through XII are intact. Muscle strength 5/5 in all extremities. Sensation intact. Gait not checked.  PSYCHIATRIC: The patient is alert and oriented x 3.  SKIN: right vascath in place No obvious rash, lesion, or ulcer.   DATA REVIEW:   CBC  Recent Labs Lab 10/20/16 0450  WBC 7.1  HGB 7.2*  HCT 20.5*  PLT 151    Chemistries   Recent Labs Lab 10/21/16 0654  NA 135  K 4.4  CL 110   CO2 19*  GLUCOSE 83  BUN 73*  CREATININE 3.96*  CALCIUM 8.3*    Cardiac Enzymes No results for input(s): TROPONINI in the last 168 hours.  Microbiology Results  Results for orders placed or performed during the hospital encounter of 10/04/16  Blood Culture (routine x 2)     Status: Abnormal   Collection Time: 10/04/16  3:51 PM  Result Value Ref Range Status   Specimen Description BLOOD RT [facility]  Final   Special Requests BOTTLES DRAWN AEROBIC AND ANAEROBIC 10CC  Final   Culture  Setup Time   Final    Organism ID to follow GRAM NEGATIVE RODS IN BOTH AEROBIC AND ANAEROBIC BOTTLES CRITICAL RESULT CALLED TO, READ BACK BY AND VERIFIED WITH: NATE COOKSON ON 10/05/16 AT 0448 BY TLB CONFIRMED BY TLB    Culture ESCHERICHIA COLI (A)  Final   Report Status 10/07/2016 FINAL  Final   Organism ID, Bacteria ESCHERICHIA COLI  Final      Susceptibility   Escherichia coli - MIC*    AMPICILLIN >=32 RESISTANT Resistant     CEFAZOLIN <=4 SENSITIVE Sensitive     CEFEPIME <=1 SENSITIVE Sensitive     CEFTAZIDIME <=1 SENSITIVE Sensitive     CEFTRIAXONE <=1 SENSITIVE Sensitive     CIPROFLOXACIN <=0.25 SENSITIVE Sensitive     GENTAMICIN <=1 SENSITIVE Sensitive     IMIPENEM <=0.25 SENSITIVE Sensitive     TRIMETH/SULFA >=320 RESISTANT Resistant     AMPICILLIN/SULBACTAM 16 INTERMEDIATE Intermediate     PIP/TAZO <=4 SENSITIVE Sensitive     Extended ESBL NEGATIVE Sensitive     * ESCHERICHIA COLI  Blood Culture ID Panel (Reflexed)     Status: Abnormal   Collection Time: 10/04/16  3:51 PM  Result Value Ref Range Status   Enterococcus species NOT DETECTED NOT DETECTED Final   Listeria monocytogenes NOT DETECTED NOT DETECTED Final   Staphylococcus species NOT DETECTED NOT DETECTED Final   Staphylococcus aureus NOT DETECTED NOT DETECTED Final   Streptococcus species NOT DETECTED NOT DETECTED Final   Streptococcus agalactiae NOT DETECTED NOT DETECTED Final   Streptococcus pneumoniae NOT DETECTED NOT  DETECTED Final   Streptococcus pyogenes NOT DETECTED  NOT DETECTED Final   Acinetobacter baumannii NOT DETECTED NOT DETECTED Final   Enterobacteriaceae species DETECTED (A) NOT DETECTED Final    Comment: CRITICAL RESULT CALLED TO, READ BACK BY AND VERIFIED WITH: NATE COOKSON ON 10/05/16 AT 0448 BY TLB    Enterobacter cloacae complex NOT DETECTED NOT DETECTED Final   Escherichia coli DETECTED (A) NOT DETECTED Final    Comment: CRITICAL RESULT CALLED TO, READ BACK BY AND VERIFIED WITH: NATE COOKSON ON 10/05/16 AT 0448 BY TLB                                                                                                Klebsiella oxytoca NOT DETECTED NOT DETECTED Final   Klebsiella pneumoniae NOT DETECTED NOT DETECTED Final   Proteus species NOT DETECTED NOT DETECTED Final   Serratia marcescens NOT DETECTED NOT DETECTED Final   Carbapenem resistance NOT DETECTED NOT DETECTED Final   Haemophilus influenzae NOT DETECTED NOT DETECTED Final   Neisseria meningitidis NOT DETECTED NOT DETECTED Final   Pseudomonas aeruginosa NOT DETECTED NOT DETECTED Final   Candida albicans NOT DETECTED NOT DETECTED Final   Candida glabrata NOT DETECTED NOT DETECTED Final   Candida krusei NOT DETECTED NOT DETECTED Final   Candida parapsilosis NOT DETECTED NOT DETECTED Final   Candida tropicalis NOT DETECTED NOT DETECTED Final  Blood Culture (routine x 2)     Status: Abnormal   Collection Time: 10/04/16  3:56 PM  Result Value Ref Range Status   Specimen Description BLOOD LT AC  Final   Special Requests BOTTLES DRAWN AEROBIC AND ANAEROBIC 10CC  Final   Culture  Setup Time   Final    GRAM NEGATIVE RODS IN BOTH AEROBIC AND ANAEROBIC BOTTLES CRITICAL RESULT CALLED TO, READ BACK BY AND VERIFIED WITH: NATE COOKSON ON 10/05/16 AT 0448 BY TLB CONFIRMED BY TLB    Culture ESCHERICHIA COLI (A)  Final   Report Status 10/07/2016 FINAL  Final  Urine culture     Status: Abnormal   Collection Time: 10/04/16  9:01 PM    Result Value Ref Range Status   Specimen Description URINE, RANDOM  Final   Special Requests NONE  Final   Culture 30,000 COLONIES/mL ESCHERICHIA COLI (A)  Final   Report Status 10/07/2016 FINAL  Final   Organism ID, Bacteria ESCHERICHIA COLI (A)  Final      Susceptibility   Escherichia coli - MIC*    AMPICILLIN >=32 RESISTANT Resistant     CEFAZOLIN <=4 SENSITIVE Sensitive     CEFTRIAXONE <=1 SENSITIVE Sensitive     CIPROFLOXACIN <=0.25 SENSITIVE Sensitive     GENTAMICIN <=1 SENSITIVE Sensitive     IMIPENEM <=0.25 SENSITIVE Sensitive     NITROFURANTOIN <=16 SENSITIVE Sensitive     TRIMETH/SULFA >=320 RESISTANT Resistant     AMPICILLIN/SULBACTAM 16 INTERMEDIATE Intermediate     PIP/TAZO <=4 SENSITIVE Sensitive     Extended ESBL NEGATIVE Sensitive     * 30,000 COLONIES/mL ESCHERICHIA COLI  MRSA PCR Screening     Status: None   Collection Time: 10/04/16  9:01 PM  Result Value Ref Range  Status   MRSA by PCR NEGATIVE NEGATIVE Final    Comment:        The GeneXpert MRSA Assay (FDA approved for NASAL specimens only), is one component of a comprehensive MRSA colonization surveillance program. It is not intended to diagnose MRSA infection nor to guide or monitor treatment for MRSA infections.   MRSA PCR Screening     Status: None   Collection Time: 10/06/16 11:59 AM  Result Value Ref Range Status   MRSA by PCR NEGATIVE NEGATIVE Final    Comment:        The GeneXpert MRSA Assay (FDA approved for NASAL specimens only), is one component of a comprehensive MRSA colonization surveillance program. It is not intended to diagnose MRSA infection nor to guide or monitor treatment for MRSA infections.   Aerobic Culture (superficial specimen)     Status: None   Collection Time: 10/11/16  4:03 PM  Result Value Ref Range Status   Specimen Description LEG  Final   Special Requests Normal  Final   Gram Stain NO WBC SEEN NO ORGANISMS SEEN   Final   Culture   Final    NO GROWTH 3  DAYS Performed at [facility]    Report Status 10/14/2016 FINAL  Final  CULTURE, BLOOD (ROUTINE X 2) w Reflex to ID Panel     Status: None   Collection Time: 10/13/16  9:33 AM  Result Value Ref Range Status   Specimen Description BLOOD RIGHT HAND  Final   Special Requests   Final    BOTTLES DRAWN AEROBIC AND ANAEROBIC [place]   Culture NO GROWTH 5 DAYS  Final   Report Status 10/18/2016 FINAL  Final  CULTURE, BLOOD (ROUTINE X 2) w Reflex to ID Panel     Status: None   Collection Time: 10/13/16  9:40 AM  Result Value Ref Range Status   Specimen Description BLOOD LEFT ASSIST CONTROL  Final   Special Requests   Final    BOTTLES DRAWN AEROBIC AND ANAEROBIC [place]   Culture NO GROWTH 5 DAYS  Final   Report Status 10/18/2016 FINAL  Final    RADIOLOGY:  No results found.   Management plans discussed with the patient, family and they are in agreement.  CODE STATUS:     Code Status Orders        Start     Ordered   10/04/16 2011  Full code  Continuous     10/04/16 2010    Code Status History    Date Active Date Inactive Code Status Order ID Comments User Context   06/13/2014  6:41 PM 06/28/2014  3:32 PM Full Code [id number]  Earleen Newport, NP Inpatient   06/12/2014  5:07 PM 06/13/2014  6:41 PM Full Code [id number]  Starlyn Skeans, PA-C ED      TOTAL TIME TAKING CARE OF THIS PATIENT: 45 minutes.    Hower,  Karenann Cai.D on 10/22/2016 at 1:27 PM  Between 7am to 6pm - Pager - [phone]  After 6pm go to [email or web address] - Proofreader  [employer] [place]  Office  [phone]  CC: Primary care physician; [name], MD

## 2016-10-22 NOTE — Consult Note (Signed)
Pharmacy Antibiotic Note  Brandon Maynard. is a 60 y.o. male admitted on 10/04/2016 with bacteremia, cellulitis and UTI.  Patient has ecoli bacteremia and UTI.  Per ID note continue Cefazolin for 14 days then transition to keflex for one week. Today is day 12 of IV antibiotics.   Plan: Continue cefazolin 1 g IV q12h.   Patient has HD catheter and was receiving intermittent hemodialysis as needed by nephrology. Last HD session was 11/15 and catheter may be removed soon. Will dose based on CrCl.    Height: 6\' 3"  (190.5 cm) Weight: 167 lb 11.2 oz (76.1 kg) IBW/kg (Calculated) : 84.5  Temp (24hrs), Avg:98.4 F (36.9 C), Min:98 F (36.7 C), Max:99.1 F (37.3 C)   Recent Labs Lab 10/17/16 0549 10/18/16 0955 10/19/16 0630 10/20/16 0450 10/21/16 0654  WBC  --   --  7.8 7.1  --   CREATININE 4.61* 4.71* 4.68* 4.44* 3.96*    Estimated Creatinine Clearance: 20.8 mL/min (by C-G formula based on SCr of 3.96 mg/dL (H)).    Allergies  Allergen Reactions  . Tetracyclines & Related Hives    Antimicrobials this admission: vancomycin 11/6 >> 11/6, 11/12>> 11/13 zosyn 11/6 >> 11/6 Meropenem 11/7>> 11/9, 11/12>>11/13 CTX 11/9 >> 11/10 Keflex 11/10>>11/12 Cefazolin 11/13 >>  Dose adjustments this admission:   Microbiology results: 11/15 BCx x2 sent 11/13 WCx: NGTD 11/6 BCx: ecoli x2 11/6 UCx: 30,000 EColi 11/6 MRSA PCR: neg  Thank you for allowing pharmacy to be a part of this patient's care.  Loree Fee, PharmD Clinical Pharmacist 10/22/2016 10:35 AM

## 2016-10-22 NOTE — Clinical Social Work Note (Signed)
CSW spoke with patient's brother, Marlou Sa, this afternoon via phone. Marlou Sa was not happy with the fact that his brother was returning to the family care home and stated he had called several days ago and left a message that he did want him to return and that he wanted another group home. CSW informed him that even though we were empathetic with his situation, that a new family care home cannot be found on the day of discharge. CSW explained that he and I had spoken earlier on in patient's admission and that he expressed no concerns with patient returning to his current group home: [facility]. CSW also explained that there would be home health arranged by the RN CM and that a nurse and physical therapist would be coming out to follow patient at the group home. Patient's brother verbalized understanding and wanted to make sure that the home health agency had his contact information as he wanted to be involved in his brother's care. CSW provided his contact information to [place], the RN CM, and asked this to be provided to [place].  Shela Leff MSW,LCSW [phone]

## 2016-10-22 NOTE — Progress Notes (Signed)
[facility]  ROUNDING NOTE   Subjective:   S Cr still critically high but slight improvement noted 4.68->4.44->3.96.   BUN lower to 73. Today's lab results are pending. Urine output consistent at 2725 cc No shortness of breath No edema good urine output However,  Albumin severely low at 1.5; Hgb 7.2  Objective:  Vital signs in last 24 hours:  Temp:  [98 F (36.7 C)-99.1 F (37.3 C)] 98.1 F (36.7 C) (11/24 0537) Pulse Rate:  [67-71] 71 (11/24 0537) Resp:  [18-20] 20 (11/24 0537) BP: (106-113)/(57-70) 113/64 (11/24 0537) SpO2:  [100 %] 100 % (11/24 0537) Weight:  [76.1 kg (167 lb 11.2 oz)] 76.1 kg (167 lb 11.2 oz) (11/24 0436)  Weight change:  Filed Weights   10/13/16 1705 10/14/16 1504 10/22/16 0436  Weight: 91.7 kg (202 lb 2.6 oz) 91.6 kg (202 lb) 76.1 kg (167 lb 11.2 oz)    Intake/Output: I/O last 3 completed shifts: In: 614 [P.O.:564; IV Piggyback:50] Out: 4325 [Urine:4325]   Intake/Output this shift:  Total I/O In: 240 [P.O.:240] Out: 400 [Urine:400]  Physical Exam: General: Laying in bed  Head: Normocephalic, atraumatic. Moist oral mucosal membranes  Eyes: Anicteric  Neck: Supple,   Lungs:  Clear to auscultation, normal effort  Heart: S1S2 no rubs  Abdomen:  Soft, nontender, BS present  Extremities: trace edema   Neurologic: Awake, follows commands  Skin: Left foot bandaged  Access:  Right IJ permcath 11/16 Dr. Wyn Quaker,    Basic Metabolic Panel:  Recent Labs Lab 10/16/16 0704 10/17/16 0549 10/18/16 0955 10/19/16 0630 10/20/16 0450 10/21/16 0654  NA 137 137 136 134* 133* 135  K 4.9 4.8 4.7 4.7 4.4 4.4  CL 110 111 109 110 108 110  CO2 19* 20* 19* 18* 19* 19*  GLUCOSE 97 121* 148* 117* 108* 83  BUN 67* 75* 80* 75* 81* 73*  CREATININE 4.39* 4.61* 4.71* 4.68* 4.44* 3.96*  CALCIUM 7.7* 7.8* 8.1* 8.3* 8.1* 8.3*  PHOS 5.3* 4.2 4.9*  --   --  4.9*    Liver Function Tests:  Recent Labs Lab 10/16/16 0704 10/17/16 0549 10/18/16 0955  10/21/16 0654  ALBUMIN 1.3* 1.5* 1.5* 1.5*   No results for input(s): LIPASE, AMYLASE in the last 168 hours. No results for input(s): AMMONIA in the last 168 hours.  CBC:  Recent Labs Lab 10/19/16 0630 10/19/16 1451 10/19/16 2315 10/20/16 0450  WBC 7.8  --   --  7.1  HGB 6.3* 6.7* 6.8* 7.2*  HCT 18.6* 19.8* 19.9* 20.5*  MCV 88.7  --   --  87.8  PLT 154  --   --  151    Cardiac Enzymes: No results for input(s): CKTOTAL, CKMB, CKMBINDEX, TROPONINI in the last 168 hours.  BNP: Invalid input(s): POCBNP  CBG: No results for input(s): GLUCAP in the last 168 hours.  Microbiology: Results for orders placed or performed during the hospital encounter of 10/04/16  Blood Culture (routine x 2)     Status: Abnormal   Collection Time: 10/04/16  3:51 PM  Result Value Ref Range Status   Specimen Description BLOOD RT [facility]  Final   Special Requests BOTTLES DRAWN AEROBIC AND ANAEROBIC 10CC  Final   Culture  Setup Time   Final    Organism ID to follow GRAM NEGATIVE RODS IN BOTH AEROBIC AND ANAEROBIC BOTTLES CRITICAL RESULT CALLED TO, READ BACK BY AND VERIFIED WITH: NATE COOKSON ON 10/05/16 AT 0448 BY TLB CONFIRMED BY TLB    Culture  ESCHERICHIA COLI (A)  Final   Report Status 10/07/2016 FINAL  Final   Organism ID, Bacteria ESCHERICHIA COLI  Final      Susceptibility   Escherichia coli - MIC*    AMPICILLIN >=32 RESISTANT Resistant     CEFAZOLIN <=4 SENSITIVE Sensitive     CEFEPIME <=1 SENSITIVE Sensitive     CEFTAZIDIME <=1 SENSITIVE Sensitive     CEFTRIAXONE <=1 SENSITIVE Sensitive     CIPROFLOXACIN <=0.25 SENSITIVE Sensitive     GENTAMICIN <=1 SENSITIVE Sensitive     IMIPENEM <=0.25 SENSITIVE Sensitive     TRIMETH/SULFA >=320 RESISTANT Resistant     AMPICILLIN/SULBACTAM 16 INTERMEDIATE Intermediate     PIP/TAZO <=4 SENSITIVE Sensitive     Extended ESBL NEGATIVE Sensitive     * ESCHERICHIA COLI  Blood Culture ID Panel (Reflexed)     Status: Abnormal   Collection Time: 10/04/16   3:51 PM  Result Value Ref Range Status   Enterococcus species NOT DETECTED NOT DETECTED Final   Listeria monocytogenes NOT DETECTED NOT DETECTED Final   Staphylococcus species NOT DETECTED NOT DETECTED Final   Staphylococcus aureus NOT DETECTED NOT DETECTED Final   Streptococcus species NOT DETECTED NOT DETECTED Final   Streptococcus agalactiae NOT DETECTED NOT DETECTED Final   Streptococcus pneumoniae NOT DETECTED NOT DETECTED Final   Streptococcus pyogenes NOT DETECTED NOT DETECTED Final   Acinetobacter baumannii NOT DETECTED NOT DETECTED Final   Enterobacteriaceae species DETECTED (A) NOT DETECTED Final    Comment: CRITICAL RESULT CALLED TO, READ BACK BY AND VERIFIED WITH: NATE COOKSON ON 10/05/16 AT 0448 BY TLB    Enterobacter cloacae complex NOT DETECTED NOT DETECTED Final   Escherichia coli DETECTED (A) NOT DETECTED Final    Comment: CRITICAL RESULT CALLED TO, READ BACK BY AND VERIFIED WITH: NATE COOKSON ON 10/05/16 AT 0448 BY TLB                                                                                                Klebsiella oxytoca NOT DETECTED NOT DETECTED Final   Klebsiella pneumoniae NOT DETECTED NOT DETECTED Final   Proteus species NOT DETECTED NOT DETECTED Final   Serratia marcescens NOT DETECTED NOT DETECTED Final   Carbapenem resistance NOT DETECTED NOT DETECTED Final   Haemophilus influenzae NOT DETECTED NOT DETECTED Final   Neisseria meningitidis NOT DETECTED NOT DETECTED Final   Pseudomonas aeruginosa NOT DETECTED NOT DETECTED Final   Candida albicans NOT DETECTED NOT DETECTED Final   Candida glabrata NOT DETECTED NOT DETECTED Final   Candida krusei NOT DETECTED NOT DETECTED Final   Candida parapsilosis NOT DETECTED NOT DETECTED Final   Candida tropicalis NOT DETECTED NOT DETECTED Final  Blood Culture (routine x 2)     Status: Abnormal   Collection Time: 10/04/16  3:56 PM  Result Value Ref Range Status   Specimen Description BLOOD LT AC  Final   Special  Requests BOTTLES DRAWN AEROBIC AND ANAEROBIC 10CC  Final   Culture  Setup Time   Final    GRAM NEGATIVE RODS IN BOTH AEROBIC AND ANAEROBIC BOTTLES CRITICAL RESULT CALLED TO,  READ BACK BY AND VERIFIED WITH: NATE COOKSON ON 10/05/16 AT 0448 BY TLB CONFIRMED BY TLB    Culture ESCHERICHIA COLI (A)  Final   Report Status 10/07/2016 FINAL  Final  Urine culture     Status: Abnormal   Collection Time: 10/04/16  9:01 PM  Result Value Ref Range Status   Specimen Description URINE, RANDOM  Final   Special Requests NONE  Final   Culture 30,000 COLONIES/mL ESCHERICHIA COLI (A)  Final   Report Status 10/07/2016 FINAL  Final   Organism ID, Bacteria ESCHERICHIA COLI (A)  Final      Susceptibility   Escherichia coli - MIC*    AMPICILLIN >=32 RESISTANT Resistant     CEFAZOLIN <=4 SENSITIVE Sensitive     CEFTRIAXONE <=1 SENSITIVE Sensitive     CIPROFLOXACIN <=0.25 SENSITIVE Sensitive     GENTAMICIN <=1 SENSITIVE Sensitive     IMIPENEM <=0.25 SENSITIVE Sensitive     NITROFURANTOIN <=16 SENSITIVE Sensitive     TRIMETH/SULFA >=320 RESISTANT Resistant     AMPICILLIN/SULBACTAM 16 INTERMEDIATE Intermediate     PIP/TAZO <=4 SENSITIVE Sensitive     Extended ESBL NEGATIVE Sensitive     * 30,000 COLONIES/mL ESCHERICHIA COLI  MRSA PCR Screening     Status: None   Collection Time: 10/04/16  9:01 PM  Result Value Ref Range Status   MRSA by PCR NEGATIVE NEGATIVE Final    Comment:        The GeneXpert MRSA Assay (FDA approved for NASAL specimens only), is one component of a comprehensive MRSA colonization surveillance program. It is not intended to diagnose MRSA infection nor to guide or monitor treatment for MRSA infections.   MRSA PCR Screening     Status: None   Collection Time: 10/06/16 11:59 AM  Result Value Ref Range Status   MRSA by PCR NEGATIVE NEGATIVE Final    Comment:        The GeneXpert MRSA Assay (FDA approved for NASAL specimens only), is one component of a comprehensive MRSA  colonization surveillance program. It is not intended to diagnose MRSA infection nor to guide or monitor treatment for MRSA infections.   Aerobic Culture (superficial specimen)     Status: None   Collection Time: 10/11/16  4:03 PM  Result Value Ref Range Status   Specimen Description LEG  Final   Special Requests Normal  Final   Gram Stain NO WBC SEEN NO ORGANISMS SEEN   Final   Culture   Final    NO GROWTH 3 DAYS Performed at [facility]    Report Status 10/14/2016 FINAL  Final  CULTURE, BLOOD (ROUTINE X 2) w Reflex to ID Panel     Status: None   Collection Time: 10/13/16  9:33 AM  Result Value Ref Range Status   Specimen Description BLOOD RIGHT HAND  Final   Special Requests   Final    BOTTLES DRAWN AEROBIC AND ANAEROBIC [place]   Culture NO GROWTH 5 DAYS  Final   Report Status 10/18/2016 FINAL  Final  CULTURE, BLOOD (ROUTINE X 2) w Reflex to ID Panel     Status: None   Collection Time: 10/13/16  9:40 AM  Result Value Ref Range Status   Specimen Description BLOOD LEFT ASSIST CONTROL  Final   Special Requests   Final    BOTTLES DRAWN AEROBIC AND ANAEROBIC [place]   Culture NO GROWTH 5 DAYS  Final   Report Status 10/18/2016 FINAL  Final    Coagulation  Studies: No results for input(s): LABPROT, INR in the last 72 hours.  Urinalysis: No results for input(s): COLORURINE, LABSPEC, PHURINE, GLUCOSEU, HGBUR, BILIRUBINUR, KETONESUR, PROTEINUR, UROBILINOGEN, NITRITE, LEUKOCYTESUR in the last 72 hours.  Invalid input(s): APPERANCEUR    Imaging: No results found.   Medications:    . asenapine  10 mg Sublingual BID  . brimonidine  1 drop Both Eyes Q12H   And  . timolol  1 drop Both Eyes Q12H  .  ceFAZolin (ANCEF) IV  1 g Intravenous Q12H  . docusate sodium  100 mg Oral BID  . ferrous gluconate  324 mg Oral BID WC  . FLUoxetine  40 mg Oral Daily  . latanoprost  1 drop Both Eyes QHS  . levothyroxine  50 mcg Oral QAC breakfast  . multivitamin  with minerals  1 tablet Oral Daily  . polyethylene glycol  17 g Oral Daily  . pyridOXINE  100 mg Oral BID  . senna  1 tablet Oral Daily  . silver sulfADIAZINE   Topical BID  . simvastatin  20 mg Oral QHS  . traZODone  100 mg Oral QHS   sodium chloride, sodium chloride, acetaminophen, alteplase, alum & mag hydroxide-simeth, haloperidol lactate, heparin, lidocaine (PF), lidocaine-prilocaine, ondansetron **OR** ondansetron (ZOFRAN) IV, oxyCODONE, pentafluoroprop-tetrafluoroeth  Assessment/ Plan:  60 y.o. white male with past medication history of anemia, bipolar disorder, degenerative disc disease, GERD, hyperlipidemia, hypertension, paranoid schizophrenia, who was admitted to [facility] on 10/04/2016 for evaluation of left leg swelling and redness.   1.  Acute renal failure on chronic kidney disease stage III: baseline creatinine of 2.1 EGFR 32 with proteinuria Acute renal failure secondary to sepsis, hypotension and ATN; Chronic kidney disease secondary to hypertension  Required renal replacement. dialysis treatments 11/11 and 11/12. And Last Hemodialysis 11/15 due to hyperkalemia, and metabolic acidosis UOP 2482 cc  S Cr improved to 3.96 without dialysis. Today's lab results are pending - Monitor daily for dialysis need.  - If serum creatinine is lower today, may be able to remove dialysis catheter  2.  Iron deficency Anemia   Hematology consulted on 11/9 PRBC transfusion on 11/16, 11/21 Hemoglobin  7.2  Iron deficiency noted. Start oral iron. Avoid iv iron while he has active infection   3.  Left lower extremity cellulitis: with sepsis. E coli from blood cultures 11/6  Plan as per IM team     LOS: 18 Axcel Horsch 11/24/201710:01 AM

## 2016-10-22 NOTE — Care Management Important Message (Signed)
Important Message  Patient Details  Name: Brandon Maynard. MRN: [id number] Date of Birth: May 24, 1956   Medicare Important Message Given:  Yes    Emorie Mcfate A, RN 10/22/2016, 7:52 AM

## 2016-10-22 NOTE — Care Management Note (Addendum)
Case Management Note  Patient Details  Name: Brandon Maynard. MRN: [id number] Date of Birth: November 15, 1956  Subjective/Objective:   Per [name] at [facility], the group home does not have a particular home health agency that they use. Left messages requesting a call back from brothers Delfino Lovett and Jori Moll, and [employer]. Home health services and frequency of visits was discussed with [name] from [facility]. A referral for home health PT, RN, Aide and a RW was called to Sunday Corn at [facility].  Brother Marlou Sa appears to be the Media planner for [name]. He was updated that a RW is being delivered to [name] today at [facility], and that [name] will be receiving home health PT, RN, Aide. Sunday Corn at [facility] was asked that Advanced call Dean for any questions or if any co-pays would be incurred for home health services.                 Action/Plan:   Expected Discharge Date:                  Expected Discharge Plan:     In-House Referral:     Discharge planning Services     Post Acute Care Choice:    Choice offered to:     DME Arranged:    DME Agency:     HH Arranged:    HH Agency:     Status of Service:     If discussed at [employer] of Stay Meetings, dates discussed:    Additional Comments:  Varetta Chavers A, RN 10/22/2016, 2:37 PM

## 2016-10-22 NOTE — Care Management Note (Signed)
Case Management Note  Patient Details  Name: Brandon Maynard. MRN: [id number] Date of Birth: [birth year]  Subjective/Objective:      Discussed discharge planning with Dr Lavetta Nielsen per discharge back to group home today. Creatinine continues to trend down. Permacath to be removed either today or in an outpatient setting. No case management needs.               Action/Plan:   Expected Discharge Date:                  Expected Discharge Plan:     In-House Referral:     Discharge planning Services     Post Acute Care Choice:    Choice offered to:     DME Arranged:    DME Agency:     HH Arranged:    HH Agency:     Status of Service:     If discussed at [employer] of Stay Meetings, dates discussed:    Additional Comments:  Laurajean Hosek A, RN 10/22/2016, 12:39 PM

## 2016-10-22 NOTE — NC FL2 (Signed)
[place] LEVEL OF CARE SCREENING TOOL     IDENTIFICATION  Patient Name: Brandon Maynard. Birthdate: Feb 25, 1956 Sex: male Admission Date (Current Location): 10/04/2016  [facility] and [place] Number:  Engineering geologist and Address:  [facility], [address] [place], [place] [address]      Provider Number: [id number]  Attending Physician Name and Address:  Lytle Butte, MD  Relative Name and Phone Number:       Current Level of Care: Hospital Recommended Level of Care: [facility] Prior Approval Number:    Date Approved/Denied:   PASRR Number:    Discharge Plan:  (family care home)    Current Diagnoses: Patient Active Problem List   Diagnosis Date Noted  . Acute delirium 10/05/2016  . Left leg cellulitis 10/04/2016  . B12 deficiency 07/06/2016  . Anemia 06/16/2016  . Thrombocytopenia ([place]) 06/16/2016  . Weight loss 06/16/2016  . Esophageal reflux 05/12/2016  . DDD (degenerative disc disease), 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  . 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 Schizoaffective disorder ([place]) 06/13/2014    Orientation RESPIRATION BLADDER Height & Weight     Self, Place  Normal Continent Weight: 167 lb 11.2 oz (76.1 kg) Height:  6\' 3"  (190.5 cm)  BEHAVIORAL SYMPTOMS/MOOD NEUROLOGICAL BOWEL NUTRITION STATUS   (none)  (none) Continent Diet (2 gm na)  AMBULATORY STATUS COMMUNICATION OF NEEDS Skin   Supervision Verbally Surgical wounds                       Personal Care Assistance Level of Assistance  Bathing, Dressing, Feeding Bathing Assistance: Limited assistance Feeding assistance: Limited assistance Dressing Assistance: Limited assistance     Functional Limitations Info   (no issues)          SPECIAL CARE FACTORS FREQUENCY   (home health for rn and pt)                    Contractures Contractures Info: Not present    Additional Factors Info  Code Status Code Status Info: full              DISCHARGE MEDICATIONS:       Current Discharge Medication List        START taking these medications   Details  cephALEXin (KEFLEX) 500 MG capsule Take 1 capsule (500 mg total) by mouth 2 (two) times daily. Qty: 18 capsule, Refills: 0    oxyCODONE (OXY IR/ROXICODONE) 5 MG immediate release tablet Take 1 tablet (5 mg total) by mouth every 6 (six) hours as needed for moderate pain, severe pain or breakthrough pain. Qty: 30 tablet, Refills: 0          CONTINUE these medications which have CHANGED   Details  clonazePAM (KLONOPIN) 1 MG tablet Take 1 tablet (1 mg total) by mouth at bedtime. Qty: 30 tablet, Refills: 0    traZODone (DESYREL) 100 MG tablet Take 1 tablet (100 mg total) by mouth at bedtime. Qty: 30 tablet, Refills: 0          CONTINUE these medications which have NOT CHANGED   Details  asenapine (SAPHRIS) 5 MG SUBL 24 hr tablet Place 5-10 mg under the tongue 2 (two) times daily. Take 5mg  daily in morning and take 10mg  daily at bedtime    bimatoprost (LUMIGAN) 0.01 % SOLN Place 1 drop into both eyes at bedtime.    brimonidine-timolol (COMBIGAN) 0.2-0.5 % ophthalmic solution Place 1 drop into  both eyes every 12 (twelve) hours.    divalproex (DEPAKOTE ER) 500 MG 24 hr tablet Take 2 tablets (1,000 mg total) by mouth at bedtime. Qty: 60 tablet, Refills: 0    FLUoxetine (PROZAC) 20 MG capsule Take 40 mg by mouth daily.    labetalol (NORMODYNE) 100 MG tablet Take 100 mg by mouth 2 (two) times daily.    Multiple Vitamin (MULTIVITAMIN WITH MINERALS) TABS tablet Take 1 tablet by mouth daily.    pyridOXINE (VITAMIN B-6) 50 MG tablet Take 100 mg by mouth 2 (two) times daily.    simvastatin (ZOCOR) 20 MG tablet TAKE 1 TABLET BY MOUTH EVERY NIGHT AT BEDTIME Qty: 30 tablet, Refills: 0   Associated Diagnoses: Hyperlipidemia    esomeprazole (NEXIUM) 40 MG capsule TAKE 1 CAPSULE BY MOUTH DAILY Qty: 30 capsule, Refills: 0   Associated Diagnoses:  Gastroesophageal reflux disease, esophagitis presence not specified    levothyroxine (SYNTHROID, LEVOTHROID) 50 MCG tablet TAKE 1 TABLET BY MOUTH EVERY MORNING Qty: 30 tablet, Refills: 0   Associated Diagnoses: Hypothyroidism         STOP taking these medications     FLUoxetine (PROZAC) 20 MG tablet         Relevant Imaging Results:  Relevant Lab Results:   Additional Information    Shela Leff, LCSW

## 2016-10-25 ENCOUNTER — Other Ambulatory Visit: Payer: Self-pay | Admitting: Family Medicine

## 2016-10-25 DIAGNOSIS — E785 Hyperlipidemia, unspecified: Secondary | ICD-10-CM

## 2016-10-25 DIAGNOSIS — E039 Hypothyroidism, unspecified: Secondary | ICD-10-CM

## 2016-10-25 DIAGNOSIS — K219 Gastro-esophageal reflux disease without esophagitis: Secondary | ICD-10-CM

## 2016-10-26 ENCOUNTER — Other Ambulatory Visit: Payer: Self-pay | Admitting: Family Medicine

## 2016-11-02 ENCOUNTER — Encounter: Payer: Self-pay | Admitting: Family Medicine

## 2016-11-02 ENCOUNTER — Ambulatory Visit (INDEPENDENT_AMBULATORY_CARE_PROVIDER_SITE_OTHER): Payer: Medicare Other | Admitting: Family Medicine

## 2016-11-02 ENCOUNTER — Inpatient Hospital Stay: Payer: Self-pay | Admitting: Family Medicine

## 2016-11-02 VITALS — BP 122/82 | HR 88 | Temp 97.5°F | Resp 16 | Ht 75.0 in | Wt 173.0 lb

## 2016-11-02 DIAGNOSIS — E039 Hypothyroidism, unspecified: Secondary | ICD-10-CM | POA: Diagnosis not present

## 2016-11-02 DIAGNOSIS — K219 Gastro-esophageal reflux disease without esophagitis: Secondary | ICD-10-CM | POA: Diagnosis not present

## 2016-11-02 DIAGNOSIS — Z09 Encounter for follow-up examination after completed treatment for conditions other than malignant neoplasm: Secondary | ICD-10-CM

## 2016-11-02 DIAGNOSIS — E785 Hyperlipidemia, unspecified: Secondary | ICD-10-CM

## 2016-11-02 NOTE — Progress Notes (Signed)
Name: Brandon Maynard.   MRN: [id number]    DOB: 01/28/1956   Date:11/02/2016       Progress Note  Subjective  Chief Complaint  Chief Complaint  Patient presents with  . Follow-up    hospt. follow up on the Lt foot.    Patient presents for hospital followup and initiate FL-2 form.   Thyroid Problem  Presents for follow-up visit. Symptoms include depressed mood and leg swelling. Patient reports no anxiety, cold intolerance, constipation, diaphoresis, diarrhea, dry skin, fatigue, hair loss, heat intolerance, hoarse voice, nail problem, palpitations, tremors, visual change, weight gain or weight loss. The symptoms have been stable. His past medical history is significant for hyperlipidemia. There is no history of diabetes.  Gastroesophageal Reflux  He reports no abdominal pain, no belching, no chest pain, no choking, no coughing, no dysphagia, no early satiety, no globus sensation, no heartburn, no hoarse voice, no nausea, no sore throat, no stridor, no tooth decay, no water brash or no wheezing. This is a recurrent problem. The current episode started more than 1 year ago. The problem occurs frequently. The problem has been gradually worsening. The symptoms are aggravated by certain foods. Pertinent negatives include no anemia, fatigue, melena, muscle weakness, orthopnea or weight loss. There are no known risk factors. He has tried nothing for the symptoms. The treatment provided mild relief. Past procedures do not include an abdominal ultrasound, an EGD, esophageal manometry, esophageal pH monitoring, H. pylori antibody titer or a UGI.  Hyperlipidemia  This is a chronic problem. The current episode started more than 1 year ago. The problem is controlled. Recent lipid tests were reviewed and are normal. He has no history of chronic renal disease, diabetes, hypothyroidism, liver disease, obesity or nephrotic syndrome. There are no known factors aggravating his hyperlipidemia. Pertinent negatives  include no chest pain, focal sensory loss, focal weakness, leg pain, myalgias or shortness of breath. Current antihyperlipidemic treatment includes statins. The current treatment provides mild improvement of lipids. There are no compliance problems.     No problem-specific Assessment & Plan notes found for this encounter.   Past Medical History:  Diagnosis Date  . Anemia   . Bipolar disorder ([place])   . Cancer ([place])   . Cataract    right eye  . DDD (degenerative disc disease)   . GERD (gastroesophageal reflux disease)   . Heart murmur   . Hyperlipidemia   . Hypertension   . Paranoid schizophrenia ([place])   . 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 Thyroid disease     Past Surgical History:  Procedure Laterality Date  . Dermal Abrasion  1973  . PERIPHERAL VASCULAR CATHETERIZATION N/A 10/14/2016   Procedure: Dialysis/Perma Catheter Insertion;  Surgeon: Algernon Huxley, MD;  Location: [place] [facility];  Service: Cardiovascular;  Laterality: N/A;    Family History  Problem Relation Age of Onset  . Brain cancer Maternal Uncle   . Breast cancer Mother   . Melanoma Mother   . Congestive Heart Failure Father   . Melanoma Father   . Breast cancer Maternal Aunt   . Diabetes Maternal Aunt     Social History   Social History  . Marital status: Single    Spouse name: N/A  . Number of children: N/A  . Years of education: N/A   Occupational History  . Not on file.   Social History Main Topics  . Smoking status: Current Every Day Smoker    Last attempt to quit: 04/09/1991  .  Smokeless tobacco: Never Used  . Alcohol use No  . Drug use: No  . Sexual activity: Not on file   Other Topics Concern  . Not on file   Social History Narrative  . No narrative on file    Allergies  Allergen Reactions  . Tetracyclines & Related Hives     Review of Systems  Constitutional: Negative for chills, diaphoresis, fatigue, fever, malaise/fatigue, weight gain and weight loss.  HENT: Negative  for ear discharge, ear pain, hoarse voice and sore throat.   Eyes: Negative for blurred vision.  Respiratory: Negative for cough, sputum production, choking, shortness of breath and wheezing.   Cardiovascular: Negative for chest pain, palpitations and leg swelling.  Gastrointestinal: Negative for abdominal pain, blood in stool, constipation, diarrhea, dysphagia, heartburn, melena and nausea.  Genitourinary: Negative for dysuria, frequency, hematuria and urgency.  Musculoskeletal: Negative for back pain, joint pain, myalgias, muscle weakness and neck pain.  Skin: Negative for rash.  Neurological: Negative for dizziness, tingling, tremors, sensory change, focal weakness and headaches.  Endo/Heme/Allergies: Negative for environmental allergies, cold intolerance, heat intolerance and polydipsia. Does not bruise/bleed easily.  Psychiatric/Behavioral: Negative for depression and suicidal ideas. The patient is not nervous/anxious and does not have insomnia.      Objective  Vitals:   11/02/16 1111  BP: 122/82  Pulse: 88  Resp: 16  Temp: 97.5 F (36.4 C)  Weight: 173 lb (78.5 kg)  Height: 6\' 3"  (1.905 m)    Physical Exam  Constitutional: He is oriented to person, place, and time and well-developed, well-nourished, and in no distress.  HENT:  Head: Normocephalic.  Right Ear: External ear normal.  Left Ear: External ear normal.  Nose: Nose normal.  Mouth/Throat: Oropharynx is clear and moist.  Eyes: Conjunctivae and EOM are normal. Pupils are equal, round, and reactive to light. Right eye exhibits no discharge. Left eye exhibits no discharge. No scleral icterus.  Neck: Normal range of motion. Neck supple. No JVD present. No tracheal deviation present. No thyromegaly present.  Cardiovascular: Normal rate, regular rhythm, normal heart sounds and intact distal pulses.  Exam reveals no gallop and no friction rub.   No murmur heard. Pulmonary/Chest: Breath sounds normal. No respiratory  distress. He has no wheezes. He has no rales.  Abdominal: Soft. Bowel sounds are normal. He exhibits no mass. There is no hepatosplenomegaly. There is no tenderness. There is no rebound, no guarding and no CVA tenderness.  Musculoskeletal: Normal range of motion. He exhibits no edema or tenderness.  Lymphadenopathy:    He has no cervical adenopathy.  Neurological: He is alert and oriented to person, place, and time. He has normal sensation, normal strength, normal reflexes and intact cranial nerves. No cranial nerve deficit.  Skin: Skin is warm. No rash noted.  Psychiatric: Mood and affect normal.  Nursing note and vitals reviewed.     Assessment & Plan  Problem List Items Addressed This Visit      Digestive   Esophageal reflux    Other Visit Diagnoses    Hospital discharge follow-up    -  Primary   Hyperlipidemia, unspecified hyperlipidemia type       Hypothyroidism, unspecified type            Dr. [facility]  11/02/16

## 2016-11-04 ENCOUNTER — Inpatient Hospital Stay: Payer: Medicare Other

## 2016-11-04 ENCOUNTER — Inpatient Hospital Stay: Payer: Medicare Other | Admitting: Hematology and Oncology

## 2016-11-04 NOTE — Progress Notes (Unsigned)
[facility] [facility] day:  11/04/16  Chief Complaint: Brandon Maynard. is a 60 y.o. male with anemia and thrombocytopenia who is seen for 2 month assessment.  HPI:   The patient was last seen in the hematology clinic on 09/02/2016/2017.  At that time, PET scan was reviewed.  PET scan on 09/01/2016 revealed no hypermetabolic source of primary malignancy.  There was mildly hypermetabolic patchy ground-glass in the inferior right lower lobe, likely infectious or inflammatory in etiology.   There was no nodular or masslike features.  He was admitted to [facility] from 10/04/2016 - 10/22/2016 with sepsis and cellulitis of the left foot.  Blood cultures on 10/04/2016 were positive for E coli.  He completed 12 days of Ancef with plan to transition to days days of Keflex.  Course was compliced by acute renal failure requiring dialysis from 10/09/2016 - 10/13/2016.  Creatinine was 3.64 on 10/22/2016.  He was transfused with 1 unit of PRBCs.   CBC on 10/20/2016 revealed a hematocrit of 20.5, hemoglobin 7.2, MCV 87.8, platelets 151,000 and WBC 7000.   Past Medical History:  Diagnosis Date  . Anemia   . Bipolar disorder ([place])   . Cancer ([place])   . Cataract    right eye  . DDD (degenerative disc disease)   . GERD (gastroesophageal reflux disease)   . Heart murmur   . Hyperlipidemia   . Hypertension   . Paranoid schizophrenia ([place])   . 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 Thyroid disease     Past Surgical History:  Procedure Laterality Date  . Dermal Abrasion  1973  . PERIPHERAL VASCULAR CATHETERIZATION N/A 10/14/2016   Procedure: Dialysis/Perma Catheter Insertion;  Surgeon: Algernon Huxley, MD;  Location: [place] [facility];  Service: Cardiovascular;  Laterality: N/A;    Family History  Problem Relation Age of Onset  . Brain cancer Maternal Uncle   . Breast cancer Mother   . Melanoma Mother   . Congestive Heart Failure Father   . Melanoma Father   . Breast cancer  Maternal Aunt   . Diabetes Maternal Aunt     Social History:  reports that he has been smoking.  He has never used smokeless tobacco. He reports that he does not drink alcohol or use drugs.  He states that his mother died the week before Easter 02/19/2016).  The patient states that he took drugs when he was younger (age [age]).  He took LSD, speed, and Karle Starch (marijuana).  He never did IV drugs.  He previously drank alcohol.  He stopped drinking wine 30 years ago.  He lives in the [place].  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  Contact number [phone].  He is accompanied by Brandon Maynard from the group home today.  Allergies:  Allergies  Allergen Reactions  . Tetracyclines & Related Hives    Current Medications: Current Outpatient Prescriptions  Medication Sig Dispense Refill  . asenapine (SAPHRIS) 5 MG SUBL 24 hr tablet Place 5-10 mg under the tongue 2 (two) times daily. Take '5mg'$  daily in morning and take '10mg'$  daily at bedtime    . bimatoprost (LUMIGAN) 0.01 % SOLN Place 1 drop into both eyes at bedtime.    . brimonidine-timolol (COMBIGAN) 0.2-0.5 % ophthalmic solution Place 1 drop into both eyes every 12 (twelve) hours.    . clonazePAM (KLONOPIN) 1 MG tablet Take 1 tablet (1 mg total) by mouth at bedtime. 30 tablet 0  .  divalproex (DEPAKOTE ER) 500 MG 24 hr tablet Take 2 tablets (1,000 mg total) by mouth at bedtime. (Patient taking differently: Take 500 mg by mouth at bedtime. Take one tablet by mouth in am and two tabs by mouth in pm) 60 tablet 0  . esomeprazole (NEXIUM) 40 MG capsule TAKE 1 CAPSULE BY MOUTH DAILY 30 capsule 0  . FLUoxetine (PROZAC) 20 MG capsule Take 40 mg by mouth daily.    Marland Kitchen levothyroxine (SYNTHROID, LEVOTHROID) 50 MCG tablet TAKE 1 TABLET BY MOUTH EVERY MORNING 30 tablet 0  . Multiple Vitamin (MULTIVITAMIN WITH MINERALS) TABS tablet Take 1 tablet by mouth daily.    Marland Kitchen oxyCODONE (OXY IR/ROXICODONE) 5 MG immediate release tablet Take 1 tablet (5 mg total) by  mouth every 6 (six) hours as needed for moderate pain, severe pain or breakthrough pain. 30 tablet 0  . pyridOXINE (VITAMIN B-6) 50 MG tablet Take 100 mg by mouth 2 (two) times daily.    . simvastatin (ZOCOR) 20 MG tablet TAKE 1 TABLET BY MOUTH EVERY NIGHT AT BEDTIME 30 tablet 0  . traZODone (DESYREL) 100 MG tablet Take 1 tablet (100 mg total) by mouth at bedtime. 30 tablet 0   No current facility-administered medications for this visit.    Facility-Administered Medications Ordered in Other Visits  Medication Dose Route Frequency Provider Last Rate Last Dose  . cyanocobalamin ((VITAMIN B-12)) injection 1,000 mcg  1,000 mcg Intramuscular Once Lequita Asal, MD        Review of Systems:  GENERAL:  Feels "ok".  No fevers or sweats.  Weight loss of 2 pounds since last visit. PERFORMANCE STATUS (ECOG):  1 HEENT:  Glaucoma.  No runny nose, sore throat, mouth sores or tenderness. Lungs: No shortness of breath or cough.  No hemoptysis. Cardiac:  No chest pain, palpitations, orthopnea, or PND. GI:  Reflux.  No nausea, vomiting, diarrhea, constipation, melena or hematochezia.  Colonoscopy at age 38 (declines). GU:  No urgency, frequency, dysuria, or hematuria. Musculoskeletal:  No back pain.  No joint pain.  No muscle tenderness. Extremities:  No pain or swelling. Skin:  Bruises easily.  No rashes or skin changes. Neuro:  Short term memory poor.  No headache, numbness or weakness, balance or coordination issues. Endocrine:  No diabetes, thyroid issues, hot flashes or night sweats. Psych:  Schizophrenia.  Bipolar.   Pain:  No focal pain. Review of systems:  All other systems reviewed and found to be negative.  Physical Exam: There were no vitals taken for this visit. GENERAL:  Well developed, well nourished, sitting comfortably in the exam room in no acute distress. Talkaltive. MENTAL STATUS:  Alert and oriented to person, place and time. HEAD:  Pearline Cables hair.  Male pattern baldness.   Normocephalic, atraumatic, face symmetric, no Cushingoid features. EYES:  Glasses.  Blue eyes.  No conjunctivitis or scleral icterus. ENT:  Oropharynx clear without lesion.  Tongue normal. Mucous membranes  RESPIRATORY:  Clear to auscultation without rales, wheezes or rhonchi. CARDIOVASCULAR:  Regular rate and rhythm without murmur, rub or gallop. ABDOMEN:  Soft, non-tender, with active bowel sounds, and no appreciable hepatosplenomegaly.  No masses. SKIN:  No rashes, ulcers or lesions. EXTREMITIES: Chronic lower extremity edema.  No  skin discoloration or tenderness.  No palpable cords. LYMPH NODES: No palpable cervical, supraclavicular, axillary or inguinal adenopathy  NEUROLOGICAL: Unremarkable. PSYCH:  Appropriate.   No visits with results within 3 Day(s) from this visit.  Latest known visit with results is:  Admission on  10/04/2016, Discharged on 10/22/2016  No results displayed because visit has over 200 results.      Assessment:  Brandon Maynard. is a 60 y.o. male with schizophrenia, bipolar disorder, and psychotic disorder with at least a 6 year history of anemia and progressive thrombocytopenia.  He has mild leukopenia with a mild monocytosis (10-11%).   He likely has multi-factorial anemia.  He has a distant history of iron deficiency anemia.  He had a colonoscopy 17 year ago (age 40).  Diet appears good (although he notes no fresh vegetables).  He denies any melena or hematochezia.  Ferritin was 82 (normal) on 05/12/2016.    He has chronic kidney disease (CKD) associated with a 30 year history of Lithium use (stoped in the 1990s).  CKD is contributing to his anemia.  Creatinine has been stable between 2.04-2.38 (CrCl 28-31 ml/min).   He is on Depakote which has a 1-27% incidence of thrombocytopenia (dose related).  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  Prozac and Trazodone have case reports of associated anemia.  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  Work-up on 06/16/2016 noted a hematocrit 34.6, hemoglobin 11.9, MCV 94.3, platelets 72,000, white count 4600 with an [place] of 2100. The following were normal:  ANA, hepatitis B surface antigen , hepatitis B core antibody total , hepatitis C antibody, HIV antibody, PTT, PT, iron studies (iron saturation 14% and TIBC of 383), ESR, folate, B12 (326; low normal). Reticulocyte count was 1.5%.  Additional labs on 07/06/2016 revealed a normal SPEP and free light chains.  Peripheral smear was normal.  Bone marrow biopsy on 08/06/2016 revealed a variably cellular marrow (20-50%) with relatively increased erythroid precursors, marginally increased monocytic cells, and mild trilineage dyspoiesis.  There were multiple small granulomas, non-specific.  GMS and AFB stains were negative.  There was slight patchy increase in reticulin.  Storage iron was present.  Flow cytometry was negative.  Cytogenetics were normal (46, XY).  Cytopenias were felt likely multi-factorial and due to chronic disease and drug/medication effect.  He has B12 deficiency.  MMA was 445 (high) on 07/06/2016.  He received B12 x 2 (07/12/2016 and 07/19/2016), but stopped secondary to a "reaction".  He is on oral B12 and tolerating well.  Abdominal ultrasound on 06/28/2016 revealed a spleen upper limits of normal (13.2 cm).  He has ongoing weight loss.  He has lost 23 pounds since 01/2016.  He has no nausea, vomiting or diarrhea.  TSH was normal on 05/12/2016.  Plan: 1.  Review PET scan. 2.  RTC in 2 months for MD assessment, labs (CBC with diff, ferritin, B12, folate), and CXR (PA and lateral) - same day   Lequita Asal, MD  11/04/2016, 4:41 AM

## 2016-11-05 ENCOUNTER — Inpatient Hospital Stay: Payer: Medicare Other

## 2016-11-05 ENCOUNTER — Inpatient Hospital Stay: Payer: Medicare Other | Admitting: Hematology and Oncology

## 2016-11-05 ENCOUNTER — Encounter (INDEPENDENT_AMBULATORY_CARE_PROVIDER_SITE_OTHER): Payer: Self-pay

## 2016-11-05 NOTE — Progress Notes (Deleted)
[facility] day:  11/05/16  Chief Complaint: Brandon Maynard. is a 60 y.o. male with anemia and thrombocytopenia who is seen for 2 month assessment.  HPI:   The patient was last seen in the hematology clinic on 09/02/2016/2017.  At that time, PET scan was reviewed.  PET scan on 09/01/2016 revealed no hypermetabolic source of primary malignancy.  There was mildly hypermetabolic patchy ground-glass in the inferior right lower lobe, likely infectious or inflammatory in etiology.   There was no nodular or masslike features.  He was admitted to [facility] from 10/04/2016 - 10/22/2016 with sepsis and cellulitis of the left foot.  Blood cultures on 10/04/2016 were positive for E coli.  He completed 12 days of Ancef with plan to transition to days days of Keflex.  Course was compliced by acute renal failure requiring dialysis from 10/09/2016 - 10/13/2016.  Creatinine was 3.64 on 10/22/2016.  He was transfused with 1 unit of PRBCs.   CBC on 10/20/2016 revealed a hematocrit of 20.5, hemoglobin 7.2, MCV 87.8, platelets 151,000 and WBC 7000.   Past Medical History:  Diagnosis Date  . Anemia   . Bipolar disorder ([place])   . Cancer ([place])   . Cataract    right eye  . DDD (degenerative disc disease)   . GERD (gastroesophageal reflux disease)   . Heart murmur   . Hyperlipidemia   . Hypertension   . Paranoid schizophrenia ([place])   . 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 Thyroid disease     Past Surgical History:  Procedure Laterality Date  . Dermal Abrasion  1973  . PERIPHERAL VASCULAR CATHETERIZATION N/A 10/14/2016   Procedure: Dialysis/Perma Catheter Insertion;  Surgeon: Algernon Huxley, MD;  Location: [facility];  Service: Cardiovascular;  Laterality: N/A;    Family History  Problem Relation Age of Onset  . Brain cancer Maternal Uncle   . Breast cancer Mother   . Melanoma Mother   . Congestive Heart Failure Father   . Melanoma Father   . Breast cancer  Maternal Aunt   . Diabetes Maternal Aunt     Social History:  reports that he has been smoking.  He has never used smokeless tobacco. He reports that he does not drink alcohol or use drugs.  He states that his mother died the week before Easter 2016-02-06).  The patient states that he took drugs when he was younger (age [age]).  He took LSD, speed, and Karle Starch (marijuana).  He never did IV drugs.  He previously drank alcohol.  He stopped drinking wine 30 years ago.  He lives in the [place].  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  Contact number [phone].  He is accompanied by [name] from the group home today.  Allergies:  Allergies  Allergen Reactions  . Tetracyclines & Related Hives    Current Medications: Current Outpatient Prescriptions  Medication Sig Dispense Refill  . asenapine (SAPHRIS) 5 MG SUBL 24 hr tablet Place 5-10 mg under the tongue 2 (two) times daily. Take 72m daily in morning and take 15mdaily at bedtime    . bimatoprost (LUMIGAN) 0.01 % SOLN Place 1 drop into both eyes at bedtime.    . brimonidine-timolol (COMBIGAN) 0.2-0.5 % ophthalmic solution Place 1 drop into both eyes every 12 (twelve) hours.    . clonazePAM (KLONOPIN) 1 MG tablet Take 1 tablet (1 mg total) by mouth at bedtime. 30 tablet 0  .  divalproex (DEPAKOTE ER) 500 MG 24 hr tablet Take 2 tablets (1,000 mg total) by mouth at bedtime. (Patient taking differently: Take 500 mg by mouth at bedtime. Take one tablet by mouth in am and two tabs by mouth in pm) 60 tablet 0  . esomeprazole (NEXIUM) 40 MG capsule TAKE 1 CAPSULE BY MOUTH DAILY 30 capsule 0  . FLUoxetine (PROZAC) 20 MG capsule Take 40 mg by mouth daily.    Marland Kitchen levothyroxine (SYNTHROID, LEVOTHROID) 50 MCG tablet TAKE 1 TABLET BY MOUTH EVERY MORNING 30 tablet 0  . Multiple Vitamin (MULTIVITAMIN WITH MINERALS) TABS tablet Take 1 tablet by mouth daily.    Marland Kitchen oxyCODONE (OXY IR/ROXICODONE) 5 MG immediate release tablet Take 1 tablet (5 mg total) by  mouth every 6 (six) hours as needed for moderate pain, severe pain or breakthrough pain. 30 tablet 0  . pyridOXINE (VITAMIN B-6) 50 MG tablet Take 100 mg by mouth 2 (two) times daily.    . simvastatin (ZOCOR) 20 MG tablet TAKE 1 TABLET BY MOUTH EVERY NIGHT AT BEDTIME 30 tablet 0  . traZODone (DESYREL) 100 MG tablet Take 1 tablet (100 mg total) by mouth at bedtime. 30 tablet 0   No current facility-administered medications for this visit.    Facility-Administered Medications Ordered in Other Visits  Medication Dose Route Frequency Provider Last Rate Last Dose  . cyanocobalamin ((VITAMIN B-12)) injection 1,000 mcg  1,000 mcg Intramuscular Once Lequita Asal, MD        Review of Systems:  GENERAL:  Feels "ok".  No fevers or sweats.  Weight loss of 2 pounds since last visit. PERFORMANCE STATUS (ECOG):  1 HEENT:  Glaucoma.  No runny nose, sore throat, mouth sores or tenderness. Lungs: No shortness of breath or cough.  No hemoptysis. Cardiac:  No chest pain, palpitations, orthopnea, or PND. GI:  Reflux.  No nausea, vomiting, diarrhea, constipation, melena or hematochezia.  Colonoscopy at age 38 (declines). GU:  No urgency, frequency, dysuria, or hematuria. Musculoskeletal:  No back pain.  No joint pain.  No muscle tenderness. Extremities:  No pain or swelling. Skin:  Bruises easily.  No rashes or skin changes. Neuro:  Short term memory poor.  No headache, numbness or weakness, balance or coordination issues. Endocrine:  No diabetes, thyroid issues, hot flashes or night sweats. Psych:  Schizophrenia.  Bipolar.   Pain:  No focal pain. Review of systems:  All other systems reviewed and found to be negative.  Physical Exam: There were no vitals taken for this visit. GENERAL:  Well developed, well nourished, sitting comfortably in the exam room in no acute distress. Talkaltive. MENTAL STATUS:  Alert and oriented to person, place and time. HEAD:  Pearline Cables hair.  Male pattern baldness.   Normocephalic, atraumatic, face symmetric, no Cushingoid features. EYES:  Glasses.  Blue eyes.  No conjunctivitis or scleral icterus. ENT:  Oropharynx clear without lesion.  Tongue normal. Mucous membranes  RESPIRATORY:  Clear to auscultation without rales, wheezes or rhonchi. CARDIOVASCULAR:  Regular rate and rhythm without murmur, rub or gallop. ABDOMEN:  Soft, non-tender, with active bowel sounds, and no appreciable hepatosplenomegaly.  No masses. SKIN:  No rashes, ulcers or lesions. EXTREMITIES: Chronic lower extremity edema.  No  skin discoloration or tenderness.  No palpable cords. LYMPH NODES: No palpable cervical, supraclavicular, axillary or inguinal adenopathy  NEUROLOGICAL: Unremarkable. PSYCH:  Appropriate.   No visits with results within 3 Day(s) from this visit.  Latest known visit with results is:  Admission on  10/04/2016, Discharged on 10/22/2016  No results displayed because visit has over 200 results.      Assessment:  Naod Sweetland. is a 60 y.o. male with schizophrenia, bipolar disorder, and psychotic disorder with at least a 6 year history of anemia and progressive thrombocytopenia.  He has mild leukopenia with a mild monocytosis (10-11%).   He likely has multi-factorial anemia.  He has a distant history of iron deficiency anemia.  He had a colonoscopy 17 year ago (age 43).  Diet appears good (although he notes no fresh vegetables).  He denies any melena or hematochezia.  Ferritin was 82 (normal) on 05/12/2016.    He has chronic kidney disease (CKD) associated with a 30 year history of Lithium use (stoped in the 1990s).  CKD is contributing to his anemia.  Creatinine has been stable between 2.04-2.38 (CrCl 28-31 ml/min).   He is on Depakote which has a 1-27% incidence of thrombocytopenia (dose related).  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  Prozac and Trazodone have case reports of associated anemia.  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  Work-up on 06/16/2016 noted a hematocrit 34.6, hemoglobin 11.9, MCV 94.3, platelets 72,000, white count 4600 with an [place] of 2100. The following were normal:  ANA, hepatitis B surface antigen , hepatitis B core antibody total , hepatitis C antibody, HIV antibody, PTT, PT, iron studies (iron saturation 14% and TIBC of 383), ESR, folate, B12 (326; low normal). Reticulocyte count was 1.5%.  Additional labs on 07/06/2016 revealed a normal SPEP and free light chains.  Peripheral smear was normal.  Bone marrow biopsy on 08/06/2016 revealed a variably cellular marrow (20-50%) with relatively increased erythroid precursors, marginally increased monocytic cells, and mild trilineage dyspoiesis.  There were multiple small granulomas, non-specific.  GMS and AFB stains were negative.  There was slight patchy increase in reticulin.  Storage iron was present.  Flow cytometry was negative.  Cytogenetics were normal (46, XY).  Cytopenias were felt likely multi-factorial and due to chronic disease and drug/medication effect.  He has B12 deficiency.  MMA was 445 (high) on 07/06/2016.  He received B12 x 2 (07/12/2016 and 07/19/2016), but stopped secondary to a "reaction".  He is on oral B12 and tolerating well.  Abdominal ultrasound on 06/28/2016 revealed a spleen upper limits of normal (13.2 cm).  He has ongoing weight loss.  He has lost 23 pounds since 01/2016.  He has no nausea, vomiting or diarrhea.  TSH was normal on 05/12/2016.  Plan: 1.  Review PET scan. 2.  RTC in 2 months for MD assessment, labs (CBC with diff, ferritin, B12, folate), and CXR (PA and lateral) - same day   Lequita Asal, MD  11/05/2016, 5:48 AM

## 2016-11-07 ENCOUNTER — Encounter: Payer: Self-pay | Admitting: Hematology and Oncology

## 2016-11-08 ENCOUNTER — Other Ambulatory Visit (INDEPENDENT_AMBULATORY_CARE_PROVIDER_SITE_OTHER): Payer: Self-pay | Admitting: Vascular Surgery

## 2016-11-10 ENCOUNTER — Other Ambulatory Visit: Payer: Self-pay

## 2016-11-11 ENCOUNTER — Encounter
Admission: RE | Disposition: A | Discharge status: Home / Self Care | Payer: Self-pay | Source: Ambulatory Visit | Attending: Vascular Surgery

## 2016-11-11 ENCOUNTER — Ambulatory Visit
Admission: RE | Admit: 2016-11-11 | Discharge: 2016-11-11 | Disposition: A | Discharge status: Home / Self Care | Payer: Medicare Other | Source: Ambulatory Visit | Attending: Vascular Surgery | Admitting: Vascular Surgery

## 2016-11-11 ENCOUNTER — Ambulatory Visit: Payer: Medicare Other | Admitting: Family Medicine

## 2016-11-11 DIAGNOSIS — D696 Thrombocytopenia, unspecified: Secondary | ICD-10-CM | POA: Insufficient documentation

## 2016-11-11 DIAGNOSIS — I129 Hypertensive chronic kidney disease with stage 1 through stage 4 chronic kidney disease, or unspecified chronic kidney disease: Secondary | ICD-10-CM | POA: Insufficient documentation

## 2016-11-11 DIAGNOSIS — Z808 Family history of malignant neoplasm of other organs or systems: Secondary | ICD-10-CM | POA: Insufficient documentation

## 2016-11-11 DIAGNOSIS — Z8249 Family history of ischemic heart disease and other diseases of the circulatory system: Secondary | ICD-10-CM | POA: Diagnosis not present

## 2016-11-11 DIAGNOSIS — F17200 Nicotine dependence, unspecified, uncomplicated: Secondary | ICD-10-CM | POA: Diagnosis not present

## 2016-11-11 DIAGNOSIS — N186 End stage renal disease: Secondary | ICD-10-CM | POA: Diagnosis not present

## 2016-11-11 DIAGNOSIS — E079 Disorder of thyroid, unspecified: Secondary | ICD-10-CM | POA: Insufficient documentation

## 2016-11-11 DIAGNOSIS — D649 Anemia, unspecified: Secondary | ICD-10-CM | POA: Insufficient documentation

## 2016-11-11 DIAGNOSIS — Z992 Dependence on renal dialysis: Secondary | ICD-10-CM | POA: Diagnosis not present

## 2016-11-11 DIAGNOSIS — Z833 Family history of diabetes mellitus: Secondary | ICD-10-CM | POA: Insufficient documentation

## 2016-11-11 DIAGNOSIS — N289 Disorder of kidney and ureter, unspecified: Secondary | ICD-10-CM

## 2016-11-11 DIAGNOSIS — Z881 Allergy status to other antibiotic agents status: Secondary | ICD-10-CM | POA: Diagnosis not present

## 2016-11-11 DIAGNOSIS — Z452 Encounter for adjustment and management of vascular access device: Secondary | ICD-10-CM | POA: Diagnosis present

## 2016-11-11 DIAGNOSIS — K219 Gastro-esophageal reflux disease without esophagitis: Secondary | ICD-10-CM | POA: Diagnosis not present

## 2016-11-11 DIAGNOSIS — Z859 Personal history of malignant neoplasm, unspecified: Secondary | ICD-10-CM | POA: Diagnosis not present

## 2016-11-11 DIAGNOSIS — N189 Chronic kidney disease, unspecified: Secondary | ICD-10-CM | POA: Insufficient documentation

## 2016-11-11 DIAGNOSIS — F200 Paranoid schizophrenia: Secondary | ICD-10-CM | POA: Diagnosis not present

## 2016-11-11 DIAGNOSIS — H269 Unspecified cataract: Secondary | ICD-10-CM | POA: Diagnosis not present

## 2016-11-11 DIAGNOSIS — R011 Cardiac murmur, unspecified: Secondary | ICD-10-CM | POA: Diagnosis not present

## 2016-11-11 DIAGNOSIS — Z803 Family history of malignant neoplasm of breast: Secondary | ICD-10-CM | POA: Diagnosis not present

## 2016-11-11 HISTORY — PX: PERIPHERAL VASCULAR CATHETERIZATION: SHX172C

## 2016-11-11 SURGERY — DIALYSIS/PERMA CATHETER REMOVAL
Anesthesia: Moderate Sedation

## 2016-11-11 MED ORDER — LIDOCAINE-EPINEPHRINE (PF) 1 %-1:200000 IJ SOLN
INTRAMUSCULAR | Status: DC | PRN
  Administered 2016-11-11: 10 mL

## 2016-11-11 SURGICAL SUPPLY — 3 items
FORCEPS HALSTEAD CVD 5IN STRL (INSTRUMENTS) ×3 IMPLANT
TOWEL OR 17X26 4PK STRL BLUE (TOWEL DISPOSABLE) ×2 IMPLANT
TRAY LACERAT/PLASTIC (MISCELLANEOUS) ×2 IMPLANT

## 2016-11-11 NOTE — Op Note (Signed)
Operative Note     Preoperative diagnosis:   1. Renal failure with improvement no longer needed dialysis  Postoperative diagnosis:  1. Same as above  Procedure:  Removal of right jugular Permcath  Surgeon:  Leotis Pain, MD  Anesthesia:  Local  EBL:  Minimal  Indication for the Procedure:  The patient had renal failure and had a permcath removed. His renal function has improved and he no longer needs dialysis.  This catheter can be removed.  Risks and benefits are discussed and informed consent is obtained.  Description of the Procedure:  The patient's right neck, chest and existing catheter were sterilely prepped and draped. The area around the catheter was anesthetized copiously with 1% lidocaine. The catheter was dissected out with curved hemostats until the cuff was freed from the surrounding fibrous sheath. The fiber sheath was transected, and the catheter was then removed in its entirety using gentle traction. Pressure was held and sterile dressings were placed. The patient tolerated the procedure well and was taken to the recovery room in stable condition.     Leotis Pain  11/11/2016, 11:43 AM This note was created with Dragon Medical transcription system. Any errors in dictation are purely unintentional.

## 2016-11-11 NOTE — Progress Notes (Signed)
Called patient's ride. States he is on the way. Patient has ambulated to bathroom and is waiting on ride.

## 2016-11-11 NOTE — H&P (Signed)
[facility] History & Physical Update  The patient was interviewed and re-examined.  The patient's previous History and Physical has been reviewed and is unchanged.  There is no change in the plan of care. We plan to proceed with the scheduled procedure.  Leotis Pain, MD  11/11/2016, 11:07 AM

## 2016-11-12 ENCOUNTER — Encounter: Payer: Self-pay | Admitting: Vascular Surgery

## 2016-11-16 ENCOUNTER — Other Ambulatory Visit: Payer: Self-pay | Admitting: Family Medicine

## 2016-11-16 DIAGNOSIS — E039 Hypothyroidism, unspecified: Secondary | ICD-10-CM

## 2016-11-16 DIAGNOSIS — E785 Hyperlipidemia, unspecified: Secondary | ICD-10-CM

## 2016-11-17 ENCOUNTER — Ambulatory Visit: Payer: Self-pay

## 2016-12-02 ENCOUNTER — Other Ambulatory Visit: Payer: Medicare Other

## 2016-12-02 ENCOUNTER — Ambulatory Visit: Payer: Medicare Other | Admitting: Hematology and Oncology

## 2016-12-23 ENCOUNTER — Other Ambulatory Visit: Payer: Self-pay | Admitting: Family Medicine

## 2017-01-06 NOTE — Clinical Social Work Note (Signed)
CSW received call from [facility] from [place] [phone] stating that [employer] was investigating the family care home that patient was living at prior to his 10/04/17 admission. Ms. Carylon Perches requested information regarding patient's admission and discharge.   Ms. Carylon Perches came to hospital and picked up the information and informed CSW that patient did not end up going back to the family care home and that his brother made other arrangements. Shela Leff MSW,LCSW [phone]

## 2017-01-09 IMAGING — PT NM PET TUM IMG INITIAL (PI) SKULL BASE T - THIGH
1 of 10 series · 1 of 25 positions shown · non-contrast
Comparison: None.

CLINICAL DATA: Initial treatment strategy for weight loss.

EXAM:
NUCLEAR MEDICINE PET SKULL BASE TO THIGH
TECHNIQUE: 12.9 mCi F-18 FDG was injected intravenously. Full-ring PET imaging
was performed from the skull base to thigh after the radiotracer. CT
data was obtained and used for attenuation correction and anatomic
localization.
FASTING BLOOD GLUCOSE:  Value: 81 mg/dl

[Series 3: ct wb 5.0 b30f · axial · 5.0mm · 0.98mm/px · 1 of 329 slices shown]
[im 329/329  brain]
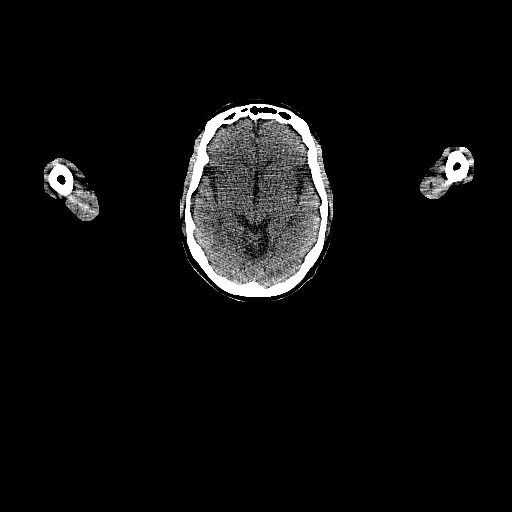

[1 of 25 positions shown; findings below may reference images not displayed]

FINDINGS: NECK

No hypermetabolic lymph nodes in the neck. CT images show no acute
findings.

CHEST

No hypermetabolic mediastinal, hilar or axillary lymph nodes. Mild
patchy ground-glass in the inferior right lower lobe is mildly
hypermetabolic. No hypermetabolic pulmonary nodules. No pericardial
or pleural effusion. Aortic atherosclerosis is mild. Coronary artery
calcification.

ABDOMEN/PELVIS

No abnormal hypermetabolism in the liver, adrenal glands, spleen or
pancreas. No hypermetabolic lymph nodes. Liver, gallbladder, adrenal
glands, kidneys, spleen, pancreas, stomach and bowel are grossly
unremarkable. Bladder wall appears thickened but the bladder is
somewhat low in volume.

SKELETON

No abnormal osseous hypermetabolism. Degenerative changes are seen
in the spine.
IMPRESSION: 1. No hypermetabolic source of primary malignancy.
2. Mildly hypermetabolic patchy ground-glass in the inferior right
lower lobe is likely infectious or inflammatory in etiology. No
nodular or masslike features.
3. Aortic atherosclerosis and coronary artery calcification.

## 2017-01-17 ENCOUNTER — Other Ambulatory Visit: Payer: Self-pay | Admitting: Family Medicine

## 2017-01-17 DIAGNOSIS — E785 Hyperlipidemia, unspecified: Secondary | ICD-10-CM

## 2017-01-17 DIAGNOSIS — E039 Hypothyroidism, unspecified: Secondary | ICD-10-CM

## 2017-03-03 ENCOUNTER — Encounter: Payer: Self-pay | Admitting: Emergency Medicine

## 2017-03-03 ENCOUNTER — Emergency Department
Admission: EM | Admit: 2017-03-03 | Discharge: 2017-03-03 | Disposition: A | Discharge status: Home / Self Care | Payer: Medicare Other | Attending: Emergency Medicine | Admitting: Emergency Medicine

## 2017-03-03 DIAGNOSIS — Z79899 Other long term (current) drug therapy: Secondary | ICD-10-CM | POA: Diagnosis not present

## 2017-03-03 DIAGNOSIS — F250 Schizoaffective disorder, bipolar type: Secondary | ICD-10-CM

## 2017-03-03 DIAGNOSIS — I10 Essential (primary) hypertension: Secondary | ICD-10-CM | POA: Insufficient documentation

## 2017-03-03 DIAGNOSIS — F17200 Nicotine dependence, unspecified, uncomplicated: Secondary | ICD-10-CM | POA: Insufficient documentation

## 2017-03-03 DIAGNOSIS — F918 Other conduct disorders: Secondary | ICD-10-CM | POA: Diagnosis not present

## 2017-03-03 DIAGNOSIS — R4689 Other symptoms and signs involving appearance and behavior: Secondary | ICD-10-CM

## 2017-03-03 DIAGNOSIS — F259 Schizoaffective disorder, unspecified: Secondary | ICD-10-CM | POA: Diagnosis present

## 2017-03-03 LAB — COMPREHENSIVE METABOLIC PANEL
ALK PHOS: 48 U/L (ref 38–126)
ALT: 9 U/L — ABNORMAL LOW (ref 17–63)
AST: 17 U/L (ref 15–41)
Albumin: 2.9 g/dL — ABNORMAL LOW (ref 3.5–5.0)
Anion gap: 6 (ref 5–15)
BILIRUBIN TOTAL: 0.4 mg/dL (ref 0.3–1.2)
BUN: 46 mg/dL — AB (ref 6–20)
CALCIUM: 8.9 mg/dL (ref 8.9–10.3)
CHLORIDE: 105 mmol/L (ref 101–111)
CO2: 22 mmol/L (ref 22–32)
CREATININE: 3.6 mg/dL — AB (ref 0.61–1.24)
GFR, EST AFRICAN AMERICAN: 19 mL/min — AB (ref >60)
GFR, EST NON AFRICAN AMERICAN: 17 mL/min — AB (ref >60)
Glucose, Bld: 90 mg/dL (ref 65–99)
Potassium: 5.2 mmol/L — ABNORMAL HIGH (ref 3.5–5.1)
Sodium: 133 mmol/L — ABNORMAL LOW (ref 135–145)
Total Protein: 9.2 g/dL — ABNORMAL HIGH (ref 6.5–8.1)

## 2017-03-03 LAB — CBC
HCT: 25.6 % — ABNORMAL LOW (ref 40.0–52.0)
HEMOGLOBIN: 8.6 g/dL — AB (ref 13.0–18.0)
MCH: 30.8 pg (ref 26.0–34.0)
MCHC: 33.5 g/dL (ref 32.0–36.0)
MCV: 91.9 fL (ref 80.0–100.0)
Platelets: 99 10*3/uL — ABNORMAL LOW (ref 150–440)
RBC: 2.79 MIL/uL — ABNORMAL LOW (ref 4.40–5.90)
RDW: 17.3 % — ABNORMAL HIGH (ref 11.5–14.5)
WBC: 4.5 10*3/uL (ref 3.8–10.6)

## 2017-03-03 LAB — ETHANOL

## 2017-03-03 LAB — ACETAMINOPHEN LEVEL: Acetaminophen (Tylenol), Serum: <10 ug/mL — ABNORMAL LOW (ref 10–30)

## 2017-03-03 LAB — SALICYLATE LEVEL

## 2017-03-03 NOTE — ED Notes (Signed)
Attempted to call [place] x3 without answer for discharge transport for patient.  Will attempt again shortly.

## 2017-03-03 NOTE — ED Notes (Signed)
Counselor had attempted to contact [facility] to coordinate transport @ [phone], No answer and not voicemail available to leave  message.

## 2017-03-03 NOTE — Discharge Instructions (Signed)
Please seek medical attention for any high fevers, chest pain, shortness of breath, change in behavior, persistent vomiting, bloody stool or any other new or concerning symptoms.  

## 2017-03-03 NOTE — ED Notes (Signed)
Pt given meal tray and juice. 
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
## 2017-03-03 NOTE — ED Notes (Signed)
Dr.Clapacs at bedside  
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
## 2017-03-03 NOTE — ED Notes (Signed)
EMS arrived to transport patient to [facility]  
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
## 2017-03-13 ENCOUNTER — Emergency Department
Admission: EM | Admit: 2017-03-13 | Discharge: 2017-03-14 | Disposition: A | Discharge status: Home / Self Care | Payer: Medicare Other | Attending: Emergency Medicine | Admitting: Emergency Medicine

## 2017-03-13 DIAGNOSIS — I10 Essential (primary) hypertension: Secondary | ICD-10-CM | POA: Diagnosis not present

## 2017-03-13 DIAGNOSIS — F17200 Nicotine dependence, unspecified, uncomplicated: Secondary | ICD-10-CM | POA: Diagnosis not present

## 2017-03-13 DIAGNOSIS — F918 Other conduct disorders: Secondary | ICD-10-CM | POA: Diagnosis present

## 2017-03-13 DIAGNOSIS — Z79899 Other long term (current) drug therapy: Secondary | ICD-10-CM | POA: Insufficient documentation

## 2017-03-13 DIAGNOSIS — F259 Schizoaffective disorder, unspecified: Secondary | ICD-10-CM | POA: Diagnosis not present

## 2017-03-13 DIAGNOSIS — R451 Restlessness and agitation: Secondary | ICD-10-CM

## 2017-03-13 DIAGNOSIS — F250 Schizoaffective disorder, bipolar type: Secondary | ICD-10-CM | POA: Diagnosis not present

## 2017-03-13 LAB — URINE DRUG SCREEN, QUALITATIVE (ARMC ONLY)
AMPHETAMINES, UR SCREEN: NOT DETECTED
Barbiturates, Ur Screen: NOT DETECTED
Benzodiazepine, Ur Scrn: NOT DETECTED
Cannabinoid 50 Ng, Ur ~~LOC~~: NOT DETECTED
Cocaine Metabolite,Ur ~~LOC~~: NOT DETECTED
MDMA (ECSTASY) UR SCREEN: NOT DETECTED
METHADONE SCREEN, URINE: NOT DETECTED
Opiate, Ur Screen: NOT DETECTED
PHENCYCLIDINE (PCP) UR S: NOT DETECTED
Tricyclic, Ur Screen: NOT DETECTED

## 2017-03-13 LAB — CBC WITH DIFFERENTIAL/PLATELET
BASOS ABS: 0 10*3/uL (ref 0–0.1)
BASOS PCT: 1 %
EOS PCT: 2 %
Eosinophils Absolute: 0.1 10*3/uL (ref 0–0.7)
HCT: 25.3 % — ABNORMAL LOW (ref 40.0–52.0)
Hemoglobin: 8.3 g/dL — ABNORMAL LOW (ref 13.0–18.0)
LYMPHS PCT: 27 %
Lymphs Abs: 1.1 10*3/uL (ref 1.0–3.6)
MCH: 30 pg (ref 26.0–34.0)
MCHC: 32.8 g/dL (ref 32.0–36.0)
MCV: 91.2 fL (ref 80.0–100.0)
Monocytes Absolute: 0.8 10*3/uL (ref 0.2–1.0)
Monocytes Relative: 19 %
Neutro Abs: 2 10*3/uL (ref 1.4–6.5)
Neutrophils Relative %: 51 %
PLATELETS: 81 10*3/uL — AB (ref 150–440)
RBC: 2.77 MIL/uL — AB (ref 4.40–5.90)
RDW: 17 % — AB (ref 11.5–14.5)
WBC: 4 10*3/uL (ref 3.8–10.6)

## 2017-03-13 LAB — COMPREHENSIVE METABOLIC PANEL
ALT: 9 U/L — ABNORMAL LOW (ref 17–63)
AST: 17 U/L (ref 15–41)
Albumin: 2.9 g/dL — ABNORMAL LOW (ref 3.5–5.0)
Alkaline Phosphatase: 45 U/L (ref 38–126)
Anion gap: 6 (ref 5–15)
BILIRUBIN TOTAL: 0.5 mg/dL (ref 0.3–1.2)
BUN: 46 mg/dL — ABNORMAL HIGH (ref 6–20)
CHLORIDE: 109 mmol/L (ref 101–111)
CO2: 22 mmol/L (ref 22–32)
CREATININE: 3.55 mg/dL — AB (ref 0.61–1.24)
Calcium: 8.9 mg/dL (ref 8.9–10.3)
GFR, EST AFRICAN AMERICAN: 20 mL/min — AB (ref >60)
GFR, EST NON AFRICAN AMERICAN: 17 mL/min — AB (ref >60)
Glucose, Bld: 100 mg/dL — ABNORMAL HIGH (ref 65–99)
POTASSIUM: 4.8 mmol/L (ref 3.5–5.1)
Sodium: 137 mmol/L (ref 135–145)
TOTAL PROTEIN: 8.9 g/dL — AB (ref 6.5–8.1)

## 2017-03-13 LAB — ETHANOL

## 2017-03-13 MED ORDER — ACETAMINOPHEN 325 MG PO TABS
ORAL_TABLET | ORAL | Status: AC
  Administered 2017-03-13: 15:00:00
  Filled 2017-03-13: qty 2

## 2017-03-13 MED ORDER — DIVALPROEX SODIUM ER 500 MG PO TB24
1000.0000 mg | ORAL_TABLET | Freq: Every day | ORAL | Status: DC
  Administered 2017-03-13: 1000 mg via ORAL
  Filled 2017-03-13: qty 2

## 2017-03-13 MED ORDER — LABETALOL HCL 100 MG PO TABS
100.0000 mg | ORAL_TABLET | Freq: Two times a day (BID) | ORAL | Status: DC
  Administered 2017-03-13: 100 mg via ORAL
  Filled 2017-03-13 (×4): qty 1

## 2017-03-13 MED ORDER — ASENAPINE MALEATE 5 MG SL SUBL
5.0000 mg | SUBLINGUAL_TABLET | SUBLINGUAL | Status: DC
  Administered 2017-03-14: 5 mg via SUBLINGUAL
  Filled 2017-03-13: qty 1

## 2017-03-13 MED ORDER — ASENAPINE MALEATE 5 MG SL SUBL
10.0000 mg | SUBLINGUAL_TABLET | Freq: Every day | SUBLINGUAL | Status: DC
  Administered 2017-03-13: 10 mg via SUBLINGUAL
  Filled 2017-03-13 (×2): qty 2

## 2017-03-13 MED ORDER — FLUOXETINE HCL 20 MG PO CAPS
40.0000 mg | ORAL_CAPSULE | Freq: Every day | ORAL | Status: DC
  Administered 2017-03-14: 40 mg via ORAL
  Filled 2017-03-13: qty 2

## 2017-03-13 MED ORDER — LORAZEPAM 1 MG PO TABS
1.0000 mg | ORAL_TABLET | Freq: Four times a day (QID) | ORAL | Status: DC | PRN
  Administered 2017-03-14: 1 mg via ORAL
  Filled 2017-03-13: qty 1

## 2017-03-13 MED ORDER — TRAZODONE HCL 100 MG PO TABS
100.0000 mg | ORAL_TABLET | Freq: Every day | ORAL | Status: DC
  Administered 2017-03-13: 100 mg via ORAL
  Filled 2017-03-13: qty 1

## 2017-03-13 MED ORDER — BRIMONIDINE TARTRATE 0.2 % OP SOLN
1.0000 [drp] | Freq: Two times a day (BID) | OPHTHALMIC | Status: DC
  Administered 2017-03-13 – 2017-03-14 (×2): 1 [drp] via OPHTHALMIC
  Filled 2017-03-13: qty 5

## 2017-03-13 MED ORDER — ACETAMINOPHEN 325 MG PO TABS
650.0000 mg | ORAL_TABLET | Freq: Four times a day (QID) | ORAL | Status: DC | PRN
  Administered 2017-03-14: 650 mg via ORAL
  Filled 2017-03-13: qty 2

## 2017-03-13 MED ORDER — LEVOTHYROXINE SODIUM 25 MCG PO TABS
50.0000 ug | ORAL_TABLET | Freq: Every day | ORAL | Status: DC
  Administered 2017-03-14: 50 ug via ORAL
  Filled 2017-03-13: qty 2

## 2017-03-13 MED ORDER — BRIMONIDINE TARTRATE-TIMOLOL 0.2-0.5 % OP SOLN: 1.0000 [drp] | Freq: Two times a day (BID) | OPHTHALMIC | Status: DC

## 2017-03-13 MED ORDER — CLONAZEPAM 1 MG PO TABS
1.0000 mg | ORAL_TABLET | Freq: Every day | ORAL | Status: DC
  Administered 2017-03-13: 1 mg via ORAL
  Filled 2017-03-13: qty 1

## 2017-03-13 MED ORDER — TIMOLOL MALEATE 0.5 % OP SOLN
1.0000 [drp] | Freq: Two times a day (BID) | OPHTHALMIC | Status: DC
  Administered 2017-03-13 – 2017-03-14 (×2): 1 [drp] via OPHTHALMIC
  Filled 2017-03-13: qty 5

## 2017-03-13 NOTE — ED Notes (Signed)
Pt to the bhu1

## 2017-03-13 NOTE — ED Notes (Signed)
Request to ED charge to inform EDP of need for meds.

## 2017-03-13 NOTE — ED Notes (Signed)
Report was received from Arnaldo Natal., RN; Pt. Verbalizes no complaints or distress; denies S.I./Hi. Continue to monitor with 15 min. Monitoring.
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
## 2017-03-13 NOTE — ED Notes (Signed)
ED Provider at bedside. 
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
## 2017-03-13 NOTE — ED Notes (Signed)
Observed responding to internal stimuli.  Non-aggressive.

## 2017-03-14 DIAGNOSIS — F259 Schizoaffective disorder, unspecified: Secondary | ICD-10-CM | POA: Diagnosis not present

## 2017-03-14 DIAGNOSIS — I10 Essential (primary) hypertension: Secondary | ICD-10-CM

## 2017-03-14 DIAGNOSIS — F250 Schizoaffective disorder, bipolar type: Secondary | ICD-10-CM | POA: Diagnosis not present

## 2017-03-14 NOTE — Consult Note (Signed)
[place] [facility] Consult   Reason for Consult:  Consult for 61 year old man with a history of schizoaffective disorder and multiple medical problems who was sent here allegedly because he's been agitated at his currentliving facility Referring Physician:  brown Patient Identification: Brandon Maynard. MRN:  [id number] Principal Diagnosis: Schizoaffective disorder [facility]) Diagnosis:   Patient Active Problem List   Diagnosis Date Noted  . Hypertension [I10] 03/14/2017  . Acute delirium [R41.0] 10/05/2016  . Left leg cellulitis [U88.280] 10/04/2016  . B12 deficiency [E53.8] 07/06/2016  . Anemia [D64.9] 06/16/2016  . Thrombocytopenia ([place]) [D69.6] 06/16/2016  . Weight loss [R63.4] 06/16/2016  . Esophageal reflux [K21.9] 05/12/2016  . 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 Schizoaffective disorder ([place]) [F25.9] 06/13/2014    Total Time spent with patient: 1 hour  Subjective:   Brandon Bankhead. is a 61 y.o. male patient admitted with "I just wanted a cigarette".  HPI:  Patient interviewed. Chart reviewed. Patient known from previous encounters. He 1-year-old man with chronic mental health issues was sent here from the rehabilitation facility where he is currently residing with complaints that he had been pounding on a glass door agitated and repeated claims that he had been aggressive or violent to staff or peers. The patient admits that he was pounding on the door. He says that he wanted to get outside and have a cigarette. He was frustrated that they were no longer allowing him to move about outdoors and in the grounds the way that he was use to. Apparently because of his recent agitated behavior they put some kind of monitoring device on his arm as well that raised in alarm when he tried to go out some of the doors and he was upset about this. Patient denies having any suicidal thoughts or homicidal thoughts. Denies  any worsening or change in his psychotic symptoms. Doesn't make any paranoid or delusional statements. Continues to have medical problems including recovery from his cellulitis chronic renal insufficiency edema in his legs. He has been compliant with outpatient medication. He totally denies that he was physically aggressive or even made contact with either other patients or staff on this occasion. It's possible they may be conflating that with the reasons they admitted him last time.  Social history: His closest relative is his brother who does not live in town. Patient has recently been residing at a rehabilitation center. He had previously been at group homes. He is wanting to be moved into some kind of higher level of care.  Medical history: Multiple medical problems including high blood pressure chronic renal insufficiency recovering cellulitis bilateral leg edema  Substance abuse history: Not abusing alcohol or drugs and does not have a significant past history of substance abuse  Past Psychiatric History: patient has a long histy of mental th problems wsis of schizoective disorer. Symptomf been largetable for a leriod of timehe occasionally gets a little agitaraises his vod has some trouble with impusivity. Therehad been aggressive with a pe it was probably any actual intent to harm anyone. Patient denies any history of suicide attempts. He has been stable on medication with which she is compliant.  Risk to Self: Suicidal Ideation: No Suicidal Intent: No Is patient at risk for suicide?: No Suicidal Plan?: No Access to Means: No What has been your use of drugs/alcohol within the last 12 months?: Reports of none Other Self Harm Risks: Reports of none Triggers for Past Attempts:  None known Intentional Self Injurious Behavior: None Risk to Others: Homicidal Ideation: No Thoughts of Harm to Others: No-Not Currently Present/Within Last 6 Months Current Homicidal Intent: No Current Homicidal  Plan: No Access to Homicidal Means: No Identified Victim: Reports of none History of harm to others?: Yes Assessment of Violence: In past 6-12 months Violent Behavior Description: Aggression at [place] Does patient have access to weapons?: No Criminal Charges Pending?: No Does patient have a court date: No Prior Inpatient Therapy: Prior Inpatient Therapy: Yes Prior Therapy Dates: Dates unknown Prior Therapy Facilty/Provider(s): [facility]" Reason for Treatment: Depression Prior Outpatient Therapy: Prior Outpatient Therapy: Yes Prior Therapy Dates: "When I had my first break." (Unable to remember date) Prior Therapy Facilty/Provider(s): Unable to remember names Reason for Treatment: "Schizophrenia & Bipolar" Does patient have an ACCT team?: No Does patient have Intensive In-House Services?  : No Does patient have [facility] services? : No Does patient have P4CC services?: No  Past Medical History:  Past Medical History:  Diagnosis Date  . Anemia   . Bipolar disorder ([place])   . Cancer ([place])   . Cataract    right eye  . DDD (degenerative disc disease)   . GERD (gastroesophageal reflux disease)   . Heart murmur   . Hyperlipidemia   . Hypertension   . Paranoid schizophrenia ([place])   . 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 Thyroid disease     Past Surgical History:  Procedure Laterality Date  . Dermal Abrasion  1973  . PERIPHERAL VASCULAR CATHETERIZATION N/A 10/14/2016   Procedure: Dialysis/Perma Catheter Insertion;  Surgeon: Algernon Huxley, MD;  Location: [facility];  Service: Cardiovascular;  Laterality: N/A;  . PERIPHERAL VASCULAR CATHETERIZATION N/A 11/11/2016   Procedure: Dialysis/Perma Catheter Removal;  Surgeon: Algernon Huxley, MD;  Location: [facility];  Service: Cardiovascular;  Laterality: N/A;   Family History:  Family History  Problem Relation Age of Onset  . Brain cancer Maternal Uncle   . Breast cancer Mother   . Melanoma Mother   . Congestive Heart  Failure Father   . Melanoma Father   . Breast cancer Maternal Aunt   . Diabetes Maternal Aunt    Family Psychiatric  History: none known Social History:  History  Alcohol Use No     History  Drug Use No    Social History   Social History  . Marital status: Single    Spouse name: N/A  . Number of children: N/A  . Years of education: N/A   Social History Main Topics  . Smoking status: Current Every Day Smoker    Last attempt to quit: 04/09/1991  . Smokeless tobacco: Never Used  . Alcohol use No  . Drug use: No  . Sexual activity: Not Asked   Other Topics Concern  . None   Social History Narrative  . None   Additional Social History:    Allergies:   Allergies  Allergen Reactions  . Tetracyclines & Related Hives    Labs:  Results for orders placed or performed during the hospital encounter of 03/13/17 (from the past 48 hour(s))  Comprehensive metabolic panel     Status: Abnormal   Collection Time: 03/13/17 12:44 PM  Result Value Ref Range   Sodium 137 135 - 145 mmol/L   Potassium 4.8 3.5 - 5.1 mmol/L   Chloride 109 101 - 111 mmol/L   CO2 22 22 - 32 mmol/L   Glucose, Bld 100 (H) 65 - 99 mg/dL  BUN 46 (H) 6 - 20 mg/dL   Creatinine, Ser 3.55 (H) 0.61 - 1.24 mg/dL   Calcium 8.9 8.9 - 10.3 mg/dL   Total Protein 8.9 (H) 6.5 - 8.1 g/dL   Albumin 2.9 (L) 3.5 - 5.0 g/dL   AST 17 15 - 41 U/L   ALT 9 (L) 17 - 63 U/L   Alkaline Phosphatase 45 38 - 126 U/L   Total Bilirubin 0.5 0.3 - 1.2 mg/dL   GFR calc non Af Amer 17 (L) >60 mL/min   GFR calc Af Amer 20 (L) >60 mL/min    Comment: (NOTE) The eGFR has been calculated using the CKD EPI equation. This calculation has not been validated in all clinical situations. eGFR's persistently <60 mL/min signify possible Chronic Kidney Disease.    Anion gap 6 5 - 15  Ethanol     Status: None   Collection Time: 03/13/17 12:44 PM  Result Value Ref Range   Alcohol, Ethyl (B) <5 <5 mg/dL    Comment:        LOWEST DETECTABLE  LIMIT FOR SERUM ALCOHOL IS 5 mg/dL FOR MEDICAL PURPOSES ONLY   CBC with Diff     Status: Abnormal   Collection Time: 03/13/17 12:44 PM  Result Value Ref Range   WBC 4.0 3.8 - 10.6 K/uL   RBC 2.77 (L) 4.40 - 5.90 MIL/uL   Hemoglobin 8.3 (L) 13.0 - 18.0 g/dL   HCT 25.3 (L) 40.0 - 52.0 %   MCV 91.2 80.0 - 100.0 fL   MCH 30.0 26.0 - 34.0 pg   MCHC 32.8 32.0 - 36.0 g/dL   RDW 17.0 (H) 11.5 - 14.5 %   Platelets 81 (L) 150 - 440 K/uL   Neutrophils Relative % 51 %   Neutro Abs 2.0 1.4 - 6.5 K/uL   Lymphocytes Relative 27 %   Lymphs Abs 1.1 1.0 - 3.6 K/uL   Monocytes Relative 19 %   Monocytes Absolute 0.8 0.2 - 1.0 K/uL   Eosinophils Relative 2 %   Eosinophils Absolute 0.1 0 - 0.7 K/uL   Basophils Relative 1 %   Basophils Absolute 0.0 0 - 0.1 K/uL  Urine Drug Screen, Qualitative ([facility] only)     Status: None   Collection Time: 03/13/17 12:44 PM  Result Value Ref Range   Tricyclic, Ur Screen NONE DETECTED NONE DETECTED   Amphetamines, Ur Screen NONE DETECTED NONE DETECTED   MDMA (Ecstasy)Ur Screen NONE DETECTED NONE DETECTED   Cocaine Metabolite,Ur [place] NONE DETECTED NONE DETECTED   Opiate, Ur Screen NONE DETECTED NONE DETECTED   Phencyclidine (PCP) Ur S NONE DETECTED NONE DETECTED   Cannabinoid 50 Ng, Ur [place] NONE DETECTED NONE DETECTED   Barbiturates, Ur Screen NONE DETECTED NONE DETECTED   Benzodiazepine, Ur Scrn NONE DETECTED NONE DETECTED   Methadone Scn, Ur NONE DETECTED NONE DETECTED    Comment: (NOTE) 865  Tricyclics, urine               Cutoff 1000 ng/mL 200  Amphetamines, urine             Cutoff 1000 ng/mL 300  MDMA (Ecstasy), urine           Cutoff 500 ng/mL 400  Cocaine Metabolite, urine       Cutoff 300 ng/mL 500  Opiate, urine                   Cutoff 300 ng/mL 600  Phencyclidine (PCP), urine  Cutoff 25 ng/mL 700  Cannabinoid, urine              Cutoff 50 ng/mL 800  Barbiturates, urine             Cutoff 200 ng/mL 900  Benzodiazepine, urine           Cutoff 200  ng/mL 1000 Methadone, urine                Cutoff 300 ng/mL 1100 1200 The urine drug screen provides only a preliminary, unconfirmed 1300 analytical test result and should not be used for non-medical 1400 purposes. Clinical consideration and professional judgment should 1500 be applied to any positive drug screen result due to possible 1600 interfering substances. A more specific alternate chemical method 1700 must be used in order to obtain a confirmed analytical result.  1800 Gas chromato graphy / mass spectrometry (GC/MS) is the preferred 1900 confirmatory method.     Current Facility-Administered Medications  Medication Dose Route Frequency Provider Last Rate Last Dose  . acetaminophen (TYLENOL) tablet 650 mg  650 mg Oral Q6H PRN Carrie Mew, MD   650 mg at 03/14/17 0817  . asenapine (SAPHRIS) sublingual tablet 10 mg  10 mg Sublingual QHS Merlyn Lot, MD   10 mg at 03/13/17 2151  . asenapine (SAPHRIS) sublingual tablet 5 mg  5 mg Sublingual BH-q7a Merlyn Lot, MD   5 mg at 03/14/17 0721  . brimonidine (ALPHAGAN) 0.2 % ophthalmic solution 1 drop  1 drop Both Eyes BID Merlyn Lot, MD   1 drop at 03/14/17 0918   And  . timolol (TIMOPTIC) 0.5 % ophthalmic solution 1 drop  1 drop Both Eyes BID Merlyn Lot, MD   1 drop at 03/14/17 [id number]  . clonazePAM (KLONOPIN) tablet 1 mg  1 mg Oral QHS Merlyn Lot, MD   1 mg at 03/13/17 2156  . divalproex (DEPAKOTE ER) 24 hr tablet 1,000 mg  1,000 mg Oral QHS Merlyn Lot, MD   1,000 mg at 03/13/17 2156  . FLUoxetine (PROZAC) capsule 40 mg  40 mg Oral Daily Merlyn Lot, MD   40 mg at 03/14/17 0918  . labetalol (NORMODYNE) tablet 100 mg  100 mg Oral BID Merlyn Lot, MD   Stopped at 03/14/17 1047  . levothyroxine (SYNTHROID, LEVOTHROID) tablet 50 mcg  50 mcg Oral QAC breakfast Merlyn Lot, MD   50 mcg at 03/14/17 0721  . LORazepam (ATIVAN) tablet 1 mg  1 mg Oral Q6H PRN Merlyn Lot, MD   1 mg at 03/14/17  0438  . traZODone (DESYREL) tablet 100 mg  100 mg Oral QHS Merlyn Lot, MD   100 mg at 03/13/17 2157   Current Outpatient Prescriptions  Medication Sig Dispense Refill  . asenapine (SAPHRIS) 5 MG SUBL 24 hr tablet Place 5 mg under the tongue every morning.     . Asenapine Maleate (SAPHRIS) 10 MG SUBL Place 10 mg under the tongue at bedtime.    . bimatoprost (LUMIGAN) 0.01 % SOLN Place 1 drop into both eyes at bedtime.    . brimonidine-timolol (COMBIGAN) 0.2-0.5 % ophthalmic solution Place 1 drop into both eyes 2 (two) times daily.     . clonazePAM (KLONOPIN) 1 MG tablet Take 1 tablet (1 mg total) by mouth at bedtime. 30 tablet 0  . divalproex (DEPAKOTE ER) 500 MG 24 hr tablet Take 2 tablets (1,000 mg total) by mouth at bedtime. (Patient taking differently: Take 57ms every morning and 10072m at bedtime) 60 tablet  0  . FLUoxetine (PROZAC) 40 MG capsule Take 40 mg by mouth daily.    Marland Kitchen labetalol (NORMODYNE) 100 MG tablet TAKE 1 TABLET BY MOUTH TWICE A DAY 60 tablet 0  . levothyroxine (SYNTHROID, LEVOTHROID) 50 MCG tablet TAKE 1 TABLET BY MOUTH EVERY MORNING 30 tablet 0  . Multiple Vitamins-Minerals (MULTIVITAMIN WITH MINERALS) tablet TAKE 1 TABLET BY MOUTH DAILY 30 tablet 0  . pyridOXINE (VITAMIN B-6) 50 MG tablet TAKE 2 TABLETS BY MOUTH TWICE A DAY 120 tablet 0  . simvastatin (ZOCOR) 20 MG tablet TAKE 1 TABLET BY MOUTH EVERY EVENING AT AT BEDTIME 30 tablet 0  . traZODone (DESYREL) 100 MG tablet Take 1 tablet (100 mg total) by mouth at bedtime. 30 tablet 0   Facility-Administered Medications Ordered in Other Encounters  Medication Dose Route Frequency Provider Last Rate Last Dose  . cyanocobalamin ((VITAMIN B-12)) injection 1,000 mcg  1,000 mcg Intramuscular Once Lequita Asal, MD        Musculoskeletal: Strength & Muscle Tone: within normal limits Gait & Station: normal Patient leans: N/A  Psychiatric Specialty Exam: Physical Exam  Nursing note and vitals  reviewed. Constitutional: He appears well-developed and well-nourished.  HENT:  Head: Normocephalic and atraumatic.  Eyes: Conjunctivae are normal. Pupils are equal, round, and reactive to light.  Neck: Normal range of motion.  Cardiovascular: Regular rhythm and normal heart sounds.   Respiratory: Effort normal. No respiratory distress.  GI: Soft.  Musculoskeletal: Normal range of motion.  Neurological: He is alert.  Skin: Skin is warm and dry.  Psychiatric: His speech is normal and behavior is normal. His affect is blunt. Thought content is not paranoid. He expresses impulsivity. He expresses no homicidal and no suicidal ideation. He exhibits abnormal recent memory.    Review of Systems  Constitutional: Negative.   HENT: Negative.   Eyes: Negative.   Respiratory: Negative.   Cardiovascular: Positive for leg swelling.  Gastrointestinal: Negative.   Musculoskeletal: Negative.   Skin: Negative.   Neurological: Negative.   Psychiatric/Behavioral: Negative for depression, hallucinations, memory loss, substance abuse and suicidal ideas. The patient is not nervous/anxious and does not have insomnia.     Blood pressure (!) 143/81, pulse (!) 50, temperature 98.4 F (36.9 C), temperature source Oral, resp. rate 16, height 6' 3"  (1.905 m), weight 84.4 kg (186 lb), SpO2 100 %.Body mass index is 23.25 kg/m.  General Appearance: Disheveled  Eye Contact:  Fair  Speech:  Normal Rate  Volume:  Normal  Mood:  Euthymic  Affect:  Constricted  Thought Process:  Goal Directed  Orientation:  Full (Time, Place, and Person)  Thought Content:  Logical  Suicidal Thoughts:  No  Homicidal Thoughts:  No  Memory:  Immediate;   Good Recent;   Fair Remote;   Fair  Judgement:  Fair  Insight:  Fair  Psychomotor Activity:  Decreased  Concentration:  Concentration: Fair  Recall:  [employer] of Knowledge:  Fair  Language:  Fair  Akathisia:  No  Handed:  Right  AIMS (if indicated):     Assets:  Desire  for Improvement Financial Resources/Insurance Housing Resilience Social Support  ADL's:  Intact  Cognition:  Impaired,  Mild  Sleep:        Treatment Plan Summary: Daily contact with patient to assess and evaluate symptoms and progress in treatment, Medication management and Plan 61 year old man with a history of chronic mental health problems. He has been in a new living situation recently and has had  some trouble making a transition. There is no indication however that he is actually dangerous to anyone or to himself. He is able to articulate a reasonable plan for controlling his behavior going ahead into the future. He is not delusional or reporting hallucinations or actively psychotic. Patient can be discharged from involuntary commitment as he no longer meets criteria and can be released back to his outpatient care with his current medication. Case reviewed with emergency room physician and TTS.  Disposition: Patient does not meet criteria for psychiatric inpatient admission. Supportive therapy provided about ongoing stressors.  Alethia Berthold, MD 03/14/2017 3:39 PM

## 2017-03-14 NOTE — ED Notes (Signed)
ED BHU [place] Is the patient under IVC or is there intent for IVC: Yes.   Is the patient medically cleared: Yes.   Is there vacancy in the ED BHU: Yes.   Is the population mix appropriate for patient: Yes.   Is the patient awaiting placement in inpatient or outpatient setting: Yes.   Has the patient had a psychiatric consult: Yes.   Survey of unit performed for contraband, proper placement and condition of furniture, tampering with fixtures in bathroom, shower, and each patient room: Yes.   APPEARANCE/BEHAVIOR cooperative and adequate rapport can be established NEURO ASSESSMENT Orientation: place and person Hallucinations: No.None noted (Hallucinations) Speech: Normal Gait: normal RESPIRATORY ASSESSMENT Normal expansion.  Clear to auscultation.  No rales, rhonchi, or wheezing. CARDIOVASCULAR ASSESSMENT regular rate and rhythm, S1, S2 normal, no murmur, click, rub or gallop GASTROINTESTINAL ASSESSMENT soft, nontender, BS WNL, no r/g EXTREMITIES normal strength, tone, and muscle mass PLAN OF CARE Provide calm/safe environment. Vital signs assessed twice daily. ED BHU Assessment once each 12-hour shift. Collaborate with intake RN daily or as condition indicates. Assure the ED provider has rounded once each shift. Provide and encourage hygiene. Provide redirection as needed. Assess for escalating behavior; address immediately and inform ED provider.  Assess family dynamic and appropriateness for visitation as needed: Yes.   Educate the patient/family about BHU procedures/visitation: Yes.

## 2017-03-14 NOTE — Discharge Instructions (Signed)
Return to the emergency department for any worsening condition including violent behavior, concern for danger to self or others.

## 2017-03-14 NOTE — ED Notes (Signed)
Patient received PM snack. 

## 2017-03-14 NOTE — ED Notes (Signed)
[place] to inform facility of pt's discharge, but no one answered phone. Will try again later.

## 2017-03-14 NOTE — ED Notes (Signed)
Pt discharged to cab sent by [facility]. Pt was stable and appreciative at that time. All papers were given and valuables returned. Verbal understanding expressed. Denies SI/HI and A/VH. Pt given opportunity to express concerns and ask questions.

## 2017-03-14 NOTE — ED Notes (Signed)
Breakfast brought to patient 

## 2017-03-14 NOTE — ED Provider Notes (Signed)
I spoke with Dr. Weber Cooks face-to-face, psychiatrist who recommends discharge patient back to home. IVC papers were rescinded by Dr. Weber Cooks. I completed the discharge paperwork.   Lisa Roca, MD 03/14/17 1259

## 2017-03-14 NOTE — ED Notes (Signed)
Pt awake and alert sitting in chair next to bed wrapped in a blanket. Pt reports being cold, but refused an extra blanket. Pt calm and cooperative and compliant with meds.

## 2017-03-14 NOTE — ED Notes (Signed)
Patient had a room change from B-1 to B-6; secondary to B-6 being warmer.

## 2017-03-14 NOTE — ED Provider Notes (Signed)
-----------------------------------------   6:45 AM on 03/14/2017 -----------------------------------------   Blood pressure 132/67, pulse (!) 54, temperature 98.4 F (36.9 C), temperature source Oral, resp. rate 16, height 6\' 3"  (1.905 m), weight 186 lb (84.4 kg), SpO2 100 %.  The patient had no acute events since last update.  Calm and cooperative at this time.  Disposition is pending Psychiatry/Behavioral Medicine team recommendations.     Paulette Blanch, MD 03/14/17 [phone]

## 2017-03-14 NOTE — ED Notes (Signed)
Was able to reach [facility]. Was told that they would call back soon.

## 2017-03-14 NOTE — ED Notes (Signed)
Nurse from [facility] reports to writer that they will be calling a cab for patient. [facility] to call when the cab is on the way

## 2017-06-14 ENCOUNTER — Emergency Department
Admission: EM | Admit: 2017-06-14 | Discharge: 2017-06-15 | Disposition: A | Discharge status: Other Acute Inpatient Hospital | Payer: Medicare Other | Attending: Emergency Medicine | Admitting: Emergency Medicine

## 2017-06-14 ENCOUNTER — Encounter: Payer: Self-pay | Admitting: Emergency Medicine

## 2017-06-14 DIAGNOSIS — R4689 Other symptoms and signs involving appearance and behavior: Secondary | ICD-10-CM

## 2017-06-14 DIAGNOSIS — F250 Schizoaffective disorder, bipolar type: Secondary | ICD-10-CM

## 2017-06-14 DIAGNOSIS — E079 Disorder of thyroid, unspecified: Secondary | ICD-10-CM | POA: Diagnosis not present

## 2017-06-14 DIAGNOSIS — R456 Violent behavior: Secondary | ICD-10-CM | POA: Diagnosis not present

## 2017-06-14 DIAGNOSIS — I10 Essential (primary) hypertension: Secondary | ICD-10-CM | POA: Diagnosis present

## 2017-06-14 DIAGNOSIS — R443 Hallucinations, unspecified: Secondary | ICD-10-CM | POA: Insufficient documentation

## 2017-06-14 DIAGNOSIS — N189 Chronic kidney disease, unspecified: Secondary | ICD-10-CM | POA: Diagnosis not present

## 2017-06-14 DIAGNOSIS — F17200 Nicotine dependence, unspecified, uncomplicated: Secondary | ICD-10-CM | POA: Diagnosis not present

## 2017-06-14 DIAGNOSIS — I129 Hypertensive chronic kidney disease with stage 1 through stage 4 chronic kidney disease, or unspecified chronic kidney disease: Secondary | ICD-10-CM | POA: Diagnosis not present

## 2017-06-14 DIAGNOSIS — F69 Unspecified disorder of adult personality and behavior: Secondary | ICD-10-CM | POA: Diagnosis present

## 2017-06-14 DIAGNOSIS — D649 Anemia, unspecified: Secondary | ICD-10-CM

## 2017-06-14 DIAGNOSIS — F259 Schizoaffective disorder, unspecified: Secondary | ICD-10-CM | POA: Diagnosis present

## 2017-06-14 LAB — COMPREHENSIVE METABOLIC PANEL
ALT: 13 U/L — ABNORMAL LOW (ref 17–63)
AST: 22 U/L (ref 15–41)
Albumin: 3.5 g/dL (ref 3.5–5.0)
Alkaline Phosphatase: 50 U/L (ref 38–126)
Anion gap: 5 (ref 5–15)
BUN: 43 mg/dL — ABNORMAL HIGH (ref 6–20)
CHLORIDE: 110 mmol/L (ref 101–111)
CO2: 23 mmol/L (ref 22–32)
Calcium: 9.7 mg/dL (ref 8.9–10.3)
Creatinine, Ser: 3.8 mg/dL — ABNORMAL HIGH (ref 0.61–1.24)
GFR, EST AFRICAN AMERICAN: 18 mL/min — AB (ref >60)
GFR, EST NON AFRICAN AMERICAN: 16 mL/min — AB (ref >60)
Glucose, Bld: 93 mg/dL (ref 65–99)
Potassium: 4.9 mmol/L (ref 3.5–5.1)
SODIUM: 138 mmol/L (ref 135–145)
Total Bilirubin: 0.7 mg/dL (ref 0.3–1.2)
Total Protein: 7.9 g/dL (ref 6.5–8.1)

## 2017-06-14 LAB — URINE DRUG SCREEN, QUALITATIVE (ARMC ONLY)
AMPHETAMINES, UR SCREEN: NOT DETECTED
Barbiturates, Ur Screen: NOT DETECTED
Benzodiazepine, Ur Scrn: NOT DETECTED
CANNABINOID 50 NG, UR ~~LOC~~: NOT DETECTED
Cocaine Metabolite,Ur ~~LOC~~: NOT DETECTED
MDMA (ECSTASY) UR SCREEN: NOT DETECTED
Methadone Scn, Ur: NOT DETECTED
Opiate, Ur Screen: NOT DETECTED
Phencyclidine (PCP) Ur S: NOT DETECTED
TRICYCLIC, UR SCREEN: NOT DETECTED

## 2017-06-14 LAB — ACETAMINOPHEN LEVEL

## 2017-06-14 LAB — CBC
HCT: 30.9 % — ABNORMAL LOW (ref 40.0–52.0)
HEMOGLOBIN: 10.5 g/dL — AB (ref 13.0–18.0)
MCH: 31.8 pg (ref 26.0–34.0)
MCHC: 34.1 g/dL (ref 32.0–36.0)
MCV: 93.1 fL (ref 80.0–100.0)
PLATELETS: 79 10*3/uL — AB (ref 150–440)
RBC: 3.32 MIL/uL — AB (ref 4.40–5.90)
RDW: 14.6 % — ABNORMAL HIGH (ref 11.5–14.5)
WBC: 4.5 10*3/uL (ref 3.8–10.6)

## 2017-06-14 LAB — SALICYLATE LEVEL

## 2017-06-14 LAB — ETHANOL

## 2017-06-14 MED ORDER — ASENAPINE MALEATE 5 MG SL SUBL
10.0000 mg | SUBLINGUAL_TABLET | Freq: Every day | SUBLINGUAL | Status: DC
  Filled 2017-06-14 (×2): qty 2

## 2017-06-14 MED ORDER — HALOPERIDOL LACTATE 5 MG/ML IJ SOLN
INTRAMUSCULAR | Status: AC
Start: 2017-06-14 — End: 2017-06-14
  Administered 2017-06-14: 10 mg via INTRAMUSCULAR
  Filled 2017-06-14: qty 2

## 2017-06-14 MED ORDER — FLUOXETINE HCL 20 MG PO CAPS: 40.0000 mg | ORAL_CAPSULE | Freq: Every day | ORAL | Status: DC

## 2017-06-14 MED ORDER — CLONAZEPAM 0.5 MG PO TABS
1.0000 mg | ORAL_TABLET | Freq: Every day | ORAL | Status: DC
Start: 2017-06-14 — End: 2017-06-15

## 2017-06-14 MED ORDER — HALOPERIDOL LACTATE 5 MG/ML IJ SOLN
10.0000 mg | Freq: Once | INTRAMUSCULAR | Status: AC
  Administered 2017-06-14: 10 mg via INTRAMUSCULAR

## 2017-06-14 MED ORDER — ASENAPINE MALEATE 5 MG SL SUBL
5.0000 mg | SUBLINGUAL_TABLET | Freq: Every day | SUBLINGUAL | Status: DC
  Filled 2017-06-14: qty 1

## 2017-06-14 MED ORDER — LABETALOL HCL 100 MG PO TABS
100.0000 mg | ORAL_TABLET | Freq: Every day | ORAL | Status: DC
  Filled 2017-06-14 (×3): qty 1

## 2017-06-14 MED ORDER — TRAZODONE HCL 100 MG PO TABS: 100.0000 mg | ORAL_TABLET | Freq: Every day | ORAL | Status: DC

## 2017-06-14 MED ORDER — DIVALPROEX SODIUM ER 250 MG PO TB24: 1000.0000 mg | ORAL_TABLET | Freq: Every day | ORAL | Status: DC

## 2017-06-14 MED ORDER — SIMVASTATIN 10 MG PO TABS: 20.0000 mg | ORAL_TABLET | Freq: Every day | ORAL | Status: DC

## 2017-06-14 MED ORDER — LEVOTHYROXINE SODIUM 50 MCG PO TABS: 50.0000 ug | ORAL_TABLET | Freq: Every day | ORAL | Status: DC
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
## 2017-06-14 NOTE — ED Notes (Signed)
Pt is sitting in bed with feet on floor and leaned on head of bed with eyes closed and covered in blanket

## 2017-06-14 NOTE — ED Notes (Signed)
Supper tray ordered

## 2017-06-14 NOTE — ED Notes (Signed)
PT IVC/ PENDING PLACEMENT  

## 2017-06-14 NOTE — ED Notes (Signed)
Attempted to move patient into room 21. Pt starts screaming "I'm going home, I [place]". Pt escorted into room 21 and given 10mg  IM haldol. Pt tolerated well and verbally deescalated. Pt eating sandwich tray.
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
## 2017-06-14 NOTE — BH Assessment (Signed)
Pt is a resident at [facility] [phone]).

## 2017-06-14 NOTE — BH Assessment (Addendum)
Pt referred to the following facilities:   [facility]- declined due to medical acuity

## 2017-06-14 NOTE — ED Notes (Signed)
Attempted to awaken pt for scheduled medications. Pt refusing to take meds at this time; states that he will "take them in the morning".
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
## 2017-06-14 NOTE — ED Notes (Signed)
Pt sitting on 20H bed and was given warm blanket. Tech Dianna gave pt a coke to drink
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
## 2017-06-15 NOTE — ED Notes (Signed)
Patient standing in doorway at this time. 

## 2017-06-15 NOTE — ED Notes (Signed)
Called to check on Sheriff's transport time, no time indicated by C com   [phone]

## 2017-06-15 NOTE — BH Assessment (Signed)
Patient has been accepted to [facility].  [facility] [place] [address]) Accepting physician is Dr. Elberta Leatherwood.  Call report to [phone].  Representative was [employer].  ER Staff is aware of it Kirby Crigler, ER Sect.; Dr. Reita Cliche, [place]  Patient's Nurse)    Writer updated patient's [place], [place] Gemma Years [phone])

## 2017-06-15 NOTE — ED Notes (Signed)
Pt on telephone with brother.

## 2017-06-15 NOTE — ED Notes (Signed)
Patient was given cup of water, and cup of cranberry  juice.

## 2017-06-15 NOTE — ED Provider Notes (Signed)
-----------------------------------------   4:35 PM on 06/15/2017 -----------------------------------------  I have seen the patient. Discussed his pending transfer to [facility]. Patient is agreeable with this plan. He has no complaints at this time. Jokes that he needs to shave. Appears well. Patient is stable for transfer at this time.   Harvest Dark, MD 06/15/17 [phone]

## 2017-06-15 NOTE — ED Notes (Signed)
Breakfast was given to patient , patient refused and he placed the tray in bed with him.

## 2017-06-15 NOTE — ED Notes (Signed)
This RN has reviewed the EMTALA form that has been completed by primary RN Katie.

## 2017-06-15 NOTE — ED Notes (Signed)
Patient sitting on side of bed.

## 2017-06-15 NOTE — ED Notes (Signed)
Patient refused shower will ask again.

## 2017-06-15 NOTE — ED Notes (Signed)
Patient in bathroom

## 2017-06-15 NOTE — ED Notes (Signed)
Sprite was given to patient

## 2017-06-15 NOTE — ED Provider Notes (Signed)
-----------------------------------------   7:43 AM on 06/15/2017 -----------------------------------------   Blood pressure 135/83, pulse 60, temperature 97.9 F (36.6 C), temperature source Oral, resp. rate 18, height 6\' 3"  (1.905 m), weight 90.7 kg (200 lb), SpO2 100 %.  The patient had no acute events since last update.  Calm and cooperative at this time. The patient has been referred to outpatient facilities and is awaiting acceptance for placement.     Loney Hering, MD 06/15/17 [phone]

## 2017-06-15 NOTE — ED Notes (Addendum)
Pt agitated, refusing EKG and medications. Screaming loudly.

## 2017-06-15 NOTE — ED Provider Notes (Signed)
Kerry Dory, social work, updated me that patient was admitted to [facility] with Dr. Earlie Server. I completed the EMTALA transfer paperwork.   Lisa Roca, MD 06/15/17 [phone]

## 2017-06-15 NOTE — ED Notes (Signed)
Called for Sheriff's transport [phone]

## 2017-06-15 NOTE — ED Notes (Signed)
Patient refused shower again

## 2018-04-10 ENCOUNTER — Other Ambulatory Visit: Payer: Self-pay

## 2020-04-10 ENCOUNTER — Other Ambulatory Visit: Payer: Self-pay

## 2020-04-10 ENCOUNTER — Emergency Department
Admission: EM | Admit: 2020-04-10 | Discharge: 2020-04-10 | Disposition: A | Discharge status: Home / Self Care | Payer: Medicare Other | Attending: Emergency Medicine | Admitting: Emergency Medicine

## 2020-04-10 ENCOUNTER — Emergency Department: Payer: Medicare Other

## 2020-04-10 DIAGNOSIS — Z79899 Other long term (current) drug therapy: Secondary | ICD-10-CM | POA: Diagnosis not present

## 2020-04-10 DIAGNOSIS — F17210 Nicotine dependence, cigarettes, uncomplicated: Secondary | ICD-10-CM | POA: Insufficient documentation

## 2020-04-10 DIAGNOSIS — R600 Localized edema: Secondary | ICD-10-CM

## 2020-04-10 DIAGNOSIS — I10 Essential (primary) hypertension: Secondary | ICD-10-CM | POA: Insufficient documentation

## 2020-04-10 DIAGNOSIS — M79605 Pain in left leg: Secondary | ICD-10-CM | POA: Diagnosis not present

## 2020-04-10 DIAGNOSIS — R2242 Localized swelling, mass and lump, left lower limb: Secondary | ICD-10-CM | POA: Insufficient documentation

## 2020-04-10 DIAGNOSIS — L03116 Cellulitis of left lower limb: Secondary | ICD-10-CM | POA: Insufficient documentation

## 2020-04-10 LAB — CBC WITH DIFFERENTIAL/PLATELET
Abs Immature Granulocytes: 0.03 10*3/uL (ref 0.00–0.07)
Basophils Absolute: 0.1 10*3/uL (ref 0.0–0.1)
Basophils Relative: 1 %
Eosinophils Absolute: 0.2 10*3/uL (ref 0.0–0.5)
Eosinophils Relative: 4 %
HCT: 30 % — ABNORMAL LOW (ref 39.0–52.0)
Hemoglobin: 10.3 g/dL — ABNORMAL LOW (ref 13.0–17.0)
Immature Granulocytes: 1 %
Lymphocytes Relative: 20 %
Lymphs Abs: 1.1 10*3/uL (ref 0.7–4.0)
MCH: 32.3 pg (ref 26.0–34.0)
MCHC: 34.3 g/dL (ref 30.0–36.0)
MCV: 94 fL (ref 80.0–100.0)
Monocytes Absolute: 0.4 10*3/uL (ref 0.1–1.0)
Monocytes Relative: 7 %
Neutro Abs: 3.8 10*3/uL (ref 1.7–7.7)
Neutrophils Relative %: 67 %
Platelets: 135 10*3/uL — ABNORMAL LOW (ref 150–400)
RBC: 3.19 MIL/uL — ABNORMAL LOW (ref 4.22–5.81)
RDW: 14 % (ref 11.5–15.5)
WBC: 5.7 10*3/uL (ref 4.0–10.5)
nRBC: 0 % (ref 0.0–0.2)

## 2020-04-10 LAB — COMPREHENSIVE METABOLIC PANEL
ALT: 27 U/L (ref 0–44)
AST: 30 U/L (ref 15–41)
Albumin: 3.9 g/dL (ref 3.5–5.0)
Alkaline Phosphatase: 77 U/L (ref 38–126)
Anion gap: 9 (ref 5–15)
BUN: 42 mg/dL — ABNORMAL HIGH (ref 8–23)
CO2: 24 mmol/L (ref 22–32)
Calcium: 9 mg/dL (ref 8.9–10.3)
Chloride: 107 mmol/L (ref 98–111)
Creatinine, Ser: 3.17 mg/dL — ABNORMAL HIGH (ref 0.61–1.24)
GFR calc Af Amer: 22 mL/min — ABNORMAL LOW (ref >60)
GFR calc non Af Amer: 19 mL/min — ABNORMAL LOW (ref >60)
Glucose, Bld: 108 mg/dL — ABNORMAL HIGH (ref 70–99)
Potassium: 4.5 mmol/L (ref 3.5–5.1)
Sodium: 140 mmol/L (ref 135–145)
Total Bilirubin: 0.6 mg/dL (ref 0.3–1.2)
Total Protein: 7.2 g/dL (ref 6.5–8.1)

## 2020-04-10 LAB — LACTIC ACID, PLASMA: Lactic Acid, Venous: 1.5 mmol/L (ref 0.5–1.9)

## 2020-04-10 MED ORDER — CEPHALEXIN 500 MG PO CAPS
500.0000 mg | ORAL_CAPSULE | Freq: Four times a day (QID) | ORAL | 0 refills | Status: AC
Start: 2020-04-10 — End: 2020-04-17

## 2020-04-10 NOTE — ED Notes (Signed)
Pt unaccompanied by any group home staff which he states he resides. States brother Delfino Lovett is his guardian, called for consent to treat, no answer, left HIPPA complaint message.

## 2020-04-10 NOTE — ED Notes (Signed)
Pt wandering around lobby inquiring about wait time. Pt by himself, no caregiver present. This RN called the caregiver listed as responsible for him and asked her to come be with patient seeing as he has legal guardian who has been contacted but is not here with patient. [phone] number called for [place]

## 2020-04-10 NOTE — ED Notes (Signed)
States ride is [name] [phone]

## 2020-04-10 NOTE — ED Notes (Signed)
Caregiver speaking with patient in regards to staying for treatment. Caregiver told this RN that she "was all the way across town" and "isn't coming in there because of COVID". Explained that pt is under her care and requested that she come as patient has legal guardian. States she is not coming. [phone]

## 2020-04-10 NOTE — ED Notes (Signed)
ED Provider at bedside. 

## 2020-04-10 NOTE — ED Notes (Signed)
Garnetta Enoch-McAdams contacted to pick up patient.

## 2020-04-10 NOTE — ED Notes (Signed)
Brother, Richard called back and gives consent for treatment.

## 2020-04-10 NOTE — ED Notes (Addendum)
Pt outside, this RN talked to patient and encouraged patient to come inside. Pt calling caregiver and reqeusting that she come be with him while he waits as well as requested by this RN.   [phone]

## 2020-04-10 NOTE — ED Triage Notes (Signed)
Reports left lower leg swelling and pain over the weekend. Left leg edematous, reddened greater than right leg.

## 2020-04-10 NOTE — Discharge Instructions (Signed)
There are no signs of a blood clot in the left leg.  We suspect that you have an infection in the skin and soft tissue.  Take the antibiotic as prescribed and finish the full 7-day course.  Keep the leg elevated.  Return to the ER for new, worsening, or persistent severe pain, redness or swelling going up the leg, fever chills, or any other new or worsening symptoms that concern you.

## 2020-04-10 NOTE — ED Notes (Signed)
AAOx3.  Anxious.  Patient discharged to care of group home driver.  D/c to group home.

## 2020-04-10 NOTE — ED Provider Notes (Signed)
[facility] Emergency Department Provider Note ____________________________________________   First MD Initiated Contact with Patient 04/10/20 1625     (approximate)  I have reviewed the triage vital signs and the nursing notes.   HISTORY  Chief Complaint Leg Swelling    HPI Brandon Maynard. is a 64 y.o. male with PMH as noted below who presents with left leg swelling for the last several days, gradual onset, associated with mild pain, not preceded by any injury other than a few small scrapes to his skin.  He denies prior history of similar swelling.  He denies any weakness or numbness.  He is able to walk and bear weight on the leg without difficulty.  Past Medical History:  Diagnosis Date  . Anemia   . Bipolar disorder ([place])   . Cancer ([place])   . Cataract    right eye  . DDD (degenerative disc disease)   . GERD (gastroesophageal reflux disease)   . Heart murmur   . Hyperlipidemia   . Hypertension   . Paranoid schizophrenia ([place])   . Psychotic disorder ([place]) 06/13/2014  . Thyroid disease     Patient Active Problem List   Diagnosis Date Noted  . Hypertension 03/14/2017  . Acute delirium 10/05/2016  . Left leg cellulitis 10/04/2016  . B12 deficiency 07/06/2016  . Anemia 06/16/2016  . Thrombocytopenia ([place]) 06/16/2016  . Weight loss 06/16/2016  . Esophageal reflux 05/12/2016  . DDD (degenerative disc disease), 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  . Psychotic disorder ([place]) 06/13/2014  . Schizoaffective disorder ([place]) 06/13/2014    Past Surgical History:  Procedure Laterality Date  . Dermal Abrasion  1973  . PERIPHERAL VASCULAR CATHETERIZATION N/A 10/14/2016   Procedure: Dialysis/Perma Catheter Insertion;  Surgeon: Algernon Huxley, MD;  Location: [facility];  Service: Cardiovascular;  Laterality: N/A;  . PERIPHERAL VASCULAR CATHETERIZATION N/A 11/11/2016   Procedure: Dialysis/Perma Catheter Removal;  Surgeon: Algernon Huxley, MD;  Location: [facility];  Service: Cardiovascular;  Laterality: N/A;    Prior to Admission medications   Medication Sig Start Date End Date Taking? Authorizing Provider  acetaminophen (TYLENOL) 325 MG tablet Take 650 mg by mouth every 6 (six) hours as needed.    [provider]  atorvastatin (LIPITOR) 10 MG tablet Take 10 mg by mouth daily.    [provider]  b complex vitamins tablet Take 1 tablet by mouth daily.    [provider]  bimatoprost (LUMIGAN) 0.01 % SOLN Place 1 drop into both eyes at bedtime. Patient not taking: Reported on 06/14/2017 06/27/14   Niel Hummer, NP  bisacodyl (DULCOLAX) 5 MG EC tablet Take 10 mg by mouth daily as needed for moderate constipation.    [provider]  brimonidine-timolol (COMBIGAN) 0.2-0.5 % ophthalmic solution Place 1 drop into both eyes 2 (two) times daily.     [provider]  cephALEXin (KEFLEX) 500 MG capsule Take 1 capsule (500 mg total) by mouth 4 (four) times daily for 7 days. 04/10/20 04/17/20  Arta Silence, MD  clonazePAM (KLONOPIN) 1 MG tablet Take 1 tablet (1 mg total) by mouth at bedtime. 10/22/16   Hower, Aaron Mose, MD  divalproex (DEPAKOTE ER) 250 MG 24 hr tablet Take 750 mg by mouth 2 (two) times daily.    [provider]  divalproex (DEPAKOTE ER) 500 MG 24 hr tablet Take 2 tablets (1,000 mg total) by mouth at bedtime. Patient not taking: Reported on 06/14/2017 06/27/14  Niel Hummer, NP  FLUoxetine (PROZAC) 10 MG capsule Take 30 mg by mouth daily.     [provider]  labetalol (NORMODYNE) 100 MG tablet TAKE 1 TABLET BY MOUTH TWICE A DAY Patient not taking: Reported on 06/14/2017 01/17/17   Juline Patch, MD  latanoprost (XALATAN) 0.005 % ophthalmic solution Place 1 drop into both eyes at bedtime.    [provider]  levothyroxine (SYNTHROID, LEVOTHROID) 50 MCG tablet TAKE 1 TABLET BY MOUTH EVERY MORNING 01/17/17   Juline Patch, MD  Multiple Vitamins-Minerals  (MULTIVITAMIN WITH MINERALS) tablet TAKE 1 TABLET BY MOUTH DAILY Patient not taking: Reported on 06/14/2017 01/17/17   Juline Patch, MD  pyridOXINE (VITAMIN B-6) 50 MG tablet TAKE 2 TABLETS BY MOUTH TWICE A DAY Patient not taking: Reported on 06/14/2017 12/24/16   Juline Patch, MD  simvastatin (ZOCOR) 20 MG tablet TAKE 1 TABLET BY MOUTH EVERY EVENING AT AT BEDTIME Patient not taking: Reported on 06/14/2017 01/17/17   Juline Patch, MD  traZODone (DESYREL) 100 MG tablet Take 1 tablet (100 mg total) by mouth at bedtime. 10/22/16   Hower, Aaron Mose, MD    Allergies Tetracyclines & related  Family History  Problem Relation Age of Onset  . Brain cancer Maternal Uncle   . Breast cancer Mother   . Melanoma Mother   . Congestive Heart Failure Father   . Melanoma Father   . Breast cancer Maternal Aunt   . Diabetes Maternal Aunt     Social History Social History   Tobacco Use  . Smoking status: Current Every Day Smoker    Last attempt to quit: 04/09/1991    Years since quitting: 29.0  . Smokeless tobacco: Never Used  Substance Use Topics  . Alcohol use: No    Alcohol/week: 0.0 standard drinks  . Drug use: No    Review of Systems  Constitutional: No fever/chills. Eyes: No visual changes. ENT: No sore throat. Cardiovascular: Denies chest pain. Respiratory: Denies shortness of breath. Gastrointestinal: No vomiting or diarrhea.  Genitourinary: Negative for dysuria.  Musculoskeletal: Negative for back pain.  Positive for left leg swelling. Skin: Negative for rash. Neurological: Negative for headaches, focal weakness or numbness.   ____________________________________________   PHYSICAL EXAM:  VITAL SIGNS: ED Triage Vitals  Enc Vitals Group     BP 04/10/20 1342 (!) 166/94     Pulse Rate 04/10/20 1342 94     Resp 04/10/20 1342 18     Temp 04/10/20 1342 98.6 F (37 C)     Temp Source 04/10/20 1342 Oral     SpO2 04/10/20 1342 94 %     Weight 04/10/20 1343 206 lb (93.4  kg)     Height 04/10/20 1343 6\' 3"  (1.905 m)     Head Circumference --      Peak Flow --      Pain Score 04/10/20 1343 0     Pain Loc --      Pain Edu? --      Excl. in [place]? --     Constitutional: Alert and oriented.  Relatively well appearing and in no acute distress. Eyes: Conjunctivae are normal.  Head: Atraumatic. Nose: No congestion/rhinnorhea. Mouth/Throat: Mucous membranes are moist.   Neck: Normal range of motion.   Cardiovascular: Normal rate, regular rhythm.   Good peripheral circulation. Respiratory: Normal respiratory effort.  No retractions.  Gastrointestinal: No distention.  Musculoskeletal: Extremities warm and well perfused.  Moderate edema, erythema, and slight warmth  to the distal left leg from just proximal to the ankle to almost the knee.  A few healing skin abrasions anteriorly.  No other open wounds or lesions.  Full range of motion at the knee and ankle.  No fluctuance.  No purulent drainage.  2+ DP pulse. Neurologic:  Normal speech and language. No gross focal neurologic deficits are appreciated.  Skin:  Skin is warm and dry. No rash noted. Psychiatric: Mood and affect are normal. Speech and behavior are normal.  ____________________________________________   LABS (all labs ordered are listed, but only abnormal results are displayed)  Labs Reviewed  COMPREHENSIVE METABOLIC PANEL - Abnormal; Notable for the following components:      Result Value   Glucose, Bld 108 (*)    BUN 42 (*)    Creatinine, Ser 3.17 (*)    GFR calc non Af Amer 19 (*)    GFR calc Af Amer 22 (*)    All other components within normal limits  CBC WITH DIFFERENTIAL/PLATELET - Abnormal; Notable for the following components:   RBC 3.19 (*)    Hemoglobin 10.3 (*)    HCT 30.0 (*)    Platelets 135 (*)    All other components within normal limits  LACTIC ACID, PLASMA    ____________________________________________  EKG   ____________________________________________  RADIOLOGY  US venous LLE: No acute DVT  ____________________________________________   PROCEDURES  Procedure(s) performed: No  Procedures  Critical Care performed: No ____________________________________________   INITIAL IMPRESSION / ASSESSMENT AND PLAN / ED COURSE  Pertinent labs & imaging results that were available during my care of the patient were reviewed by me and considered in my medical decision making (see chart for details).  64 year old male with PMH as noted above presents with left leg swelling for the last several days associated with pain but no fever or systemic symptoms.  He denies any trauma although he does have a few small, healing skin abrasions.  The patient has a psychiatric history and lives at a group home, however he is alert and oriented, able to answer all my questions appropriately, and participate in the history and physical.  On exam he is overall well-appearing.  Exam is only notable for swelling to the distal left leg from around the ankle area up about two thirds of the calf and anterior lower leg, with erythema and warmth.  There are no open wounds or lesions.  He has full range of motion of the joints and the lower extremity is neuro/vascular intact.  Differential includes cellulitis, DVT, peripheral edema or lymphedema.  Lab work-up is unremarkable for acute findings.  We will obtain an ultrasound of the left lower extremity for further evaluation.  ----------------------------------------- 5:53 PM on 04/10/2020 -----------------------------------------  Ultrasound shows no evidence of DVT.  The patient continues to appear comfortable and wants to go home.  He is stable for discharge.  I counseled him on the results of the work-up.  I will start Keflex for presumed cellulitis.  I gave him thorough return precautions, and he expressed  understanding.  I also provided them in written form for group home staff as well.  ____________________________________________   FINAL CLINICAL IMPRESSION(S) / ED DIAGNOSES  Final diagnoses:  Leg edema, left  Cellulitis of left lower extremity      NEW MEDICATIONS STARTED DURING THIS VISIT:  New Prescriptions   CEPHALEXIN (KEFLEX) 500 MG CAPSULE    Take 1 capsule (500 mg total) by mouth 4 (four) times daily for 7 days.  Note:  This document was prepared using Dragon voice recognition software and may include unintentional dictation errors.    Arta Silence, MD 04/10/20 1754

## 2020-11-18 ENCOUNTER — Ambulatory Visit: Payer: Medicare Other | Attending: Critical Care Medicine

## 2020-11-18 DIAGNOSIS — Z23 Encounter for immunization: Secondary | ICD-10-CM

## 2020-11-18 NOTE — Progress Notes (Signed)
   [id number]  Name:  Brandon Maynard.    MRN: [id number] DOB: Dec 31, 1955  11/18/2020  Brandon Maynard was observed post Covid-19 immunization for  15 min without incident. He was provided with Vaccine Information Sheet and instruction to access the V-Safe system.   Brandon Maynard was instructed to call 911 with any severe reactions post vaccine: Marland Kitchen Difficulty breathing  . Swelling of face and throat  . A fast heartbeat  . A bad rash all over body  . Dizziness and weakness   Immunizations Administered    Name Date Dose VIS Date Route   Moderna Covid-19 Booster Vaccine 11/18/2020 11:55 AM 0.25 mL 09/17/2020 Intramuscular   Manufacturer: Levan Hurst   Lot: [id number]   [place]: [id number]

## 2021-09-14 ENCOUNTER — Encounter: Payer: Self-pay | Admitting: Hematology and Oncology

## 2021-11-02 ENCOUNTER — Ambulatory Visit (INDEPENDENT_AMBULATORY_CARE_PROVIDER_SITE_OTHER): Payer: Medicare Other | Admitting: Podiatry

## 2021-11-02 ENCOUNTER — Encounter (INDEPENDENT_AMBULATORY_CARE_PROVIDER_SITE_OTHER): Payer: Self-pay

## 2021-11-02 ENCOUNTER — Encounter: Payer: Self-pay | Admitting: Podiatry

## 2021-11-02 ENCOUNTER — Other Ambulatory Visit: Payer: Self-pay

## 2021-11-02 DIAGNOSIS — B351 Tinea unguium: Secondary | ICD-10-CM | POA: Diagnosis not present

## 2021-11-02 DIAGNOSIS — M79674 Pain in right toe(s): Secondary | ICD-10-CM

## 2021-11-02 DIAGNOSIS — M79675 Pain in left toe(s): Secondary | ICD-10-CM

## 2021-11-02 DIAGNOSIS — D696 Thrombocytopenia, unspecified: Secondary | ICD-10-CM

## 2021-11-02 NOTE — Progress Notes (Signed)
This patient returns to my office for at risk foot care.  This patient requires this care by a professional since this patient will be at risk due to having thrombocytopenia.  This patient is unable to cut nails himself since the patient cannot reach his nails.These nails are painful walking and wearing shoes.  This patient presents for at risk foot care today.  General Appearance  Alert, conversant and in no acute stress.  Vascular  Dorsalis pedis and posterior tibial  pulses are palpable  bilaterally.  Capillary return is within normal limits  bilaterally. Temperature is within normal limits  bilaterally.Black discoloration feet and legs  B/l.  Neurologic  Senn-Weinstein monofilament wire test within normal limits  bilaterally. Muscle power within normal limits bilaterally.  Nails Thick disfigured discolored nails with subungual debris  from second  to fifth toes bilaterally. No evidence of bacterial infection or drainage bilaterally.  Orthopedic  No limitations of motion  feet .  No crepitus or effusions noted.  No bony pathology or digital deformities noted.  Skin  normotropic skin with no porokeratosis noted bilaterally.  No signs of infections or ulcers noted.     Onychomycosis  Pain in right toes  Pain in left toes  Consent was obtained for treatment procedures.   Mechanical debridement of nails 1-5  bilaterally performed with a nail nipper.  Filed with dremel without incident.    Return office visit      prn               Told patient to return for periodic foot care and evaluation due to potential at risk complications.   Gardiner Barefoot DPM

## 2022-09-28 ENCOUNTER — Encounter: Payer: Self-pay | Admitting: Hematology and Oncology

## 2022-09-30 ENCOUNTER — Ambulatory Visit (INDEPENDENT_AMBULATORY_CARE_PROVIDER_SITE_OTHER): Payer: Medicare Other | Admitting: Podiatry

## 2022-09-30 DIAGNOSIS — L309 Dermatitis, unspecified: Secondary | ICD-10-CM | POA: Diagnosis not present

## 2022-09-30 DIAGNOSIS — B999 Unspecified infectious disease: Secondary | ICD-10-CM | POA: Diagnosis not present

## 2022-09-30 MED ORDER — CLOTRIMAZOLE-BETAMETHASONE 1-0.05 % EX CREA: 1.0000 | TOPICAL_CREAM | Freq: Two times a day (BID) | CUTANEOUS | 0 refills | Status: DC

## 2022-09-30 MED ORDER — TERBINAFINE HCL 250 MG PO TABS: 250.0000 mg | ORAL_TABLET | Freq: Every day | ORAL | 0 refills | Status: DC

## 2022-09-30 MED ORDER — SULFAMETHOXAZOLE-TRIMETHOPRIM 800-160 MG PO TABS: 1.0000 | ORAL_TABLET | Freq: Two times a day (BID) | ORAL | 0 refills | Status: DC

## 2022-09-30 NOTE — Addendum Note (Signed)
Addended by: Boneta Lucks on: 09/30/2022 12:41 PM   Modules accepted: Level of Service

## 2022-09-30 NOTE — Progress Notes (Signed)
Subjective:  Patient ID: Brandon Maynard., male    DOB: 1956/01/30,  MRN: [id number]  No chief complaint on file.   66 y.o. male presents with the above complaint.  Patient presents with left lateral superficial infection/dermatitis.  Patient does not recall how long has been there but has been present for quite some time when to get it evaluated he has not seen anyone as prior to seeing me.  He has tried some topical medication none of which has helped.  He states that it was causing some itching before but is doing much better now in terms of itching.  There is no open wounds or lesion.  He does have some swelling in the left leg   Review of Systems: Negative except as noted in the HPI. Denies N/V/F/Ch.  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   Thyroid disease     Current Outpatient Medications:    clotrimazole-betamethasone (LOTRISONE) cream, Apply 1 Application topically 2 (two) times daily., Disp: 30 g, Rfl: 0   sulfamethoxazole-trimethoprim (BACTRIM DS) 800-160 MG tablet, Take 1 tablet by mouth 2 (two) times daily., Disp: 20 tablet, Rfl: 0   terbinafine (LAMISIL) 250 MG tablet, Take 1 tablet (250 mg total) by mouth daily., Disp: 90 tablet, Rfl: 0   acetaminophen (TYLENOL) 325 MG tablet, Take 650 mg by mouth every 6 (six) hours as needed., Disp: , Rfl:    atorvastatin (LIPITOR) 10 MG tablet, Take 10 mg by mouth daily., Disp: , Rfl:    b complex vitamins tablet, Take 1 tablet by mouth daily., Disp: , Rfl:    bimatoprost (LUMIGAN) 0.01 % SOLN, Place 1 drop into both eyes at bedtime. (Patient not taking: Reported on 06/14/2017), Disp: , Rfl:    bisacodyl (DULCOLAX) 5 MG EC tablet, Take 10 mg by mouth daily as  needed for moderate constipation., Disp: , Rfl:    brimonidine-timolol (COMBIGAN) 0.2-0.5 % ophthalmic solution, Place 1 drop into both eyes 2 (two) times daily. , Disp: , Rfl:    clonazePAM (KLONOPIN) 1 MG tablet, Take 1 tablet (1 mg total) by mouth at bedtime., Disp: 30 tablet, Rfl: 0   divalproex (DEPAKOTE ER) 250 MG 24 hr tablet, Take 750 mg by mouth 2 (two) times daily., Disp: , Rfl:    divalproex (DEPAKOTE ER) 500 MG 24 hr tablet, Take 2 tablets (1,000 mg total) by mouth at bedtime. (Patient not taking: Reported on 06/14/2017), Disp: 60 tablet, Rfl: 0   FLUoxetine (PROZAC) 10 MG capsule, Take 30 mg by mouth daily. , Disp: , Rfl:    labetalol (NORMODYNE) 100 MG tablet, TAKE 1 TABLET BY MOUTH TWICE A DAY (Patient not taking: Reported on 06/14/2017), Disp: 60 tablet, Rfl: 0   latanoprost (XALATAN) 0.005 % ophthalmic solution, Place 1 drop into both eyes at bedtime., Disp: , Rfl:    levothyroxine (SYNTHROID, LEVOTHROID) 50 MCG tablet, TAKE 1 TABLET BY MOUTH EVERY MORNING, Disp: 30 tablet, Rfl: 0   Multiple Vitamins-Minerals (MULTIVITAMIN WITH MINERALS) tablet, TAKE 1 TABLET BY MOUTH DAILY (Patient not taking: Reported on 06/14/2017), Disp: 30 tablet, Rfl: 0   pyridOXINE (VITAMIN B-6) 50 MG tablet,  TAKE 2 TABLETS BY MOUTH TWICE A DAY (Patient not taking: Reported on 06/14/2017), Disp: 120 tablet, Rfl: 0   simvastatin (ZOCOR) 20 MG tablet, TAKE 1 TABLET BY MOUTH EVERY EVENING AT AT BEDTIME (Patient not taking: Reported on 06/14/2017), Disp: 30 tablet, Rfl: 0   traZODone (DESYREL) 100 MG tablet, Take 1 tablet (100 mg total) by mouth at bedtime., Disp: 30 tablet, Rfl: 0 No current facility-administered medications for this visit.  Facility-Administered Medications Ordered in Other Visits:    cyanocobalamin ((VITAMIN B-12)) injection 1,000 mcg, 1,000 mcg, Intramuscular, Once, Lequita Asal, MD  Social History   Tobacco Use  Smoking Status Every Day   Packs/day: 0.25   Types: Cigarettes   Last  attempt to quit: 04/09/1991   Years since quitting: 31.4  Smokeless Tobacco Never    Allergies  Allergen Reactions   Tetracyclines & Related Hives   Objective:  There were no vitals filed for this visit. There is no height or weight on file to calculate BMI. Constitutional Well developed. Well nourished.  Vascular Dorsalis pedis pulses palpable bilaterally. Posterior tibial pulses palpable bilaterally. Capillary refill normal to all digits.  No cyanosis or clubbing noted. Pedal hair growth normal.  Neurologic Normal speech. Oriented to person, place, and time. Epicritic sensation to light touch grossly present bilaterally.  Dermatologic Left lateral foot dermatitis with skin superficial peeling.  No redness cellulitis noted.  Subjective component of some itching noted.  No open wounds or lesion noted wetness noted around the skin.  No maceration noted.  Orthopedic: Normal joint ROM without pain or crepitus bilaterally. No visible deformities. No bony tenderness.   Radiographs: None Assessment:   1. Dermatitis   2. Superinfection    Plan:  Patient was evaluated and treated and all questions answered.  Left lateral superinfection/atopic dermatitis of the foot -All questions and concerns were discussed with the patient in extensive detail.  Ultimately I believe patient would benefit from doxycycline to help with the infection as well as antifungal.  I also believe he will benefit from Lotrisone cream to be applied twice a day to the inflamed area.  I discussed with the patient and he states understanding.  He will do that. -Doxycycline Lamisil and Lotrisone were sent to the pharmacy and I instructed him on how to properly take it/use -He is a diabetic and a high risk of developing wound complication.  I discussed with the patient he states understanding.  I instructed him to control his sugars  No follow-ups on file.

## 2022-10-01 ENCOUNTER — Telehealth: Payer: Self-pay | Admitting: *Deleted

## 2022-10-01 MED ORDER — SULFAMETHOXAZOLE-TRIMETHOPRIM 800-160 MG PO TABS: 1.0000 | ORAL_TABLET | Freq: Two times a day (BID) | ORAL | 0 refills | Status: DC

## 2022-10-01 MED ORDER — TERBINAFINE HCL 250 MG PO TABS: 250.0000 mg | ORAL_TABLET | Freq: Every day | ORAL | 0 refills | Status: DC

## 2022-10-01 NOTE — Telephone Encounter (Signed)
Patient caregiver(Javon) is calling to ask that [facility]. ressent

## 2022-10-19 ENCOUNTER — Telehealth: Payer: Self-pay | Admitting: *Deleted

## 2022-10-19 ENCOUNTER — Ambulatory Visit: Payer: Medicare Other | Admitting: Podiatry

## 2022-10-19 NOTE — Telephone Encounter (Signed)
Patient's caretaker(Jovan) is cancelling patient's appointment for 10/20/20, not feeling well, has completed the antibiotic, continuing to take the terbinafine until next appointment.

## 2022-11-16 ENCOUNTER — Ambulatory Visit: Payer: Medicare Other | Admitting: Podiatry

## 2022-12-08 ENCOUNTER — Encounter: Payer: Self-pay | Admitting: Hematology and Oncology

## 2023-01-03 ENCOUNTER — Ambulatory Visit (INDEPENDENT_AMBULATORY_CARE_PROVIDER_SITE_OTHER): Payer: 59 | Admitting: Podiatry

## 2023-01-03 ENCOUNTER — Encounter: Payer: Self-pay | Admitting: Podiatry

## 2023-01-03 VITALS — BP 173/84 | HR 57

## 2023-01-03 DIAGNOSIS — L309 Dermatitis, unspecified: Secondary | ICD-10-CM | POA: Diagnosis not present

## 2023-01-03 DIAGNOSIS — B351 Tinea unguium: Secondary | ICD-10-CM | POA: Diagnosis not present

## 2023-01-03 DIAGNOSIS — M79675 Pain in left toe(s): Secondary | ICD-10-CM | POA: Diagnosis not present

## 2023-01-03 DIAGNOSIS — M79674 Pain in right toe(s): Secondary | ICD-10-CM

## 2023-01-03 MED ORDER — CLOTRIMAZOLE-BETAMETHASONE 1-0.05 % EX CREA: 1.0000 | TOPICAL_CREAM | Freq: Two times a day (BID) | CUTANEOUS | 4 refills | Status: DC

## 2023-01-03 NOTE — Patient Instructions (Signed)
Apply the Lotrisone cream twice daily to the outside of the left ankle. Use until the skin gets better. Refills are ordered if needed

## 2023-01-03 NOTE — Progress Notes (Signed)
  Subjective:  Patient ID: Brandon Oiler., male    DOB: 09-25-1956,  MRN: [id number]  Chief Complaint  Patient presents with   Tinea Pedis    "It cleared up then it came back."    67 y.o. male presents with the above complaint. History confirmed with patient. He saw Dr. Posey Pronto for the same issue in November, he took the oral antibiotic but did not receive the cream.  His nails are thickened elongated and causing pain as well  Objective:  Physical Exam: warm, good capillary refill, no trophic changes or ulcerative lesions, normal DP and PT pulses, normal sensory exam, and dermatitis left lateral ankle, venous insufficiency and stasis dermatitis noted. Left Foot: dystrophic yellowed discolored nail plates with subungual debris Right Foot: dystrophic yellowed discolored nail plates with subungual debris   Assessment:   1. Dermatitis   2. Pain due to onychomycosis of toenails of both feet      Plan:  Patient was evaluated and treated and all questions answered.  Discussed the etiology and treatment options for the condition in detail with the patient. Educated patient on the topical and oral treatment options for mycotic nails. Recommended debridement of the nails today. Sharp and mechanical debridement performed of all painful and mycotic nails today. Nails debrided in length and thickness using a nail nipper to level of comfort. Discussed treatment options including appropriate shoe gear. Follow up as needed for painful nails.  He has dermatitis and possible tinea pedis on the lateral ankle.  I recommended treating with Lotrisone cream twice daily.  Rx sent to pharmacy.  Foot and ankle hygiene reviewed and recommended follow-up if this does not improve or worsens  Return if symptoms worsen or fail to improve.

## 2023-12-15 ENCOUNTER — Encounter: Payer: Self-pay | Admitting: Hematology and Oncology

## 2023-12-30 ENCOUNTER — Inpatient Hospital Stay: Payer: 59 | Attending: Internal Medicine | Admitting: Internal Medicine

## 2023-12-30 ENCOUNTER — Encounter: Payer: Self-pay | Admitting: Internal Medicine

## 2023-12-30 ENCOUNTER — Inpatient Hospital Stay: Payer: 59

## 2023-12-30 VITALS — BP 135/69 | HR 60 | Temp 97.7°F | Resp 20 | Wt 206.0 lb

## 2023-12-30 DIAGNOSIS — Z79890 Hormone replacement therapy (postmenopausal): Secondary | ICD-10-CM | POA: Insufficient documentation

## 2023-12-30 DIAGNOSIS — N184 Chronic kidney disease, stage 4 (severe): Secondary | ICD-10-CM

## 2023-12-30 DIAGNOSIS — D649 Anemia, unspecified: Secondary | ICD-10-CM | POA: Diagnosis not present

## 2023-12-30 DIAGNOSIS — D696 Thrombocytopenia, unspecified: Secondary | ICD-10-CM | POA: Insufficient documentation

## 2023-12-30 DIAGNOSIS — E039 Hypothyroidism, unspecified: Secondary | ICD-10-CM | POA: Diagnosis not present

## 2023-12-30 DIAGNOSIS — Z87891 Personal history of nicotine dependence: Secondary | ICD-10-CM | POA: Insufficient documentation

## 2023-12-30 LAB — CBC WITH DIFFERENTIAL/PLATELET
Abs Immature Granulocytes: 0.03 10*3/uL (ref 0.00–0.07)
Basophils Absolute: 0 10*3/uL (ref 0.0–0.1)
Basophils Relative: 1 %
Eosinophils Absolute: 0.2 10*3/uL (ref 0.0–0.5)
Eosinophils Relative: 3 %
HCT: 28.5 % — ABNORMAL LOW (ref 39.0–52.0)
Hemoglobin: 9.5 g/dL — ABNORMAL LOW (ref 13.0–17.0)
Immature Granulocytes: 1 %
Lymphocytes Relative: 20 %
Lymphs Abs: 1 10*3/uL (ref 0.7–4.0)
MCH: 31.1 pg (ref 26.0–34.0)
MCHC: 33.3 g/dL (ref 30.0–36.0)
MCV: 93.4 fL (ref 80.0–100.0)
Monocytes Absolute: 0.6 10*3/uL (ref 0.1–1.0)
Monocytes Relative: 11 %
Neutro Abs: 3.4 10*3/uL (ref 1.7–7.7)
Neutrophils Relative %: 64 %
Platelets: 96 10*3/uL — ABNORMAL LOW (ref 150–400)
RBC: 3.05 MIL/uL — ABNORMAL LOW (ref 4.22–5.81)
RDW: 13.7 % (ref 11.5–15.5)
Smear Review: NORMAL
WBC: 5.2 10*3/uL (ref 4.0–10.5)
nRBC: 0 % (ref 0.0–0.2)

## 2023-12-30 LAB — HEPATIC FUNCTION PANEL
ALT: 14 U/L (ref 0–44)
AST: 20 U/L (ref 15–41)
Albumin: 3.7 g/dL (ref 3.5–5.0)
Alkaline Phosphatase: 68 U/L (ref 38–126)
Bilirubin, Direct: <0.1 mg/dL (ref 0.0–0.2)
Total Bilirubin: 0.6 mg/dL (ref 0.0–1.2)
Total Protein: 7.2 g/dL (ref 6.5–8.1)

## 2023-12-30 LAB — IRON AND TIBC
Iron: 46 ug/dL (ref 45–182)
Saturation Ratios: 15 % — ABNORMAL LOW (ref 17.9–39.5)
TIBC: 312 ug/dL (ref 250–450)
UIBC: 266 ug/dL

## 2023-12-30 LAB — FERRITIN: Ferritin: 107 ng/mL (ref 24–336)

## 2023-12-30 LAB — FOLATE: Folate: 18 ng/mL (ref >5.9)

## 2023-12-30 LAB — VITAMIN B12: Vitamin B-12: 358 pg/mL (ref 180–914)

## 2023-12-30 LAB — TSH: TSH: 2.383 u[IU]/mL (ref 0.350–4.500)

## 2023-12-30 NOTE — Progress Notes (Signed)
[facility]  [phone] [phone]  ID: Brandon Maynard. OB: August 24, 1956  MR#: [id number]  AOZ#:[id number]  Patient Care Team: Pcp, No as PCP - General Michaelyn Barter, MD as Consulting Physician (Oncology)  REFERRING PROVIDER: Franco Nones, NP  REASON FOR REFERRAL: thrombocytopenia  HPI: Brandon Maynard. is a 68 y.o. male with past medical history of CKD stage IV, hypertension, schizophrenia previously on lithium causing kidney injury, thrombocytopenia, anemia, hypothyroidism, GERD referred to hematology for evaluation of anemia and thrombocytopenia.  Patient has known anemia and thrombocytopenia at least since 2011.  Baseline hemoglobin around 10.  Platelets around 80-130.  Bone marrow biopsy on 08/10/2016 showed variably cellular marrow ranging 20 to 50% with increased erythroid precursors, marginally increased monocytic cells and mild multilineage dyspoiesis.  Multiple small epithelioid granulomas.  Nonspecific.  Findings are nondiagnostic.  Could be related to medication.  Low-grade MDS cannot be entirely excluded.  Karyotype showed normal male XY.  Evaluated by Dr. Merlene Pulling, hematology in September 2017.  At that time, patient was admitted with sepsis from left leg cellulitis/E. coli bacteremia/AKI.  His anemia was contributed to CKD.  Worsening thrombocytopenia likely from acute illness.  PET/CT was done with no lesions concerning for malignancy.  Denies alcohol use.  Denies any liver disease.  Patient lives in a group home.  His brother, [name] is guardian who assist with decision-making.  He was accompanied today with a caretaker from group home.  I tried to reach brother over the phone but went into voicemail.    REVIEW OF SYSTEMS:   ROS  As per HPI. Otherwise, a complete review of systems is negative.  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   Thyroid disease     PAST SURGICAL HISTORY: Past Surgical History:  Procedure Laterality Date   Dermal Abrasion  1973   PERIPHERAL VASCULAR CATHETERIZATION N/A 10/14/2016   Procedure: Dialysis/Perma Catheter Insertion;  Surgeon: Annice Needy, MD;  Location: [facility] INVASIVE CV LAB;  Service: Cardiovascular;  Laterality: N/A;   PERIPHERAL VASCULAR CATHETERIZATION N/A 11/11/2016   Procedure: Dialysis/Perma Catheter Removal;  Surgeon: Annice Needy, MD;  Location: [facility] INVASIVE CV LAB;  Service: Cardiovascular;  Laterality: N/A;    FAMILY HISTORY: Family History  Problem Relation Age of Onset   Brain cancer Maternal Uncle    Breast cancer Mother    Melanoma Mother    Congestive Heart Failure Father    Melanoma Father    Breast cancer Maternal Aunt    Diabetes Maternal Aunt     HEALTH MAINTENANCE: Social History   Tobacco Use   Smoking status: Former    Current packs/day: 0.00    Types: Cigarettes    Quit date: 04/09/1991    Years since quitting: 32.7   Smokeless tobacco: Never  Substance Use Topics   Alcohol use: No    Alcohol/week: 0.0 standard drinks of alcohol   Drug use: No     Allergies  Allergen Reactions   Tetracyclines & Related Hives    Current Outpatient Medications  Medication Sig Dispense Refill   acetaminophen (TYLENOL) 325 MG  tablet Take 650 mg by mouth every 6 (six) hours as needed.     amLODipine (NORVASC) 10 MG tablet Take 10 mg by mouth daily.     atorvastatin (LIPITOR) 10 MG tablet Take 10 mg by mouth daily.     b complex vitamins tablet Take 1 tablet by mouth daily.     bimatoprost (LUMIGAN) 0.01 % SOLN Place 1 drop into both eyes at bedtime.     bisacodyl (DULCOLAX) 5 MG EC tablet Take 10 mg by mouth daily as needed for moderate  constipation.     brimonidine-timolol (COMBIGAN) 0.2-0.5 % ophthalmic solution Place 1 drop into both eyes 2 (two) times daily.      clonazePAM (KLONOPIN) 1 MG tablet Take 1 tablet (1 mg total) by mouth at bedtime. 30 tablet 0   clotrimazole-betamethasone (LOTRISONE) cream Apply 1 Application topically 2 (two) times daily. 30 g 4   divalproex (DEPAKOTE ER) 250 MG 24 hr tablet Take 750 mg by mouth 2 (two) times daily.     divalproex (DEPAKOTE ER) 500 MG 24 hr tablet Take 2 tablets (1,000 mg total) by mouth at bedtime. 60 tablet 0   FLUoxetine (PROZAC) 10 MG capsule Take 30 mg by mouth daily.      labetalol (NORMODYNE) 100 MG tablet TAKE 1 TABLET BY MOUTH TWICE A DAY 60 tablet 0   latanoprost (XALATAN) 0.005 % ophthalmic solution Place 1 drop into both eyes at bedtime.     levothyroxine (SYNTHROID, LEVOTHROID) 50 MCG tablet TAKE 1 TABLET BY MOUTH EVERY MORNING 30 tablet 0   Multiple Vitamins-Minerals (MULTIVITAMIN WITH MINERALS) tablet TAKE 1 TABLET BY MOUTH DAILY 30 tablet 0   pyridOXINE (VITAMIN B-6) 50 MG tablet TAKE 2 TABLETS BY MOUTH TWICE A DAY 120 tablet 0   simvastatin (ZOCOR) 20 MG tablet TAKE 1 TABLET BY MOUTH EVERY EVENING AT AT BEDTIME 30 tablet 0   sulfamethoxazole-trimethoprim (BACTRIM DS) 800-160 MG tablet Take 1 tablet by mouth 2 (two) times daily. 20 tablet 0   terbinafine (LAMISIL) 250 MG tablet Take 1 tablet (250 mg total) by mouth daily. 90 tablet 0   traZODone (DESYREL) 100 MG tablet Take 1 tablet (100 mg total) by mouth at bedtime. 30 tablet 0   No current facility-administered medications for this visit.   Facility-Administered Medications Ordered in Other Visits  Medication Dose Route Frequency Provider Last Rate Last Admin   cyanocobalamin ((VITAMIN B-12)) injection 1,000 mcg  1,000 mcg Intramuscular Once Rosey Bath, MD        OBJECTIVE: There were no vitals filed for this visit.   There is no height or weight on file to calculate BMI.      General:  Well-developed, well-nourished, no acute distress. Eyes: Pink conjunctiva, anicteric sclera. HEENT: Normocephalic, moist mucous membranes, clear oropharnyx. Lungs: Clear to auscultation bilaterally. Heart: Regular rate and rhythm. No rubs, murmurs, or gallops. Abdomen: Soft, nontender, nondistended. No organomegaly noted, normoactive bowel sounds. Musculoskeletal: No edema, cyanosis, or clubbing. Neuro: Alert, answering all questions appropriately. Cranial nerves grossly intact. Skin: No rashes or petechiae noted. Psych: Normal affect. Lymphatics: No cervical, calvicular, axillary or inguinal LAD.   LAB RESULTS:  Lab Results  Component Value Date   NA 140 04/10/2020   K 4.5 04/10/2020   CL 107 04/10/2020   CO2 24 04/10/2020   GLUCOSE 108 (H) 04/10/2020   BUN 42 (H) 04/10/2020   CREATININE 3.17 (H) 04/10/2020   CALCIUM 9.0 04/10/2020   PROT 7.2 04/10/2020   ALBUMIN  3.9 04/10/2020   AST 30 04/10/2020   ALT 27 04/10/2020   ALKPHOS 77 04/10/2020   BILITOT 0.6 04/10/2020   GFRNONAA 19 (L) 04/10/2020   GFRAA 22 (L) 04/10/2020    Lab Results  Component Value Date   WBC 5.7 04/10/2020   NEUTROABS 3.8 04/10/2020   HGB 10.3 (L) 04/10/2020   HCT 30.0 (L) 04/10/2020   MCV 94.0 04/10/2020   PLT 135 (L) 04/10/2020    Lab Results  Component Value Date   TIBC 197 (L) 10/21/2016   TIBC 383 06/16/2016   FERRITIN 359 (H) 10/21/2016   FERRITIN 71 08/03/2016   FERRITIN 82 05/12/2016   IRONPCTSAT 10 (L) 10/21/2016   IRONPCTSAT 14 (L) 06/16/2016     STUDIES: No results found.  ASSESSMENT AND PLAN:   Leslie Langille. is a 68 y.o. male with pmh of CKD stage IV, hypertension, schizophrenia previously on lithium causing kidney injury, thrombocytopenia, anemia, hypothyroidism, GERD referred to hematology for evaluation of anemia and thrombocytopenia.  # Macrocytic anemia # Thrombocytopenia - Patient has known anemia and thrombocytopenia at least since 2011.  Baseline hemoglobin  around 10.  Platelets around 80-130.  - Bone marrow biopsy on 08/10/2016 showed variably cellular marrow ranging 20 to 50% with increased erythroid precursors, marginally increased monocytic cells and mild multilineage dyspoiesis.  Multiple small epithelioid granulomas.  Nonspecific.  Findings are nondiagnostic.  Could be related to medication.  Low-grade MDS cannot be entirely excluded.  Karyotype showed normal male XY. Evaluated by Dr. Merlene Pulling, hematology in September 2017.  At that time, patient was admitted with sepsis from left leg cellulitis/E. coli bacteremia/AKI.  His anemia was contributed to CKD.  Worsening thrombocytopenia likely from acute illness.  PET/CT was done with no lesions concerning for malignancy.  -On reviewing the lab trend, his anemia and thrombocytopenia is overall stable.  The new finding in the blood work that I see is macrocytosis on past 2 lab.  Hepatitis B/C/HIV negative.  SPEP showed abnormal band in gamma region.  Unable to find immunofixation.  Kappa 11.89, lambda 1.8 with normal ratio.  I will obtain labs as below to rule out any nutritional deficiency, autoimmune condition, repeat SPEP/IFE, LFTs.  For anemia we will recheck iron panel and EPO level.  He has CKD stage IV which could be contributing to anemia.  If workup is unrevealing, with worsening macrocytosis may need bone marrow biopsy to rule out MDS.  # CKD stage IV -Likely secondary to prior lithium use/hypertension.  Follows with [facility] nephrology.  Orders Placed This Encounter  Procedures   Vitamin B12   Folate   TSH   Multiple Myeloma Panel (SPEP&IFE w/QIG)   Kappa/lambda light chains   ANA w/Reflex   Iron and TIBC   Ferritin   Copper, serum   CBC with Differential/Platelet   Erythropoietin   Hepatic function panel   RTC in 3 weeks for MD visit to discuss labs.  Patient expressed understanding and was in agreement with this plan. He also understands that He can call clinic at any time with any  questions, concerns, or complaints.   I spent a total of 45 minutes reviewing chart data, face-to-face evaluation with the patient, counseling and coordination of care as detailed above.  Michaelyn Barter, MD   12/30/2023 11:32 AM

## 2023-12-31 LAB — ANA W/REFLEX: Anti Nuclear Antibody (ANA): NEGATIVE

## 2023-12-31 LAB — ERYTHROPOIETIN: Erythropoietin: 10.2 m[IU]/mL (ref 2.6–18.5)

## 2024-01-01 LAB — KAPPA/LAMBDA LIGHT CHAINS
Kappa free light chain: 100.4 mg/L — ABNORMAL HIGH (ref 3.3–19.4)
Kappa, lambda light chain ratio: 1.49 (ref 0.26–1.65)
Lambda free light chains: 67.6 mg/L — ABNORMAL HIGH (ref 5.7–26.3)

## 2024-01-01 LAB — COPPER, SERUM: Copper: 117 ug/dL (ref 69–132)

## 2024-01-03 LAB — MULTIPLE MYELOMA PANEL, SERUM
Albumin SerPl Elph-Mcnc: 3.6 g/dL (ref 2.9–4.4)
Albumin/Glob SerPl: 1.3 (ref 0.7–1.7)
Alpha 1: 0.2 g/dL (ref 0.0–0.4)
Alpha2 Glob SerPl Elph-Mcnc: 0.6 g/dL (ref 0.4–1.0)
B-Globulin SerPl Elph-Mcnc: 0.9 g/dL (ref 0.7–1.3)
Gamma Glob SerPl Elph-Mcnc: 1 g/dL (ref 0.4–1.8)
Globulin, Total: 2.8 g/dL (ref 2.2–3.9)
IgA: 309 mg/dL (ref 61–437)
IgG (Immunoglobin G), Serum: 1185 mg/dL (ref 603–1613)
IgM (Immunoglobulin M), Srm: 85 mg/dL (ref 20–172)
Total Protein ELP: 6.4 g/dL (ref 6.0–8.5)

## 2024-01-18 ENCOUNTER — Ambulatory Visit: Payer: 59 | Admitting: Podiatry

## 2024-01-20 ENCOUNTER — Inpatient Hospital Stay: Payer: 59 | Attending: Internal Medicine | Admitting: Internal Medicine

## 2024-01-20 ENCOUNTER — Encounter: Payer: Self-pay | Admitting: Internal Medicine

## 2024-01-20 VITALS — BP 158/60 | HR 66 | Temp 98.3°F | Resp 16 | Wt 212.0 lb

## 2024-01-20 DIAGNOSIS — D696 Thrombocytopenia, unspecified: Secondary | ICD-10-CM | POA: Diagnosis present

## 2024-01-20 DIAGNOSIS — N184 Chronic kidney disease, stage 4 (severe): Secondary | ICD-10-CM | POA: Diagnosis not present

## 2024-01-20 DIAGNOSIS — D649 Anemia, unspecified: Secondary | ICD-10-CM | POA: Insufficient documentation

## 2024-01-20 NOTE — Progress Notes (Signed)
Patient is doing about the same no new questions or concerns for the doctor today.

## 2024-01-23 ENCOUNTER — Encounter: Payer: Self-pay | Admitting: Internal Medicine

## 2024-01-23 NOTE — Progress Notes (Signed)
 [facility]  [phone] [phone]  ID: Gillermo Murdoch. OB: 01-22-1956  MR#: [id number]  AOZ#:[id number]  Patient Care Team: Pcp, No as PCP - General Michaelyn Barter, MD as Consulting Physician (Oncology)  REFERRING PROVIDER: Franco Nones, NP  REASON FOR REFERRAL: thrombocytopenia, anemia  HPI: Brandon Maynard. is a 68 y.o. male with past medical history of CKD stage IV, hypertension, schizophrenia previously on lithium causing kidney injury, thrombocytopenia, anemia, hypothyroidism, GERD referred to hematology for evaluation of anemia and thrombocytopenia.  Patient has known anemia and thrombocytopenia at least since 2011.  Baseline hemoglobin around 10.  Platelets around 80-130.  Bone marrow biopsy on 08/10/2016 showed variably cellular marrow ranging 20 to 50% with increased erythroid precursors, marginally increased monocytic cells and mild multilineage dyspoiesis.  Multiple small epithelioid granulomas.  Nonspecific.  Findings are nondiagnostic.  Could be related to medication.  Low-grade MDS cannot be entirely excluded.  Karyotype showed normal male XY.  Evaluated by Dr. Merlene Pulling, hematology in September 2017.  At that time, patient was admitted with sepsis from left leg cellulitis/E. coli bacteremia/AKI.  His anemia was contributed to CKD.  Worsening thrombocytopenia likely from acute illness.  PET/CT was done with no lesions concerning for malignancy.  Denies alcohol use.    Patient lives in a group home.  His brother, [name] is guardian who assist with decision-making.   Interval history Patient was seen today accompanied with the caretaker from the group home. Doing well overall.  Denies any new concerns   REVIEW OF SYSTEMS:   ROS  As per HPI. Otherwise, a complete review of systems is negative.  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   Thyroid disease     PAST SURGICAL HISTORY: Past Surgical History:  Procedure Laterality Date   Dermal Abrasion  1973   PERIPHERAL VASCULAR CATHETERIZATION N/A 10/14/2016   Procedure: Dialysis/Perma Catheter Insertion;  Surgeon: Annice Needy, MD;  Location: [facility] INVASIVE CV LAB;  Service: Cardiovascular;  Laterality: N/A;   PERIPHERAL VASCULAR CATHETERIZATION N/A 11/11/2016   Procedure: Dialysis/Perma Catheter Removal;  Surgeon: Annice Needy, MD;  Location: [facility] INVASIVE CV LAB;  Service: Cardiovascular;  Laterality: N/A;    FAMILY HISTORY: Family History  Problem Relation Age of Onset   Brain cancer Maternal Uncle    Breast cancer Mother    Melanoma Mother    Congestive Heart Failure Father    Melanoma Father    Breast cancer Maternal Aunt    Diabetes Maternal Aunt     HEALTH MAINTENANCE: Social History   Tobacco Use   Smoking status: Former    Current packs/day: 0.00    Types: Cigarettes    Quit date: 04/09/1991    Years since quitting: 32.8   Smokeless tobacco: Never  Substance Use Topics   Alcohol use: No    Alcohol/week: 0.0 standard drinks of alcohol   Drug use: No     Allergies  Allergen Reactions   Tetracyclines & Related Hives    Current Outpatient Medications  Medication Sig Dispense Refill   acetaminophen (TYLENOL) 325 MG tablet Take 650  mg by mouth every 6 (six) hours as needed.     amLODipine (NORVASC) 10 MG tablet Take 10 mg by mouth daily.     atorvastatin (LIPITOR) 10 MG tablet Take 10 mg by mouth daily.     b complex vitamins tablet Take 1 tablet by mouth daily.     bimatoprost (LUMIGAN) 0.01 % SOLN Place 1 drop into both eyes at bedtime.     bisacodyl (DULCOLAX) 5 MG EC tablet Take 10 mg by mouth daily as needed for moderate  constipation.     brimonidine-timolol (COMBIGAN) 0.2-0.5 % ophthalmic solution Place 1 drop into both eyes 2 (two) times daily.      clonazePAM (KLONOPIN) 1 MG tablet Take 1 tablet (1 mg total) by mouth at bedtime. 30 tablet 0   clotrimazole-betamethasone (LOTRISONE) cream Apply 1 Application topically 2 (two) times daily. 30 g 4   divalproex (DEPAKOTE ER) 250 MG 24 hr tablet Take 750 mg by mouth 2 (two) times daily.     divalproex (DEPAKOTE ER) 500 MG 24 hr tablet Take 2 tablets (1,000 mg total) by mouth at bedtime. 60 tablet 0   FLUoxetine (PROZAC) 10 MG capsule Take 30 mg by mouth daily.      labetalol (NORMODYNE) 100 MG tablet TAKE 1 TABLET BY MOUTH TWICE A DAY 60 tablet 0   latanoprost (XALATAN) 0.005 % ophthalmic solution Place 1 drop into both eyes at bedtime.     levothyroxine (SYNTHROID, LEVOTHROID) 50 MCG tablet TAKE 1 TABLET BY MOUTH EVERY MORNING 30 tablet 0   Multiple Vitamins-Minerals (MULTIVITAMIN WITH MINERALS) tablet TAKE 1 TABLET BY MOUTH DAILY 30 tablet 0   pyridOXINE (VITAMIN B-6) 50 MG tablet TAKE 2 TABLETS BY MOUTH TWICE A DAY 120 tablet 0   simvastatin (ZOCOR) 20 MG tablet TAKE 1 TABLET BY MOUTH EVERY EVENING AT AT BEDTIME 30 tablet 0   sulfamethoxazole-trimethoprim (BACTRIM DS) 800-160 MG tablet Take 1 tablet by mouth 2 (two) times daily. 20 tablet 0   terbinafine (LAMISIL) 250 MG tablet Take 1 tablet (250 mg total) by mouth daily. 90 tablet 0   traZODone (DESYREL) 100 MG tablet Take 1 tablet (100 mg total) by mouth at bedtime. 30 tablet 0   No current facility-administered medications for this visit.   Facility-Administered Medications Ordered in Other Visits  Medication Dose Route Frequency Provider Last Rate Last Admin   cyanocobalamin ((VITAMIN B-12)) injection 1,000 mcg  1,000 mcg Intramuscular Once Rosey Bath, MD        OBJECTIVE: Vitals:   01/20/24 1342  BP: (!) 158/60  Pulse: 66  Resp: 16  Temp: 98.3 F (36.8 C)  SpO2: 99%     Body mass index  is 26.5 kg/m.      General: Well-developed, well-nourished, no acute distress. Eyes: Pink conjunctiva, anicteric sclera. HEENT: Normocephalic, moist mucous membranes, clear oropharnyx. Lungs: Clear to auscultation bilaterally. Heart: Regular rate and rhythm. No rubs, murmurs, or gallops. Abdomen: Soft, nontender, nondistended. No organomegaly noted, normoactive bowel sounds. Musculoskeletal: No edema, cyanosis, or clubbing. Neuro: Alert, answering all questions appropriately. Cranial nerves grossly intact. Skin: No rashes or petechiae noted. Psych: Normal affect. Lymphatics: No cervical, calvicular, axillary or inguinal LAD.   LAB RESULTS:  Lab Results  Component Value Date   NA 140 04/10/2020   K 4.5 04/10/2020   CL 107 04/10/2020   CO2 24 04/10/2020   GLUCOSE 108 (H) 04/10/2020   BUN 42 (H) 04/10/2020   CREATININE 3.17 (H) 04/10/2020   CALCIUM  9.0 04/10/2020   PROT 7.2 12/30/2023   ALBUMIN 3.7 12/30/2023   AST 20 12/30/2023   ALT 14 12/30/2023   ALKPHOS 68 12/30/2023   BILITOT 0.6 12/30/2023   GFRNONAA 19 (L) 04/10/2020   GFRAA 22 (L) 04/10/2020    Lab Results  Component Value Date   WBC 5.2 12/30/2023   NEUTROABS 3.4 12/30/2023   HGB 9.5 (L) 12/30/2023   HCT 28.5 (L) 12/30/2023   MCV 93.4 12/30/2023   PLT 96 (L) 12/30/2023    Lab Results  Component Value Date   TIBC 312 12/30/2023   TIBC 197 (L) 10/21/2016   TIBC 383 06/16/2016   FERRITIN 107 12/30/2023   FERRITIN 359 (H) 10/21/2016   FERRITIN 71 08/03/2016   IRONPCTSAT 15 (L) 12/30/2023   IRONPCTSAT 10 (L) 10/21/2016   IRONPCTSAT 14 (L) 06/16/2016     STUDIES: No results found.  ASSESSMENT AND PLAN:   Joah Patlan. is a 68 y.o. male with pmh of CKD stage IV, hypertension, schizophrenia previously on lithium causing kidney injury, thrombocytopenia, anemia, hypothyroidism, GERD referred to hematology for evaluation of anemia and thrombocytopenia.  # Anemia # Thrombocytopenia - Patient has  known anemia and thrombocytopenia at least since 2011.  Baseline hemoglobin around 10.  Platelets around 80-130.  - Bone marrow biopsy on 08/10/2016 showed variably cellular marrow ranging 20 to 50% with increased erythroid precursors, marginally increased monocytic cells and mild multilineage dyspoiesis.  Multiple small epithelioid granulomas.  Nonspecific.  Findings are nondiagnostic.  Could be related to medication.  Low-grade MDS cannot be entirely excluded.  Karyotype showed normal male XY. Evaluated by Dr. Merlene Pulling, hematology in September 2017.  At that time, patient was admitted with sepsis from left leg cellulitis/E. coli bacteremia/AKI.  His anemia was contributed to CKD.  Worsening thrombocytopenia likely from acute illness.  PET/CT was done with no lesions concerning for malignancy.  -Work up-  Hepatitis B/C/HIV negative. SPEP/IFE no M protein.  Kappa 100, lambda 67 with normal ratio 1.49.  ANA negative.  Ferritin 107.  Saturation 15% copper normal 117.  EPO 10.2 which is low for the degree of anemia.  TSH normal.  Vitamin B12/folate normal.  -Patient's thrombocytopenia overall remains stable since 2011 ranging between 80-1 30.  Workup so far has been negative.  Will obtain ultrasound of the abdomen to rule out any liver cirrhosis/splenomegaly.  If negative, could be related to ?chronic ITP  -He does have slightly worsening anemia which could be related to  CKD stage IV.  EPO is low for the degree of anemia.  However he is still holding his hemoglobin above 9.  Will hold off on Aranesp injection until hemoglobin goes below 9.  Discussed monthly administration and side effects such as hypertension, increased risk of thromboembolic event and cardiovascular event reported primarily with hemoglobin above 11.  Follow-up in 3 months with labs and possible Aranesp.  # CKD stage IV -Likely secondary to prior lithium use/hypertension.  Follows with [facility] nephrology.  Orders Placed This Encounter   Procedures   US Abdomen Complete   CBC with Differential (Cancer Center Only)   Iron and TIBC([facility])   RTC in 3 months for MD visit, labs, possible aranesp  Patient expressed understanding and was in agreement with this plan. He also understands that He can call clinic at any time with any questions, concerns, or complaints.   I spent a total of 30 minutes reviewing chart data, face-to-face evaluation with the patient, counseling and coordination of  care as detailed above.  Michaelyn Barter, MD   01/23/2024 12:48 PM

## 2024-02-08 ENCOUNTER — Encounter: Payer: Self-pay | Admitting: Podiatry

## 2024-02-08 ENCOUNTER — Ambulatory Visit (INDEPENDENT_AMBULATORY_CARE_PROVIDER_SITE_OTHER): Payer: 59 | Admitting: Podiatry

## 2024-02-08 DIAGNOSIS — M79674 Pain in right toe(s): Secondary | ICD-10-CM

## 2024-02-08 DIAGNOSIS — B351 Tinea unguium: Secondary | ICD-10-CM | POA: Diagnosis not present

## 2024-02-08 DIAGNOSIS — M79675 Pain in left toe(s): Secondary | ICD-10-CM | POA: Diagnosis not present

## 2024-02-08 MED ORDER — CLOTRIMAZOLE-BETAMETHASONE 1-0.05 % EX CREA: 1.0000 | TOPICAL_CREAM | Freq: Every day | CUTANEOUS | 4 refills | Status: DC | PRN

## 2024-02-09 NOTE — Progress Notes (Signed)
  Subjective:  Patient ID: Brandon Murdoch., male    DOB: January 22, 1956,  MRN: [id number]  Chief Complaint  Patient presents with   Nail Problem    "Trim my nails."  Staff worker from group home stated, "My brother wants the doctor to discontinue the Lotrisone."    68 y.o. male presents with the above complaint. History confirmed with patient.  Otherwise no new issues doing well  Objective:  Physical Exam: warm, good capillary refill, no trophic changes or ulcerative lesions, normal DP and PT pulses, normal sensory exam, and this has largely resolved Left Foot: dystrophic yellowed discolored nail plates with subungual debris Right Foot: dystrophic yellowed discolored nail plates with subungual debris   Assessment:   1. Pain due to onychomycosis of toenails of both feet      Plan:  Patient was evaluated and treated and all questions answered.  Discussed the etiology and treatment options for the condition in detail with the patient.  Recommended debridement of the nails today. Sharp and mechanical debridement performed of all painful and mycotic nails today. Nails debrided in length and thickness using a nail nipper to level of comfort. Discussed treatment options including appropriate shoe gear. Follow up as needed for painful nails.  Has largely resolved, new prescription for Lotrisone sent to the facility pharmacy so they may use this only as needed and not daily  Return if symptoms worsen or fail to improve.

## 2024-02-13 ENCOUNTER — Ambulatory Visit: Payer: 59

## 2024-03-13 ENCOUNTER — Encounter: Payer: Self-pay | Admitting: Gastroenterology

## 2024-03-19 ENCOUNTER — Encounter: Payer: Self-pay | Admitting: Gastroenterology

## 2024-03-20 ENCOUNTER — Encounter: Payer: Self-pay | Admitting: Emergency Medicine

## 2024-03-26 ENCOUNTER — Ambulatory Visit: Admission: RE | Admit: 2024-03-26 | Source: Home / Self Care | Admitting: Gastroenterology

## 2024-03-26 SURGERY — COLONOSCOPY
Anesthesia: General

## 2024-04-27 ENCOUNTER — Other Ambulatory Visit: Payer: Self-pay

## 2024-04-27 DIAGNOSIS — D649 Anemia, unspecified: Secondary | ICD-10-CM

## 2024-05-01 ENCOUNTER — Encounter: Payer: Self-pay | Admitting: Oncology

## 2024-05-01 ENCOUNTER — Inpatient Hospital Stay: Payer: 59 | Attending: Oncology

## 2024-05-01 ENCOUNTER — Inpatient Hospital Stay: Payer: 59 | Admitting: Oncology

## 2024-05-01 ENCOUNTER — Inpatient Hospital Stay: Payer: 59

## 2024-05-30 ENCOUNTER — Encounter: Payer: Self-pay | Admitting: Podiatry

## 2024-05-30 ENCOUNTER — Ambulatory Visit (INDEPENDENT_AMBULATORY_CARE_PROVIDER_SITE_OTHER): Admitting: Podiatry

## 2024-05-30 DIAGNOSIS — M79674 Pain in right toe(s): Secondary | ICD-10-CM | POA: Diagnosis not present

## 2024-05-30 DIAGNOSIS — M79675 Pain in left toe(s): Secondary | ICD-10-CM

## 2024-05-30 DIAGNOSIS — B351 Tinea unguium: Secondary | ICD-10-CM | POA: Diagnosis not present

## 2024-05-30 NOTE — Progress Notes (Signed)
  Subjective:  Patient ID: Brandon ONEIDA Damita Mickey., male    DOB: 1956/10/14,  MRN: [id number]  Chief Complaint  Patient presents with   RFC    Rm8 [facility] not diabetic/ Dr. Donal    68 y.o. male presents with the above complaint. History confirmed with patient.  Otherwise no new issues doing well  Objective:  Physical Exam: warm, good capillary refill, no trophic changes or ulcerative lesions, normal DP and PT pulses, normal sensory exam,  Left Foot: dystrophic yellowed discolored nail plates with subungual debris Right Foot: dystrophic yellowed discolored nail plates with subungual debris   Assessment:   1. Pain due to onychomycosis of toenails of both feet      Plan:  Patient was evaluated and treated and all questions answered.  Discussed the etiology and treatment options for the condition in detail with the patient.  Recommended debridement of the nails today. Sharp and mechanical debridement performed of all painful and mycotic nails today. Nails debrided in length and thickness using a nail nipper to level of comfort. Discussed treatment options including appropriate shoe gear. Follow up as needed for painful nails.     Return if symptoms worsen or fail to improve.

## 2024-10-04 ENCOUNTER — Ambulatory Visit (INDEPENDENT_AMBULATORY_CARE_PROVIDER_SITE_OTHER): Admitting: Podiatry

## 2024-10-04 DIAGNOSIS — M79674 Pain in right toe(s): Secondary | ICD-10-CM

## 2024-10-04 DIAGNOSIS — M79675 Pain in left toe(s): Secondary | ICD-10-CM

## 2024-10-04 DIAGNOSIS — B351 Tinea unguium: Secondary | ICD-10-CM | POA: Diagnosis not present

## 2024-10-04 NOTE — Progress Notes (Signed)
  Subjective:  Patient ID: Brandon ONEIDA Damita Mickey., male    DOB: 1956/11/06,  MRN: [id number]  Chief Complaint  Patient presents with   Nail Problem    Nail trim     68 y.o. male presents with the above complaint. History confirmed with patient.  Otherwise no new issues doing well  Objective:  Physical Exam: warm, good capillary refill, no trophic changes or ulcerative lesions, normal DP and PT pulses, normal sensory exam,  Left Foot: dystrophic yellowed discolored nail plates with subungual debris Right Foot: dystrophic yellowed discolored nail plates with subungual debris   Assessment:   1. Pain due to onychomycosis of toenails of both feet       Plan:  Patient was evaluated and treated and all questions answered.  Discussed the etiology and treatment options for the condition in detail with the patient.  Recommended debridement of the nails today. Sharp and mechanical debridement performed of all painful and mycotic nails today. Nails debrided in length and thickness using a nail nipper to level of comfort. Discussed treatment options including appropriate shoe gear. Follow up as needed for painful nails.     No follow-ups on file.

## 2024-12-27 NOTE — Progress Notes (Addendum)
 "   Nephrology Clinic Note  Referring Provider: Donal Channing Floss, FNP   PCP:  Donal Channing Floss, FNP  Reason for visit: ESRD  ASSESSMENT/PLAN: [name] is a 69 y.o. patient w/ PMH of schizoaffective disorder, lithium use in the past, hypertension, hypothyroidism, anemia of chronic disease, basal cell carcinoma to L nasolabial fold, right bundle branch block w/ bradycardia, involuntary commitment/incarceration and glaucoma , seen in consultation by Donal Channing Floss, FNP for management of ESRD  ESRD: renal disease attributed to lithium use and  HTN.  We have been conservatively managing renal disease for past several years, has had no overt dialysis indications until now. Has evidence of volume overload (2+ edema and dyspnea on exertion) despite taking diuretics, uremia (BUN 92). Has fatigue and severe itching. He meets indications for dialysis, but he reports that he does not want to do dialysis due to quality of life impact. I offered a trial of dialysis, but he is declining this as well. Family meeting with [name] (POA, HCDM) and his other brother [name]. We all want to support [name]'s desire to not pursue dialysis. Will refer for hospice benefits. He prefers to continue living in his group home where he is happy for now, unless his needs change. He prefers to stay out of hospital if possible. Discussed with PCP, they will refer him to home hospice. Filled out DNR/DNI form.  - Statin: atorvastatin 10 mg; vascepa  1 g bid- can continue while he can tolerate pills.  - Dialysis planning/access: UC AVF placed 11/10/22 at [facility], balloon anigoplasty of anastomosis on 03/09/23. Seen by vascular on 10/07/23, access is ok to use if needed for iHD. Remains functional with great thrill/bruit on exam. However, he is declining pursuing dialysis.   Secondary Hyperparathyroidism:  Ca 8.3. Phos 6.9.  Continue renvela 800 mg with each meal while he can tolerate. Denies GI side effects - PTH:  391 - Vitamin D: 32.7 (10/25/23)  Hypertension: BP Readings from Last 3 Encounters:  12/27/24 108/49  11/19/24 128/53  11/12/24 140/59  Reports dyspnea on exertion, satting 93% on RA - Continue amlodipine 10 mg daily. Increase torsemide from 40 to 80 mg daily.  - Has 2+ edema, increased from before - HR 40-50, avoid beta blocker. Chronic bradycardia  - Continue imdur 30 mg daily.   Hematology/Anemia:  - Hgb - 7.5. Has missed ESA doses.  Completd 2 infusions of ferrlecit Discussed that we can more easily treat anemia with ESAs and iron  on dialysis, but he continues to decline dialysis.   Schizoaffective Disorder: olanzapine , fluoxetine  Hypothyroidism: levothyroxine  50 mcg daily   -------------------------------------------------------------------------------------------------------  HPI:  Mr. Brandon Maynard is a 69 y.o. with a past medical history of schizoaffective disorder, lithium use in the past, hypertension, hypothyroidism, anemia of chronic disease, basal cell carcinoma to L nasolabial fold, right bundle branch block w/ bradycardia, involuntary commitment/incarceration and glaucoma  who follows in our clinic management of late stage CKD.   Background: In the past, he has been followed by nephrology in [place] however he moved to the area and has established residence in a new group home. Past medical history is significant for schizoaffective disorder, lithium use in the past, hypertension, hypothyroidism, anemia of chronic disease, basal cell carcinoma to L nasolabial fold, right bundle branch block w/ bradycardia, involuntary commitment/incarceration and glaucoma. In the past, he did require 2-3 sessions of dialysis for an acute kidney injury in 2017 - inciting event unknown. At referral, his baseline creatinine appeared to be somewhere around  3.2, but has been steadily increasing.   Family Hx: Family history is positive for mother with heart disease who required angioplasty as well  as a father with congestive heart failure.   Social Hx: His brother who resides in [place], [place]  is his power of attorney. He has a second brother who resides in [place]. Now going to [employer] for daycare. He enjoys this and the activities he can participate in. He quit smoking in 2023!!!   He has a LUE AVF, placed by [facility] surgery. IT is mature and ready to cannulate when needed. He continues to follow up with them.  He was seen by my colleague Dr. Laron in December 2024. Noted to have increasing proteinuria and elevated blood pressures. He was started on torsemide 10 g daily and started calcitriol.   In January 2025, caretaker made appt in clinic due to onset of itching about a week ago. Reports itching all over stomach and back. He later reported that it has been there since covid shot about 6 weeks prior, more itching on shoulder. NO notable rash. Reports that swelling is unchanged with torsemide, still has 1+ swelling. BP was slightly better. We stopped his calcitriol and increased torsemide to 20 mg daily.  Urine studies January 2025: UA: neg leu, neg nit, 2+ protein, pH 6, small blood, SG 1.010 Sediment: bland Spep/IFE/SFLCs were negative for monoclonal protein. His quant TB gold was indeterminate.   12/29/23 visit: Appt today was made by his PCP due to darkening skin and notable edema in legs and arms. Also appeared to be less responsive at that appt. His caretaker reports that [name] may have been annoyed by the amount of questioning at that visit. His caretaker and patient reports no change in his clinical status. Denies itching. He is doing his daily activities that he enjoys. He is eating a high salt diet- which I asked him to limit. He has unchanged 1+ edema in legs, trace edema in his arms, lungs are clear, no dyspnea or orthopnea. Wt is down to 204 lb.  He denies fatigue, has very good appetite. He reports that he does not want to start dialysis.  Normal  LFTs today.   March 2024 visit: He presents with caretaker. He reports no change in symptoms. Continues to have stable lower extremity edema.  Quant TB gold was indeterminate again. His caretaker reports that he has had negative PPDs   April 2025 visit: he presents for follow up on his own.  He has been scheduled for routine colonoscopy but scared. Does report fatigue  1-2+ edema (with compression socks on), not bothersome.  Lungs are clear no orthopnea  Wt down to 203 lb  Discussed high phos levels.  Admits to enjoying dairy   June 2025 visit: he presents with caretaker. Also his brother and caretaker's brother are also on phone.  Sees PCP Channing in July 19.  He reports that he continues to feel well, and would like to hold off on starting dialysis as long as possible. He reports that he is eating well. Edema is stable. No dyspnea. Wt stable at 208 lbs.   June 2025:  had labs done by PCP on 7/10: Tsat 25%, Ferritin 108, Hgb 10.1, LDL 34, TSH 1.48, Na 144, K 3.8, Cl 109, bicarb 23, BUN 71, Cr 6.8 (EGFR 8).  Got covid and flu today  Appetite is good, feels well.  No new symptom No longer smoking (quit 2.5 years ago)  Sept 2025:  he presents for follow up. He continues to feel well. Denies loss of appetite, or fatigue. Edema in lower extremities is stable. Had labs with PCP over a month ago and they were stable. They are not checking blood pressures at group home, but they can in future.   12/26/23: virtual visit due to weather. He reports significant fluid retention, itching, weakness. Appetite is good. We discussed starting dialysis, but he does not want to.   Today: Family meeting with patient in clinic. Both brothers [name] and Public House Manager on the phone. We discussed a trial of dialysis vs conservative care without dialysis. He is declining a trial of dialysis. Denies dyspnea, O2 sats 93%.  He prefers to avoid admission. Does not want to pursue dialysis. Prefers to stay comfortable with less  intervention. Agreed to pursue hospice care.   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   Glaucoma    Hypertension 03/14/2017   Hypothyroidism 12/12/2018   Schizoaffective disorder    (CMS-HCC) 06/13/2014    ALLERGIES Tetracycline  SOCIAL HISTORY Social History   Socioeconomic History   Marital status: Single  Tobacco Use   Smoking status: Former    Average packs/day: 0.5 packs/day for 52.0 years (26.0 ttl pk-yrs)    Types: Cigarettes    Start date: 1970   Smokeless tobacco: Never  Vaping Use   Vaping status: Never Used  Substance and Sexual Activity   Alcohol use: Not Currently   Drug use: Not Currently   Sexual activity: Not Currently   Social Drivers of Health   Tobacco Use: Medium Risk (11/12/2024)   Patient History    Smoking Tobacco Use: Former    Smokeless Tobacco Use: Never      FAMILY HISTORY Family History  Problem Relation Age of Onset   Glaucoma Father    Basal cell carcinoma Neg Hx    Squamous cell carcinoma Neg Hx      MEDICATIONS: Current Outpatient Medications  Medication Sig Dispense Refill   acetaminophen  (TYLENOL ) 325 MG tablet Take 2 tablets (650 mg total) by mouth every six (6) hours as needed.     amLODIPine (NORVASC) 10 MG tablet every morning.     atorvastatin (LIPITOR) 10 MG tablet      clotrimazole -betamethasone  (LOTRISONE ) 1-0.05 % cream Apply 1 Application topically Two (2) times a day (at 8am and 12:00).     compress.stocking,knee,reg,lrg Misc Apply in the morning and remove at night. 1 each 2   dextran 70/hypromellose (ARTIFICIAL TEARS OPHT) Apply to eye two (2) times a day.     ferrous sulfate 325 (65 FE) MG tablet Take 1 tablet (325 mg total) by mouth  in the morning.     isosorbide mononitrate (IMDUR) 30 MG 24 hr tablet Take 1 tablet (30 mg total) by mouth daily. 90 tablet 3   latanoprost  (XALATAN ) 0.005 % ophthalmic solution nightly.     levothyroxine  (SYNTHROID ) 50 MCG tablet Take 1 tablet (50 mcg total) by mouth every morning.     multivitamin with minerals tablet Take 1 tablet by mouth every morning.     OLANZapine  (ZYPREXA ) 15 MG tablet two (2) times a day.     sevelamer (RENVELA) 800 mg tablet Take 1 tablet (800 mg total)  by mouth Three (3) times a day with a meal. 90 tablet 11   sevelamer (RENVELA) 800 mg tablet Take 1 tablet (800 mg total) by mouth two (2) times a day as needed (with snacks). 90 tablet 11   timolol  (TIMOPTIC ) 0.5 % ophthalmic solution Two (2) times a day.     torsemide (DEMADEX) 20 MG tablet Take 4 tablets (80 mg total) by mouth daily. 62 tablet 0   traZODone  (DESYREL ) 150 MG tablet Take 1 tablet (150 mg total) by mouth nightly.     VASCEPA  1 gram cap Two (2) times a day.     No current facility-administered medications for this visit.    PHYSICAL EXAM:   Vitals:   12/27/24 1359  BP: 108/49  Pulse: (!) 43  SpO2: (!) 82%      General: fatigued-appearing, no acute distress HEENT: anicteric sclera CV: RRR, 3/6 systolic murmur S1/S2 clear and present, 2+ edema in BLE, 1+ edema in UEs Access: L-AVF, good bruit and thrill Pulm: CTAB, normal wob at rest, dyspnea on exertion GI: soft, non-tender, distended MSK: no visible joint swelling Skin: no visible lesions or rashes Neuro: alert when he is engaged in discussion, slightly slurred speech  Psych: appropriate mood and affect   Labs/studies: Reviewed  Imagining studies: Reviewed  I personally spent 45 minutes face-to-face and non-face-to-face in the care of this patient, which includes all pre, intra, and post visit time on the date of service.  All documented time was specific to the E/M visit and does not include any procedures that may have  been performed.        "

## 2024-12-31 ENCOUNTER — Inpatient Hospital Stay
Admission: EM | Admit: 2024-12-31 | Discharge: 2025-01-03 | Disposition: A | Source: Home / Self Care | Attending: Hospitalist | Admitting: Hospitalist

## 2024-12-31 ENCOUNTER — Other Ambulatory Visit: Payer: Self-pay

## 2024-12-31 ENCOUNTER — Emergency Department

## 2024-12-31 ENCOUNTER — Encounter: Payer: Self-pay | Admitting: *Deleted

## 2024-12-31 DIAGNOSIS — D649 Anemia, unspecified: Secondary | ICD-10-CM

## 2024-12-31 DIAGNOSIS — R0609 Other forms of dyspnea: Secondary | ICD-10-CM | POA: Diagnosis not present

## 2024-12-31 DIAGNOSIS — E875 Hyperkalemia: Secondary | ICD-10-CM

## 2024-12-31 DIAGNOSIS — R001 Bradycardia, unspecified: Secondary | ICD-10-CM

## 2024-12-31 DIAGNOSIS — R0600 Dyspnea, unspecified: Secondary | ICD-10-CM | POA: Diagnosis present

## 2024-12-31 DIAGNOSIS — R600 Localized edema: Secondary | ICD-10-CM

## 2024-12-31 DIAGNOSIS — Z515 Encounter for palliative care: Principal | ICD-10-CM

## 2024-12-31 LAB — CBC
HCT: 22.3 % — ABNORMAL LOW (ref 39.0–52.0)
Hemoglobin: 6.7 g/dL — ABNORMAL LOW (ref 13.0–17.0)
MCH: 30.5 pg (ref 26.0–34.0)
MCHC: 30 g/dL (ref 30.0–36.0)
MCV: 101.4 fL — ABNORMAL HIGH (ref 80.0–100.0)
Platelets: 58 10*3/uL — ABNORMAL LOW (ref 150–400)
RBC: 2.2 MIL/uL — ABNORMAL LOW (ref 4.22–5.81)
RDW: 17.2 % — ABNORMAL HIGH (ref 11.5–15.5)
WBC: 6.8 10*3/uL (ref 4.0–10.5)
nRBC: 0.4 % — ABNORMAL HIGH (ref 0.0–0.2)

## 2024-12-31 LAB — TROPONIN T, HIGH SENSITIVITY: Troponin T High Sensitivity: 125 ng/L (ref 0–19)

## 2024-12-31 LAB — COMPREHENSIVE METABOLIC PANEL WITH GFR
ALT: 28 U/L (ref 0–44)
AST: 24 U/L (ref 15–41)
Albumin: 3.5 g/dL (ref 3.5–5.0)
Alkaline Phosphatase: 94 U/L (ref 38–126)
Anion gap: 15 (ref 5–15)
BUN: 108 mg/dL — ABNORMAL HIGH (ref 8–23)
CO2: 15 mmol/L — ABNORMAL LOW (ref 22–32)
Calcium: 8.2 mg/dL — ABNORMAL LOW (ref 8.9–10.3)
Chloride: 111 mmol/L (ref 98–111)
Creatinine, Ser: 8.33 mg/dL — ABNORMAL HIGH (ref 0.61–1.24)
GFR, Estimated: 6 mL/min — ABNORMAL LOW
Glucose, Bld: 83 mg/dL (ref 70–99)
Potassium: 6.3 mmol/L (ref 3.5–5.1)
Sodium: 140 mmol/L (ref 135–145)
Total Bilirubin: 0.3 mg/dL (ref 0.0–1.2)
Total Protein: 6 g/dL — ABNORMAL LOW (ref 6.5–8.1)

## 2024-12-31 LAB — PRO BRAIN NATRIURETIC PEPTIDE: Pro Brain Natriuretic Peptide: 3871 pg/mL — ABNORMAL HIGH

## 2024-12-31 LAB — BASIC METABOLIC PANEL WITH GFR
Anion gap: 14 (ref 5–15)
BUN: 107 mg/dL — ABNORMAL HIGH (ref 8–23)
CO2: 16 mmol/L — ABNORMAL LOW (ref 22–32)
Calcium: 8 mg/dL — ABNORMAL LOW (ref 8.9–10.3)
Chloride: 110 mmol/L (ref 98–111)
Creatinine, Ser: 8.02 mg/dL — ABNORMAL HIGH (ref 0.61–1.24)
GFR, Estimated: 7 mL/min — ABNORMAL LOW
Glucose, Bld: 86 mg/dL (ref 70–99)
Potassium: 6.5 mmol/L (ref 3.5–5.1)
Sodium: 140 mmol/L (ref 135–145)

## 2024-12-31 LAB — CBG MONITORING, ED: Glucose-Capillary: 95 mg/dL (ref 70–99)

## 2024-12-31 LAB — MAGNESIUM: Magnesium: 3.2 mg/dL — ABNORMAL HIGH (ref 1.7–2.4)

## 2024-12-31 LAB — PREPARE RBC (CROSSMATCH)

## 2024-12-31 MED ORDER — CALCIUM GLUCONATE 10 % IV SOLN
1.0000 g | Freq: Once | INTRAVENOUS | Status: AC
  Administered 2024-12-31: 1 g via INTRAVENOUS
  Filled 2024-12-31: qty 10

## 2024-12-31 MED ORDER — SODIUM CHLORIDE 0.9 % IV SOLN: Freq: Once | INTRAVENOUS | Status: AC

## 2024-12-31 MED ORDER — ACETAMINOPHEN 500 MG PO TABS
1000.0000 mg | ORAL_TABLET | Freq: Three times a day (TID) | ORAL | Status: DC | PRN
  Administered 2025-01-01 – 2025-01-02 (×2): 1000 mg via ORAL
  Filled 2024-12-31 (×2): qty 2

## 2024-12-31 MED ORDER — DEXTROSE 50 % IV SOLN
1.0000 | Freq: Once | INTRAVENOUS | Status: AC
  Administered 2024-12-31: 50 mL via INTRAVENOUS
  Filled 2024-12-31: qty 50

## 2024-12-31 MED ORDER — SODIUM ZIRCONIUM CYCLOSILICATE 10 G PO PACK
10.0000 g | PACK | Freq: Once | ORAL | Status: AC
  Administered 2024-12-31: 10 g via ORAL
  Filled 2024-12-31: qty 1

## 2024-12-31 MED ORDER — INSULIN ASPART 100 UNIT/ML IV SOLN
5.0000 [IU] | Freq: Once | INTRAVENOUS | Status: AC
  Administered 2024-12-31: 5 [IU] via INTRAVENOUS
  Filled 2024-12-31: qty 5

## 2024-12-31 MED ORDER — FUROSEMIDE 10 MG/ML IJ SOLN
80.0000 mg | Freq: Once | INTRAMUSCULAR | Status: AC
  Administered 2024-12-31: 80 mg via INTRAVENOUS
  Filled 2024-12-31: qty 8

## 2024-12-31 MED ORDER — ONDANSETRON HCL 4 MG/2ML IJ SOLN: 4.0000 mg | Freq: Four times a day (QID) | INTRAMUSCULAR | Status: DC | PRN

## 2024-12-31 MED ORDER — HEPARIN SODIUM (PORCINE) 5000 UNIT/ML IJ SOLN
5000.0000 [IU] | Freq: Three times a day (TID) | INTRAMUSCULAR | Status: DC
  Administered 2024-12-31 – 2025-01-03 (×7): 5000 [IU] via SUBCUTANEOUS
  Filled 2024-12-31 (×8): qty 1

## 2024-12-31 MED ORDER — SODIUM CHLORIDE 0.9% IV SOLUTION
Freq: Once | INTRAVENOUS | Status: AC
  Filled 2024-12-31: qty 250

## 2024-12-31 MED ORDER — POLYETHYLENE GLYCOL 3350 17 G PO PACK: 17.0000 g | PACK | Freq: Two times a day (BID) | ORAL | Status: DC | PRN

## 2024-12-31 NOTE — ED Notes (Signed)
 Patient bradycardic with heart rate 40-52 bpm. Dr Awanda aware. No new orders received

## 2024-12-31 NOTE — Progress Notes (Addendum)
 [facility] ED [facility] Liaison Note  Dr. Rexford contacted [facility] stating that patient's legal guardian (brother) is interested in hospice services.  [facility] will follow up tomorrow morning.    Please call with any Hospice related questions or concerns.   Thank you for the opportunity to participate in this patient's care  [facility] Liaison 336 [phone]

## 2025-01-01 DIAGNOSIS — R0609 Other forms of dyspnea: Secondary | ICD-10-CM | POA: Diagnosis not present

## 2025-01-01 LAB — BPAM RBC
Blood Product Expiration Date: 202602032359
ISSUE DATE / TIME: 202602022041
Unit Type and Rh: 9500

## 2025-01-01 LAB — POTASSIUM: Potassium: 5.8 mmol/L — ABNORMAL HIGH (ref 3.5–5.1)

## 2025-01-01 LAB — BASIC METABOLIC PANEL WITH GFR
Anion gap: 14 (ref 5–15)
BUN: 108 mg/dL — ABNORMAL HIGH (ref 8–23)
CO2: 16 mmol/L — ABNORMAL LOW (ref 22–32)
Calcium: 7.9 mg/dL — ABNORMAL LOW (ref 8.9–10.3)
Chloride: 111 mmol/L (ref 98–111)
Creatinine, Ser: 8.4 mg/dL — ABNORMAL HIGH (ref 0.61–1.24)
GFR, Estimated: 6 mL/min — ABNORMAL LOW
Glucose, Bld: 80 mg/dL (ref 70–99)
Potassium: 5.6 mmol/L — ABNORMAL HIGH (ref 3.5–5.1)
Sodium: 141 mmol/L (ref 135–145)

## 2025-01-01 LAB — TYPE AND SCREEN
ABO/RH(D): O POS
Antibody Screen: NEGATIVE
Unit division: 0

## 2025-01-01 LAB — HEPATITIS B SURFACE ANTIGEN: Hepatitis B Surface Ag: NONREACTIVE

## 2025-01-01 LAB — HEMOGLOBIN: Hemoglobin: 6.7 g/dL — ABNORMAL LOW (ref 13.0–17.0)

## 2025-01-01 LAB — MAGNESIUM: Magnesium: 3.3 mg/dL — ABNORMAL HIGH (ref 1.7–2.4)

## 2025-01-01 MED ORDER — ICOSAPENT ETHYL 1 G PO CAPS
2.0000 g | ORAL_CAPSULE | Freq: Two times a day (BID) | ORAL | Status: DC
  Administered 2025-01-01 – 2025-01-02 (×3): 2 g via ORAL
  Filled 2025-01-01 (×6): qty 2

## 2025-01-01 MED ORDER — TRAZODONE HCL 50 MG PO TABS
150.0000 mg | ORAL_TABLET | Freq: Every day | ORAL | Status: DC
  Administered 2025-01-01 – 2025-01-02 (×2): 150 mg via ORAL
  Filled 2025-01-01 (×2): qty 1

## 2025-01-01 MED ORDER — ORAL CARE MOUTH RINSE: 15.0000 mL | OROMUCOSAL | Status: DC | PRN

## 2025-01-01 MED ORDER — LEVOTHYROXINE SODIUM 50 MCG PO TABS
50.0000 ug | ORAL_TABLET | Freq: Every day | ORAL | Status: DC
  Administered 2025-01-02: 50 ug via ORAL
  Filled 2025-01-01 (×2): qty 1

## 2025-01-01 MED ORDER — FUROSEMIDE 10 MG/ML IJ SOLN
120.0000 mg | Freq: Once | INTRAVENOUS | Status: AC
  Administered 2025-01-01: 120 mg via INTRAVENOUS
  Filled 2025-01-01: qty 10

## 2025-01-01 MED ORDER — TIMOLOL MALEATE 0.5 % OP SOLN
1.0000 [drp] | Freq: Two times a day (BID) | OPHTHALMIC | Status: DC
  Administered 2025-01-01 – 2025-01-02 (×3): 1 [drp] via OPHTHALMIC
  Filled 2025-01-01 (×2): qty 5

## 2025-01-01 MED ORDER — OLANZAPINE 10 MG PO TABS
10.0000 mg | ORAL_TABLET | Freq: Two times a day (BID) | ORAL | Status: DC
  Administered 2025-01-01 – 2025-01-02 (×3): 10 mg via ORAL
  Filled 2025-01-01 (×6): qty 1

## 2025-01-01 NOTE — Consult Note (Cosign Needed)
 "                                                                                   Consultation Note Date: 01/01/2025   Patient Name: Brandon Maynard.  DOB: 1956/04/04  MRN: [id number]  Age / Sex: 69 y.o., male  PCP: Donal Channing SQUIBB, FNP Referring Physician: Awanda City, MD  Reason for Consultation: Establishing goals of care  HPI/Patient Profile: Brandon ONEIDA Damita Mickey. is a 69 y.o. male with medical history significant of CKD, psychiatric disorders who was brought in by group home staff for leg swelling and shortness of breath with exertion.      Pt said he still makes urine, with normal intake.  Reportedly pt had declining kidney function for > 10 years.  Pt was on dialysis years back, but no currently.  It's known to group home staff and brother (legal guardian) that pt did not want dialysis, and initially when ED provider discussed with pt and brother, the goal was to admit pt to be set up with hospice and possible comfort care.  However, when I tried to confirm and explained to pt the consequences of no dialysis, then pt said he would do dialysis if necessary, same with blood transfusion and treating for hyperkalemia. Group home leader at bedside and brother on the phone both heard him and were glad pt changed his mind.   Clinical Assessment and Goals of Care: Notes and labs reviewed from current admission.  Reviewed notes from [facility].  It appears patient had a UC AVF placed 11/10/22, with angioplasty on 03/09/23.  He was seen by vascular on 10/07/23 to ensure the access was still okay to use for HD.  As per those discussions, patient refused to initiate HD as he was afraid of cannulation and changes to quality of life.   Patient with ESRD requiring dialysis.  In to see patient.  He is currently resting in bed at this time, no family at bedside.  He states he currently lives in a group home.  He states his brother helps him to make healthcare decisions.  He states he has been told that he needs dialysis to  prolong life, and he states he is amenable to this plan.  Called to speak with patient's brother [name].  Richard states he is the patient's legal guardian.  He states they have another brother Public House Manager.  Richard advises patient has been in and out of group homes for the past 12 years.  He states he has been in his current group home for around 4 years.  He states the group home ensures that he takes his medications.  He states they take them on field trips where they meet with other group homes and are able to spend time together and doing things such as playing cards.  Richard states at times he comes to pick his brother up to take him to the grocery store and to other places.  Brother discusses that patient has known about his renal disease for years.  He states patient had a dialysis access placed over a year ago.  He states his brother had a few rounds of dialysis,  and his kidneys recovered where he did not require further sessions.  He states that up until this admission, patient had been clear that he would never want to resume dialysis again.  He states during this hospitalization, he changed his mind and decided to resume treatments as he wishes to live longer.  Richard confirms DNR status.  SUMMARY OF RECOMMENDATIONS   DNR Full scope Patient amenable to dialysis     Primary Diagnoses: Present on Admission:  Dyspnea   I have reviewed the medical record, interviewed the patient and family, and examined the patient. The following aspects are pertinent.  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   Thyroid  disease    Social History   Socioeconomic History   Marital status: Single    Spouse name: Not on file    Number of children: Not on file   Years of education: Not on file   Highest education level: Not on file  Occupational History   Not on file  Tobacco Use   Smoking status: Former    Current packs/day: 0.00    Average packs/day: 0.3 packs/day    Types: Cigarettes    Quit date: 04/09/1991    Years since quitting: 33.7   Smokeless tobacco: Never  Vaping Use   Vaping status: Never Used  Substance and Sexual Activity   Alcohol use: No    Alcohol/week: 0.0 standard drinks of alcohol   Drug use: No   Sexual activity: Not on file  Other Topics Concern   Not on file  Social History Narrative   Not on file   Social Drivers of Health   Tobacco Use: Medium Risk (12/31/2024)   Patient History    Smoking Tobacco Use: Former    Smokeless Tobacco Use: Never    Passive Exposure: Not on Actuary Strain: Not on file  Food Insecurity: No Food Insecurity (01/01/2025)   Epic    Worried About Programme Researcher, Broadcasting/film/video in the Last Year: Never true    Ran Out of Food in the Last Year: Never true  Transportation Needs: No Transportation Needs (01/01/2025)   Epic    Lack of Transportation (Medical): No    Lack of Transportation (Non-Medical): No  Physical Activity: Not on file  Stress: Not on file  Social Connections: Socially Isolated (01/01/2025)   Social Connection and Isolation Panel    Frequency of Communication with Friends and Family: Twice a week    Frequency of Social Gatherings with Friends and Family: Twice a week    Attends Religious Services: Never    Database Administrator or Organizations: No    Attends Banker Meetings: Never    Marital Status: Never married  Depression (PHQ2-9): Not on file  Alcohol Screen: Not on file  Housing: Low Risk (01/01/2025)   Epic    Unable to Pay for Housing in the Last Year: No    Number of Times Moved in the Last Year: 0    Homeless in the Last Year: No  Utilities: Not At Risk (01/01/2025)   Epic    Threatened with loss of  utilities:  No  Health Literacy: Not on file   Family History  Problem Relation Age of Onset   Brain cancer Maternal Uncle    Breast cancer Mother    Melanoma Mother    Congestive Heart Failure Father    Melanoma Father    Breast cancer Maternal Aunt    Diabetes Maternal Aunt    Scheduled Meds:  heparin   5,000 Units Subcutaneous Q8H   icosapent  Ethyl  2 g Oral BID   [START ON 01/02/2025] levothyroxine   50 mcg Oral Q0600   OLANZapine   10 mg Oral BID   timolol   1 drop Both Eyes BID   traZODone   150 mg Oral QHS   Continuous Infusions: PRN Meds:.acetaminophen , ondansetron  (ZOFRAN ) IV, mouth rinse, polyethylene glycol Medications Prior to Admission:  Prior to Admission medications  Medication Sig Start Date End Date Taking? Authorizing Provider  acetaminophen  (TYLENOL ) 500 MG tablet Take 500 mg by mouth every 12 (twelve) hours as needed for mild pain (pain score 1-3).   Yes [provider]  amLODipine (NORVASC) 10 MG tablet Take 10 mg by mouth daily.   Yes [provider]  atorvastatin (LIPITOR) 10 MG tablet Take 10 mg by mouth every evening.   Yes [provider]  carboxymethylcellul-glycerin (REFRESH RELIEVA) 0.5-0.9 % ophthalmic solution Place 2 drops into both eyes 2 (two) times daily.   Yes [provider]  clotrimazole -betamethasone  (LOTRISONE ) cream Apply 1 Application topically as needed. 01/03/23  Yes [provider]  ferrous sulfate 325 (65 FE) MG tablet Take 325 mg by mouth daily.   Yes [provider]  FLUoxetine  (PROZAC ) 20 MG capsule Take 20 mg by mouth daily. 12/04/24  Yes [provider]  isosorbide mononitrate (IMDUR) 30 MG 24 hr tablet Take 30 mg by mouth daily.   Yes [provider]  levothyroxine  (SYNTHROID , LEVOTHROID) 50 MCG tablet TAKE 1 TABLET BY MOUTH EVERY MORNING 01/17/17  Yes Joshua Cathryne BROCKS, MD  losartan (COZAAR) 50 MG tablet Take 50 mg by mouth daily. 12/04/24  Yes [provider]   Multiple Vitamins-Minerals (MULTIVITAMIN WITH MINERALS) tablet TAKE 1 TABLET BY MOUTH DAILY 01/17/17  Yes Jones, Deanna C, MD  OLANZapine  (ZYPREXA ) 10 MG tablet Take 10 mg by mouth 2 (two) times daily. 12/04/24  Yes [provider]  propranolol (INDERAL) 20 MG tablet Take 20 mg by mouth 2 (two) times daily. 12/04/24  Yes [provider]  sevelamer carbonate (RENVELA) 800 MG tablet Take 800 mg by mouth 3 (three) times daily with meals. 03/21/24 04/25/26 Yes [provider]  Skin Protectants, Misc. (MINERIN CREME) CREA Apply 1 Application topically every 6 (six) hours as needed.   Yes [provider]  timolol  (TIMOPTIC ) 0.5 % ophthalmic solution Place 1 drop into both eyes 2 (two) times daily.   Yes [provider]  torsemide (DEMADEX) 20 MG tablet Take 80 mg by mouth daily. 12/27/24  Yes [provider]  traZODone  (DESYREL ) 150 MG tablet Take 150 mg by mouth at bedtime.   Yes [provider]  VASCEPA  1 g capsule Take 2 g by mouth 2 (two) times daily.   Yes [provider]  acetaminophen  (TYLENOL ) 325 MG tablet Take 650 mg by mouth every 6 (six) hours as needed. Patient not taking: Reported on 12/31/2024    [provider]  b complex vitamins tablet Take 1 tablet by mouth daily. Patient not taking: Reported on 12/31/2024    [provider]  bimatoprost  (LUMIGAN ) 0.01 % SOLN  Place 1 drop into both eyes at bedtime. Patient not taking: Reported on 12/31/2024 06/27/14   Nicholaus Leita LABOR, NP  bisacodyl (DULCOLAX) 5 MG EC tablet Take 10 mg by mouth daily as needed for moderate constipation. Patient not taking: Reported on 12/31/2024    [provider]  brimonidine -timolol  (COMBIGAN ) 0.2-0.5 % ophthalmic solution Place 1 drop into both eyes 2 (two) times daily.  Patient not taking: Reported on 12/31/2024    [provider]  clonazePAM  (KLONOPIN ) 1 MG tablet Take 1 tablet (1 mg total) by mouth at bedtime. Patient not  taking: Reported on 12/31/2024 10/22/16   Hower, Alm POUR, MD  divalproex  (DEPAKOTE  ER) 250 MG 24 hr tablet Take 750 mg by mouth 2 (two) times daily. Patient not taking: Reported on 12/31/2024    [provider]  divalproex  (DEPAKOTE  ER) 500 MG 24 hr tablet Take 2 tablets (1,000 mg total) by mouth at bedtime. Patient not taking: Reported on 12/31/2024 06/27/14   Nicholaus Leita A, NP  labetalol  (NORMODYNE ) 100 MG tablet TAKE 1 TABLET BY MOUTH TWICE A DAY Patient not taking: Reported on 12/31/2024 01/17/17   Joshua Cathryne BROCKS, MD  latanoprost  (XALATAN ) 0.005 % ophthalmic solution Place 1 drop into both eyes at bedtime. Patient not taking: Reported on 12/31/2024    [provider]  pyridOXINE  (VITAMIN B-6) 50 MG tablet TAKE 2 TABLETS BY MOUTH TWICE A DAY Patient not taking: Reported on 12/31/2024 12/24/16   Joshua Cathryne BROCKS, MD  simvastatin  (ZOCOR ) 20 MG tablet TAKE 1 TABLET BY MOUTH EVERY EVENING AT AT BEDTIME Patient not taking: Reported on 12/31/2024 01/17/17   Joshua Cathryne BROCKS, MD  sulfamethoxazole -trimethoprim  (BACTRIM  DS) 800-160 MG tablet Take 1 tablet by mouth 2 (two) times daily. Patient not taking: Reported on 02/08/2024 10/01/22   Tobie Franky SQUIBB, DPM  terbinafine  (LAMISIL ) 250 MG tablet Take 1 tablet (250 mg total) by mouth daily. Patient not taking: Reported on 12/31/2024 10/01/22   Tobie Franky SQUIBB, DPM  traZODone  (DESYREL ) 100 MG tablet Take 1 tablet (100 mg total) by mouth at bedtime. Patient not taking: Reported on 12/31/2024 10/22/16   Hower, Alm POUR, MD   Allergies[1] Review of Systems  All other systems reviewed and are negative.   Physical Exam Pulmonary:     Effort: Pulmonary effort is normal.  Skin:    General: Skin is warm and dry.  Neurological:     Mental Status: He is alert.     Vital Signs: BP (!) 132/90 (BP Location: Right Arm)   Pulse 79   Temp 98.2 F (36.8 C) (Axillary)   Resp 20   Ht 6' 3 (1.905 m)   Wt 99.8 kg   SpO2 99%   BMI 27.50 kg/m  Pain Scale: 0-10    Pain Score: 0-No pain   SpO2: SpO2: 99 % O2 Device:SpO2: 99 % O2 Flow Rate: .   IO: Intake/output summary:  Intake/Output Summary (Last 24 hours) at 01/01/2025 1423 Last data filed at 01/01/2025 1103 Gross per 24 hour  Intake 1360.83 ml  Output 250 ml  Net 1110.83 ml    LBM: Last BM Date : 12/31/24 Baseline Weight: Weight: 99.8 kg Most recent weight: Weight: 99.8 kg      Time In: 1:30 Time Out: 2:30 Time Total: 60 min Greater than 50%  of this time was spent counseling and coordinating care related to the above assessment and plan.  Signed by: Camelia Lewis, NP   Please contact Palliative Medicine Team phone  at [phone] for questions and concerns.  For individual provider: See Amion                 [1]  Allergies Allergen Reactions   Tetracyclines & Related Hives   "

## 2025-01-01 NOTE — Plan of Care (Signed)

## 2025-01-01 NOTE — Progress Notes (Signed)
" °  PROGRESS NOTE    Nor[employer].  FMW:[id number] DOB: 1956-06-18 DOA: 12/31/2024 PCP: Donal Channing SQUIBB, FNP  240A/240A-BB  LOS: 1 day   Brief hospital course:   Assessment & Plan: Brandon ONEIDA Damita Mickey. is a 69 y.o. male with medical history significant of CKD, psychiatric disorders who was brought in by group home staff for leg swelling and shortness of breath with exertion.      # Hyperkalemia --due to ESRD.  Received Ca gluconate and lokelma , D5 and insulin , IV lasix . --HD to correct   # Approaching ESRD # Metabolic acidosis --worsening Cr to 8.33 with hyperkalemia and signs of fluid overload.  Pt now agreeable to starting dialysis. --consult nephro --start HD today   # Fluid overload --CXR with pulm edema and small bilateral pleural effusions, although pt is not hypoxic and no dyspnea at rest. --started on IV lasix  80 mg --start HD today   # acute on chronic anemia likely due to chronic kidney disease --1u pRBC for Hgb 6.7 --monitor and transfuse PRN   # Bradycardia --EKG with sinus and 1st degree block.  Asymptomatic.  Not on rate control agent. --monitor for now   DVT prophylaxis: Heparin  SQ Code Status: DNR  Family Communication:  Level of care: Progressive Dispo:   The patient is from: group home Anticipated d/c is to: group home Anticipated d/c date is: > 3 days   Subjective and Interval History:  Pt denied dyspnea.  Reported leg swelling improved.   Objective: Vitals:   01/01/25 1700 01/01/25 1715 01/01/25 1730 01/01/25 1819  BP: 130/62 124/67 121/67 (!) 135/59  Pulse: (!) 51 (!) 51 64 (!) 59  Resp: 11 15 16 16   Temp:  (!) 97.4 F (36.3 C)  97.8 F (36.6 C)  TempSrc:  Axillary    SpO2: 97% 97% 96% 94%  Weight:  115.7 kg    Height:        Intake/Output Summary (Last 24 hours) at 01/01/2025 1859 Last data filed at 01/01/2025 1715 Gross per 24 hour  Intake 1360.83 ml  Output 250 ml  Net 1110.83 ml   Filed Weights   01/01/25 1331 01/01/25 1500  01/01/25 1715  Weight: 115.8 kg 115.8 kg 115.7 kg    Examination:   Constitutional: NAD, alert, oriented HEENT: conjunctivae and lids normal, EOMI CV: No cyanosis.   RESP: normal respiratory effort, on RA Extremities: tense edema in BLE SKIN: warm, dry Neuro: II - XII grossly intact.     Data Reviewed: I have personally reviewed labs and imaging studies  Time spent: 50 minutes  Brandon Haber, MD [facility] If 7PM-7AM, please contact night-coverage 01/01/2025, 6:59 PM   "

## 2025-01-02 DIAGNOSIS — R0609 Other forms of dyspnea: Secondary | ICD-10-CM | POA: Diagnosis not present

## 2025-01-02 LAB — HIV ANTIBODY (ROUTINE TESTING W REFLEX): HIV Screen 4th Generation wRfx: NONREACTIVE

## 2025-01-02 LAB — POTASSIUM: Potassium: 4.9 mmol/L (ref 3.5–5.1)

## 2025-01-02 LAB — HEPATITIS B SURFACE ANTIBODY, QUANTITATIVE: Hep B S AB Quant (Post): <3.5 m[IU]/mL — ABNORMAL LOW

## 2025-01-02 NOTE — Progress Notes (Addendum)
 "                                                                                                                                                                                                          Daily Progress Note   Patient Name: Brandon Maynard.       Date: 01/02/2025 DOB: May 21, 1956  Age: 69 y.o. MRN#: [id number] Attending Physician: Awanda City, MD Primary Care Physician: Donal Channing SQUIBB, FNP Admit Date: 12/31/2024  Reason for Consultation/Follow-up: Establishing goals of care  Subjective: Epic chat received that patient is now declining dialysis.  Spoke with nephrology.  Notes and labs reviewed.    In to see patient.  Patient states that he just spoke with his brother and he is planning to continue dialysis and shares if I do not do dialysis I will not be able to go to the store.    We discussed his thoughts and feelings in depth.  He states going to the store is very important to him.  He states being in his current group home is important to him.  He states he worries about how long he will be able to live there either way.  He states he has stated before this admission that he did not want dialysis. This was documented in [facility].  He states he does not want to continue.  Discussed various scenarios.  He understands that without dialysis he will die.  Upon discussing a plan of focusingon being comfortable, he sounds to be amenable to this.  Called and spoke with patient's brother [name] who is HPOA.  Discussed that patient is stating he does not want dialysis.  Patient's brother shared that this was unexpected, as he states he talked to the patient not long before I did, and patient stated he was continuing on with plan for dialysis.  Patient's brother is H POA, but states he wants to honor patient's wishes.  As patient continues to vacillate on this decision, PMT will plan to follow-up tomorrow morning to provide time for patient to fully assess his wishes,and will plan to speak  further regarding comfort care if he maintains a desire not to proceed with dialysis.  Will then plan to follow-up with patient's brother.  Team updated.   Length of Stay: 2  Current Medications: Scheduled Meds:   heparin   5,000 Units Subcutaneous Q8H   icosapent  Ethyl  2 g Oral BID   levothyroxine   50 mcg Oral Q0600   OLANZapine   10 mg Oral BID   timolol   1  drop Both Eyes BID   traZODone   150 mg Oral QHS    Continuous Infusions:   PRN Meds: acetaminophen , ondansetron  (ZOFRAN ) IV, mouth rinse, polyethylene glycol  Physical Exam Pulmonary:     Effort: Pulmonary effort is normal.  Skin:    General: Skin is warm and dry.  Neurological:     Mental Status: He is alert.             Vital Signs: BP 132/83 (BP Location: Right Arm)   Pulse 76   Temp 98 F (36.7 C) (Oral)   Resp 19   Ht 6' 3 (1.905 m)   Wt 115.7 kg   SpO2 93%   BMI 31.88 kg/m  SpO2: SpO2: 93 % O2 Device: O2 Device: Room Air O2 Flow Rate:    Intake/output summary:  Intake/Output Summary (Last 24 hours) at 01/02/2025 1209 Last data filed at 01/02/2025 9187 Gross per 24 hour  Intake 960 ml  Output 950 ml  Net 10 ml   LBM: Last BM Date : 12/31/24 Baseline Weight: Weight: 99.8 kg Most recent weight: Weight: 115.7 kg    Patient Active Problem List   Diagnosis Date Noted   Dyspnea 12/31/2024   Pain due to onychomycosis of toenails of both feet 11/02/2021   Hypertension 03/14/2017   Acute delirium 10/05/2016   Left leg cellulitis 10/04/2016   B12 deficiency 07/06/2016   Anemia 06/16/2016   Thrombocytopenia 06/16/2016   Weight loss 06/16/2016   Esophageal reflux 05/12/2016   DDD (degenerative disc disease), 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  Code Status:    Code Status Orders  (From admission, onward)           Start      Ordered   12/31/24 1845  Do not attempt resuscitation (DNR) Pre-Arrest Interventions Desired  Continuous       Question Answer Comment  If pulseless and not breathing No CPR or chest compressions.   In Pre-Arrest Conditions (Patient Has Pulse and Is Breathing) May intubate, use advanced airway interventions and cardioversion/ACLS medications if appropriate or indicated. May transfer to ICU.   Consent: Discussion documented in EHR or advanced directives reviewed      12/31/24 1844           Code Status History     Date Active Date Inactive Code Status Order ID Comments User Context   10/04/2016 2010 10/22/2016 1953 Full Code [id number]  Andreas Nissen, MD Inpatient   06/13/2014 1841 06/28/2014 1532 Full Code [id number]  Elner Orion, NP Inpatient   06/12/2014 1707 06/13/2014 1841 Full Code [id number]  Rabon Pfeiffer, PA-C ED       Thank you for allowing the Palliative Medicine Team to assist in the care of this patient.   Time In: 10:00 Time Out: 10:50 Total Time 50 min Prolonged Time Billed  no       Greater than 50%  of this time was spent counseling and coordinating care related to the above assessment and plan.  Camelia Lewis, NP  Please contact Palliative Medicine Team phone at [phone] for questions and concerns.       "

## 2025-01-02 NOTE — Progress Notes (Signed)
 Skin checked. No pressure injuries noted to buttock. Bruise noted to right outer thigh and left inner thigh. Pictures added to chart. Patient able to turn and reposition without assistance. No distress noted.

## 2025-01-02 NOTE — Progress Notes (Signed)
 " [facility]  ROUNDING NOTE   Subjective:   Patient seen sitting up in bed Remains on room air Denies pain States he does not want to continue dialysis Ask appropriate questions about what stopping dialysis would look like  All questions answered.   Objective:  Vital signs in last 24 hours:  Temp:  [97.4 F (36.3 C)-98.4 F (36.9 C)] 98 F (36.7 C) (02/04 1235) Pulse Rate:  [51-76] 54 (02/04 1235) Resp:  [11-19] 19 (02/04 0336) BP: (104-135)/(50-83) 117/60 (02/04 1235) SpO2:  [92 %-99 %] 99 % (02/04 1235) Weight:  [115.7 kg] 115.7 kg (02/03 1715)  Weight change: 16 kg Filed Weights   01/01/25 1331 01/01/25 1500 01/01/25 1715  Weight: 115.8 kg 115.8 kg 115.7 kg    Intake/Output: I/O last 3 completed shifts: In: 2080.8 [P.O.:1540; I.V.:40.8; Blood:500] Out: 550 [Urine:550]   Intake/Output this shift:  Total I/O In: 360 [P.O.:360] Out: 950 [Urine:950]  Physical Exam: General: NAD  Head: Normocephalic, atraumatic. Moist oral mucosal membranes  Eyes: Anicteric  Neck: Supple  Lungs:  Clear to auscultation  Heart: Regular rate and rhythm  Abdomen:  Soft, nontender  Extremities:  +++peripheral edema.  Neurologic: Awake, alert, conversant  Skin: Warm,dry, no rash  Access: Lt AVF    Basic Metabolic Panel: Recent Labs  Lab 12/31/24 1528 12/31/24 1619 12/31/24 2343 01/01/25 0415 01/02/25 0425  NA 140 140  --  141  --   K 6.5* 6.3* 5.8* 5.6* 4.9  CL 110 111  --  111  --   CO2 16* 15*  --  16*  --   GLUCOSE 86 83  --  80  --   BUN 107* 108*  --  108*  --   CREATININE 8.02* 8.33*  --  8.40*  --   CALCIUM  8.0* 8.2*  --  7.9*  --   MG  --  3.2*  --  3.3*  --     Liver Function Tests: Recent Labs  Lab 12/31/24 1619  AST 24  ALT 28  ALKPHOS 94  BILITOT 0.3  PROT 6.0*  ALBUMIN 3.5   No results for input(s): LIPASE, AMYLASE in the last 168 hours. No results for input(s): AMMONIA in the last 168 hours.  CBC: Recent Labs  Lab  12/31/24 1528 01/01/25 0415  WBC 6.8  --   HGB 6.7* 6.7*  HCT 22.3*  --   MCV 101.4*  --   PLT 58*  --     Cardiac Enzymes: No results for input(s): CKTOTAL, CKMB, CKMBINDEX, TROPONINI in the last 168 hours.  BNP: Invalid input(s): POCBNP  CBG: Recent Labs  Lab 12/31/24 1848  GLUCAP 95    Microbiology: Results for orders placed or performed during the hospital encounter of 10/04/16  Blood Culture (routine x 2)     Status: Abnormal   Collection Time: 10/04/16  3:51 PM   Specimen: BLOOD  Result Value Ref Range Status   Specimen Description BLOOD RT [facility]  Final   Special Requests BOTTLES DRAWN AEROBIC AND ANAEROBIC 10CC  Final   Culture  Setup Time   Final    Organism ID to follow GRAM NEGATIVE RODS IN BOTH AEROBIC AND ANAEROBIC BOTTLES CRITICAL RESULT CALLED TO, READ BACK BY AND VERIFIED WITH: NATE COOKSON ON 10/05/16 AT 0448 BY TLB CONFIRMED BY TLB    Culture ESCHERICHIA COLI (A)  Final   Report Status 10/07/2016 FINAL  Final   Organism ID, Bacteria ESCHERICHIA COLI  Final  Susceptibility   Escherichia coli - MIC*    AMPICILLIN  >=32 RESISTANT Resistant     CEFAZOLIN  <=4 SENSITIVE Sensitive     CEFEPIME <=1 SENSITIVE Sensitive     CEFTAZIDIME <=1 SENSITIVE Sensitive     CEFTRIAXONE  <=1 SENSITIVE Sensitive     CIPROFLOXACIN <=0.25 SENSITIVE Sensitive     GENTAMICIN <=1 SENSITIVE Sensitive     IMIPENEM <=0.25 SENSITIVE Sensitive     TRIMETH /SULFA  >=320 RESISTANT Resistant     AMPICILLIN /SULBACTAM 16 INTERMEDIATE Intermediate     PIP/TAZO <=4 SENSITIVE Sensitive     Extended ESBL NEGATIVE Sensitive     * ESCHERICHIA COLI  Blood Culture ID Panel (Reflexed)     Status: Abnormal   Collection Time: 10/04/16  3:51 PM  Result Value Ref Range Status   Enterococcus species NOT DETECTED NOT DETECTED Final   Listeria monocytogenes NOT DETECTED NOT DETECTED Final   Staphylococcus species NOT DETECTED NOT DETECTED Final   Staphylococcus aureus (BCID) NOT  DETECTED NOT DETECTED Final   Streptococcus species NOT DETECTED NOT DETECTED Final   Streptococcus agalactiae NOT DETECTED NOT DETECTED Final   Streptococcus pneumoniae NOT DETECTED NOT DETECTED Final   Streptococcus pyogenes NOT DETECTED NOT DETECTED Final   Acinetobacter baumannii NOT DETECTED NOT DETECTED Final   Enterobacteriaceae species DETECTED (A) NOT DETECTED Final    Comment: CRITICAL RESULT CALLED TO, READ BACK BY AND VERIFIED WITH: NATE COOKSON ON 10/05/16 AT 0448 BY TLB    Enterobacter cloacae complex NOT DETECTED NOT DETECTED Final   Escherichia coli DETECTED (A) NOT DETECTED Final    Comment: CRITICAL RESULT CALLED TO, READ BACK BY AND VERIFIED WITH: NATE COOKSON ON 10/05/16 AT 0448 BY TLB                                                                                                Klebsiella oxytoca NOT DETECTED NOT DETECTED Final   Klebsiella pneumoniae NOT DETECTED NOT DETECTED Final   Proteus species NOT DETECTED NOT DETECTED Final   Serratia marcescens NOT DETECTED NOT DETECTED Final   Carbapenem resistance NOT DETECTED NOT DETECTED Final   Haemophilus influenzae NOT DETECTED NOT DETECTED Final   Neisseria meningitidis NOT DETECTED NOT DETECTED Final   Pseudomonas aeruginosa NOT DETECTED NOT DETECTED Final   Candida albicans NOT DETECTED NOT DETECTED Final   Candida glabrata NOT DETECTED NOT DETECTED Final   Candida krusei NOT DETECTED NOT DETECTED Final   Candida parapsilosis NOT DETECTED NOT DETECTED Final   Candida tropicalis NOT DETECTED NOT DETECTED Final  Blood Culture (routine x 2)     Status: Abnormal   Collection Time: 10/04/16  3:56 PM   Specimen: BLOOD  Result Value Ref Range Status   Specimen Description BLOOD LT [facility]  Final   Special Requests BOTTLES DRAWN AEROBIC AND ANAEROBIC 10CC  Final   Culture  Setup Time   Final    GRAM NEGATIVE RODS IN BOTH AEROBIC AND ANAEROBIC BOTTLES CRITICAL RESULT CALLED TO, READ BACK BY AND VERIFIED WITH: NATE COOKSON  ON 10/05/16 AT 0448 BY TLB CONFIRMED BY TLB    Culture ESCHERICHIA  COLI (A)  Final   Report Status 10/07/2016 FINAL  Final  Urine culture     Status: Abnormal   Collection Time: 10/04/16  9:01 PM   Specimen: Urine, Random  Result Value Ref Range Status   Specimen Description URINE, RANDOM  Final   Special Requests NONE  Final   Culture 30,000 COLONIES/mL ESCHERICHIA COLI (A)  Final   Report Status 10/07/2016 FINAL  Final   Organism ID, Bacteria ESCHERICHIA COLI (A)  Final      Susceptibility   Escherichia coli - MIC*    AMPICILLIN  >=32 RESISTANT Resistant     CEFAZOLIN  <=4 SENSITIVE Sensitive     CEFTRIAXONE  <=1 SENSITIVE Sensitive     CIPROFLOXACIN <=0.25 SENSITIVE Sensitive     GENTAMICIN <=1 SENSITIVE Sensitive     IMIPENEM <=0.25 SENSITIVE Sensitive     NITROFURANTOIN <=16 SENSITIVE Sensitive     TRIMETH /SULFA  >=320 RESISTANT Resistant     AMPICILLIN /SULBACTAM 16 INTERMEDIATE Intermediate     PIP/TAZO <=4 SENSITIVE Sensitive     Extended ESBL NEGATIVE Sensitive     * 30,000 COLONIES/mL ESCHERICHIA COLI  MRSA PCR Screening     Status: None   Collection Time: 10/04/16  9:01 PM   Specimen: Nasal Mucosa; Nasopharyngeal  Result Value Ref Range Status   MRSA by PCR NEGATIVE NEGATIVE Final    Comment:        The GeneXpert MRSA Assay (FDA approved for NASAL specimens only), is one component of a comprehensive MRSA colonization surveillance program. It is not intended to diagnose MRSA infection nor to guide or monitor treatment for MRSA infections.   MRSA PCR Screening     Status: None   Collection Time: 10/06/16 11:59 AM   Specimen: Nasal Mucosa; Nasopharyngeal  Result Value Ref Range Status   MRSA by PCR NEGATIVE NEGATIVE Final    Comment:        The GeneXpert MRSA Assay (FDA approved for NASAL specimens only), is one component of a comprehensive MRSA colonization surveillance program. It is not intended to diagnose MRSA infection nor to guide or monitor treatment  for MRSA infections.   Aerobic Culture  (superficial specimen)     Status: None   Collection Time: 10/11/16  4:03 PM   Specimen: Leg; Wound  Result Value Ref Range Status   Specimen Description LEG  Final   Special Requests Normal  Final   Gram Stain NO WBC SEEN NO ORGANISMS SEEN   Final   Culture   Final    NO GROWTH 3 DAYS Performed at [facility]    Report Status 10/14/2016 FINAL  Final  CULTURE, BLOOD (ROUTINE X 2) w Reflex to ID Panel     Status: None   Collection Time: 10/13/16  9:33 AM   Specimen: BLOOD  Result Value Ref Range Status   Specimen Description BLOOD RIGHT HAND  Final   Special Requests   Final    BOTTLES DRAWN AEROBIC AND ANAEROBIC 8CCAERO,10CCANA   Culture NO GROWTH 5 DAYS  Final   Report Status 10/18/2016 FINAL  Final  CULTURE, BLOOD (ROUTINE X 2) w Reflex to ID Panel     Status: None   Collection Time: 10/13/16  9:40 AM   Specimen: BLOOD  Result Value Ref Range Status   Specimen Description BLOOD LEFT ASSIST CONTROL  Final   Special Requests   Final    BOTTLES DRAWN AEROBIC AND ANAEROBIC 10CCAERO,5CCANA   Culture NO GROWTH 5 DAYS  Final   Report  Status 10/18/2016 FINAL  Final    Coagulation Studies: No results for input(s): LABPROT, INR in the last 72 hours.  Urinalysis: No results for input(s): COLORURINE, LABSPEC, PHURINE, GLUCOSEU, HGBUR, BILIRUBINUR, KETONESUR, PROTEINUR, UROBILINOGEN, NITRITE, LEUKOCYTESUR in the last 72 hours.  Invalid input(s): APPERANCEUR    Imaging: DG Chest 2 View Result Date: 12/31/2024 CLINICAL DATA:  Bilateral lower extremity swelling for 2 weeks EXAM: CHEST - 2 VIEW COMPARISON:  10/17/2016 FINDINGS: Frontal and lateral views of the chest demonstrate an unremarkable cardiac silhouette. There is bibasilar consolidation which may reflect airspace disease or atelectasis. Small bilateral pleural effusions are suspected. No pneumothorax. No acute bony abnormalities. IMPRESSION: 1.  Bibasilar consolidation and small bilateral pleural effusions, which could be related to pulmonary edema given clinical history. Underlying infection would be difficult to exclude. Electronically Signed   By: Ozell Daring M.D.   On: 12/31/2024 15:53     Medications:     heparin   5,000 Units Subcutaneous Q8H   icosapent  Ethyl  2 g Oral BID   levothyroxine   50 mcg Oral Q0600   OLANZapine   10 mg Oral BID   timolol   1 drop Both Eyes BID   traZODone   150 mg Oral QHS   acetaminophen , ondansetron  (ZOFRAN ) IV, mouth rinse, polyethylene glycol  Assessment/ Plan:  Mr. Brandon [facility]. is a 69 y.o.  male  with psychiatric disorders and chronic kidney disease stage V , who was admitted to [facility] on 12/31/2024 for Hyperkalemia [E87.5] Bradycardia [R00.1] Leg edema [R60.0] Dyspnea [R06.00] Admission for palliative care [Z51.5] Anemia, unspecified type [D64.9]   Acute kidney injury with hyperkalemia on chronic kidney disease stage V. Baseline creatinine 6.94 with GFR 8 on 12/19/24. Followed by [facility]. Appears to be progression of kidney disease that now requires dialysis. Potassium 6.5, corrected with shifting measures.  Patient agreed to proceed with dialysis yesterday, Did receive 2 hour treatment yesterday with no fluid removed. Was able to use AVF, no issues. When speaking today, patient stated he did not want to continue dialysis. He asked appropriate questions about ending dialysis and the effects. All questions answered to the best of my ability, patient would like to proceed with hospice. Will speak with patient's legal guardian.  Appreciate palliative involvement.   Lab Results  Component Value Date   CREATININE 8.40 (H) 01/01/2025   CREATININE 8.33 (H) 12/31/2024   CREATININE 8.02 (H) 12/31/2024    Intake/Output Summary (Last 24 hours) at 01/02/2025 1510 Last data filed at 01/02/2025 1442 Gross per 24 hour  Intake 1080 ml  Output 1250 ml  Net -170 ml   2. Volume overload, chest xray shows  pulmonary edema with bilateral pleural effusions. Prescribed furosemide  80mg . Continue IV furosemide .   3. Anemia of chronic kidney disease  Lab Results  Component Value Date   HGB 6.7 (L) 01/01/2025    Hgb decreased, will defer blood transfusion to primary team. Can consider weekly retacrit     4. Secondary Hyperparathyroidism: with outpatient labs: PTH 391, phosphorus 6.9, calcium  8.3 on 12/19/24.   Lab Results  Component Value Date   PTH 45 10/09/2016   CALCIUM  7.9 (L) 01/01/2025   PHOS 4.9 (H) 10/21/2016    Bone minerals acceptable.  . 5.  Hypertension with chronic kidney disease.  Home regimen includes amlodipine, isosorbide, labetalol , losartan, and torsemide.    LOS: 2 Tykeisha Peer 2/4/20263:10 PM   "

## 2025-01-02 NOTE — Progress Notes (Signed)
" °  PROGRESS NOTE    [facility].  FMW:[id number] DOB: 24-Jan-1956 DOA: 12/31/2024 PCP: Donal Channing SQUIBB, FNP  240A/240A-BB  LOS: 2 days   Brief hospital course:   Assessment & Plan: Brandon [facility]. is a 69 y.o. male with medical history significant of CKD, psychiatric disorders who was brought in by [facility] staff for leg swelling and shortness of breath with exertion.      # Hyperkalemia --due to ESRD.  Received Ca gluconate and lokelma , D5 and insulin , IV lasix .  Received 1 session of dialysis.  Potassium level normal this morning, however, pt has no again decided he doesn't want dialysis. --monitor potassium for now pending further discussion.   # Approaching ESRD # Metabolic acidosis --worsening Cr to 8.33 with hyperkalemia and signs of fluid overload.  Pt has been going back and forth about whether he will receive dialysis. --consulted nephro.  Had one session of HD via left arm fistula, tolerated it well.  Currently refusing further HD. --ongoing discussion   # Fluid overload --CXR with pulm edema and small bilateral pleural effusions, although pt is not hypoxic and no dyspnea at rest.  # acute on chronic anemia likely due to chronic kidney disease --1u pRBC for Hgb 6.7.   --further transfusion pending further goals of care discussion.   # Bradycardia --EKG with sinus and 1st degree block.  Asymptomatic.  Not on rate control agent. --monitor for now   DVT prophylaxis: Heparin  SQ Code Status: DNR  Family Communication:  Level of care: Progressive Dispo:   The patient is from: [facility] Anticipated d/c is to: undetermined Anticipated d/c date is: > 3 days   Subjective and Interval History:  Pt received HD via left arm fistula, tolerated it well.    However, today, pt again announced that he doesn't want dialysis.     Objective: Vitals:   01/02/25 0336 01/02/25 0812 01/02/25 1235 01/02/25 1708  BP: 127/66 132/83 117/60 130/61  Pulse: 62 76 (!) 54 61  Resp:  19   18  Temp: (!) 97.5 F (36.4 C) 98 F (36.7 C) 98 F (36.7 C) (!) 97.5 F (36.4 C)  TempSrc:  Oral Oral   SpO2: 92% 93% 99% 96%  Weight:      Height:        Intake/Output Summary (Last 24 hours) at 01/02/2025 1810 Last data filed at 01/02/2025 1710 Gross per 24 hour  Intake 1080 ml  Output 2000 ml  Net -920 ml   Filed Weights   01/01/25 1331 01/01/25 1500 01/01/25 1715  Weight: 115.8 kg 115.8 kg 115.7 kg    Examination:   Constitutional: NAD CV: No cyanosis.   RESP: normal respiratory effort, on RA   Data Reviewed: I have personally reviewed labs and imaging studies  Time spent: 35 minutes  Brandon Haber, MD [facility] If 7PM-7AM, please contact night-coverage 01/02/2025, 6:10 PM   "

## 2025-01-02 NOTE — Plan of Care (Signed)
  Problem: Education: Goal: Knowledge of General Education information will improve Description: Including pain rating scale, medication(s)/side effects and non-pharmacologic comfort measures Outcome: Progressing   Problem: Health Behavior/Discharge Planning: Goal: Ability to manage health-related needs will improve Outcome: Progressing   Problem: Clinical Measurements: Goal: Ability to maintain clinical measurements within normal limits will improve Outcome: Progressing Goal: Will remain free from infection Outcome: Progressing Goal: Diagnostic test results will improve Outcome: Progressing Goal: Respiratory complications will improve Outcome: Progressing Goal: Cardiovascular complication will be avoided Outcome: Progressing   Problem: Activity: Goal: Risk for activity intolerance will decrease Outcome: Progressing   Problem: Nutrition: Goal: Adequate nutrition will be maintained Outcome: Progressing   Problem: Coping: Goal: Level of anxiety will decrease Outcome: Progressing   Problem: Elimination: Goal: Will not experience complications related to bowel motility Outcome: Progressing Goal: Will not experience complications related to urinary retention Outcome: Progressing   Problem: Pain Managment: Goal: General experience of comfort will improve and/or be controlled Outcome: Progressing   Problem: Safety: Goal: Ability to remain free from injury will improve Outcome: Progressing   Problem: Skin Integrity: Goal: Risk for impaired skin integrity will decrease Outcome: Progressing   Problem: Education: Goal: Knowledge of disease and its progression will improve Outcome: Progressing   Problem: Health Behavior/Discharge Planning: Goal: Ability to manage health-related needs will improve Outcome: Progressing   Problem: Clinical Measurements: Goal: Complications related to the disease process or treatment will be avoided or minimized Outcome:  Progressing Goal: Dialysis access will remain free of complications Outcome: Progressing   Problem: Activity: Goal: Activity intolerance will improve Outcome: Progressing   Problem: Fluid Volume: Goal: Fluid volume balance will be maintained or improved Outcome: Progressing   Problem: Nutritional: Goal: Ability to make appropriate dietary choices will improve Outcome: Progressing   Problem: Respiratory: Goal: Respiratory symptoms related to disease process will be avoided Outcome: Progressing   Problem: Self-Concept: Goal: Body image disturbance will be avoided or minimized Outcome: Progressing   Problem: Urinary Elimination: Goal: Progression of disease will be identified and treated Outcome: Progressing

## 2025-01-03 DIAGNOSIS — R0609 Other forms of dyspnea: Secondary | ICD-10-CM | POA: Diagnosis not present

## 2025-01-03 LAB — HEPATITIS B CORE ANTIBODY, TOTAL: HEP B CORE AB: NEGATIVE

## 2025-01-03 NOTE — Discharge Summary (Signed)
 "  Physician Discharge Summary   Brandon Maynard.  male DOB: [birth year]  FMW:[id number]  PCP: Donal Channing SQUIBB, FNP  Admit date: 12/31/2024 Discharge date: 01/03/2025  Admitted From: group home Disposition:  group home with hospice CODE STATUS: DNR  Discharge Instructions     Diet renal with fluid restriction   Complete by: As directed       Hospital Course:  For full details, please see H&P, progress notes, consult notes and ancillary notes.  Briefly,  Brandon Maynard. is a 69 y.o. male with medical history significant of CKDV, psychiatric disorders who was brought in by group home staff for leg swelling and shortness of breath with exertion.     # Comfort care and hospice --pt is progressing towards ESRD and in need of dialysis, however, after multiple discussions amongst pt, providers, palliative care providers, brother (legal guardian), pt is firm on not wanting to receive dialysis.  Pt has refused medications and treatments, and just wants to return to group home with hospice.   # Hyperkalemia --due to ESRD.  Received Ca gluconate and lokelma , D5 and insulin , IV lasix .  Received 1 session of dialysis.  Potassium level normal next morning, however, pt has then again decided he doesn't want dialysis.  # CKD V # Approaching ESRD # Metabolic acidosis --worsening Cr to 8.33 with hyperkalemia and signs of fluid overload.  Pt has been going back and forth about whether he will receive dialysis. --consulted nephro.  Had one session of HD via left arm fistula, tolerated it well.  Next day, pt refused further HD.   # Fluid overload --CXR with pulm edema and small bilateral pleural effusions, although pt is not hypoxic and no dyspnea at rest.  Expect condition to worsen. --cont home torsemide   # acute on chronic anemia likely due to chronic kidney disease --received 1u pRBC for Hgb 6.7.     # Bradycardia --EKG with sinus and 1st degree block.  Asymptomatic.  Not on rate control  agent.  # Hx of HTN, not currently active --pt not taking labetalol  PTA --discontinue home amlodipine, Imdur, losartan, propranolol due to soft BP. --cont home torsemide.  Psychiatric disorders --has Bipolar and paranoid schizophrenia in his PMH. --cont home Prozac , Zyprexa  and trazodone  --not taking Klonopin , Depakote  PTA  HLD --not taking Simvastatin  PTA. --cont home Lipitor   Unless noted above, medications under STOP list are ones pt was not taking PTA.  Discharge Diagnoses:  Principal Problem:   Dyspnea   30 Day Unplanned Readmission Risk Score    Flowsheet Row ED to Hosp-Admission (Current) from 12/31/2024 in [facility]  30 Day Unplanned Readmission Risk Score (%) 22.29 Filed at 01/03/2025 1600    This score is the patient's risk of an unplanned readmission within 30 days of being discharged (0 -100%). The score is based on dignosis, age, lab data, medications, orders, and past utilization.   Low:  0-14.9   Medium: 15-21.9   High: 22-29.9   Extreme: 30 and above         Discharge Instructions:  Allergies as of 01/03/2025       Reactions   Tetracyclines & Related Hives        Medication List     STOP taking these medications    amLODipine 10 MG tablet Commonly known as: NORVASC   b complex vitamins tablet   bimatoprost  0.01 % Soln Commonly known as: Lumigan    bisacodyl 5 MG  EC tablet Commonly known as: DULCOLAX   clonazePAM  1 MG tablet Commonly known as: KLONOPIN    Combigan  0.2-0.5 % ophthalmic solution Generic drug: brimonidine -timolol    divalproex  250 MG 24 hr tablet Commonly known as: DEPAKOTE  ER   divalproex  500 MG 24 hr tablet Commonly known as: DEPAKOTE  ER   isosorbide mononitrate 30 MG 24 hr tablet Commonly known as: IMDUR   labetalol  100 MG tablet Commonly known as: NORMODYNE    latanoprost  0.005 % ophthalmic solution Commonly known as: XALATAN    losartan 50 MG tablet Commonly known as: COZAAR    propranolol 20 MG tablet Commonly known as: INDERAL   pyridOXINE  50 MG tablet Commonly known as: VITAMIN B6   simvastatin  20 MG tablet Commonly known as: ZOCOR    sulfamethoxazole -trimethoprim  800-160 MG tablet Commonly known as: BACTRIM  DS   terbinafine  250 MG tablet Commonly known as: LamISIL        TAKE these medications    acetaminophen  500 MG tablet Commonly known as: TYLENOL  Take 500 mg by mouth every 12 (twelve) hours as needed for mild pain (pain score 1-3). What changed: Another medication with the same name was removed. Continue taking this medication, and follow the directions you see here.   atorvastatin 10 MG tablet Commonly known as: LIPITOR Take 10 mg by mouth every evening.   clotrimazole -betamethasone  cream Commonly known as: LOTRISONE  Apply 1 Application topically as needed.   ferrous sulfate 325 (65 FE) MG tablet Take 325 mg by mouth daily.   FLUoxetine  20 MG capsule Commonly known as: PROZAC  Take 20 mg by mouth daily.   levothyroxine  50 MCG tablet Commonly known as: SYNTHROID  TAKE 1 TABLET BY MOUTH EVERY MORNING   Minerin Creme Crea Apply 1 Application topically every 6 (six) hours as needed.   multivitamin with minerals tablet TAKE 1 TABLET BY MOUTH DAILY   OLANZapine  10 MG tablet Commonly known as: ZYPREXA  Take 10 mg by mouth 2 (two) times daily.   Refresh Relieva 0.5-0.9 % ophthalmic solution Generic drug: carboxymethylcellul-glycerin Place 2 drops into both eyes 2 (two) times daily.   sevelamer carbonate 800 MG tablet Commonly known as: RENVELA Take 800 mg by mouth 3 (three) times daily with meals.   timolol  0.5 % ophthalmic solution Commonly known as: TIMOPTIC  Place 1 drop into both eyes 2 (two) times daily.   torsemide 20 MG tablet Commonly known as: DEMADEX Take 80 mg by mouth daily.   traZODone  150 MG tablet Commonly known as: DESYREL  Take 150 mg by mouth at bedtime. What changed: Another medication with the same  name was removed. Continue taking this medication, and follow the directions you see here.   Vascepa  1 g capsule Generic drug: icosapent  Ethyl Take 2 g by mouth 2 (two) times daily.          Allergies[1]   The results of significant diagnostics from this hospitalization (including imaging, microbiology, ancillary and laboratory) are listed below for reference.   Consultations:   Procedures/Studies: DG Chest 2 View Result Date: 12/31/2024 CLINICAL DATA:  Bilateral lower extremity swelling for 2 weeks EXAM: CHEST - 2 VIEW COMPARISON:  10/17/2016 FINDINGS: Frontal and lateral views of the chest demonstrate an unremarkable cardiac silhouette. There is bibasilar consolidation which may reflect airspace disease or atelectasis. Small bilateral pleural effusions are suspected. No pneumothorax. No acute bony abnormalities. IMPRESSION: 1. Bibasilar consolidation and small bilateral pleural effusions, which could be related to pulmonary edema given clinical history. Underlying infection would be difficult to exclude. Electronically Signed   By: Ozell  Delores M.D.   On: 12/31/2024 15:53      Labs: BNP (last 3 results) No results for input(s): BNP in the last 8760 hours. Basic Metabolic Panel: Recent Labs  Lab 12/31/24 1528 12/31/24 1619 12/31/24 2343 01/01/25 0415 01/02/25 0425  NA 140 140  --  141  --   K 6.5* 6.3* 5.8* 5.6* 4.9  CL 110 111  --  111  --   CO2 16* 15*  --  16*  --   GLUCOSE 86 83  --  80  --   BUN 107* 108*  --  108*  --   CREATININE 8.02* 8.33*  --  8.40*  --   CALCIUM  8.0* 8.2*  --  7.9*  --   MG  --  3.2*  --  3.3*  --    Liver Function Tests: Recent Labs  Lab 12/31/24 1619  AST 24  ALT 28  ALKPHOS 94  BILITOT 0.3  PROT 6.0*  ALBUMIN 3.5   No results for input(s): LIPASE, AMYLASE in the last 168 hours. No results for input(s): AMMONIA in the last 168 hours. CBC: Recent Labs  Lab 12/31/24 1528 01/01/25 0415  WBC 6.8  --   HGB 6.7* 6.7*   HCT 22.3*  --   MCV 101.4*  --   PLT 58*  --    Cardiac Enzymes: No results for input(s): CKTOTAL, CKMB, CKMBINDEX, TROPONINI in the last 168 hours. BNP: Invalid input(s): POCBNP CBG: Recent Labs  Lab 12/31/24 1848  GLUCAP 95   D-Dimer No results for input(s): DDIMER in the last 72 hours. Hgb A1c No results for input(s): HGBA1C in the last 72 hours. Lipid Profile No results for input(s): CHOL, HDL, LDLCALC, TRIG, CHOLHDL, LDLDIRECT in the last 72 hours. Thyroid  function studies No results for input(s): TSH, T4TOTAL, T3FREE, THYROIDAB in the last 72 hours.  Invalid input(s): FREET3 Anemia work up No results for input(s): VITAMINB12, FOLATE, FERRITIN, TIBC, IRON , RETICCTPCT in the last 72 hours. Urinalysis    Component Value Date/Time   COLORURINE YELLOW (A) 10/04/2016 2101   APPEARANCEUR HAZY (A) 10/04/2016 2101   LABSPEC 1.005 10/04/2016 2101   PHURINE 6.0 10/04/2016 2101   GLUCOSEU NEGATIVE 10/04/2016 2101   HGBUR 2+ (A) 10/04/2016 2101   BILIRUBINUR NEGATIVE 10/04/2016 2101   KETONESUR NEGATIVE 10/04/2016 2101   PROTEINUR 100 (A) 10/04/2016 2101   NITRITE NEGATIVE 10/04/2016 2101   LEUKOCYTESUR 3+ (A) 10/04/2016 2101   Sepsis Labs Recent Labs  Lab 12/31/24 1528  WBC 6.8   Microbiology No results found for this or any previous visit (from the past 240 hours).   Total time spend on discharging this patient, including the last patient exam, discussing the hospital stay, instructions for ongoing care as it relates to all pertinent caregivers, as well as preparing the medical discharge records, prescriptions, and/or referrals as applicable, is 60 minutes.    Ellouise Haber, MD  [facility] 01/03/2025, 4:23 PM       [1]  Allergies Allergen Reactions   Tetracyclines & Related Hives   "

## 2025-01-03 NOTE — Progress Notes (Addendum)
 This AM, patient refused vitals and morning shift assessment. Patient is still alert and oriented x 4. MD made aware.

## 2025-01-03 NOTE — Progress Notes (Signed)
 " [facility]  ROUNDING NOTE   Subjective:   Patient seen resting in bed Alert States he has seen multiple medical providers. Continues to state he wants no further hemodialysis  Objective:  Vital signs in last 24 hours:  Temp:  [97.5 F (36.4 C)-98 F (36.7 C)] 98 F (36.7 C) (02/05 0401) Pulse Rate:  [61-64] 64 (02/05 0401) Resp:  [18-19] 19 (02/05 0401) BP: (129-143)/(54-61) 129/54 (02/05 0401) SpO2:  [94 %-96 %] 94 % (02/05 0401)  Weight change:  Filed Weights   01/01/25 1331 01/01/25 1500 01/01/25 1715  Weight: 115.8 kg 115.8 kg 115.7 kg    Intake/Output: I/O last 3 completed shifts: In: 1350 [P.O.:1350] Out: 3550 [Urine:3550]   Intake/Output this shift:  Total I/O In: 360 [P.O.:360] Out: 1000 [Urine:1000]  Physical Exam: General: NAD  Head: Normocephalic, atraumatic. Moist oral mucosal membranes  Eyes: Anicteric  Lungs:  Clear to auscultation  Heart: Regular rate and rhythm  Abdomen:  Soft, nontender  Extremities:  +++peripheral edema.  Neurologic: Awake, alert, conversant  Skin: Warm,dry, no rash  Access: Lt AVF    Basic Metabolic Panel: Recent Labs  Lab 12/31/24 1528 12/31/24 1619 12/31/24 2343 01/01/25 0415 01/02/25 0425  NA 140 140  --  141  --   K 6.5* 6.3* 5.8* 5.6* 4.9  CL 110 111  --  111  --   CO2 16* 15*  --  16*  --   GLUCOSE 86 83  --  80  --   BUN 107* 108*  --  108*  --   CREATININE 8.02* 8.33*  --  8.40*  --   CALCIUM  8.0* 8.2*  --  7.9*  --   MG  --  3.2*  --  3.3*  --     Liver Function Tests: Recent Labs  Lab 12/31/24 1619  AST 24  ALT 28  ALKPHOS 94  BILITOT 0.3  PROT 6.0*  ALBUMIN 3.5   No results for input(s): LIPASE, AMYLASE in the last 168 hours. No results for input(s): AMMONIA in the last 168 hours.  CBC: Recent Labs  Lab 12/31/24 1528 01/01/25 0415  WBC 6.8  --   HGB 6.7* 6.7*  HCT 22.3*  --   MCV 101.4*  --   PLT 58*  --     Cardiac Enzymes: No results for input(s):  CKTOTAL, CKMB, CKMBINDEX, TROPONINI in the last 168 hours.  BNP: Invalid input(s): POCBNP  CBG: Recent Labs  Lab 12/31/24 1848  GLUCAP 95    Microbiology: Results for orders placed or performed during the hospital encounter of 10/04/16  Blood Culture (routine x 2)     Status: Abnormal   Collection Time: 10/04/16  3:51 PM   Specimen: BLOOD  Result Value Ref Range Status   Specimen Description BLOOD RT [facility]  Final   Special Requests BOTTLES DRAWN AEROBIC AND ANAEROBIC 10CC  Final   Culture  Setup Time   Final    Organism ID to follow GRAM NEGATIVE RODS IN BOTH AEROBIC AND ANAEROBIC BOTTLES CRITICAL RESULT CALLED TO, READ BACK BY AND VERIFIED WITH: NATE COOKSON ON 10/05/16 AT 0448 BY TLB CONFIRMED BY TLB    Culture ESCHERICHIA COLI (A)  Final   Report Status 10/07/2016 FINAL  Final   Organism ID, Bacteria ESCHERICHIA COLI  Final      Susceptibility   Escherichia coli - MIC*    AMPICILLIN  >=32 RESISTANT Resistant     CEFAZOLIN  <=4 SENSITIVE Sensitive  CEFEPIME <=1 SENSITIVE Sensitive     CEFTAZIDIME <=1 SENSITIVE Sensitive     CEFTRIAXONE  <=1 SENSITIVE Sensitive     CIPROFLOXACIN <=0.25 SENSITIVE Sensitive     GENTAMICIN <=1 SENSITIVE Sensitive     IMIPENEM <=0.25 SENSITIVE Sensitive     TRIMETH /SULFA  >=320 RESISTANT Resistant     AMPICILLIN /SULBACTAM 16 INTERMEDIATE Intermediate     PIP/TAZO <=4 SENSITIVE Sensitive     Extended ESBL NEGATIVE Sensitive     * ESCHERICHIA COLI  Blood Culture ID Panel (Reflexed)     Status: Abnormal   Collection Time: 10/04/16  3:51 PM  Result Value Ref Range Status   Enterococcus species NOT DETECTED NOT DETECTED Final   Listeria monocytogenes NOT DETECTED NOT DETECTED Final   Staphylococcus species NOT DETECTED NOT DETECTED Final   Staphylococcus aureus (BCID) NOT DETECTED NOT DETECTED Final   Streptococcus species NOT DETECTED NOT DETECTED Final   Streptococcus agalactiae NOT DETECTED NOT DETECTED Final   Streptococcus  pneumoniae NOT DETECTED NOT DETECTED Final   Streptococcus pyogenes NOT DETECTED NOT DETECTED Final   Acinetobacter baumannii NOT DETECTED NOT DETECTED Final   Enterobacteriaceae species DETECTED (A) NOT DETECTED Final    Comment: CRITICAL RESULT CALLED TO, READ BACK BY AND VERIFIED WITH: NATE COOKSON ON 10/05/16 AT 0448 BY TLB    Enterobacter cloacae complex NOT DETECTED NOT DETECTED Final   Escherichia coli DETECTED (A) NOT DETECTED Final    Comment: CRITICAL RESULT CALLED TO, READ BACK BY AND VERIFIED WITH: NATE COOKSON ON 10/05/16 AT 0448 BY TLB                                                                                                Klebsiella oxytoca NOT DETECTED NOT DETECTED Final   Klebsiella pneumoniae NOT DETECTED NOT DETECTED Final   Proteus species NOT DETECTED NOT DETECTED Final   Serratia marcescens NOT DETECTED NOT DETECTED Final   Carbapenem resistance NOT DETECTED NOT DETECTED Final   Haemophilus influenzae NOT DETECTED NOT DETECTED Final   Neisseria meningitidis NOT DETECTED NOT DETECTED Final   Pseudomonas aeruginosa NOT DETECTED NOT DETECTED Final   Candida albicans NOT DETECTED NOT DETECTED Final   Candida glabrata NOT DETECTED NOT DETECTED Final   Candida krusei NOT DETECTED NOT DETECTED Final   Candida parapsilosis NOT DETECTED NOT DETECTED Final   Candida tropicalis NOT DETECTED NOT DETECTED Final  Blood Culture (routine x 2)     Status: Abnormal   Collection Time: 10/04/16  3:56 PM   Specimen: BLOOD  Result Value Ref Range Status   Specimen Description BLOOD LT [facility]  Final   Special Requests BOTTLES DRAWN AEROBIC AND ANAEROBIC 10CC  Final   Culture  Setup Time   Final    GRAM NEGATIVE RODS IN BOTH AEROBIC AND ANAEROBIC BOTTLES CRITICAL RESULT CALLED TO, READ BACK BY AND VERIFIED WITH: NATE COOKSON ON 10/05/16 AT 0448 BY TLB CONFIRMED BY TLB    Culture ESCHERICHIA COLI (A)  Final   Report Status 10/07/2016 FINAL  Final  Urine culture     Status:  Abnormal   Collection Time: 10/04/16  9:01 PM   Specimen: Urine, Random  Result Value Ref Range Status   Specimen Description URINE, RANDOM  Final   Special Requests NONE  Final   Culture 30,000 COLONIES/mL ESCHERICHIA COLI (A)  Final   Report Status 10/07/2016 FINAL  Final   Organism ID, Bacteria ESCHERICHIA COLI (A)  Final      Susceptibility   Escherichia coli - MIC*    AMPICILLIN  >=32 RESISTANT Resistant     CEFAZOLIN  <=4 SENSITIVE Sensitive     CEFTRIAXONE  <=1 SENSITIVE Sensitive     CIPROFLOXACIN <=0.25 SENSITIVE Sensitive     GENTAMICIN <=1 SENSITIVE Sensitive     IMIPENEM <=0.25 SENSITIVE Sensitive     NITROFURANTOIN <=16 SENSITIVE Sensitive     TRIMETH /SULFA  >=320 RESISTANT Resistant     AMPICILLIN /SULBACTAM 16 INTERMEDIATE Intermediate     PIP/TAZO <=4 SENSITIVE Sensitive     Extended ESBL NEGATIVE Sensitive     * 30,000 COLONIES/mL ESCHERICHIA COLI  MRSA PCR Screening     Status: None   Collection Time: 10/04/16  9:01 PM   Specimen: Nasal Mucosa; Nasopharyngeal  Result Value Ref Range Status   MRSA by PCR NEGATIVE NEGATIVE Final    Comment:        The GeneXpert MRSA Assay (FDA approved for NASAL specimens only), is one component of a comprehensive MRSA colonization surveillance program. It is not intended to diagnose MRSA infection nor to guide or monitor treatment for MRSA infections.   MRSA PCR Screening     Status: None   Collection Time: 10/06/16 11:59 AM   Specimen: Nasal Mucosa; Nasopharyngeal  Result Value Ref Range Status   MRSA by PCR NEGATIVE NEGATIVE Final    Comment:        The GeneXpert MRSA Assay (FDA approved for NASAL specimens only), is one component of a comprehensive MRSA colonization surveillance program. It is not intended to diagnose MRSA infection nor to guide or monitor treatment for MRSA infections.   Aerobic Culture  (superficial specimen)     Status: None   Collection Time: 10/11/16  4:03 PM   Specimen: Leg; Wound  Result  Value Ref Range Status   Specimen Description LEG  Final   Special Requests Normal  Final   Gram Stain NO WBC SEEN NO ORGANISMS SEEN   Final   Culture   Final    NO GROWTH 3 DAYS Performed at [facility]    Report Status 10/14/2016 FINAL  Final  CULTURE, BLOOD (ROUTINE X 2) w Reflex to ID Panel     Status: None   Collection Time: 10/13/16  9:33 AM   Specimen: BLOOD  Result Value Ref Range Status   Specimen Description BLOOD RIGHT HAND  Final   Special Requests   Final    BOTTLES DRAWN AEROBIC AND ANAEROBIC 8CCAERO,10CCANA   Culture NO GROWTH 5 DAYS  Final   Report Status 10/18/2016 FINAL  Final  CULTURE, BLOOD (ROUTINE X 2) w Reflex to ID Panel     Status: None   Collection Time: 10/13/16  9:40 AM   Specimen: BLOOD  Result Value Ref Range Status   Specimen Description BLOOD LEFT ASSIST CONTROL  Final   Special Requests   Final    BOTTLES DRAWN AEROBIC AND ANAEROBIC 10CCAERO,5CCANA   Culture NO GROWTH 5 DAYS  Final   Report Status 10/18/2016 FINAL  Final    Coagulation Studies: No results for input(s): LABPROT, INR in the last 72 hours.  Urinalysis: No results for input(s):  COLORURINE, LABSPEC, PHURINE, GLUCOSEU, HGBUR, BILIRUBINUR, KETONESUR, PROTEINUR, UROBILINOGEN, NITRITE, LEUKOCYTESUR in the last 72 hours.  Invalid input(s): APPERANCEUR    Imaging: No results found.    Medications:     heparin   5,000 Units Subcutaneous Q8H   icosapent  Ethyl  2 g Oral BID   levothyroxine   50 mcg Oral Q0600   OLANZapine   10 mg Oral BID   timolol   1 drop Both Eyes BID   traZODone   150 mg Oral QHS   acetaminophen , ondansetron  (ZOFRAN ) IV, mouth rinse, polyethylene glycol  Assessment/ Plan:  Mr. Brandon Maynard. is a 69 y.o.  male  with psychiatric disorders and chronic kidney disease stage V , who was admitted to [facility] on 12/31/2024 for Hyperkalemia [E87.5] Bradycardia [R00.1] Leg edema [R60.0] Dyspnea [R06.00] Admission for palliative care  [Z51.5] Anemia, unspecified type [D64.9]   Acute kidney injury with hyperkalemia on chronic kidney disease stage V. Baseline creatinine 6.94 with GFR 8 on 12/19/24. Followed by [facility]. Appears to be progression of kidney disease that now requires dialysis. Potassium 6.5, corrected with shifting measures.  Patient received 1 hemodialysis treatment and decided to and further treatments of.  Attempted to call legal guardian without answer.  Appreciate palliative care speaking with patient and legal guardian.  Will defer further treatments.  Lab Results  Component Value Date   CREATININE 8.40 (H) 01/01/2025   CREATININE 8.33 (H) 12/31/2024   CREATININE 8.02 (H) 12/31/2024    Intake/Output Summary (Last 24 hours) at 01/03/2025 1403 Last data filed at 01/03/2025 1033 Gross per 24 hour  Intake 750 ml  Output 3300 ml  Net -2550 ml   2. Volume overload, chest xray shows pulmonary edema with bilateral pleural effusions. Prescribed furosemide  80mg .  Can continue IV furosemide  will transition to oral for fluid management.  3. Anemia of chronic kidney disease  Lab Results  Component Value Date   HGB 6.7 (L) 01/01/2025    Hgb decreased, will defer blood transfusion to primary team.     4. Secondary Hyperparathyroidism: with outpatient labs: PTH 391, phosphorus 6.9, calcium  8.3 on 12/19/24.   Lab Results  Component Value Date   PTH 45 10/09/2016   CALCIUM  7.9 (L) 01/01/2025   PHOS 4.9 (H) 10/21/2016    Serum calcium  slightly decreased. . 5.  Hypertension with chronic kidney disease.  Home regimen includes amlodipine, isosorbide, labetalol , losartan, and torsemide.  All antihypertensives held.  We will sign off at this time as patient does not wish to proceed with hemodialysis.  We have nothing further to offer.   LOS: 3 Taiten Brawn 2/5/20262:03 PM   "

## 2025-01-03 NOTE — Care Management Important Message (Signed)
 Important Message  Patient Details  Name: Brandon Maynard. MRN: [id number] Date of Birth: [birth year]   Important Message Given:  Yes - Medicare IM     [name] 01/03/2025, 11:43 AM

## 2025-01-03 NOTE — Progress Notes (Signed)
 Patient requested for right AC PIV to be removed. MD okay.

## 2025-01-03 NOTE — Progress Notes (Signed)
 Patient refused repositioning / mobility activity.

## 2025-01-03 NOTE — Progress Notes (Signed)
 "                                                                                                                                                                                                          Daily Progress Note   Patient Name: Brandon Maynard.       Date: 01/03/2025 DOB: 1956/02/13  Age: 69 y.o. MRN#: [id number] Attending Physician: Awanda City, MD Primary Care Physician: Donal Channing SQUIBB, FNP Admit Date: 12/31/2024  Reason for Consultation/Follow-up: Establishing goals of care  Subjective: Notes reviewed.  Epic chat as per nursing the patient declined vital signs and morning medications.  In to see patient.  He states he does not want further dialysis, and inquires when his IVs can be removed.  He states he is aware that this means he is unable to return to his group home.  He voices understanding that without dialysis he will die.  We discussed comfort care.  Called to speak with patient's brother [name] who is legal guardian.  States he spoke with his brother yesterday and is aware of his wishes.  He shares that he understands he cannot force patient to take care if he does not want it.  He voices that he wishes patient could return to his group home with hospice care.  After discussing plans moving forward with patient, I completed a MOST form today through [employer] and the patient and his legal guardian where he also outlined their wishes for the following treatment decisions:  Cardiopulmonary Resuscitation: Do Not Attempt Resuscitation (DNR/No CPR)  Medical Interventions: Comfort Measures: Keep clean, warm, and dry. Use medication by any route, positioning, wound care, and other measures to relieve pain and suffering. Use oxygen, suction and manual treatment of airway obstruction as needed for comfort. Do not transfer to the hospital unless comfort needs cannot be met in current location.  Antibiotics: No antibiotics (use other measures to relieve symptoms)  IV Fluids: No IV fluids  (provide other measures to ensure comfort)  Feeding Tube: No feeding tube     Ongoing epic chat with TOC, attending, and nursing regarding care moving forward.    Length of Stay: 3  Current Medications: Scheduled Meds:   heparin   5,000 Units Subcutaneous Q8H   icosapent  Ethyl  2 g Oral BID   levothyroxine   50 mcg Oral Q0600   OLANZapine   10 mg Oral BID   timolol   1 drop Both Eyes BID   traZODone   150 mg Oral QHS    Continuous Infusions:   PRN Meds: acetaminophen ,  ondansetron  (ZOFRAN ) IV, mouth rinse, polyethylene glycol  Physical Exam Pulmonary:     Effort: Pulmonary effort is normal.  Skin:    General: Skin is warm and dry.  Neurological:     Mental Status: He is alert.             Vital Signs: BP (!) 129/54 (BP Location: Right Arm)   Pulse 64   Temp 98 F (36.7 C) (Oral)   Resp 19   Ht 6' 3 (1.905 m)   Wt 115.7 kg   SpO2 94%   BMI 31.88 kg/m  SpO2: SpO2: 94 % O2 Device: O2 Device: Room Air O2 Flow Rate:    Intake/output summary:  Intake/Output Summary (Last 24 hours) at 01/03/2025 1154 Last data filed at 01/03/2025 1033 Gross per 24 hour  Intake 750 ml  Output 3600 ml  Net -2850 ml   LBM: Last BM Date : 01/02/25 Baseline Weight: Weight: 99.8 kg Most recent weight: Weight: 115.7 kg        Patient Active Problem List   Diagnosis Date Noted   Dyspnea 12/31/2024   Pain due to onychomycosis of toenails of both feet 11/02/2021   Hypertension 03/14/2017   Acute delirium 10/05/2016   Left leg cellulitis 10/04/2016   B12 deficiency 07/06/2016   Anemia 06/16/2016   Thrombocytopenia 06/16/2016   Weight loss 06/16/2016   Esophageal reflux 05/12/2016   DDD (degenerative disc disease), 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   Code Status:    Code Status Orders  (From admission,  onward)           Start     Ordered   01/03/25 1123  Do not attempt resuscitation (DNR) - Comfort care  (Code Status)  Continuous       Question Answer Comment  If patient has no pulse and is not breathing Do Not Attempt Resuscitation   In Pre-Arrest Conditions (Patient Is Breathing and Has a Pulse) Provide comfort measures. Relieve any mechanical airway obstruction. Avoid transfer unless required for comfort.   Consent: Discussion documented in EHR or advanced directives reviewed      01/03/25 1122           Code Status History     Date Active Date Inactive Code Status Order ID Comments User Context   12/31/2024 1844 01/03/2025 1122 Do not attempt resuscitation (DNR) PRE-ARREST INTERVENTIONS DESIRED [id number]  Awanda City, MD ED   10/04/2016 2010 10/22/2016 1953 Full Code [id number]  Andreas Nissen, MD Inpatient   06/13/2014 1841 06/28/2014 1532 Full Code [id number]  Elner Orion, NP Inpatient   06/12/2014 1707 06/13/2014 1841 Full Code [id number]  Rabon Pfeiffer, PA-C ED       Thank you for allowing the Palliative Medicine Team to assist in the care of this patient.   Time In: 10:30 Time Out: 11:30 Total Time 60 min Prolonged Time Billed  no       Greater than 50%  of this time was spent counseling and coordinating care related to the above assessment and plan.  Camelia Lewis, NP  Please contact Palliative Medicine Team phone at [phone] for questions and concerns.       "

## 2025-01-03 NOTE — TOC Transition Note (Addendum)
 Transition of Care [facility]) - Discharge Note   Patient Details  Name: Brandon Maynard. MRN: [id number] Date of Birth: July 24, 1956  Transition of Care [facility]) CM/SW Contact:  Shasta DELENA Daring, RN Phone Number: 01/03/2025, 4:18 PM   Clinical Narrative:    Readmission risk complete. Patient is discharging to group home with [facility] to await placement in hospice facility.  Group home, [facility], providing transportation.  [facility] [address] [place], [place] [address]  [phone]  [facility] notified of discharge. They will follow patient on outpatient basis  No additional TOC needs  RNCM signing off     Barriers to Discharge: Barriers Resolved   Patient Goals and CMS Choice            Discharge Placement                Patient to be transferred to facility by: Group home Name of family member notified: Richard Witcher Patient and family notified of of transfer: 01/03/25  Discharge Plan and Services Additional resources added to the After Visit Summary for                                       Social Drivers of Health (SDOH) Interventions SDOH Screenings   Food Insecurity: No Food Insecurity (01/01/2025)  Housing: Low Risk (01/01/2025)  Transportation Needs: No Transportation Needs (01/01/2025)  Utilities: Not At Risk (01/01/2025)  Social Connections: Socially Isolated (01/01/2025)  Tobacco Use: Medium Risk (12/31/2024)     Readmission Risk Interventions    01/03/2025    4:17 PM  Readmission Risk Prevention Plan  Transportation Screening Complete  PCP or Specialist Appt within 3-5 Days Complete  HRI or Home Care Consult Patient refused  Palliative Care Screening Not Applicable  Medication Review (RN Care Manager) Complete

## 2025-01-03 NOTE — [place] FL2 (Signed)
 "   MEDICAID [id number] LEVEL OF CARE FORM     IDENTIFICATION  Patient Name: Brandon Maynard. Birthdate: 02-05-1956 Sex: male Admission Date (Current Location): 12/31/2024  [facility] [place] Number:  Chiropodist and Address:  [facility], [address] [place], [place] [address]      Provider Number: [id number]  Attending Physician Name and Address:  Awanda City, MD  Relative Name and Phone Number:  [phone]    Current Level of Care: Hospital Recommended Level of Care:  (group home) Prior Approval Number:    Date Approved/Denied:   PASRR Number:    Discharge Plan: Other (Comment) (group home)    Current Diagnoses: Patient Active Problem List   Diagnosis Date Noted   Dyspnea 12/31/2024   Pain due to onychomycosis of toenails of both feet 11/02/2021   Hypertension 03/14/2017   Acute delirium 10/05/2016   Left leg cellulitis 10/04/2016   B12 deficiency 07/06/2016   Anemia 06/16/2016   Thrombocytopenia 06/16/2016   Weight loss 06/16/2016   Esophageal reflux 05/12/2016   DDD (degenerative disc disease), 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   Psychotic disorder (HCC) 06/13/2014   Schizoaffective disorder (HCC) 06/13/2014    Orientation RESPIRATION BLADDER Height & Weight     Self, Time  Normal Continent Weight: 115.7 kg Height:  6' 3 (190.5 cm)  BEHAVIORAL SYMPTOMS/MOOD NEUROLOGICAL BOWEL NUTRITION STATUS      Continent Diet (regular)  AMBULATORY STATUS COMMUNICATION OF NEEDS Skin     Verbally Normal                       Personal Care Assistance Level of Assistance              Functional Limitations Info             SPECIAL CARE FACTORS FREQUENCY                       Contractures Contractures Info: Not present    Additional Factors Info  Code Status, Allergies Code Status Info: DNR Allergies Info: Tetracyclines & Related            Discharge Medications: TAKE these medications     acetaminophen   500 MG tablet Commonly known as: TYLENOL  Take 500 mg by mouth every 12 (twelve) hours as needed for mild pain (pain score 1-3). What changed: Another medication with the same name was removed. Continue taking this medication, and follow the directions you see here.    atorvastatin 10 MG tablet Commonly known as: LIPITOR Take 10 mg by mouth every evening.    clotrimazole -betamethasone  cream Commonly known as: LOTRISONE  Apply 1 Application topically as needed.    ferrous sulfate 325 (65 FE) MG tablet Take 325 mg by mouth daily.    FLUoxetine  20 MG capsule Commonly known as: PROZAC  Take 20 mg by mouth daily.    levothyroxine  50 MCG tablet Commonly known as: SYNTHROID  TAKE 1 TABLET BY MOUTH EVERY MORNING    Minerin Creme Crea Apply 1 Application topically every 6 (six) hours as needed.    multivitamin with minerals tablet TAKE 1 TABLET BY MOUTH DAILY    OLANZapine  10 MG tablet Commonly known as: ZYPREXA  Take 10 mg by mouth 2 (two) times daily.    Refresh Relieva 0.5-0.9 % ophthalmic solution Generic drug: carboxymethylcellul-glycerin Place 2 drops into both eyes 2 (two) times daily.    sevelamer carbonate 800 MG tablet Commonly known as: RENVELA Take  800 mg by mouth 3 (three) times daily with meals.    timolol  0.5 % ophthalmic solution Commonly known as: TIMOPTIC  Place 1 drop into both eyes 2 (two) times daily.    torsemide 20 MG tablet Commonly known as: DEMADEX Take 80 mg by mouth daily.    traZODone  150 MG tablet Commonly known as: DESYREL  Take 150 mg by mouth at bedtime. What changed: Another medication with the same name was removed. Continue taking this medication, and follow the directions you see here.    Vascepa  1 g capsule Generic drug: icosapent  Ethyl Take 2 g by mouth 2 (two) times daily.           Relevant Imaging Results:  Relevant Lab Results:   Additional Information SS#: [id number]  Shasta DELENA Daring, RN     "

## 2025-01-03 NOTE — Progress Notes (Signed)
Patient refused morning medications.  MD aware.

## 2025-01-10 ENCOUNTER — Ambulatory Visit: Admitting: Podiatry
